# Patient Record
Sex: Female | Born: 1955 | Race: Black or African American | Hispanic: No | Marital: Married | Smoking: Light tobacco smoker
Health system: Southern US, Community
[De-identification: ages and names within clinical notes are randomized; demographics above are authoritative.]

## PROBLEM LIST (undated history)

## (undated) DIAGNOSIS — D649 Anemia, unspecified: Secondary | ICD-10-CM

## (undated) DIAGNOSIS — I5189 Other ill-defined heart diseases: Secondary | ICD-10-CM

## (undated) DIAGNOSIS — F32A Depression, unspecified: Secondary | ICD-10-CM

## (undated) DIAGNOSIS — K760 Fatty (change of) liver, not elsewhere classified: Secondary | ICD-10-CM

## (undated) DIAGNOSIS — I350 Nonrheumatic aortic (valve) stenosis: Secondary | ICD-10-CM

## (undated) DIAGNOSIS — E785 Hyperlipidemia, unspecified: Secondary | ICD-10-CM

## (undated) DIAGNOSIS — I493 Ventricular premature depolarization: Secondary | ICD-10-CM

## (undated) DIAGNOSIS — K219 Gastro-esophageal reflux disease without esophagitis: Secondary | ICD-10-CM

## (undated) DIAGNOSIS — F329 Major depressive disorder, single episode, unspecified: Secondary | ICD-10-CM

## (undated) DIAGNOSIS — Z720 Tobacco use: Secondary | ICD-10-CM

## (undated) DIAGNOSIS — I10 Essential (primary) hypertension: Secondary | ICD-10-CM

## (undated) DIAGNOSIS — M1990 Unspecified osteoarthritis, unspecified site: Secondary | ICD-10-CM

## (undated) DIAGNOSIS — I351 Nonrheumatic aortic (valve) insufficiency: Secondary | ICD-10-CM

## (undated) DIAGNOSIS — E079 Disorder of thyroid, unspecified: Secondary | ICD-10-CM

## (undated) DIAGNOSIS — Z800 Family history of malignant neoplasm of digestive organs: Secondary | ICD-10-CM

## (undated) DIAGNOSIS — I491 Atrial premature depolarization: Secondary | ICD-10-CM

## (undated) DIAGNOSIS — G459 Transient cerebral ischemic attack, unspecified: Secondary | ICD-10-CM

## (undated) HISTORY — DX: Essential (primary) hypertension: I10

## (undated) HISTORY — DX: Hyperlipidemia, unspecified: E78.5

## (undated) HISTORY — DX: Depression, unspecified: F32.A

## (undated) HISTORY — DX: Family history of malignant neoplasm of digestive organs: Z80.0

## (undated) HISTORY — PX: FOOT SURGERY: SHX648

## (undated) HISTORY — DX: Transient cerebral ischemic attack, unspecified: G45.9

## (undated) HISTORY — DX: Disorder of thyroid, unspecified: E07.9

## (undated) HISTORY — DX: Unspecified osteoarthritis, unspecified site: M19.90

## (undated) HISTORY — DX: Major depressive disorder, single episode, unspecified: F32.9

## (undated) HISTORY — DX: Gastro-esophageal reflux disease without esophagitis: K21.9

## (undated) HISTORY — DX: Anemia, unspecified: D64.9

## (undated) HISTORY — PX: DILATION AND CURETTAGE OF UTERUS: SHX78

---

## 2010-09-13 ENCOUNTER — Emergency Department (HOSPITAL_COMMUNITY): Admission: EM | Admit: 2010-09-13 | Discharge: 2010-09-13 | Payer: Self-pay | Admitting: Family Medicine

## 2011-07-25 ENCOUNTER — Encounter: Payer: Self-pay | Admitting: Internal Medicine

## 2011-07-25 ENCOUNTER — Ambulatory Visit (INDEPENDENT_AMBULATORY_CARE_PROVIDER_SITE_OTHER): Payer: Self-pay | Admitting: Internal Medicine

## 2011-07-25 DIAGNOSIS — K219 Gastro-esophageal reflux disease without esophagitis: Secondary | ICD-10-CM | POA: Insufficient documentation

## 2011-07-25 DIAGNOSIS — Z23 Encounter for immunization: Secondary | ICD-10-CM

## 2011-07-25 DIAGNOSIS — M542 Cervicalgia: Secondary | ICD-10-CM

## 2011-07-25 DIAGNOSIS — F329 Major depressive disorder, single episode, unspecified: Secondary | ICD-10-CM | POA: Insufficient documentation

## 2011-07-25 DIAGNOSIS — F4321 Adjustment disorder with depressed mood: Secondary | ICD-10-CM | POA: Insufficient documentation

## 2011-07-25 DIAGNOSIS — R05 Cough: Secondary | ICD-10-CM

## 2011-07-25 DIAGNOSIS — I10 Essential (primary) hypertension: Secondary | ICD-10-CM

## 2011-07-25 DIAGNOSIS — F17200 Nicotine dependence, unspecified, uncomplicated: Secondary | ICD-10-CM

## 2011-07-25 DIAGNOSIS — E785 Hyperlipidemia, unspecified: Secondary | ICD-10-CM | POA: Insufficient documentation

## 2011-07-25 DIAGNOSIS — Z720 Tobacco use: Secondary | ICD-10-CM

## 2011-07-25 DIAGNOSIS — F4329 Adjustment disorder with other symptoms: Secondary | ICD-10-CM | POA: Insufficient documentation

## 2011-07-25 LAB — LIPID PANEL
HDL: 37 mg/dL — ABNORMAL LOW (ref >39)
LDL Cholesterol: 89 mg/dL (ref 0–99)
Total CHOL/HDL Ratio: 3.8 Ratio
Triglycerides: 65 mg/dL (ref <150)
VLDL: 13 mg/dL (ref 0–40)

## 2011-07-25 LAB — CBC
HCT: 42.3 % (ref 36.0–46.0)
Hemoglobin: 13.4 g/dL (ref 12.0–15.0)
MCH: 28.8 pg (ref 26.0–34.0)
MCHC: 31.7 g/dL (ref 30.0–36.0)
MCV: 91 fL (ref 78.0–100.0)

## 2011-07-25 NOTE — Assessment & Plan Note (Signed)
New to the clinic. Continue to monitor.  BP Readings from Last 3 Encounters:  07/25/11 128/89    Assessment: Hypertension control:  controlled  Progress toward goals:  at goal Barriers to meeting goals:  no barriers identified  Plan: Hypertension treatment:  continue current medications

## 2011-07-25 NOTE — Assessment & Plan Note (Signed)
Stable.  Continue current treatment

## 2011-07-25 NOTE — Progress Notes (Signed)
  Subjective:    Patient ID: Emma Bonilla, female    DOB: 02-Sep-1956, 55 y.o.   MRN: [id number]  HPI: 55 year old woman with past medical history significant for hypertension, depression is a new patient to the clinic.  She has been doing back and forth from [place] to [place] for last one year and now wants to establish her care, here in [place].  Today she complains of some pain in the neck, located bilaterally symmetrical along the lines of sternocleidomastoid. The pain started around a week ago and she rates her pain 5-6/10. No aggaravating or relieving factors.  She also reports some cough where she is bringing some brownish phlegm. She's not sure if that was blood. She is a current smoker.    Review of Systems  Constitutional: Negative for fever, diaphoresis and fatigue.  HENT: Negative for sore throat, drooling, mouth sores, trouble swallowing, dental problem, postnasal drip and sinus pressure.   Eyes: Negative for visual disturbance.  Respiratory: Positive for cough. Negative for apnea, choking, chest tightness and shortness of breath.   Cardiovascular: Negative for chest pain, palpitations and leg swelling.  Gastrointestinal: Negative for vomiting, abdominal pain, diarrhea and constipation.  Genitourinary: Negative for dysuria, urgency and hematuria.  Musculoskeletal: Negative for back pain and arthralgias.  Neurological: Negative for dizziness and numbness.  Hematological: Negative for adenopathy.  Psychiatric/Behavioral: Negative for agitation.       Objective:   Physical Exam  Constitutional: She is oriented to person, place, and time. She appears well-developed and well-nourished. No distress.  HENT:  Head: Normocephalic and atraumatic.  Mouth/Throat: No oropharyngeal exudate.  Eyes: Conjunctivae and EOM are normal. Pupils are equal, round, and reactive to light.  Neck: Normal range of motion. Neck supple. No JVD present. No tracheal deviation present. No  thyromegaly present.  Cardiovascular: Normal rate, regular rhythm and intact distal pulses.  Exam reveals no gallop and no friction rub.   No murmur heard. Pulmonary/Chest: Effort normal and breath sounds normal. No stridor. No respiratory distress. She has no wheezes. She has no rales. She exhibits no tenderness.  Abdominal: Soft. Bowel sounds are normal. She exhibits no distension and no mass. There is no tenderness. There is no rebound and no guarding.  Musculoskeletal: Normal range of motion. She exhibits no edema and no tenderness.  Lymphadenopathy:    She has no cervical adenopathy.  Neurological: She is alert and oriented to person, place, and time. She has normal reflexes. She displays normal reflexes. No cranial nerve deficit. She exhibits normal muscle tone. Coordination normal.  Skin: Skin is warm. She is not diaphoretic.          Assessment & Plan:

## 2011-07-25 NOTE — Assessment & Plan Note (Signed)
He complains of bilateral leg pain in sternocleidomastoid distribution for one week. I think her pain is most likely musculoskeletal. She was advised to take when necessary pain medications for now.

## 2011-07-25 NOTE — Assessment & Plan Note (Signed)
She was counseled on smoking cessation. Not ready at this time. Will continue that with subsequent visits.

## 2011-07-25 NOTE — Patient Instructions (Signed)
Please schedule a follow up appointment in 1- 2 months. Please take your medicines as prescribed.

## 2011-07-25 NOTE — Assessment & Plan Note (Signed)
She reports having cough with brownish phlegm for last one week. Cannot tell for sure if it was blood. Again this is most likely smoker's cough but  maybe she got some mucosal injury from coughing too hard. She was advised to call the clinic if her symptoms persist or if she notices frank hemoptysis.

## 2011-07-25 NOTE — Assessment & Plan Note (Signed)
Check FLP and CMP

## 2011-07-25 NOTE — Assessment & Plan Note (Signed)
Stable  Continue current regimen  

## 2011-07-26 LAB — COMPLETE METABOLIC PANEL WITH GFR
ALT: 10 U/L (ref 0–35)
AST: 13 U/L (ref 0–37)
Alkaline Phosphatase: 89 U/L (ref 39–117)
CO2: 28 mEq/L (ref 19–32)
Creat: 1.19 mg/dL — ABNORMAL HIGH (ref 0.50–1.10)
Sodium: 142 mEq/L (ref 135–145)
Total Bilirubin: 0.4 mg/dL (ref 0.3–1.2)
Total Protein: 7 g/dL (ref 6.0–8.3)

## 2011-09-01 ENCOUNTER — Ambulatory Visit (INDEPENDENT_AMBULATORY_CARE_PROVIDER_SITE_OTHER): Payer: Self-pay | Admitting: Internal Medicine

## 2011-09-01 ENCOUNTER — Encounter: Payer: Self-pay | Admitting: Internal Medicine

## 2011-09-01 DIAGNOSIS — I10 Essential (primary) hypertension: Secondary | ICD-10-CM

## 2011-09-01 DIAGNOSIS — Z1231 Encounter for screening mammogram for malignant neoplasm of breast: Secondary | ICD-10-CM

## 2011-09-01 DIAGNOSIS — R51 Headache: Secondary | ICD-10-CM

## 2011-09-01 DIAGNOSIS — M1990 Unspecified osteoarthritis, unspecified site: Secondary | ICD-10-CM

## 2011-09-01 DIAGNOSIS — E785 Hyperlipidemia, unspecified: Secondary | ICD-10-CM

## 2011-09-01 DIAGNOSIS — F329 Major depressive disorder, single episode, unspecified: Secondary | ICD-10-CM

## 2011-09-01 DIAGNOSIS — Z7902 Long term (current) use of antithrombotics/antiplatelets: Secondary | ICD-10-CM | POA: Insufficient documentation

## 2011-09-01 DIAGNOSIS — E039 Hypothyroidism, unspecified: Secondary | ICD-10-CM

## 2011-09-01 DIAGNOSIS — K219 Gastro-esophageal reflux disease without esophagitis: Secondary | ICD-10-CM

## 2011-09-01 DIAGNOSIS — Z0000 Encounter for general adult medical examination without abnormal findings: Secondary | ICD-10-CM

## 2011-09-01 MED ORDER — LISINOPRIL-HYDROCHLOROTHIAZIDE 20-25 MG PO TABS: 1.0000 | ORAL_TABLET | Freq: Every day | ORAL | Status: DC

## 2011-09-01 MED ORDER — OMEPRAZOLE 40 MG PO CPDR: 40.0000 mg | DELAYED_RELEASE_CAPSULE | Freq: Every day | ORAL | Status: DC

## 2011-09-01 MED ORDER — ASPIRIN 81 MG PO TABS: 81.0000 mg | ORAL_TABLET | Freq: Every day | ORAL | Status: DC

## 2011-09-01 MED ORDER — PRAVASTATIN SODIUM 40 MG PO TABS: 40.0000 mg | ORAL_TABLET | Freq: Every day | ORAL | Status: DC

## 2011-09-01 MED ORDER — BUPROPION HCL ER (SR) 150 MG PO TB12: 150.0000 mg | ORAL_TABLET | Freq: Two times a day (BID) | ORAL | Status: DC

## 2011-09-01 MED ORDER — LEVOTHYROXINE SODIUM 100 MCG PO CAPS: 1.0000 | ORAL_CAPSULE | Freq: Every day | ORAL | Status: DC

## 2011-09-01 MED ORDER — ETODOLAC 400 MG PO TABS: 400.0000 mg | ORAL_TABLET | Freq: Two times a day (BID) | ORAL | Status: DC

## 2011-09-01 MED ORDER — CALCIUM CARBONATE-VITAMIN D 500-200 MG-UNIT PO TABS: 1.0000 | ORAL_TABLET | Freq: Every day | ORAL | Status: DC

## 2011-09-01 NOTE — Assessment & Plan Note (Signed)
Stable.  Continue Wellbutrin. 

## 2011-09-01 NOTE — Assessment & Plan Note (Signed)
She got her colonoscopy report that was scanned and entered in to the system. She has also had a pap smear at [facility]. Will try to retrieve records. Will refer her for mammogram today.

## 2011-09-01 NOTE — Assessment & Plan Note (Signed)
Will switch her from Nexium to Prilosec because of cost.

## 2011-09-01 NOTE — Assessment & Plan Note (Signed)
Will switch her from crestor to pravastatin because of cost.

## 2011-09-01 NOTE — Progress Notes (Signed)
  Subjective:    Patient ID: Emma Bonilla, female    DOB: [birth year], 55 y.o.   MRN: [id number]  HPI: 55 year old woman with past medical history significant for diabetes, hypertension, depression comes to the clinic for followup visit.   She reports headaches which she describes as typical for her migraines. She rates her headaches 5 /10 , throbbing in nature, occurs at a frequency of one to 2 times/ a week and usually lasts for about 36-48 hours, headache gets better dark room.  She has been taking Tylenol for it but that doesn't help her. She states that she was prescribed a medication starting with 'M' from her previous clinic in [place] that helped her.  She denies any vision changes, dizziness , nausea or vomiting associated with it.   She states that she has been trying to quit smoking and Wellbutrin is helping her. She currently smokes 2 cigarettes per day.  She has been diagnosed with carpal tunnel in the past. She reports pain and tingling in the first 3 digits of her right hand.  She is requesting for some medication refill. She was requesting for some medication samples if they are available.        Review of Systems  Constitutional: Negative for fever, chills, diaphoresis, activity change and appetite change.  HENT: Negative for ear pain, nosebleeds, congestion, rhinorrhea, sneezing, neck pain, neck stiffness and postnasal drip.   Eyes: Positive for photophobia. Negative for pain, redness and visual disturbance.  Respiratory: Negative for apnea, cough, choking, chest tightness, shortness of breath and stridor.   Cardiovascular: Negative for chest pain, palpitations and leg swelling.  Gastrointestinal: Negative for nausea, abdominal pain, constipation and blood in stool.  Genitourinary: Negative for dysuria, urgency and flank pain.  Musculoskeletal: Negative for arthralgias.  Skin: Negative for color change, pallor, rash and wound.  Neurological: Positive for headaches.  Negative for syncope, facial asymmetry, light-headedness and numbness.  Hematological: Negative for adenopathy.  Psychiatric/Behavioral: Negative for agitation.       Objective:   Physical Exam  Constitutional: She is oriented to person, place, and time. She appears well-developed and well-nourished. No distress.  HENT:  Head: Normocephalic and atraumatic.  Mouth/Throat: No oropharyngeal exudate.  Eyes: Conjunctivae and EOM are normal. Pupils are equal, round, and reactive to light. Right eye exhibits no discharge. Left eye exhibits no discharge. No scleral icterus.  Neck: Normal range of motion. Neck supple. No JVD present. No tracheal deviation present. No thyromegaly present.  Cardiovascular: Normal rate, regular rhythm, normal heart sounds and intact distal pulses.  Exam reveals no gallop and no friction rub.   No murmur heard. Pulmonary/Chest: Effort normal and breath sounds normal. No stridor. No respiratory distress. She has no wheezes. She has no rales. She exhibits no tenderness.  Abdominal: Soft. Bowel sounds are normal. She exhibits no distension and no mass. There is no tenderness. There is no rebound and no guarding.  Musculoskeletal: Normal range of motion. She exhibits no edema and no tenderness.  Lymphadenopathy:    She has no cervical adenopathy.  Neurological: She is alert and oriented to person, place, and time. She has normal reflexes. She displays normal reflexes. No cranial nerve deficit. She exhibits normal muscle tone. Coordination normal.  Skin: Skin is warm. She is not diaphoretic.          Assessment & Plan:

## 2011-09-01 NOTE — Assessment & Plan Note (Signed)
Lab Results  Component Value Date   NA 142 07/25/2011   K 4.2 07/25/2011   CL 103 07/25/2011   CO2 28 07/25/2011   BUN 18 07/25/2011   CREATININE 1.19* 07/25/2011    BP Readings from Last 3 Encounters:  09/01/11 130/70  07/25/11 128/89    Assessment: Hypertension control:  controlled  Progress toward goals:  at goal Barriers to meeting goals:  no barriers identified  Plan: Hypertension treatment:  continue current medications

## 2011-09-01 NOTE — Patient Instructions (Signed)
Please take your medicines as prescribed. Please schedule a follow up appointment in 3 months. Please bring your medication bottles with you with your next appointment.

## 2011-09-09 ENCOUNTER — Encounter (HOSPITAL_COMMUNITY): Payer: Self-pay | Admitting: *Deleted

## 2011-09-09 ENCOUNTER — Inpatient Hospital Stay (HOSPITAL_COMMUNITY)
Admission: AD | Admit: 2011-09-09 | Discharge: 2011-09-09 | Disposition: A | Discharge status: Home / Self Care | Payer: Self-pay | Source: Ambulatory Visit | Attending: Family Medicine | Admitting: Family Medicine

## 2011-09-09 DIAGNOSIS — L02219 Cutaneous abscess of trunk, unspecified: Secondary | ICD-10-CM | POA: Insufficient documentation

## 2011-09-09 DIAGNOSIS — L0291 Cutaneous abscess, unspecified: Secondary | ICD-10-CM

## 2011-09-09 MED ORDER — SULFAMETHOXAZOLE-TRIMETHOPRIM 800-160 MG PO TABS: 1.0000 | ORAL_TABLET | Freq: Two times a day (BID) | ORAL | Status: AC

## 2011-09-09 MED ORDER — OXYCODONE-ACETAMINOPHEN 5-325 MG PO TABS: 1.0000 | ORAL_TABLET | ORAL | Status: AC | PRN

## 2011-09-09 NOTE — ED Provider Notes (Signed)
Lars Pinks [id number] Chief Complaint  Patient presents with  . Abscess    SUBJECTIVE  HPI: 1 wk hx of boil over pubic bone. Getting larger and more painful. Drained small amount of yellow brown material yesterday. No other lesions. Tried warm soaks, no antipyretics or analgesics. Denies fever/chills, malaise.  No hx DM and receives regular primary care.   Past Medical History  Diagnosis Date  . Hyperlipidemia   . Hypertension   . Arthritis   . Thyroid disease   . Depression   . GERD (gastroesophageal reflux disease)   . TIA (transient ischemic attack)     2010  . Heart murmur     Gyn Hx: neg STIs Past Surgical History  Procedure Date  . Dilation and curettage of uterus     30 years ago   History   Social History  . Marital Status: Single    Spouse Name: N/A    Number of Children: N/A  . Years of Education: N/A   Occupational History  . Not on file.   Social History Main Topics  . Smoking status: Current Everyday Smoker -- 0.5 packs/day for 45 years    Types: Cigarettes  . Smokeless tobacco: Not on file  . Alcohol Use: Not on file     occasional  . Drug Use: Yes    Special: Marijuana     occasional, last 3 months ago  . Sexually Active: Yes    Birth Control/ Protection: None   Other Topics Concern  . Not on file   Social History Narrative  . No narrative on file   No current facility-administered medications on file prior to encounter.   Current Outpatient Prescriptions on File Prior to Encounter  Medication Sig Dispense Refill  . aspirin 81 MG tablet Take 1 tablet (81 mg total) by mouth daily.  30 tablet  3  . Budesonide (RHINOCORT AQUA NA) Place into the nose.        Marland Kitchen buPROPion (WELLBUTRIN SR) 150 MG 12 hr tablet Take 1 tablet (150 mg total) by mouth 2 (two) times daily.  60 tablet  3  . calcium-vitamin D (OSCAL WITH D) 500-200 MG-UNIT per tablet Take 1 tablet by mouth daily.  30 tablet  3  . diphenhydrAMINE (BENADRYL) 25 MG tablet Take 25 mg  by mouth at bedtime as needed.        . etodolac (LODINE) 400 MG tablet Take 1 tablet (400 mg total) by mouth 2 (two) times daily.  60 tablet  2  . Levothyroxine Sodium 100 MCG CAPS Take 1 capsule (100 mcg total) by mouth daily.  30 capsule  3  . lisinopril-hydrochlorothiazide (PRINZIDE,ZESTORETIC) 20-25 MG per tablet Take 1 tablet by mouth daily.  30 tablet  3  . omeprazole (PRILOSEC) 40 MG capsule Take 1 capsule (40 mg total) by mouth daily.  30 capsule  3  . pravastatin (PRAVACHOL) 40 MG tablet Take 1 tablet (40 mg total) by mouth daily.  30 tablet  3   Allergies  Allergen Reactions  . Advil Rash  . All Day Pain Rash    ROS: Pertinent items in HPI  OBJECTIVE  BP 132/67  Pulse 84  Temp(Src) 98.7 F (37.1 C) (Oral)  Resp 20  Ht 5\' 7"  (1.702 m)  Wt 114.034 kg (251 lb 6.4 oz)  BMI 39.37 kg/m2  LMP 02/11/2011   Physical Exam:  General: WN/WD, pleasant in NAD Abd: Soft obese, NT Mons pubis: rt side over mons is  6 cm raised induration with redness extending about 3-4 cm beyond borders, no streaking. scaey area near center but not draining now with pressure applied to lesion.  Groin: tender shotty lymphadenopathy on rt   ASSESSMENT Large furuncle nons pubis with early cellulitis     PLAN  Discussed with Dr. Macon Large. Rx Bactrim DS bid x 1 wk. F/U with primary MD if not improved after 1 week or if unimproved or febrile after abx.

## 2011-09-09 NOTE — Progress Notes (Addendum)
First noted bump coming up on Thurs.  Big on Fri.  Did soak last night.  Started leaking after the soak, labial groin area on rt. Never had before.

## 2011-09-11 NOTE — ED Provider Notes (Signed)
Attestation of Attending Supervision of Advanced Practitioner: Evaluation and management procedures were performed by the PA/NP/CNM/OB Fellow under my supervision/collaboration. Chart reviewed and agree with management and plan.  ANYANWU,UGONNA A 09/11/2011 10:23 AM

## 2011-09-12 ENCOUNTER — Ambulatory Visit (HOSPITAL_COMMUNITY)
Admission: RE | Admit: 2011-09-12 | Discharge: 2011-09-12 | Disposition: A | Discharge status: Home / Self Care | Payer: Self-pay | Source: Ambulatory Visit | Attending: Internal Medicine | Admitting: Internal Medicine

## 2011-09-12 DIAGNOSIS — Z1231 Encounter for screening mammogram for malignant neoplasm of breast: Secondary | ICD-10-CM | POA: Insufficient documentation

## 2011-09-15 DIAGNOSIS — R51 Headache: Secondary | ICD-10-CM | POA: Insufficient documentation

## 2011-09-15 DIAGNOSIS — R519 Headache, unspecified: Secondary | ICD-10-CM | POA: Insufficient documentation

## 2011-09-16 NOTE — Assessment & Plan Note (Signed)
The description for her headaches is typical for migraine. She was offered prophylactic medications like Topamax or Propranolol but she refused. She mentioned taking medicine starting with 'M' for her headaches. After discussing her case with Dr. Coralee Pesa,  it sounds like she might be taking Midrin for breakthrough pain. - We may consider starting her on that medication for breakthrough pain, if she continues to have headaches with future appointments. - Will try retrieving her old records.

## 2011-10-02 ENCOUNTER — Encounter: Payer: Self-pay | Admitting: *Deleted

## 2011-10-02 ENCOUNTER — Telehealth: Payer: Self-pay | Admitting: *Deleted

## 2011-10-02 DIAGNOSIS — K219 Gastro-esophageal reflux disease without esophagitis: Secondary | ICD-10-CM

## 2011-10-02 DIAGNOSIS — I10 Essential (primary) hypertension: Secondary | ICD-10-CM

## 2011-10-02 DIAGNOSIS — E039 Hypothyroidism, unspecified: Secondary | ICD-10-CM

## 2011-10-02 DIAGNOSIS — J302 Other seasonal allergic rhinitis: Secondary | ICD-10-CM

## 2011-10-02 DIAGNOSIS — E785 Hyperlipidemia, unspecified: Secondary | ICD-10-CM

## 2011-10-02 MED ORDER — LEVOTHYROXINE SODIUM 100 MCG PO TABS: 100.0000 ug | ORAL_TABLET | Freq: Every day | ORAL | Status: DC

## 2011-10-02 MED ORDER — ROSUVASTATIN CALCIUM 5 MG PO TABS: 5.0000 mg | ORAL_TABLET | Freq: Every day | ORAL | Status: DC

## 2011-10-02 MED ORDER — QUINAPRIL-HYDROCHLOROTHIAZIDE 20-25 MG PO TABS: 1.0000 | ORAL_TABLET | Freq: Every day | ORAL | Status: DC

## 2011-10-02 MED ORDER — ESOMEPRAZOLE MAGNESIUM 40 MG PO CPDR: 40.0000 mg | DELAYED_RELEASE_CAPSULE | Freq: Every day | ORAL | Status: DC

## 2011-10-02 MED ORDER — DESLORATADINE 5 MG PO TABS: 5.0000 mg | ORAL_TABLET | Freq: Every day | ORAL | Status: DC

## 2011-10-02 NOTE — Progress Notes (Signed)
Pt presents c/o not being able to get her meds from pharmacy, [facility], i called crystal at [facility] she has refaxed the paperwork and i will attach requests for changes. i told pt i will call her as soon as i receive an answer from md. She is agreeable

## 2011-10-02 NOTE — Telephone Encounter (Signed)
Called to pharm 

## 2011-10-02 NOTE — Telephone Encounter (Signed)
I will discuss her anti- depressant regimen when she would follow up in the clinic with me.  I would advise her to use saline nasal spray first and give [place] a call back if that does not help.

## 2011-10-02 NOTE — Telephone Encounter (Signed)
Pt is using [facility], need changes on some meds... 1) pravastatin- change to crestor 5mg  every hs   2) omeprazole- change to nexium 40 mg daily  3) lisinopril/ hctz- change to accuretic  4) levothyroxine- change to synthroid, may pt change from generic to brand for this narrow therapeutic index drug?  5) wellbutrin- can we possibly use zoloft, prozac, cymbalta, venlafaxine cxr ( effexor)  these are free  6) pt also request rhinocort aqua nasal spray, she has some from her old pcp and would like to continue  7) she also would like a antihistamine, they carry clarinex  Pt also stated she had a TIA 12/2008, do you want to continue lodine, pt requested celebrex but due to the stated TIA ... ???  Please advise

## 2011-10-15 ENCOUNTER — Telehealth: Payer: Self-pay | Admitting: *Deleted

## 2011-10-15 MED ORDER — BUDESONIDE 32 MCG/ACT NA SUSP: 2.0000 | Freq: Every day | NASAL | Status: DC

## 2011-10-15 NOTE — Telephone Encounter (Signed)
Refill request from [facility]: Rhinocort Aqua - Need directions  Qty 2 inhalers.  Pt was called to find more details  But had to leave a message.

## 2011-10-15 NOTE — Telephone Encounter (Signed)
Rhinocort NS rx called to [facility].

## 2011-10-28 ENCOUNTER — Telehealth: Payer: Self-pay | Admitting: *Deleted

## 2011-10-28 NOTE — Telephone Encounter (Signed)
Pt reports suffering a prior TIA in her prior place of residence, would she even need to be on either? Please advise

## 2011-10-29 NOTE — Telephone Encounter (Signed)
I tried calling the patient but could not reach her.  Thanks, [employer]

## 2011-10-29 NOTE — Telephone Encounter (Signed)
Called [facility] and informed them no lodine and no celebrex, dr Dorthula Rue has tried to call pt.

## 2011-10-29 NOTE — Telephone Encounter (Signed)
i am sorry i should have said she is on lodine and would like to change to celebrex but due to her stated TIA hx should she be on either, please advise

## 2011-11-03 ENCOUNTER — Other Ambulatory Visit: Payer: Self-pay | Admitting: *Deleted

## 2011-11-03 DIAGNOSIS — J302 Other seasonal allergic rhinitis: Secondary | ICD-10-CM

## 2011-11-04 MED ORDER — DESLORATADINE 5 MG PO TABS: 5.0000 mg | ORAL_TABLET | Freq: Every day | ORAL | Status: DC

## 2011-11-04 NOTE — Telephone Encounter (Signed)
Refill faxed to the [facility].  Angelina Ok, RN 11/04/2011 4:24 PM.

## 2011-11-07 ENCOUNTER — Encounter: Payer: Self-pay | Admitting: Internal Medicine

## 2011-11-07 ENCOUNTER — Ambulatory Visit (INDEPENDENT_AMBULATORY_CARE_PROVIDER_SITE_OTHER): Payer: Self-pay | Admitting: Internal Medicine

## 2011-11-07 VITALS — BP 138/94 | HR 84 | Temp 97.2°F | Ht 67.0 in | Wt 258.3 lb

## 2011-11-07 DIAGNOSIS — I10 Essential (primary) hypertension: Secondary | ICD-10-CM

## 2011-11-07 DIAGNOSIS — R51 Headache: Secondary | ICD-10-CM

## 2011-11-07 DIAGNOSIS — J309 Allergic rhinitis, unspecified: Secondary | ICD-10-CM

## 2011-11-07 DIAGNOSIS — M1990 Unspecified osteoarthritis, unspecified site: Secondary | ICD-10-CM | POA: Insufficient documentation

## 2011-11-07 DIAGNOSIS — J302 Other seasonal allergic rhinitis: Secondary | ICD-10-CM

## 2011-11-07 MED ORDER — TRAMADOL HCL 50 MG PO TBDP: 50.0000 mg | ORAL_TABLET | Freq: Two times a day (BID) | ORAL | Status: DC

## 2011-11-07 MED ORDER — DESLORATADINE 5 MG PO TABS: 5.0000 mg | ORAL_TABLET | Freq: Every day | ORAL | Status: DC

## 2011-11-07 MED ORDER — AMITRIPTYLINE HCL 25 MG PO TABS: 25.0000 mg | ORAL_TABLET | Freq: Every day | ORAL | Status: DC

## 2011-11-07 NOTE — Progress Notes (Signed)
Subjective:   Patient ID: [name] female   DOB: 09/22/1956 55 y.o.   MRN: [id number]  HPI: [name] is a 55 y.o. woman who presents to clinic today complaining of migraine headaches for years.  She used to live in [place] and at that time was given Midrin for her headaches.  She was switched to Etodolac but hasn't been able to get it from the [facility] since moving here.  She states that she was told that they have Celebrex but wanted to speak with the doctor before they switched to it.  She denies changes in the headaches, neck stiffness, fever, chills, or confusion.  She does have some nausea and light sensitivity.    She has a PMH significant for TIA several years ago, osteroarthritis, hypertension, and hyperlipidemia.  She states that she has been  Using tylenol for the joint pain after running out of her Etodolac.  She states that with her headaches she doesn't sleep very well at night.    Past Medical History  Diagnosis Date  . Hyperlipidemia   . Hypertension   . Arthritis   . Thyroid disease   . Depression   . GERD (gastroesophageal reflux disease)   . TIA (transient ischemic attack)     2010  . Heart murmur    Current Outpatient Prescriptions  Medication Sig Dispense Refill  . aspirin 81 MG tablet Take 1 tablet (81 mg total) by mouth daily.  30 tablet  3  . budesonide (RHINOCORT AQUA) 32 MCG/ACT nasal spray Place 2 sprays into the nose daily.  1 Bottle  2  . buPROPion (WELLBUTRIN SR) 150 MG 12 hr tablet Take 1 tablet (150 mg total) by mouth 2 (two) times daily.  60 tablet  3  . calcium-vitamin D (OSCAL WITH D) 500-200 MG-UNIT per tablet Take 1 tablet by mouth daily.  30 tablet  3  . desloratadine (CLARINEX) 5 MG tablet Take 1 tablet (5 mg total) by mouth daily.  30 tablet  0  . diphenhydrAMINE (BENADRYL) 25 MG tablet Take 25 mg by mouth at bedtime as needed.        Marland Kitchen esomeprazole (NEXIUM) 40 MG capsule Take 1 capsule (40 mg total) by mouth daily before  breakfast.  30 capsule  3  . etodolac (LODINE) 400 MG tablet Take 1 tablet (400 mg total) by mouth 2 (two) times daily.  60 tablet  2  . levothyroxine (SYNTHROID) 100 MCG tablet Take 1 tablet (100 mcg total) by mouth daily.  30 tablet  3  . quinapril-hydrochlorothiazide (ACCURETIC) 20-25 MG per tablet Take 1 tablet by mouth daily.  30 tablet  3  . rosuvastatin (CRESTOR) 5 MG tablet Take 1 tablet (5 mg total) by mouth at bedtime.  30 tablet  3   Family History  Problem Relation Age of Onset  . Heart disease Mother   . Hypertension Mother   . Heart disease Father   . Hypertension Father   . Heart disease Sister   . Hypertension Sister   . Diabetes Sister   . Diabetes Brother    History   Social History  . Marital Status: Married    Spouse Name: N/A    Number of Children: N/A  . Years of Education: N/A   Social History Main Topics  . Smoking status: Current Everyday Smoker -- 0.5 packs/day for 45 years    Types: Cigarettes  . Smokeless tobacco: None  . Alcohol Use: None  occasional  . Drug Use: Yes    Special: Marijuana     occasional, last 3 months ago  . Sexually Active: Yes    Birth Control/ Protection: None   Other Topics Concern  . None   Social History Narrative  . None   Review of Systems: Constitutional: Denies fever, chills, diaphoresis, appetite change and fatigue.  HEENT: Denies photophobia, eye pain, redness, hearing loss, ear pain, congestion, sore throat, rhinorrhea, sneezing, mouth sores, trouble swallowing, neck pain, neck stiffness and tinnitus.   Respiratory: Denies SOB, DOE, cough, chest tightness,  and wheezing.   Cardiovascular: Denies chest pain, palpitations and leg swelling.  Gastrointestinal: Denies nausea, vomiting, abdominal pain, diarrhea, constipation, blood in stool and abdominal distention.  Genitourinary: Denies dysuria, urgency, frequency, hematuria, flank pain and difficulty urinating.  Musculoskeletal: Denies myalgias, back pain,  joint swelling, arthralgias and gait problem.  Skin: Denies pallor, rash and wound.  Neurological: Positive for headaches.  Denies dizziness, seizures, syncope, weakness, light-headedness, numbness.  Hematological: Denies adenopathy. Easy bruising, personal or family bleeding history  Psychiatric/Behavioral: Denies suicidal ideation, mood changes, confusion, nervousness, sleep disturbance and agitation  Objective:  Physical Exam: Filed Vitals:   11/07/11 1609  BP: 138/94  Pulse: 84  Temp: 97.2 F (36.2 C)  TempSrc: Oral  Height: 5\' 7"  (1.702 m)  Weight: 258 lb 4.8 oz (117.164 kg)   Constitutional: Vital signs reviewed.  Patient is a well-developed and well-nourished woman in no acute distress and cooperative with exam. Alert and oriented x3.  Head: Normocephalic and atraumatic Ear: TM normal bilaterally Mouth: no erythema or exudates, MMM Eyes: PERRL, EOMI, conjunctivae normal, No scleral icterus. some aversion when checking pupil dilation due to the light sensitivity.  Neck: Supple, Trachea midline normal ROM, No JVD, mass, thyromegaly, or carotid bruit present.  Cardiovascular: RRR, S1 normal, S2 normal, no MRG, pulses symmetric and intact bilaterally Pulmonary/Chest: CTAB, no wheezes, rales, or rhonchi Abdominal: Soft. Non-tender, non-distended, bowel sounds are normal, no masses, organomegaly, or guarding present.  GU: no CVA tenderness Musculoskeletal: No joint deformities, erythema, or stiffness, ROM full and no nontender Hematology: no cervical, inginal, or axillary adenopathy.  Neurological: A&O x3, Strenght is normal and symmetric bilaterally, cranial nerve II-XII are grossly intact, no focal motor deficit, sensory intact to light touch bilaterally.  Skin: Warm, dry and intact. No rash, cyanosis, or clubbing.  Psychiatric: Normal mood and affect. speech and behavior is normal. Judgment and thought content normal. Cognition and memory are normal.   Assessment & Plan:

## 2011-11-07 NOTE — Patient Instructions (Signed)
1.  Start the Amitriptyline 25 mg tablets take 1 tablet each night before bed.  2.  Use the Tramadol or the Tylenol for the headaches in the mean time.  3.  Continue all of your other medications as prescribed.  4.  Follow up in 2 weeks to see how you are doing.

## 2011-11-13 NOTE — Assessment & Plan Note (Signed)
She states that she has a history of osteoarthritis.  Exam today revealed no joint abnormalities.  We will continue to monitor and I encouraged her to stay active to help her maintain her flexibility and strength.

## 2011-11-13 NOTE — Assessment & Plan Note (Signed)
She is having continual headaches and has been on several episodic treatments in the past.  We discussed today that we need to consider a prophylactic medication to keep her from having headaches instead of treating them when they come.  With her sleep disturbance we will start Amitriptyline 25 mg Qhs for now.  She will likely need to increase it slowly but we will start low.  She was also given a small amount of tramadol to help with the headache and told that she could continue to take the tylenol to help relieve the pain.

## 2011-11-13 NOTE — Assessment & Plan Note (Signed)
Lab Results  Component Value Date   NA 142 07/25/2011   K 4.2 07/25/2011   CL 103 07/25/2011   CO2 28 07/25/2011   BUN 18 07/25/2011   CREATININE 1.19* 07/25/2011    BP Readings from Last 3 Encounters:  11/07/11 138/94  09/09/11 133/81  09/01/11 130/70    Assessment: Hypertension control:  controlled  Progress toward goals:  at goal Barriers to meeting goals:  no barriers identified  Plan: Hypertension treatment:  continue current medications

## 2011-11-18 ENCOUNTER — Other Ambulatory Visit: Payer: Self-pay | Admitting: *Deleted

## 2011-11-18 DIAGNOSIS — I10 Essential (primary) hypertension: Secondary | ICD-10-CM

## 2011-11-18 MED ORDER — QUINAPRIL-HYDROCHLOROTHIAZIDE 20-25 MG PO TABS: 1.0000 | ORAL_TABLET | Freq: Every day | ORAL | Status: DC

## 2011-11-18 NOTE — Telephone Encounter (Signed)
Pharmacy is requesting 90 pills as this is how the medication comes from the drug company.  Angelina Ok, RN 11/18/2011 3:57 PM

## 2011-11-19 NOTE — Telephone Encounter (Signed)
Refill was called to the [facility].  Angelina Ok, RN 11/19/2011 9:13 AM

## 2011-11-21 ENCOUNTER — Encounter: Payer: Self-pay | Admitting: Internal Medicine

## 2011-11-21 ENCOUNTER — Ambulatory Visit (INDEPENDENT_AMBULATORY_CARE_PROVIDER_SITE_OTHER): Payer: Self-pay | Admitting: Internal Medicine

## 2011-11-21 DIAGNOSIS — R51 Headache: Secondary | ICD-10-CM

## 2011-11-21 DIAGNOSIS — G5603 Carpal tunnel syndrome, bilateral upper limbs: Secondary | ICD-10-CM | POA: Insufficient documentation

## 2011-11-21 DIAGNOSIS — Z23 Encounter for immunization: Secondary | ICD-10-CM

## 2011-11-21 DIAGNOSIS — Z299 Encounter for prophylactic measures, unspecified: Secondary | ICD-10-CM

## 2011-11-21 DIAGNOSIS — G5600 Carpal tunnel syndrome, unspecified upper limb: Secondary | ICD-10-CM

## 2011-11-21 NOTE — Progress Notes (Signed)
Subjective:   Patient ID: Emma Bonilla female   DOB: [birth year] 55 y.o.   MRN: [id number]  HPI: [name] is a 55 y.o. woman who presents to clinic today for follow up from her last appointment.  She was started on amitriptyline at her last visit because of constant migraine like headaches.  She states that the headaches have essentially gone away. In the last month she has had only 2 headaches that she attributes to stress and they went away with tylenol.  She was previously having headaches 5 of 7 days a week and occasionally 7 days a week.  She states she has had no side effects from the medication.    She has a history of carpal tunnel in both hands.  She has noticed that she has had more problems with numbness and tingling in her right hand as of late.  She states that it occasionally wakes her from sleep at night.  She has not had any problems with weakness in the hands.  She is right handed.   She states that she has had her pap smear around the first of the year at her OB/GYN.  She has never had an abnormal pap smear.    Past Medical History  Diagnosis Date  . Hyperlipidemia   . Hypertension   . Arthritis   . Thyroid disease   . Depression   . GERD (gastroesophageal reflux disease)   . TIA (transient ischemic attack)     2010  . Heart murmur    Current Outpatient Prescriptions  Medication Sig Dispense Refill  . amitriptyline (ELAVIL) 25 MG tablet Take 1 tablet (25 mg total) by mouth at bedtime.  30 tablet  1  . aspirin 81 MG tablet Take 1 tablet (81 mg total) by mouth daily.  30 tablet  3  . budesonide (RHINOCORT AQUA) 32 MCG/ACT nasal spray Place 2 sprays into the nose daily.  1 Bottle  2  . buPROPion (WELLBUTRIN SR) 150 MG 12 hr tablet Take 1 tablet (150 mg total) by mouth 2 (two) times daily.  60 tablet  3  . calcium-vitamin D (OSCAL WITH D) 500-200 MG-UNIT per tablet Take 1 tablet by mouth daily.  30 tablet  3  . desloratadine (CLARINEX) 5 MG tablet Take 1 tablet  (5 mg total) by mouth daily.  30 tablet  0  . diphenhydrAMINE (BENADRYL) 25 MG tablet Take 25 mg by mouth at bedtime as needed.        Marland Kitchen esomeprazole (NEXIUM) 40 MG capsule Take 1 capsule (40 mg total) by mouth daily before breakfast.  30 capsule  3  . etodolac (LODINE) 400 MG tablet Take 1 tablet (400 mg total) by mouth 2 (two) times daily.  60 tablet  2  . levothyroxine (SYNTHROID) 100 MCG tablet Take 1 tablet (100 mcg total) by mouth daily.  30 tablet  3  . quinapril-hydrochlorothiazide (ACCURETIC) 20-25 MG per tablet Take 1 tablet by mouth daily.  90 tablet  0  . rosuvastatin (CRESTOR) 5 MG tablet Take 1 tablet (5 mg total) by mouth at bedtime.  30 tablet  3  . TraMADol HCl 50 MG TBDP Take 50 mg by mouth 2 (two) times daily.  60 tablet  1   Family History  Problem Relation Age of Onset  . Heart disease Mother   . Hypertension Mother   . Heart disease Father   . Hypertension Father   . Heart disease Sister   . Hypertension  Sister   . Diabetes Sister   . Diabetes Brother    History   Social History  . Marital Status: Married    Spouse Name: N/A    Number of Children: N/A  . Years of Education: N/A   Social History Main Topics  . Smoking status: Current Everyday Smoker -- 0.5 packs/day for 45 years    Types: Cigarettes  . Smokeless tobacco: None  . Alcohol Use: None     occasional  . Drug Use: Yes    Special: Marijuana     occasional, last 3 months ago  . Sexually Active: Yes    Birth Control/ Protection: None   Other Topics Concern  . None   Social History Narrative  . None   Review of Systems: Constitutional: Denies fever, chills, diaphoresis, appetite change and fatigue.  HEENT: Denies photophobia, eye pain, redness, hearing loss, ear pain, congestion, sore throat, rhinorrhea, sneezing, mouth sores, trouble swallowing, neck pain, neck stiffness and tinnitus.   Respiratory: Denies SOB, DOE, cough, chest tightness,  and wheezing.   Cardiovascular: Denies chest  pain, palpitations and leg swelling.  Gastrointestinal: Denies nausea, vomiting, abdominal pain, diarrhea, constipation, blood in stool and abdominal distention.  Genitourinary: Denies dysuria, urgency, frequency, hematuria, flank pain and difficulty urinating.  Musculoskeletal: Denies myalgias, back pain, joint swelling, arthralgias and gait problem.  Skin: Denies pallor, rash and wound.  Neurological: Positive for hand numbness.  Denies dizziness, seizures, syncope, weakness, light-headedness,  and headaches.  Hematological: Denies adenopathy. Easy bruising, personal or family bleeding history  Psychiatric/Behavioral: Denies suicidal ideation, mood changes, confusion, nervousness, sleep disturbance and agitation  Objective:  Physical Exam: Filed Vitals:   11/21/11 1423  BP: 127/85  Pulse: 93  Temp: 97.5 F (36.4 C)  TempSrc: Oral  Height: 5\' 7"  (1.702 m)  Weight: 251 lb 11.2 oz (114.17 kg)   Constitutional: Vital signs reviewed.  Patient is a well-developed and well-nourished woman in no acute distress and cooperative with exam. Alert and oriented x3.  Head: Normocephalic and atraumatic Ear: TM normal bilaterally Mouth: no erythema or exudates, MMM Eyes: PERRL, EOMI, conjunctivae normal, No scleral icterus.  Neck: Supple, Trachea midline normal ROM, No JVD, mass, thyromegaly, or carotid bruit present.  Cardiovascular: RRR, S1 normal, S2 normal, no MRG, pulses symmetric and intact bilaterally Pulmonary/Chest: CTAB, no wheezes, rales, or rhonchi Abdominal: Soft. Non-tender, non-distended, bowel sounds are normal, no masses, organomegaly, or guarding present.  GU: no CVA tenderness Musculoskeletal: No joint deformities, erythema, or stiffness, ROM full and no nontender Hematology: no cervical, inginal, or axillary adenopathy.  Neurological: A&O x3, Strength is normal and symmetric bilaterally, cranial nerve II-XII are grossly intact, no focal motor deficit, sensory intact to light  touch bilaterally. She has a positive Tinel's sign bilaterally as well as a positive median nerve compression test bilaterally.  There is no muscle wasting and grip strength is normal bilaterally.  Skin: Warm, dry and intact. No rash, cyanosis, or clubbing.  Psychiatric: Normal mood and affect. speech and behavior is normal. Judgment and thought content normal. Cognition and memory are normal.   Assessment & Plan:

## 2011-11-21 NOTE — Patient Instructions (Signed)
1.  Continue your medications as prescribed.  2.  For the wrist  - Wear the brace especially at night.  - Use Ice, 20 minutes at a time several times daily to help the pain and swelling  - You can use Tylenol with the Tramadol.  Extra strength Tylenol 500 mg tablets 2 tablets 4 times daily for the pain.  - if it is not better in 2-3 weeks we could consider doing an injection for the pain.  3.  Follow up in 3-6 months.

## 2011-12-16 ENCOUNTER — Other Ambulatory Visit: Payer: Self-pay | Admitting: *Deleted

## 2011-12-16 DIAGNOSIS — K219 Gastro-esophageal reflux disease without esophagitis: Secondary | ICD-10-CM

## 2011-12-16 MED ORDER — ESOMEPRAZOLE MAGNESIUM 40 MG PO CPDR: 40.0000 mg | DELAYED_RELEASE_CAPSULE | Freq: Every day | ORAL | Status: DC

## 2011-12-16 NOTE — Telephone Encounter (Signed)
Refill approved - nurse to complete. 

## 2011-12-16 NOTE — Telephone Encounter (Signed)
Nexium  Refilled - rx request faxed to [facility].

## 2011-12-23 ENCOUNTER — Other Ambulatory Visit: Payer: Self-pay | Admitting: *Deleted

## 2011-12-23 DIAGNOSIS — E039 Hypothyroidism, unspecified: Secondary | ICD-10-CM

## 2011-12-23 MED ORDER — ROSUVASTATIN CALCIUM 5 MG PO TABS: 5.0000 mg | ORAL_TABLET | Freq: Every day | ORAL | Status: DC

## 2011-12-31 NOTE — Telephone Encounter (Signed)
Refill was called to the [facility].  Angelina Ok, RN 12/31/2011 10:47 AM.

## 2012-01-05 ENCOUNTER — Encounter: Payer: Self-pay | Admitting: Internal Medicine

## 2012-01-05 ENCOUNTER — Ambulatory Visit (INDEPENDENT_AMBULATORY_CARE_PROVIDER_SITE_OTHER): Payer: Self-pay | Admitting: Internal Medicine

## 2012-01-05 DIAGNOSIS — R51 Headache: Secondary | ICD-10-CM

## 2012-01-05 DIAGNOSIS — E039 Hypothyroidism, unspecified: Secondary | ICD-10-CM

## 2012-01-05 DIAGNOSIS — I10 Essential (primary) hypertension: Secondary | ICD-10-CM

## 2012-01-05 DIAGNOSIS — K219 Gastro-esophageal reflux disease without esophagitis: Secondary | ICD-10-CM

## 2012-01-05 DIAGNOSIS — E785 Hyperlipidemia, unspecified: Secondary | ICD-10-CM

## 2012-01-05 DIAGNOSIS — M1990 Unspecified osteoarthritis, unspecified site: Secondary | ICD-10-CM

## 2012-01-05 MED ORDER — TRAMADOL HCL 50 MG PO TBDP: 50.0000 mg | ORAL_TABLET | Freq: Two times a day (BID) | ORAL | Status: DC

## 2012-01-05 MED ORDER — AMITRIPTYLINE HCL 25 MG PO TABS: 25.0000 mg | ORAL_TABLET | Freq: Every day | ORAL | Status: DC

## 2012-01-05 MED ORDER — ROSUVASTATIN CALCIUM 5 MG PO TABS: 5.0000 mg | ORAL_TABLET | Freq: Every day | ORAL | Status: DC

## 2012-01-05 NOTE — Progress Notes (Signed)
  Subjective:    Patient ID: Emma Bonilla, female    DOB: [birth year], 56 y.o.   MRN: [id number]  HPI: 56 year old woman with a past medical history significant for hypertension, hyperlipidemia, osteoarthritis comes to the clinic for a followup visit.  Her knees are bothering her the most part other than that she had no complaints. She has tried tramadol that helps her. Denies any weakness numbness or swelling    Review of Systems  Constitutional: Negative for chills, diaphoresis and fatigue.  HENT: Negative for nosebleeds, congestion, rhinorrhea, neck stiffness and postnasal drip.   Respiratory: Negative for apnea, cough, choking, chest tightness and stridor.   Cardiovascular: Negative for chest pain, palpitations and leg swelling.  Genitourinary: Negative for dysuria, urgency and frequency.  Musculoskeletal: Positive for arthralgias.  Neurological: Negative for dizziness, light-headedness, numbness and headaches.  Hematological: Negative for adenopathy.  Psychiatric/Behavioral: Negative for agitation.       Objective:   Physical Exam  Constitutional: She appears well-developed and well-nourished.       Moderately obese.  HENT:  Head: Normocephalic and atraumatic.  Mouth/Throat: No oropharyngeal exudate.  Eyes: Conjunctivae and EOM are normal. Pupils are equal, round, and reactive to light.  Neck: Normal range of motion. Neck supple. No JVD present. No tracheal deviation present. No thyromegaly present.  Cardiovascular: Normal rate, regular rhythm and normal heart sounds.   Pulmonary/Chest: Effort normal and breath sounds normal. No stridor. No respiratory distress. She has no wheezes. She has no rales. She exhibits no tenderness.  Abdominal: Soft. Bowel sounds are normal. She exhibits no distension and no mass. There is no tenderness. There is no rebound and no guarding.  Musculoskeletal: Normal range of motion.       No crepitus noted on the exam.  Lymphadenopathy:    She has  no cervical adenopathy.  Neurological: She is alert. She displays normal reflexes. No cranial nerve deficit. She exhibits normal muscle tone. Coordination normal.          Assessment & Plan:

## 2012-01-05 NOTE — Patient Instructions (Signed)
Please schedule a follow up visit in 3 months. Please bring your medication bottles with your next appointment.

## 2012-01-06 NOTE — Assessment & Plan Note (Signed)
Lab Results  Component Value Date   NA 142 07/25/2011   K 4.2 07/25/2011   CL 103 07/25/2011   CO2 28 07/25/2011   BUN 18 07/25/2011   CREATININE 1.19* 07/25/2011    BP Readings from Last 3 Encounters:  01/05/12 131/86  11/21/11 127/85  11/07/11 138/94    Assessment: Hypertension control:  controlled  Progress toward goals:  at goal Barriers to meeting goals:  no barriers identified  Plan: Hypertension treatment:  continue current medications

## 2012-01-06 NOTE — Assessment & Plan Note (Signed)
Stable.  Continue nexium 

## 2012-01-06 NOTE — Assessment & Plan Note (Signed)
Stable with amitryptiline.

## 2012-01-06 NOTE — Assessment & Plan Note (Signed)
States that she has history of arthritis and her knees are bothering her the most. - Symptomatic treatment with tramadol. - counseled her on weight reduction -

## 2012-01-15 ENCOUNTER — Ambulatory Visit: Payer: Self-pay | Attending: Internal Medicine | Admitting: Physical Therapy

## 2012-01-15 DIAGNOSIS — IMO0001 Reserved for inherently not codable concepts without codable children: Secondary | ICD-10-CM | POA: Insufficient documentation

## 2012-01-15 DIAGNOSIS — M25569 Pain in unspecified knee: Secondary | ICD-10-CM | POA: Insufficient documentation

## 2012-01-15 DIAGNOSIS — M25669 Stiffness of unspecified knee, not elsewhere classified: Secondary | ICD-10-CM | POA: Insufficient documentation

## 2012-01-17 NOTE — Assessment & Plan Note (Signed)
She has bilateral carpal tunnel and is hesitant to consider EMG or surgery at this time.  She states that it is not affecting her life enough to do that yet.  She will try anti-inflammatory medication with tylenol and tramadol as well as bilateral splints while she is sleeping to see.  She will likely need a referral for EMG or hand surgery if this continues to progress.

## 2012-01-17 NOTE — Assessment & Plan Note (Signed)
She has had an excellent response to the amitriptyline and states she has been sleeping well.  We will continue the medication for now and continue to monitor.

## 2012-01-22 ENCOUNTER — Ambulatory Visit: Payer: Self-pay | Admitting: Physical Therapy

## 2012-01-26 ENCOUNTER — Ambulatory Visit: Payer: Self-pay | Admitting: Physical Therapy

## 2012-01-29 ENCOUNTER — Ambulatory Visit: Payer: Self-pay | Admitting: Physical Therapy

## 2012-02-02 ENCOUNTER — Ambulatory Visit: Payer: Self-pay | Attending: Internal Medicine | Admitting: Physical Therapy

## 2012-02-02 DIAGNOSIS — M25669 Stiffness of unspecified knee, not elsewhere classified: Secondary | ICD-10-CM | POA: Insufficient documentation

## 2012-02-02 DIAGNOSIS — IMO0001 Reserved for inherently not codable concepts without codable children: Secondary | ICD-10-CM | POA: Insufficient documentation

## 2012-02-02 DIAGNOSIS — M25569 Pain in unspecified knee: Secondary | ICD-10-CM | POA: Insufficient documentation

## 2012-02-03 ENCOUNTER — Other Ambulatory Visit: Payer: Self-pay | Admitting: *Deleted

## 2012-02-03 DIAGNOSIS — K219 Gastro-esophageal reflux disease without esophagitis: Secondary | ICD-10-CM

## 2012-02-04 ENCOUNTER — Encounter: Payer: Self-pay | Admitting: Physical Therapy

## 2012-02-04 MED ORDER — ESOMEPRAZOLE MAGNESIUM 40 MG PO CPDR: 40.0000 mg | DELAYED_RELEASE_CAPSULE | Freq: Every day | ORAL | Status: DC

## 2012-02-05 NOTE — Telephone Encounter (Signed)
Phone to pharmacy  

## 2012-02-09 ENCOUNTER — Ambulatory Visit: Payer: Self-pay | Admitting: Physical Therapy

## 2012-02-09 ENCOUNTER — Other Ambulatory Visit: Payer: Self-pay | Admitting: *Deleted

## 2012-02-10 MED ORDER — BUDESONIDE 32 MCG/ACT NA SUSP: 2.0000 | Freq: Every day | NASAL | Status: DC

## 2012-02-11 NOTE — Telephone Encounter (Signed)
Rhinocort refilled - rx request form faxed to [facility].

## 2012-02-12 ENCOUNTER — Ambulatory Visit: Payer: Self-pay | Admitting: Physical Therapy

## 2012-02-17 ENCOUNTER — Other Ambulatory Visit: Payer: Self-pay | Admitting: *Deleted

## 2012-02-17 DIAGNOSIS — I10 Essential (primary) hypertension: Secondary | ICD-10-CM

## 2012-02-17 MED ORDER — QUINAPRIL-HYDROCHLOROTHIAZIDE 20-25 MG PO TABS: 1.0000 | ORAL_TABLET | Freq: Every day | ORAL | Status: DC

## 2012-02-17 NOTE — Telephone Encounter (Signed)
Rx phoned to pharmacy.  

## 2012-02-20 ENCOUNTER — Encounter: Payer: Self-pay | Admitting: Physical Therapy

## 2012-02-26 ENCOUNTER — Encounter: Payer: Self-pay | Admitting: Physical Therapy

## 2012-03-08 ENCOUNTER — Other Ambulatory Visit: Payer: Self-pay | Admitting: Family Medicine

## 2012-03-09 ENCOUNTER — Ambulatory Visit: Payer: Self-pay | Attending: Internal Medicine | Admitting: Physical Therapy

## 2012-03-09 ENCOUNTER — Other Ambulatory Visit: Payer: Self-pay | Admitting: *Deleted

## 2012-03-09 DIAGNOSIS — M25669 Stiffness of unspecified knee, not elsewhere classified: Secondary | ICD-10-CM | POA: Insufficient documentation

## 2012-03-09 DIAGNOSIS — IMO0001 Reserved for inherently not codable concepts without codable children: Secondary | ICD-10-CM | POA: Insufficient documentation

## 2012-03-09 DIAGNOSIS — J302 Other seasonal allergic rhinitis: Secondary | ICD-10-CM

## 2012-03-09 DIAGNOSIS — M25569 Pain in unspecified knee: Secondary | ICD-10-CM | POA: Insufficient documentation

## 2012-03-09 MED ORDER — DESLORATADINE 5 MG PO TABS: 5.0000 mg | ORAL_TABLET | Freq: Every day | ORAL | Status: DC

## 2012-03-10 NOTE — Telephone Encounter (Signed)
Clarinex refilled - rx request form faxed to [facility].

## 2012-03-24 ENCOUNTER — Other Ambulatory Visit: Payer: Self-pay | Admitting: *Deleted

## 2012-03-24 DIAGNOSIS — E039 Hypothyroidism, unspecified: Secondary | ICD-10-CM

## 2012-03-25 MED ORDER — LEVOTHYROXINE SODIUM 100 MCG PO TABS: 100.0000 ug | ORAL_TABLET | Freq: Every day | ORAL | Status: DC

## 2012-03-26 NOTE — Telephone Encounter (Signed)
Called to pharmacy 

## 2012-03-30 ENCOUNTER — Ambulatory Visit (INDEPENDENT_AMBULATORY_CARE_PROVIDER_SITE_OTHER): Payer: Self-pay | Admitting: Internal Medicine

## 2012-03-30 ENCOUNTER — Encounter: Payer: Self-pay | Admitting: Internal Medicine

## 2012-03-30 VITALS — BP 124/88 | HR 87 | Temp 97.4°F | Resp 20 | Ht 67.5 in | Wt 241.5 lb

## 2012-03-30 DIAGNOSIS — I10 Essential (primary) hypertension: Secondary | ICD-10-CM

## 2012-03-30 DIAGNOSIS — Z0000 Encounter for general adult medical examination without abnormal findings: Secondary | ICD-10-CM

## 2012-03-30 DIAGNOSIS — J302 Other seasonal allergic rhinitis: Secondary | ICD-10-CM

## 2012-03-30 DIAGNOSIS — E039 Hypothyroidism, unspecified: Secondary | ICD-10-CM

## 2012-03-30 DIAGNOSIS — R51 Headache: Secondary | ICD-10-CM

## 2012-03-30 DIAGNOSIS — E785 Hyperlipidemia, unspecified: Secondary | ICD-10-CM

## 2012-03-30 DIAGNOSIS — J309 Allergic rhinitis, unspecified: Secondary | ICD-10-CM

## 2012-03-30 DIAGNOSIS — M1990 Unspecified osteoarthritis, unspecified site: Secondary | ICD-10-CM

## 2012-03-30 LAB — CBC WITH DIFFERENTIAL/PLATELET
Basophils Absolute: 0 10*3/uL (ref 0.0–0.1)
Eosinophils Absolute: 0.3 10*3/uL (ref 0.0–0.7)
Eosinophils Relative: 4 % (ref 0–5)
MCH: 27.7 pg (ref 26.0–34.0)
MCV: 87.4 fL (ref 78.0–100.0)
Platelets: 377 10*3/uL (ref 150–400)
RDW: 15.4 % (ref 11.5–15.5)

## 2012-03-30 MED ORDER — TRAMADOL HCL 50 MG PO TBDP: 50.0000 mg | ORAL_TABLET | Freq: Two times a day (BID) | ORAL | Status: DC

## 2012-03-30 MED ORDER — LEVOTHYROXINE SODIUM 100 MCG PO TABS: 100.0000 ug | ORAL_TABLET | Freq: Every day | ORAL | Status: DC

## 2012-03-30 MED ORDER — OLOPATADINE HCL 0.1 % OP SOLN: 1.0000 [drp] | Freq: Two times a day (BID) | OPHTHALMIC | Status: DC

## 2012-03-30 MED ORDER — QUINAPRIL-HYDROCHLOROTHIAZIDE 20-25 MG PO TABS: 1.0000 | ORAL_TABLET | Freq: Every day | ORAL | Status: DC

## 2012-03-30 NOTE — Patient Instructions (Signed)
Please schedule a follow up appointment in 3-4 months . Please bring your medication bottles with your next appointment. Please take your medicines as prescribed. I will call you with your lab results if anything will be abnormal.  

## 2012-03-30 NOTE — Progress Notes (Signed)
  Subjective:    Patient ID: Emma Bonilla, female    DOB: 06/06/1956, 56 y.o.   MRN: [id number]  HPI: 56 year old woman with past medical history significant for hypertension, migraines comes to the clinic for followup visit.  She states that she has been hurting in her right ankle for last week and has not been able to sleep well because of the pain. She denies any recent trauma but states that she hurt her ankle twice during her childhood in some accidents and since then it has always been  a problem for her. Denies noticing any redness or swelling or fevers associated with it.  She has been having lot of problems with the allergies in her eyes- they  have been watery,  itchy and red and she was requesting to get Patanol drops that she has used in the past.    Review of Systems  Constitutional: Negative for fever, diaphoresis, appetite change and fatigue.  HENT: Negative for congestion, rhinorrhea, sneezing, neck pain and postnasal drip.   Eyes: Negative for visual disturbance.  Respiratory: Negative for apnea, choking, chest tightness and shortness of breath.   Cardiovascular: Negative for chest pain, palpitations and leg swelling.  Genitourinary: Negative for dysuria, urgency, flank pain and dyspareunia.  Neurological: Negative for dizziness, speech difficulty and light-headedness.  Hematological: Negative for adenopathy.       Objective:   Physical Exam  Constitutional: She is oriented to person, place, and time. She appears well-developed and well-nourished. No distress.  HENT:  Head: Normocephalic and atraumatic.  Mouth/Throat: No oropharyngeal exudate.       Conjunctival redness and watering noticed from both of her eyes.  Eyes: Conjunctivae and EOM are normal. Pupils are equal, round, and reactive to light.  Neck: Normal range of motion. Neck supple. No JVD present. No tracheal deviation present. No thyromegaly present.  Cardiovascular: Normal rate, regular rhythm, normal  heart sounds and intact distal pulses.  Exam reveals no gallop and no friction rub.   No murmur heard. Pulmonary/Chest: Effort normal and breath sounds normal. No stridor. No respiratory distress. She has no wheezes. She has no rales. She exhibits no tenderness.  Abdominal: Soft. Bowel sounds are normal. She exhibits no distension. There is no tenderness. There is no rebound.  Musculoskeletal: Normal range of motion. She exhibits no edema and no tenderness.       No redness, swelling or effusion noticed. Mild pain on the dorsal aspect with internal rotation of the ankle.  Neurological: She is alert and oriented to person, place, and time. She has normal reflexes. She displays normal reflexes. No cranial nerve deficit. She exhibits normal muscle tone. Coordination normal.  Skin: Skin is warm. She is not diaphoretic.          Assessment & Plan:

## 2012-03-30 NOTE — Assessment & Plan Note (Signed)
Lab Results  Component Value Date   NA 142 07/25/2011   K 4.2 07/25/2011   CL 103 07/25/2011   CO2 28 07/25/2011   BUN 18 07/25/2011   CREATININE 1.19* 07/25/2011    BP Readings from Last 3 Encounters:  03/30/12 124/88  01/05/12 131/86  11/21/11 127/85    Assessment: Hypertension control:  controlled  Progress toward goals:  at goal Barriers to meeting goals:  no barriers identified  Plan: Hypertension treatment:  continue current medications. Check BMET today.

## 2012-03-30 NOTE — Assessment & Plan Note (Signed)
Check lipids and CMET. 

## 2012-03-30 NOTE — Assessment & Plan Note (Signed)
We did Pap smear today. Routine health maintenance labs CBC, CMET. Patient is uptodate with mamaogram.

## 2012-03-30 NOTE — Assessment & Plan Note (Signed)
Check TSH 

## 2012-03-31 LAB — COMPLETE METABOLIC PANEL WITH GFR
ALT: 12 U/L (ref 0–35)
AST: 15 U/L (ref 0–37)
Albumin: 4.2 g/dL (ref 3.5–5.2)
BUN: 10 mg/dL (ref 6–23)
Calcium: 9.3 mg/dL (ref 8.4–10.5)
Chloride: 106 mEq/L (ref 96–112)
Potassium: 4.1 mEq/L (ref 3.5–5.3)
Total Protein: 6.7 g/dL (ref 6.0–8.3)

## 2012-03-31 LAB — LIPID PANEL
HDL: 34 mg/dL — ABNORMAL LOW (ref >39)
LDL Cholesterol: 90 mg/dL (ref 0–99)

## 2012-04-03 DIAGNOSIS — J3089 Other allergic rhinitis: Secondary | ICD-10-CM | POA: Insufficient documentation

## 2012-04-03 DIAGNOSIS — J302 Other seasonal allergic rhinitis: Secondary | ICD-10-CM | POA: Insufficient documentation

## 2012-04-03 NOTE — Assessment & Plan Note (Signed)
She was given a prescription for Patanol eye drops.

## 2012-05-20 ENCOUNTER — Other Ambulatory Visit: Payer: Self-pay | Admitting: *Deleted

## 2012-05-20 DIAGNOSIS — J302 Other seasonal allergic rhinitis: Secondary | ICD-10-CM

## 2012-05-20 NOTE — Telephone Encounter (Signed)
[facility] has 30 ml ( 6 bottles total ) in pharmacy.  Please write for 30 ml. 1 bottle last about 25 days.

## 2012-05-21 ENCOUNTER — Other Ambulatory Visit: Payer: Self-pay | Admitting: Internal Medicine

## 2012-05-21 DIAGNOSIS — J302 Other seasonal allergic rhinitis: Secondary | ICD-10-CM

## 2012-05-21 MED ORDER — OLOPATADINE HCL 0.1 % OP SOLN: 1.0000 [drp] | Freq: Two times a day (BID) | OPHTHALMIC | Status: DC

## 2012-05-21 MED ORDER — OLOPATADINE HCL 0.1 % OP SOLN: 1.0000 [drp] | Freq: Two times a day (BID) | OPHTHALMIC | Status: AC

## 2012-05-26 NOTE — Telephone Encounter (Signed)
Rx faxed in.

## 2012-06-17 ENCOUNTER — Other Ambulatory Visit: Payer: Self-pay | Admitting: *Deleted

## 2012-06-17 DIAGNOSIS — M1990 Unspecified osteoarthritis, unspecified site: Secondary | ICD-10-CM

## 2012-06-17 DIAGNOSIS — R51 Headache: Secondary | ICD-10-CM

## 2012-06-22 MED ORDER — TRAMADOL HCL 50 MG PO TBDP: 50.0000 mg | ORAL_TABLET | Freq: Two times a day (BID) | ORAL | Status: DC

## 2012-06-23 NOTE — Telephone Encounter (Signed)
Rx called in to pharmacy. Stanton Kidney Lucelia Lacey RN 06/23/12 12:15PM

## 2012-06-28 ENCOUNTER — Ambulatory Visit (INDEPENDENT_AMBULATORY_CARE_PROVIDER_SITE_OTHER): Payer: Self-pay | Admitting: Internal Medicine

## 2012-06-28 ENCOUNTER — Encounter: Payer: Self-pay | Admitting: Internal Medicine

## 2012-06-28 VITALS — BP 120/81 | HR 83 | Temp 97.0°F | Ht 69.0 in | Wt 228.7 lb

## 2012-06-28 DIAGNOSIS — E039 Hypothyroidism, unspecified: Secondary | ICD-10-CM

## 2012-06-28 DIAGNOSIS — F17200 Nicotine dependence, unspecified, uncomplicated: Secondary | ICD-10-CM

## 2012-06-28 DIAGNOSIS — I10 Essential (primary) hypertension: Secondary | ICD-10-CM

## 2012-06-28 DIAGNOSIS — K219 Gastro-esophageal reflux disease without esophagitis: Secondary | ICD-10-CM

## 2012-06-28 DIAGNOSIS — E785 Hyperlipidemia, unspecified: Secondary | ICD-10-CM

## 2012-06-28 DIAGNOSIS — J302 Other seasonal allergic rhinitis: Secondary | ICD-10-CM

## 2012-06-28 DIAGNOSIS — Z720 Tobacco use: Secondary | ICD-10-CM

## 2012-06-28 DIAGNOSIS — R1011 Right upper quadrant pain: Secondary | ICD-10-CM

## 2012-06-28 DIAGNOSIS — G43909 Migraine, unspecified, not intractable, without status migrainosus: Secondary | ICD-10-CM

## 2012-06-28 MED ORDER — QUINAPRIL-HYDROCHLOROTHIAZIDE 20-25 MG PO TABS: 1.0000 | ORAL_TABLET | Freq: Every day | ORAL | Status: DC

## 2012-06-28 MED ORDER — DESLORATADINE 5 MG PO TABS: 5.0000 mg | ORAL_TABLET | Freq: Every day | ORAL | Status: DC

## 2012-06-28 MED ORDER — ESOMEPRAZOLE MAGNESIUM 40 MG PO CPDR: 40.0000 mg | DELAYED_RELEASE_CAPSULE | Freq: Every day | ORAL | Status: DC

## 2012-06-28 MED ORDER — AMITRIPTYLINE HCL 25 MG PO TABS: 25.0000 mg | ORAL_TABLET | Freq: Every day | ORAL | Status: DC

## 2012-06-28 MED ORDER — ROSUVASTATIN CALCIUM 5 MG PO TABS: 5.0000 mg | ORAL_TABLET | Freq: Every day | ORAL | Status: DC

## 2012-06-28 MED ORDER — LEVOTHYROXINE SODIUM 100 MCG PO TABS: 100.0000 ug | ORAL_TABLET | Freq: Every day | ORAL | Status: DC

## 2012-06-28 NOTE — Progress Notes (Signed)
  Subjective:    Patient ID: Emma Bonilla, female    DOB: [birth year], 56 y.o.   MRN: [id number]  HPI: 56 year old woman with past medical history significant for , hypothyroidism, seasonal allergies comes to the clinic for a followup visit.  She reports that she has been experiencing increased allergic symptoms since they turned on their air- conditioner about 2 weeks ago. She has been noticing increased itching and watering from her eyes with some clear nasal discharge and drainage. She has been using her Clarinex, Rhinocort and Patanol eyedrops that has been helping her to some extent.  She is also concerned about a new cystic swelling that she noticed along the distribution of the tendon in her left hand around July 4th. She denies any pain or redness associated with it and states that it doesn't bother her at all.  She describes occasional tightness in her right upper quadrant that started about a month ago occurring at a frequency of about once or twice in a week.  She denies any discomfort today. Denies any association of worsening pain with food, nausea or vomiting associated with it.  Has been trying to exercise and has lost some weight since her last visit.       Review of Systems  Constitutional: Negative for fever, chills and fatigue.  HENT: Negative for congestion, rhinorrhea and postnasal drip.   Eyes: Positive for redness and itching. Negative for visual disturbance.  Respiratory: Negative for cough, choking, chest tightness and shortness of breath.   Cardiovascular: Negative for chest pain, palpitations and leg swelling.  Musculoskeletal: Negative for back pain and arthralgias.  Neurological: Negative for facial asymmetry, light-headedness and headaches.  Hematological: Negative for adenopathy.  Psychiatric/Behavioral: Negative for agitation.       Objective:   Physical Exam  Constitutional: She is oriented to person, place, and time. She appears well-developed and  well-nourished. No distress.  HENT:  Head: Normocephalic and atraumatic.  Eyes: Conjunctivae and EOM are normal. Pupils are equal, round, and reactive to light.       conjunctival redness and watering from eyes.  Neck: Normal range of motion. Neck supple. No JVD present. No tracheal deviation present. No thyromegaly present.  Cardiovascular: Normal rate, regular rhythm, normal heart sounds and intact distal pulses.  Exam reveals no gallop and no friction rub.   No murmur heard. Pulmonary/Chest: Effort normal and breath sounds normal. No stridor. No respiratory distress. She has no wheezes.  Abdominal: Soft. Bowel sounds are normal. She exhibits no distension. There is no tenderness. There is no rebound and no guarding.  Musculoskeletal: Normal range of motion. She exhibits no edema.  Lymphadenopathy:    She has no cervical adenopathy.  Neurological: She is alert and oriented to person, place, and time. She has normal reflexes. No cranial nerve deficit. Coordination normal.  Skin: Skin is warm. She is not diaphoretic.       Left hand ganglion cyst          Assessment & Plan:

## 2012-06-28 NOTE — Assessment & Plan Note (Signed)
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 Her LFT's were reviewed from April of 2013 which were completely within normal limits. -Continue to monitor for now

## 2012-06-28 NOTE — Assessment & Plan Note (Signed)
-   Continue symptomatic treatment with Rhinocort, Clarinex and Patanol. She was given refills on the medications. - Also advised to do steam inhalation

## 2012-06-28 NOTE — Assessment & Plan Note (Signed)
She has cut down on her smoking. Congratulated her and encouraged her to quit it altogether

## 2012-06-28 NOTE — Patient Instructions (Addendum)
Please schedule a follow up appointment in 3 months. Please bring your medication bottles with your next appointment. Please take your medicines as prescribed.  

## 2012-06-28 NOTE — Assessment & Plan Note (Signed)
Lab Results  Component Value Date   NA 142 03/30/2012   K 4.1 03/30/2012   CL 106 03/30/2012   CO2 27 03/30/2012   BUN 10 03/30/2012   CREATININE 0.83 03/30/2012    BP Readings from Last 3 Encounters:  06/28/12 120/81  03/30/12 124/88  01/05/12 131/86    Assessment: Hypertension control:  controlled  Progress toward goals:  at goal Barriers to meeting goals:  no barriers identified  Plan: Hypertension treatment:  continue current medications

## 2012-08-11 ENCOUNTER — Other Ambulatory Visit: Payer: Self-pay | Admitting: *Deleted

## 2012-08-11 DIAGNOSIS — K219 Gastro-esophageal reflux disease without esophagitis: Secondary | ICD-10-CM

## 2012-08-11 MED ORDER — ESOMEPRAZOLE MAGNESIUM 40 MG PO CPDR: 40.0000 mg | DELAYED_RELEASE_CAPSULE | Freq: Every day | ORAL | Status: DC

## 2012-08-11 NOTE — Telephone Encounter (Signed)
Rx called in to pharmacy. 

## 2012-08-19 ENCOUNTER — Other Ambulatory Visit: Payer: Self-pay | Admitting: *Deleted

## 2012-08-19 DIAGNOSIS — E785 Hyperlipidemia, unspecified: Secondary | ICD-10-CM

## 2012-08-19 DIAGNOSIS — E039 Hypothyroidism, unspecified: Secondary | ICD-10-CM

## 2012-08-19 MED ORDER — ROSUVASTATIN CALCIUM 5 MG PO TABS: 5.0000 mg | ORAL_TABLET | Freq: Every day | ORAL | Status: DC

## 2012-08-19 NOTE — Telephone Encounter (Signed)
Needs a three month supply per pharmacy.

## 2012-08-19 NOTE — Telephone Encounter (Signed)
Rx called in to pharmacy. 

## 2012-08-23 ENCOUNTER — Encounter: Payer: Self-pay | Admitting: Internal Medicine

## 2012-09-02 ENCOUNTER — Other Ambulatory Visit: Payer: Self-pay | Admitting: *Deleted

## 2012-09-02 DIAGNOSIS — R51 Headache: Secondary | ICD-10-CM

## 2012-09-02 DIAGNOSIS — M1990 Unspecified osteoarthritis, unspecified site: Secondary | ICD-10-CM

## 2012-09-02 MED ORDER — TRAMADOL HCL 50 MG PO TBDP: 50.0000 mg | ORAL_TABLET | Freq: Two times a day (BID) | ORAL | Status: DC

## 2012-09-02 NOTE — Telephone Encounter (Signed)
Rx faxed in.

## 2012-09-28 ENCOUNTER — Other Ambulatory Visit: Payer: Self-pay | Admitting: *Deleted

## 2012-09-28 DIAGNOSIS — I10 Essential (primary) hypertension: Secondary | ICD-10-CM

## 2012-09-29 MED ORDER — QUINAPRIL-HYDROCHLOROTHIAZIDE 20-25 MG PO TABS: 1.0000 | ORAL_TABLET | Freq: Every day | ORAL | Status: DC

## 2012-09-30 NOTE — Telephone Encounter (Signed)
Rx faxed in.

## 2012-11-09 ENCOUNTER — Other Ambulatory Visit: Payer: Self-pay | Admitting: *Deleted

## 2012-11-09 DIAGNOSIS — K219 Gastro-esophageal reflux disease without esophagitis: Secondary | ICD-10-CM

## 2012-11-10 MED ORDER — ESOMEPRAZOLE MAGNESIUM 40 MG PO CPDR: 40.0000 mg | DELAYED_RELEASE_CAPSULE | Freq: Every day | ORAL | Status: DC

## 2012-11-10 NOTE — Telephone Encounter (Signed)
Rx called in to pharmacy. 

## 2012-12-02 ENCOUNTER — Other Ambulatory Visit: Payer: Self-pay | Admitting: *Deleted

## 2012-12-02 DIAGNOSIS — G43909 Migraine, unspecified, not intractable, without status migrainosus: Secondary | ICD-10-CM

## 2012-12-03 MED ORDER — AMITRIPTYLINE HCL 25 MG PO TABS: 25.0000 mg | ORAL_TABLET | Freq: Every day | ORAL | Status: DC

## 2012-12-14 ENCOUNTER — Ambulatory Visit: Payer: Self-pay

## 2013-01-05 ENCOUNTER — Other Ambulatory Visit: Payer: Self-pay | Admitting: *Deleted

## 2013-01-05 ENCOUNTER — Ambulatory Visit: Payer: No Typology Code available for payment source

## 2013-01-05 DIAGNOSIS — I10 Essential (primary) hypertension: Secondary | ICD-10-CM

## 2013-01-05 MED ORDER — QUINAPRIL-HYDROCHLOROTHIAZIDE 20-25 MG PO TABS: 1.0000 | ORAL_TABLET | Freq: Every day | ORAL | Status: DC

## 2013-01-05 NOTE — Telephone Encounter (Signed)
[facility] informed of Accuretic rx refill.

## 2013-01-07 ENCOUNTER — Encounter: Payer: Self-pay | Admitting: Internal Medicine

## 2013-01-07 ENCOUNTER — Ambulatory Visit (INDEPENDENT_AMBULATORY_CARE_PROVIDER_SITE_OTHER): Payer: No Typology Code available for payment source | Admitting: Internal Medicine

## 2013-01-07 VITALS — BP 122/79 | HR 79 | Temp 97.3°F | Ht 69.0 in | Wt 229.8 lb

## 2013-01-07 DIAGNOSIS — W57XXXA Bitten or stung by nonvenomous insect and other nonvenomous arthropods, initial encounter: Secondary | ICD-10-CM

## 2013-01-07 DIAGNOSIS — F17200 Nicotine dependence, unspecified, uncomplicated: Secondary | ICD-10-CM

## 2013-01-07 DIAGNOSIS — G43909 Migraine, unspecified, not intractable, without status migrainosus: Secondary | ICD-10-CM

## 2013-01-07 DIAGNOSIS — E039 Hypothyroidism, unspecified: Secondary | ICD-10-CM

## 2013-01-07 DIAGNOSIS — I10 Essential (primary) hypertension: Secondary | ICD-10-CM

## 2013-01-07 DIAGNOSIS — K219 Gastro-esophageal reflux disease without esophagitis: Secondary | ICD-10-CM

## 2013-01-07 DIAGNOSIS — E785 Hyperlipidemia, unspecified: Secondary | ICD-10-CM

## 2013-01-07 DIAGNOSIS — Z23 Encounter for immunization: Secondary | ICD-10-CM

## 2013-01-07 DIAGNOSIS — Z0000 Encounter for general adult medical examination without abnormal findings: Secondary | ICD-10-CM

## 2013-01-07 DIAGNOSIS — Z720 Tobacco use: Secondary | ICD-10-CM

## 2013-01-07 LAB — CBC
MCH: 27.9 pg (ref 26.0–34.0)
MCV: 84 fL (ref 78.0–100.0)
Platelets: 360 10*3/uL (ref 150–400)
RDW: 15 % (ref 11.5–15.5)

## 2013-01-07 LAB — COMPLETE METABOLIC PANEL WITHOUT GFR
ALT: 10 U/L (ref 0–35)
AST: 13 U/L (ref 0–37)
Albumin: 4.1 g/dL (ref 3.5–5.2)
Alkaline Phosphatase: 78 U/L (ref 39–117)
BUN: 12 mg/dL (ref 6–23)
CO2: 27 meq/L (ref 19–32)
Calcium: 9.7 mg/dL (ref 8.4–10.5)
Chloride: 107 meq/L (ref 96–112)
Creat: 0.9 mg/dL (ref 0.50–1.10)
GFR, Est African American: 83 mL/min
GFR, Est Non African American: 72 mL/min
Glucose, Bld: 93 mg/dL (ref 70–99)
Potassium: 4 meq/L (ref 3.5–5.3)
Sodium: 141 meq/L (ref 135–145)
Total Bilirubin: 0.3 mg/dL (ref 0.3–1.2)
Total Protein: 6.8 g/dL (ref 6.0–8.3)

## 2013-01-07 LAB — LIPID PANEL
LDL Cholesterol: 101 mg/dL — ABNORMAL HIGH (ref 0–99)
Total CHOL/HDL Ratio: 4.2 Ratio
Triglycerides: 75 mg/dL (ref <150)
VLDL: 15 mg/dL (ref 0–40)

## 2013-01-07 MED ORDER — DIPHENHYDRAMINE HCL 25 MG PO TABS: 25.0000 mg | ORAL_TABLET | Freq: Every evening | ORAL | Status: DC | PRN

## 2013-01-07 MED ORDER — QUINAPRIL-HYDROCHLOROTHIAZIDE 20-25 MG PO TABS: 1.0000 | ORAL_TABLET | Freq: Every day | ORAL | Status: DC

## 2013-01-07 MED ORDER — ESOMEPRAZOLE MAGNESIUM 40 MG PO CPDR: 40.0000 mg | DELAYED_RELEASE_CAPSULE | Freq: Every day | ORAL | Status: DC

## 2013-01-07 MED ORDER — AMITRIPTYLINE HCL 25 MG PO TABS: 25.0000 mg | ORAL_TABLET | Freq: Every day | ORAL | Status: DC

## 2013-01-07 MED ORDER — ROSUVASTATIN CALCIUM 5 MG PO TABS: 5.0000 mg | ORAL_TABLET | Freq: Every day | ORAL | Status: DC

## 2013-01-07 NOTE — Assessment & Plan Note (Signed)
BP Readings from Last 3 Encounters:  01/07/13 122/79  06/28/12 120/81  03/30/12 124/88    Lab Results  Component Value Date   NA 142 03/30/2012   K 4.1 03/30/2012   CREATININE 0.83 03/30/2012    Assessment:  Blood pressure control: controlled  Progress toward BP goal:  at goal    Plan:  Medications:  continue current medications  Educational resources provided:    Self management tools provided:    Other plans: Check CMET.

## 2013-01-07 NOTE — Assessment & Plan Note (Signed)
Mammogram referral was placed with the visit. She got a flu- shot today.

## 2013-01-07 NOTE — Patient Instructions (Addendum)
General Instructions: Please schedule a follow up appointment in 3 months or sooner if needed. Please bring your medication bottles with your next appointment. Please take your medicines as prescribed. I will call you with your lab results if anything will be abnormal.    Treatment Goals:  Goals (1 Years of Data) as of 01/07/2013          As of Today 06/28/12 03/30/12 01/05/12     Blood Pressure    . Blood Pressure < 140/90  122/79 120/81 124/88 131/86      Progress Toward Treatment Goals:  Treatment Goal 01/07/2013  Blood pressure at goal  Stop smoking smoking more    Self Care Goals & Plans:  Self Care Goal 01/07/2013  Manage my medications take my medicines as prescribed; bring my medications to every visit; refill my medications on time; follow the sick day instructions if I am sick  Eat healthy foods eat foods that are low in salt  Be physically active take a walk every day; take the stairs instead of the elevator  Stop smoking see a smoking cessation counselor       Care Management & Community Referrals:  Referral 01/07/2013  Referrals made for care management support none needed; smoking cessation counselor

## 2013-01-07 NOTE — Assessment & Plan Note (Signed)
She was counseled on tobacco cessation. She was given the number 1-800 - QUIT- NOW and was also suggested to set up a quit date and try to quit smoking on that day altogether.

## 2013-01-07 NOTE — Assessment & Plan Note (Signed)
Recheck TSH. Would adjust the dose based on the labs.

## 2013-01-07 NOTE — Assessment & Plan Note (Signed)
Patient reports getting insect bite last week.  On exam, she was noticed to have insect bite mark on the middle of her right thigh that appears to be healing pretty well, with some in duration but no erythema, swelling or tenderness. No pus or drainage was noticed. -She was given a prescription of Benadryl for itching.

## 2013-01-07 NOTE — Assessment & Plan Note (Signed)
Check lipid panel and CMET. 
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
## 2013-01-12 ENCOUNTER — Other Ambulatory Visit: Payer: Self-pay | Admitting: *Deleted

## 2013-01-12 DIAGNOSIS — J302 Other seasonal allergic rhinitis: Secondary | ICD-10-CM

## 2013-01-12 MED ORDER — DESLORATADINE 5 MG PO TABS: 5.0000 mg | ORAL_TABLET | Freq: Every day | ORAL | Status: DC

## 2013-01-12 NOTE — Telephone Encounter (Signed)
Rx called in to pharmacy. 

## 2013-02-16 ENCOUNTER — Ambulatory Visit (HOSPITAL_COMMUNITY)
Admission: RE | Admit: 2013-02-16 | Discharge: 2013-02-16 | Disposition: A | Discharge status: Home / Self Care | Payer: No Typology Code available for payment source | Source: Ambulatory Visit | Attending: Internal Medicine | Admitting: Internal Medicine

## 2013-02-16 DIAGNOSIS — Z1231 Encounter for screening mammogram for malignant neoplasm of breast: Secondary | ICD-10-CM | POA: Insufficient documentation

## 2013-02-16 DIAGNOSIS — Z0000 Encounter for general adult medical examination without abnormal findings: Secondary | ICD-10-CM

## 2013-02-22 ENCOUNTER — Other Ambulatory Visit: Payer: Self-pay | Admitting: *Deleted

## 2013-02-22 DIAGNOSIS — E039 Hypothyroidism, unspecified: Secondary | ICD-10-CM

## 2013-02-22 DIAGNOSIS — E785 Hyperlipidemia, unspecified: Secondary | ICD-10-CM

## 2013-02-22 MED ORDER — ROSUVASTATIN CALCIUM 5 MG PO TABS: 5.0000 mg | ORAL_TABLET | Freq: Every day | ORAL | Status: DC

## 2013-02-23 NOTE — Telephone Encounter (Signed)
Called to pharm, left on vmail 

## 2013-05-09 ENCOUNTER — Ambulatory Visit (INDEPENDENT_AMBULATORY_CARE_PROVIDER_SITE_OTHER): Payer: No Typology Code available for payment source | Admitting: Internal Medicine

## 2013-05-09 ENCOUNTER — Encounter: Payer: Self-pay | Admitting: Internal Medicine

## 2013-05-09 VITALS — BP 120/70 | HR 79 | Temp 98.7°F | Ht 69.0 in | Wt 237.0 lb

## 2013-05-09 DIAGNOSIS — K029 Dental caries, unspecified: Secondary | ICD-10-CM

## 2013-05-09 DIAGNOSIS — E039 Hypothyroidism, unspecified: Secondary | ICD-10-CM

## 2013-05-09 DIAGNOSIS — M1990 Unspecified osteoarthritis, unspecified site: Secondary | ICD-10-CM

## 2013-05-09 DIAGNOSIS — J302 Other seasonal allergic rhinitis: Secondary | ICD-10-CM

## 2013-05-09 DIAGNOSIS — I10 Essential (primary) hypertension: Secondary | ICD-10-CM

## 2013-05-09 DIAGNOSIS — R51 Headache: Secondary | ICD-10-CM

## 2013-05-09 MED ORDER — DESLORATADINE 5 MG PO TABS: 5.0000 mg | ORAL_TABLET | Freq: Every day | ORAL | Status: DC

## 2013-05-09 MED ORDER — TRAMADOL HCL 50 MG PO TBDP: 50.0000 mg | ORAL_TABLET | Freq: Two times a day (BID) | ORAL | Status: DC

## 2013-05-09 MED ORDER — BUDESONIDE 32 MCG/ACT NA SUSP: 1.0000 | Freq: Every day | NASAL | Status: DC

## 2013-05-09 NOTE — Assessment & Plan Note (Signed)
Her last TSH is available from a year ago. ReCheck TSH.

## 2013-05-09 NOTE — Patient Instructions (Addendum)
Please schedule a follow up appointment in 3 months or sooner if needed . Please bring your medication bottles with your next appointment. Please take your medicines as prescribed. I will call you if another will be abnormal.  Please try to stop smoking !

## 2013-05-09 NOTE — Progress Notes (Signed)
Subjective:   Patient ID: Emma Bonilla female   DOB: 07-12-1956 57 y.o.   MRN: [id number]  HPI: 57 year old woman with past medical history significant for hypertension, hyperlipidemia and hypothyroidism presents to the clinic for a routine followup visit.  She reports that she has been having problems with her left incisor for last month which has been more worse for last one week. She cracked her left second incisor while she was eating and was having severe toothache in the last week that has much improved today. She was taking ibuprofen  and was rinsing her mouth with oragel solution that has really helped her. Denies any swelling in the gum or face associated with it.  She is requesting medication refill.    Past Medical History  Diagnosis Date  . Hyperlipidemia   . Hypertension   . Arthritis   . Thyroid disease   . Depression   . GERD (gastroesophageal reflux disease)   . TIA (transient ischemic attack)     2010  . Heart murmur    Family History  Problem Relation Age of Onset  . Heart disease Mother   . Hypertension Mother   . Heart disease Father   . Hypertension Father   . Heart disease Sister   . Hypertension Sister   . Diabetes Sister   . Diabetes Brother    History   Social History  . Marital Status: Married    Spouse Name: N/A    Number of Children: N/A  . Years of Education: N/A   Occupational History  . Not on file.   Social History Main Topics  . Smoking status: Current Every Day Smoker -- 0.30 packs/day for 45 years    Types: Cigarettes  . Smokeless tobacco: Not on file     Comment: Wellbutrin really helped - not taking any more  . Alcohol Use: Not on file     Comment: occasional  . Drug Use: Yes    Special: Marijuana     Comment: occasional, last 3 months ago  . Sexually Active: Yes    Birth Control/ Protection: None   Other Topics Concern  . Not on file   Social History Narrative  . No narrative on file   Review of Systems: General:  Denies fever, chills, diaphoresis, appetite change and fatigue. HEENT: Denies photophobia, eye pain, redness, hearing loss, ear pain, congestion, sore throat, rhinorrhea, sneezing, mouth sores, trouble swallowing, neck pain, neck stiffness and tinnitus. Respiratory: Denies SOB, DOE, cough, chest tightness, and wheezing. Cardiovascular: Denies to chest pain, palpitations and leg swelling. Gastrointestinal: Denies nausea, vomiting, abdominal pain, diarrhea, constipation, blood in stool and abdominal distention. Genitourinary: Denies dysuria, urgency, frequency, hematuria, flank pain and difficulty urinating. Musculoskeletal: Denies myalgias, back pain, joint swelling, arthralgias and gait problem.  Skin: Denies pallor, rash and wound. Neurological: Denies dizziness, seizures, syncope, weakness, light-headedness, numbness and headaches. Hematological: Denies adenopathy, easy bruising, personal or family bleeding history. Psychiatric/Behavioral: Denies suicidal ideation, mood changes, confusion, nervousness, sleep disturbance and agitation.    Current Outpatient Medications: Current Outpatient Prescriptions  Medication Sig Dispense Refill  . amitriptyline (ELAVIL) 25 MG tablet Take 1 tablet (25 mg total) by mouth at bedtime.  30 tablet  3  . aspirin 81 MG tablet Take 1 tablet (81 mg total) by mouth daily.  30 tablet  3  . budesonide (RHINOCORT AQUA) 32 MCG/ACT nasal spray Place 2 sprays into the nose daily.  2 Bottle  3  . calcium-vitamin D (OSCAL WITH D)  500-200 MG-UNIT per tablet Take 1 tablet by mouth daily.  30 tablet  3  . desloratadine (CLARINEX) 5 MG tablet Take 1 tablet (5 mg total) by mouth daily.  90 tablet  1  . diphenhydrAMINE (BENADRYL) 25 MG tablet Take 1 tablet (25 mg total) by mouth at bedtime as needed.  30 tablet  0  . esomeprazole (NEXIUM) 40 MG capsule Take 1 capsule (40 mg total) by mouth daily before breakfast.  90 capsule  1  . levothyroxine (SYNTHROID) 100 MCG tablet Take  1 tablet (100 mcg total) by mouth daily.  90 tablet  3  . olopatadine (PATANOL) 0.1 % ophthalmic solution Place 1 drop into both eyes 2 (two) times daily.  30 mL  5  . quinapril-hydrochlorothiazide (ACCURETIC) 20-25 MG per tablet Take 1 tablet by mouth daily.  90 tablet  3  . rosuvastatin (CRESTOR) 5 MG tablet Take 1 tablet (5 mg total) by mouth at bedtime.  90 tablet  1  . TraMADol HCl 50 MG TBDP Take 50 mg by mouth 2 (two) times daily.  30 tablet  1   No current facility-administered medications for this visit.    Allergies: Allergies  Allergen Reactions  . Ibuprofen Rash  . Naproxen Sodium Rash      Objective:   Physical Exam: Filed Vitals:   05/09/13 1348  BP: 120/70  Pulse: 79  Temp: 98.7 F (37.1 C)    General: Vital signs reviewed and noted. Well-developed, well-nourished, in no acute distress; alert, appropriate and cooperative throughout examination. Head: Normocephalic, atraumatic Mouth: cracked left second incisor from evolving dental carie, caries also noted in the left molar, no signs of inflammation or abscess Lungs: Normal respiratory effort. Clear to auscultation BL without crackles or wheezes. Heart: RRR. S1 and S2 normal without gallop, murmur, or rubs. Abdomen:BS normoactive. Soft, Nondistended, non-tender.  No masses or organomegaly. Extremities: No pretibial edema.     Assessment & Plan:

## 2013-05-09 NOTE — Assessment & Plan Note (Signed)
Lab Results  Component Value Date   NA 141 01/07/2013   K 4.0 01/07/2013   CL 107 01/07/2013   CO2 27 01/07/2013   BUN 12 01/07/2013   CREATININE 0.90 01/07/2013    BP Readings from Last 3 Encounters:  05/09/13 120/70  01/07/13 122/79  06/28/12 120/81    Assessment: Hypertension control:  controlled  Progress toward goals:  at goal Barriers to meeting goals:  no barriers identified  Plan: Hypertension treatment:  continue current medications

## 2013-05-10 NOTE — Assessment & Plan Note (Signed)
She cracked her left incisor 2/2 underlying dental carie. She was also noted to have dental caries in her left molars. - Dental referral.  - Continue oral care - PRN ibuprofen

## 2013-05-12 NOTE — Addendum Note (Signed)
Addended by: Elyse Jarvis on: 05/12/2013 12:03 PM   Modules accepted: Orders

## 2013-05-18 ENCOUNTER — Other Ambulatory Visit: Payer: Self-pay | Admitting: *Deleted

## 2013-05-18 DIAGNOSIS — E039 Hypothyroidism, unspecified: Secondary | ICD-10-CM

## 2013-05-18 MED ORDER — LEVOTHYROXINE SODIUM 100 MCG PO TABS: 100.0000 ug | ORAL_TABLET | Freq: Every day | ORAL | Status: DC

## 2013-05-18 NOTE — Telephone Encounter (Signed)
Rx called in to pharmacy. 

## 2013-06-14 ENCOUNTER — Other Ambulatory Visit: Payer: Self-pay | Admitting: *Deleted

## 2013-06-14 DIAGNOSIS — K219 Gastro-esophageal reflux disease without esophagitis: Secondary | ICD-10-CM

## 2013-06-15 MED ORDER — ESOMEPRAZOLE MAGNESIUM 40 MG PO CPDR: 40.0000 mg | DELAYED_RELEASE_CAPSULE | Freq: Every day | ORAL | Status: DC

## 2013-06-15 NOTE — Telephone Encounter (Signed)
Would you pls phone in ti [facility]? Can't send electronically

## 2013-06-15 NOTE — Telephone Encounter (Signed)
Rx faxed in.

## 2013-06-23 ENCOUNTER — Ambulatory Visit: Payer: No Typology Code available for payment source

## 2013-07-26 ENCOUNTER — Ambulatory Visit: Payer: Self-pay | Admitting: Internal Medicine

## 2013-08-08 ENCOUNTER — Ambulatory Visit (INDEPENDENT_AMBULATORY_CARE_PROVIDER_SITE_OTHER): Payer: No Typology Code available for payment source | Admitting: Internal Medicine

## 2013-08-08 ENCOUNTER — Ambulatory Visit (HOSPITAL_COMMUNITY)
Admission: RE | Admit: 2013-08-08 | Discharge: 2013-08-08 | Disposition: A | Discharge status: Home / Self Care | Payer: No Typology Code available for payment source | Source: Ambulatory Visit | Attending: Internal Medicine | Admitting: Internal Medicine

## 2013-08-08 VITALS — BP 130/70 | HR 70 | Temp 98.8°F | Ht 69.0 in | Wt 239.3 lb

## 2013-08-08 DIAGNOSIS — E039 Hypothyroidism, unspecified: Secondary | ICD-10-CM

## 2013-08-08 DIAGNOSIS — G43909 Migraine, unspecified, not intractable, without status migrainosus: Secondary | ICD-10-CM

## 2013-08-08 DIAGNOSIS — K219 Gastro-esophageal reflux disease without esophagitis: Secondary | ICD-10-CM

## 2013-08-08 DIAGNOSIS — M1990 Unspecified osteoarthritis, unspecified site: Secondary | ICD-10-CM

## 2013-08-08 DIAGNOSIS — M25559 Pain in unspecified hip: Secondary | ICD-10-CM | POA: Insufficient documentation

## 2013-08-08 DIAGNOSIS — E785 Hyperlipidemia, unspecified: Secondary | ICD-10-CM

## 2013-08-08 DIAGNOSIS — I998 Other disorder of circulatory system: Secondary | ICD-10-CM | POA: Insufficient documentation

## 2013-08-08 DIAGNOSIS — I10 Essential (primary) hypertension: Secondary | ICD-10-CM

## 2013-08-08 DIAGNOSIS — J309 Allergic rhinitis, unspecified: Secondary | ICD-10-CM

## 2013-08-08 DIAGNOSIS — J302 Other seasonal allergic rhinitis: Secondary | ICD-10-CM

## 2013-08-08 DIAGNOSIS — Z23 Encounter for immunization: Secondary | ICD-10-CM

## 2013-08-08 DIAGNOSIS — T148 Other injury of unspecified body region: Secondary | ICD-10-CM

## 2013-08-08 DIAGNOSIS — W57XXXA Bitten or stung by nonvenomous insect and other nonvenomous arthropods, initial encounter: Secondary | ICD-10-CM

## 2013-08-08 MED ORDER — ROSUVASTATIN CALCIUM 5 MG PO TABS: 5.0000 mg | ORAL_TABLET | Freq: Every day | ORAL | Status: DC

## 2013-08-08 MED ORDER — CALCIUM CARBONATE-VITAMIN D 500-200 MG-UNIT PO TABS: 1.0000 | ORAL_TABLET | Freq: Every day | ORAL | Status: AC

## 2013-08-08 MED ORDER — LEVOTHYROXINE SODIUM 100 MCG PO TABS: 100.0000 ug | ORAL_TABLET | Freq: Every day | ORAL | Status: DC

## 2013-08-08 MED ORDER — BUDESONIDE 32 MCG/ACT NA SUSP: 1.0000 | Freq: Every day | NASAL | Status: DC

## 2013-08-08 MED ORDER — QUINAPRIL-HYDROCHLOROTHIAZIDE 20-25 MG PO TABS: 1.0000 | ORAL_TABLET | Freq: Every day | ORAL | Status: DC

## 2013-08-08 MED ORDER — DESLORATADINE 5 MG PO TABS: 5.0000 mg | ORAL_TABLET | Freq: Every day | ORAL | Status: DC

## 2013-08-08 MED ORDER — ESOMEPRAZOLE MAGNESIUM 40 MG PO CPDR: 40.0000 mg | DELAYED_RELEASE_CAPSULE | Freq: Every day | ORAL | Status: DC

## 2013-08-08 MED ORDER — AMITRIPTYLINE HCL 25 MG PO TABS: 25.0000 mg | ORAL_TABLET | Freq: Every day | ORAL | Status: DC

## 2013-08-08 NOTE — Patient Instructions (Addendum)
Please return to the clinic in about 6 months with Dr. Jennette Kettle may take tylenol up to 4000mg  per day for your arthritis; please do not exceed this amount I have sent your prescription to [facility]

## 2013-08-09 NOTE — Progress Notes (Signed)
Patient ID: Emma Bonilla, female   DOB: 1956/09/11, 57 y.o.   MRN: [id number]    Subjective:   Patient ID: Emma Bonilla female   DOB: 06-09-1956 57 y.o.   MRN: [id number]  HPI: Emma Bonilla is a 57 y.o. here for a routine visit. She c/o of no problems except right hip pain for the past month.  She is a new pt to me and has a PMH outlined below.  Please see problem based assessment and plan for further details.   Past Medical History  Diagnosis Date  . Hyperlipidemia   . Hypertension   . Arthritis   . Thyroid disease   . Depression   . GERD (gastroesophageal reflux disease)   . TIA (transient ischemic attack)     2010  . Heart murmur    Current Outpatient Prescriptions  Medication Sig Dispense Refill  . amitriptyline (ELAVIL) 25 MG tablet Take 1 tablet (25 mg total) by mouth at bedtime.  30 tablet  3  . aspirin 81 MG tablet Take 1 tablet (81 mg total) by mouth daily.  30 tablet  3  . budesonide (RHINOCORT AQUA) 32 MCG/ACT nasal spray Place 1 spray into the nose daily.  1 Bottle  3  . calcium-vitamin D (OSCAL WITH D) 500-200 MG-UNIT per tablet Take 1 tablet by mouth daily.  30 tablet  3  . desloratadine (CLARINEX) 5 MG tablet Take 1 tablet (5 mg total) by mouth daily.  90 tablet  1  . diphenhydrAMINE (BENADRYL) 25 MG tablet Take 1 tablet (25 mg total) by mouth at bedtime as needed.  30 tablet  0  . esomeprazole (NEXIUM) 40 MG capsule Take 1 capsule (40 mg total) by mouth daily before breakfast.  90 capsule  1  . levothyroxine (SYNTHROID) 100 MCG tablet Take 1 tablet (100 mcg total) by mouth daily.  90 tablet  3  . quinapril-hydrochlorothiazide (ACCURETIC) 20-25 MG per tablet Take 1 tablet by mouth daily.  90 tablet  3  . rosuvastatin (CRESTOR) 5 MG tablet Take 1 tablet (5 mg total) by mouth at bedtime.  90 tablet  1  . TraMADol HCl 50 MG TBDP Take 50 mg by mouth 2 (two) times daily.  30 tablet  1   No current facility-administered medications for this visit.   Family  History  Problem Relation Age of Onset  . Heart disease Mother   . Hypertension Mother   . Heart disease Father   . Hypertension Father   . Heart disease Sister   . Hypertension Sister   . Diabetes Sister   . Diabetes Brother    History   Social History  . Marital Status: Married    Spouse Name: N/A    Number of Children: N/A  . Years of Education: N/A   Social History Main Topics  . Smoking status: Current Every Day Smoker -- 0.30 packs/day for 45 years    Types: Cigarettes  . Smokeless tobacco: Not on file     Comment: Wellbutrin really helped - not taking any more  . Alcohol Use: Not on file     Comment: occasional  . Drug Use: Yes    Special: Marijuana     Comment: occasional, last 3 months ago  . Sexual Activity: Yes    Birth Control/ Protection: None   Other Topics Concern  . Not on file   Social History Narrative  . No narrative on file   Review of Systems: Pertinent positives  and negatives are noted in problem based assessment and plan.  Objective:  Physical Exam: Filed Vitals:   08/08/13 1327  BP: 130/70  Pulse: 70  Temp: 98.8 F (37.1 C)  TempSrc: Oral  Height: 5\' 9"  (1.753 m)  Weight: 108.546 kg (239 lb 4.8 oz)  SpO2: 97%   Physical Exam  Constitutional: She is oriented to person, place, and time and well-developed, well-nourished, and in no distress.  HENT:  Head: Normocephalic.  Eyes: Conjunctivae and EOM are normal. Pupils are equal, round, and reactive to light.  Neck: Neck supple.  Cardiovascular: Normal rate, regular rhythm and intact distal pulses.   Pulmonary/Chest: Effort normal and breath sounds normal.  Abdominal: Soft. Bowel sounds are normal.  Musculoskeletal:       Right hip: She exhibits decreased range of motion. She exhibits normal strength, no swelling, no crepitus and no deformity.       Left hip: She exhibits normal range of motion, normal strength, no tenderness, no swelling, no crepitus and no deformity.  Neurological:  She is alert and oriented to person, place, and time. Gait normal.  Skin: Skin is warm and dry.   Assessment & Plan:

## 2013-08-09 NOTE — Assessment & Plan Note (Signed)
Pt complains of increasing right groin pain that she has experienced for the past month.  She describes the pain is worse upon standing.  She denies any fever/chills, N/V/D/C, or any trauma.  She has a h/o OA of the knee.  On PE, she has pain with adduction, abduction, internal and external rotation. Patellar reflexes are 2+ b/l. LE sensation and strength are intact b/l.   - Xray of the right hip  - Symptomatic treatment with tylenol (not to exceed 4000mg /day) - Counseled her on weight reduction

## 2013-08-12 NOTE — Progress Notes (Signed)
I saw and evaluated the patient.  I personally confirmed the key portions of the history and exam documented by Dr. Gill and I reviewed pertinent patient test results.  The assessment, diagnosis, and plan were formulated together and I agree with the documentation in the resident's note. 

## 2013-11-21 ENCOUNTER — Ambulatory Visit: Payer: No Typology Code available for payment source

## 2013-12-23 ENCOUNTER — Other Ambulatory Visit: Payer: Self-pay | Admitting: Internal Medicine

## 2013-12-30 ENCOUNTER — Telehealth: Payer: Self-pay | Admitting: *Deleted

## 2013-12-30 NOTE — Telephone Encounter (Signed)
Fax from [facility] - requesting a refill on Patanol 0.1% soln - place 1 drop in both eyes 2 times a day  Qty 32ml. Thanks

## 2014-01-04 ENCOUNTER — Other Ambulatory Visit: Payer: Self-pay | Admitting: Internal Medicine

## 2014-01-04 DIAGNOSIS — J302 Other seasonal allergic rhinitis: Secondary | ICD-10-CM

## 2014-01-04 MED ORDER — OLOPATADINE HCL 0.1 % OP SOLN: 1.0000 [drp] | Freq: Two times a day (BID) | OPHTHALMIC | Status: DC | PRN

## 2014-01-04 NOTE — Telephone Encounter (Signed)
Prescription should be called in to pharmacy.  Thanks.

## 2014-01-05 NOTE — Telephone Encounter (Signed)
Patanol rx called to [facility].

## 2014-01-06 ENCOUNTER — Other Ambulatory Visit: Payer: Self-pay | Admitting: Internal Medicine

## 2014-01-23 ENCOUNTER — Ambulatory Visit (INDEPENDENT_AMBULATORY_CARE_PROVIDER_SITE_OTHER): Payer: No Typology Code available for payment source | Admitting: Internal Medicine

## 2014-01-23 VITALS — BP 183/93 | HR 78 | Temp 97.8°F | Ht 69.0 in | Wt 237.1 lb

## 2014-01-23 DIAGNOSIS — I10 Essential (primary) hypertension: Secondary | ICD-10-CM

## 2014-01-23 DIAGNOSIS — Z0000 Encounter for general adult medical examination without abnormal findings: Secondary | ICD-10-CM

## 2014-01-23 DIAGNOSIS — M1990 Unspecified osteoarthritis, unspecified site: Secondary | ICD-10-CM

## 2014-01-23 MED ORDER — QUINAPRIL-HYDROCHLOROTHIAZIDE 20-25 MG PO TABS: 1.0000 | ORAL_TABLET | Freq: Every day | ORAL | Status: DC

## 2014-01-23 NOTE — Progress Notes (Signed)
Subjective:   Patient ID: VEVA GRIMLEY female    DOB: [birth year] 58 y.o.    MRN: [id number] Health Maintenance Due: No health maintenance topics applied.  ________________________________________________________________  HPI: Ms.Sylvana E Fors is a 58 y.o. female here for a routine visit.  Pt has a PMH outlined below.  Please see problem-based charting assessment and plan note for further details of medical issues addressed at today's visit.  PMH: Past Medical History  Diagnosis Date  . Hyperlipidemia   . Hypertension   . Arthritis   . Thyroid disease   . Depression   . GERD (gastroesophageal reflux disease)   . TIA (transient ischemic attack)     2010  . Heart murmur     Medications: Current Outpatient Prescriptions on File Prior to Visit  Medication Sig Dispense Refill  . amitriptyline (ELAVIL) 25 MG tablet TAKE ONE TABLET BY MOUTH AT BEDTIME  30 tablet  0  . aspirin 81 MG tablet Take 1 tablet (81 mg total) by mouth daily.  30 tablet  3  . budesonide (RHINOCORT AQUA) 32 MCG/ACT nasal spray Place 1 spray into the nose daily.  1 Bottle  3  . calcium-vitamin D (OSCAL WITH D) 500-200 MG-UNIT per tablet Take 1 tablet by mouth daily.  30 tablet  3  . desloratadine (CLARINEX) 5 MG tablet Take 1 tablet (5 mg total) by mouth daily.  90 tablet  1  . diphenhydrAMINE (BENADRYL) 25 MG tablet Take 1 tablet (25 mg total) by mouth at bedtime as needed.  30 tablet  0  . esomeprazole (NEXIUM) 40 MG capsule Take 1 capsule (40 mg total) by mouth daily before breakfast.  90 capsule  1  . levothyroxine (SYNTHROID) 100 MCG tablet Take 1 tablet (100 mcg total) by mouth daily.  90 tablet  3  . olopatadine (PATANOL) 0.1 % ophthalmic solution Place 1 drop into both eyes 2 (two) times daily as needed for allergies.  5 mL  0  . quinapril-hydrochlorothiazide (ACCURETIC) 20-25 MG per tablet Take 1 tablet by mouth daily.  90 tablet  3  . rosuvastatin (CRESTOR) 5 MG tablet Take 1 tablet (5 mg total) by  mouth at bedtime.  90 tablet  1  . TraMADol HCl 50 MG TBDP Take 50 mg by mouth 2 (two) times daily.  30 tablet  1   No current facility-administered medications on file prior to visit.    Allergies: Allergies  Allergen Reactions  . Ibuprofen Rash  . Naproxen Sodium Rash    FH: Family History  Problem Relation Age of Onset  . Heart disease Mother   . Hypertension Mother   . Heart disease Father   . Hypertension Father   . Heart disease Sister   . Hypertension Sister   . Diabetes Sister   . Diabetes Brother     SH: History   Social History  . Marital Status: Married    Spouse Name: N/A    Number of Children: N/A  . Years of Education: N/A   Social History Main Topics  . Smoking status: Current Every Day Smoker -- 0.30 packs/day for 45 years    Types: Cigarettes  . Smokeless tobacco: Not on file     Comment: Wellbutrin really helped - not taking any more  . Alcohol Use: Not on file     Comment: occasional  . Drug Use: Yes    Special: Marijuana     Comment: occasional, last 3 months ago  . Sexual  Activity: Yes    Birth Control/ Protection: None   Other Topics Concern  . Not on file   Social History Narrative  . No narrative on file    Review of Systems: Constitutional: Negative for fever, chills and weight loss.  Eyes: Negative for blurred vision.  Respiratory: +cough (1 wk with greenish/yellowish sputum w/blood) and +shortness of breath.  Cardiovascular: Negative for chest pain, palpitations and +leg swelling.  Gastrointestinal: Negative for nausea, vomiting, abdominal pain, diarrhea, +constipation and negative blood in stool.  Genitourinary: Negative for dysuria, urgency and frequency.  Musculoskeletal: Negative for myalgias and back pain.  Neurological: Negative for +dizziness, weakness and +headaches.     Objective:   Vital Signs: Filed Vitals:   01/23/14 1319  BP: 183/93  Pulse: 78  Temp: 97.8 F (36.6 C)  TempSrc: Oral  Height: 5\' 9"   (1.753 m)  Weight: 237 lb 1.6 oz (107.548 kg)  SpO2: 95%      BP Readings from Last 3 Encounters:  01/23/14 183/93  08/08/13 130/70  05/09/13 120/70    Physical Exam: Constitutional: Vital signs reviewed.  Patient is well-developed and well-nourished in NAD and cooperative with exam.  Head: Normocephalic and atraumatic. Eyes, Ears, Nose, Throat: PERRL, EOMI, conjunctivae nl, no scleral icterus.  Neck: Supple. Cardiovascular: RRR, no MRG. Pulmonary/Chest: normal effort, non-tender to palpation, CTAB, no wheezes, rales, or rhonchi. Abdominal: Soft. NT/ND +BS. Neurological: A&O x3, cranial nerves II-XII are grossly intact, moving all extremities. Extremities: 2+DP b/l; no pitting edema. Skin: Warm, dry and intact. No rash.  Most Recent Laboratory Results:  CMP     Component Value Date/Time   NA 141 01/07/2013 1042   K 4.0 01/07/2013 1042   CL 107 01/07/2013 1042   CO2 27 01/07/2013 1042   GLUCOSE 93 01/07/2013 1042   BUN 12 01/07/2013 1042   CREATININE 0.90 01/07/2013 1042   CALCIUM 9.7 01/07/2013 1042   PROT 6.8 01/07/2013 1042   ALBUMIN 4.1 01/07/2013 1042   AST 13 01/07/2013 1042   ALT 10 01/07/2013 1042   ALKPHOS 78 01/07/2013 1042   BILITOT 0.3 01/07/2013 1042    CBC    Component Value Date/Time   WBC 6.4 01/07/2013 1042   RBC 4.88 01/07/2013 1042   HGB 13.6 01/07/2013 1042   HCT 41.0 01/07/2013 1042   PLT 360 01/07/2013 1042   MCV 84.0 01/07/2013 1042   MCH 27.9 01/07/2013 1042   MCHC 33.2 01/07/2013 1042   RDW 15.0 01/07/2013 1042   LYMPHSABS 3.6 03/30/2012 1039   MONOABS 0.4 03/30/2012 1039   EOSABS 0.3 03/30/2012 1039   BASOSABS 0.0 03/30/2012 1039    Lipid Panel Lab Results  Component Value Date   CHOL 152 01/07/2013   HDL 36* 01/07/2013   LDLCALC 101* 01/07/2013   TRIG 75 01/07/2013   CHOLHDL 4.2 01/07/2013    HA1C No results found for this basename: HGBA1C    Urinalysis No results found for this basename: colorurine,  appearanceur,  labspec,  phurine,  glucoseu,  hgbur,  bilirubinur,   ketonesur,  proteinur,  urobilinogen,  nitrite,  leukocytesur    Urine Microalbumin No results found for this basename: MICROALBUR,  [id number]    Imaging N/A   Assessment & Plan:   Assessment and plan was discussed and formulated with my attending.

## 2014-01-23 NOTE — Assessment & Plan Note (Signed)
Pt is UTD on health maintenance.

## 2014-01-23 NOTE — Patient Instructions (Signed)
Thank you for your visit today. Please return to the internal medicine clinic in 3 month(s) or sooner if needed.    Your current medical regimen is effective;  continue present plan and all medications. We have [facility] your blood pressure [facility]. Please be sure to bring all of your medications with you to every visit.  Should you have any new or worsening symptoms, please be sure to call the clinic at [phone].  If you believe that you are suffering from a life threatening condition or one that may result in the loss of limb or function, then you should call 911 or proceed to the nearest Emergency Department.

## 2014-01-24 LAB — HIV ANTIBODY (ROUTINE TESTING W REFLEX): HIV: NONREACTIVE

## 2014-01-24 LAB — HEPATITIS C ANTIBODY: HCV Ab: NEGATIVE

## 2014-01-24 NOTE — Assessment & Plan Note (Signed)
BP Readings from Last 3 Encounters:  01/23/14 183/93  08/08/13 130/70  05/09/13 120/70    Lab Results  Component Value Date   NA 141 01/07/2013   K 4.0 01/07/2013   CREATININE 0.90 01/07/2013    Assessment: Blood pressure control: moderately elevated Progress toward BP goal:  deteriorated; pt has been out of her BP meds for several weeks  Plan: Medications:  continue current medications; will [facility] refills to county pharmacy Other plans: encouraged to quit smoking; advised to repeat BP

## 2014-01-25 NOTE — Progress Notes (Signed)
INTERNAL MEDICINE TEACHING ATTENDING ADDENDUM - Dominic Pea, DO: I reviewed and discussed at the time of visit with the resident Dr. Gordy Levan, the patient's medical history, physical examination, diagnosis and results of tests and treatment and I agree with the patient's care as documented.

## 2014-01-25 NOTE — Assessment & Plan Note (Deleted)
Pt continues to c/o right knee pain; pt known to have DJD of knees. -encouraged weight loss -tylenol PRN for pain

## 2014-02-03 ENCOUNTER — Other Ambulatory Visit: Payer: Self-pay | Admitting: Internal Medicine

## 2014-02-03 DIAGNOSIS — Z1231 Encounter for screening mammogram for malignant neoplasm of breast: Secondary | ICD-10-CM

## 2014-02-10 ENCOUNTER — Other Ambulatory Visit: Payer: Self-pay | Admitting: Internal Medicine

## 2014-02-17 ENCOUNTER — Other Ambulatory Visit: Payer: Self-pay | Admitting: *Deleted

## 2014-02-19 MED ORDER — OLOPATADINE HCL 0.1 % OP SOLN
1.0000 [drp] | Freq: Two times a day (BID) | OPHTHALMIC | Status: DC | PRN
Start: ? — End: 2014-03-28

## 2014-02-20 ENCOUNTER — Ambulatory Visit (HOSPITAL_COMMUNITY): Payer: No Typology Code available for payment source

## 2014-02-20 NOTE — Telephone Encounter (Signed)
rx faxed in 

## 2014-03-02 ENCOUNTER — Ambulatory Visit (HOSPITAL_COMMUNITY): Payer: No Typology Code available for payment source

## 2014-03-20 ENCOUNTER — Ambulatory Visit (HOSPITAL_COMMUNITY)
Admission: RE | Admit: 2014-03-20 | Discharge: 2014-03-20 | Disposition: A | Discharge status: Home / Self Care | Payer: No Typology Code available for payment source | Source: Ambulatory Visit | Attending: Internal Medicine | Admitting: Internal Medicine

## 2014-03-20 DIAGNOSIS — Z1231 Encounter for screening mammogram for malignant neoplasm of breast: Secondary | ICD-10-CM

## 2014-03-21 ENCOUNTER — Other Ambulatory Visit: Payer: Self-pay | Admitting: *Deleted

## 2014-03-21 DIAGNOSIS — J302 Other seasonal allergic rhinitis: Secondary | ICD-10-CM

## 2014-03-21 MED ORDER — DESLORATADINE 5 MG PO TABS: 5.0000 mg | ORAL_TABLET | Freq: Every day | ORAL | Status: DC

## 2014-03-22 NOTE — Telephone Encounter (Signed)
Rx faxed in.

## 2014-03-28 ENCOUNTER — Other Ambulatory Visit: Payer: Self-pay | Admitting: *Deleted

## 2014-03-28 MED ORDER — OLOPATADINE HCL 0.1 % OP SOLN: 1.0000 [drp] | Freq: Two times a day (BID) | OPHTHALMIC | Status: DC | PRN

## 2014-03-28 NOTE — Telephone Encounter (Signed)
Last refill: 01/2014

## 2014-03-29 NOTE — Telephone Encounter (Signed)
Refill faxed in.  

## 2014-04-10 ENCOUNTER — Ambulatory Visit (INDEPENDENT_AMBULATORY_CARE_PROVIDER_SITE_OTHER): Payer: No Typology Code available for payment source | Admitting: Internal Medicine

## 2014-04-10 DIAGNOSIS — M1990 Unspecified osteoarthritis, unspecified site: Secondary | ICD-10-CM

## 2014-04-10 DIAGNOSIS — R51 Headache: Secondary | ICD-10-CM

## 2014-04-10 DIAGNOSIS — K219 Gastro-esophageal reflux disease without esophagitis: Secondary | ICD-10-CM

## 2014-04-10 DIAGNOSIS — I10 Essential (primary) hypertension: Secondary | ICD-10-CM

## 2014-04-10 DIAGNOSIS — E785 Hyperlipidemia, unspecified: Secondary | ICD-10-CM

## 2014-04-10 DIAGNOSIS — E039 Hypothyroidism, unspecified: Secondary | ICD-10-CM

## 2014-04-10 LAB — CBC
HCT: 41.7 % (ref 36.0–46.0)
HEMOGLOBIN: 14 g/dL (ref 12.0–15.0)
MCH: 28.7 pg (ref 26.0–34.0)
MCHC: 33.6 g/dL (ref 30.0–36.0)
MCV: 85.5 fL (ref 78.0–100.0)
PLATELETS: 365 10*3/uL (ref 150–400)
RBC: 4.88 MIL/uL (ref 3.87–5.11)
RDW: 14.8 % (ref 11.5–15.5)
WBC: 6.7 10*3/uL (ref 4.0–10.5)

## 2014-04-10 LAB — GLUCOSE, CAPILLARY: Glucose-Capillary: 80 mg/dL (ref 70–99)

## 2014-04-10 LAB — POCT GLYCOSYLATED HEMOGLOBIN (HGB A1C): Hemoglobin A1C: 5.6

## 2014-04-10 MED ORDER — AMITRIPTYLINE HCL 25 MG PO TABS: ORAL_TABLET | ORAL | Status: DC

## 2014-04-10 MED ORDER — ROSUVASTATIN CALCIUM 5 MG PO TABS: 5.0000 mg | ORAL_TABLET | Freq: Every day | ORAL | Status: DC

## 2014-04-10 NOTE — Progress Notes (Signed)
Patient ID: Emma Bonilla, female   DOB: Jun 01, 1956, 58 y.o.   MRN: [id number]    Subjective:   Patient ID: Emma Bonilla female    DOB: 1956/01/04 58 y.o.    MRN: [id number] Health Maintenance Due: There are no preventive care reminders to display for this patient.  _________________________________________________  HPI: Emma Bonilla is a 58 y.o. female here for a routine visit.  Pt has a PMH outlined below.  Please see problem-based charting assessment and plan note for further details of medical issues addressed at today's visit.  PMH: Past Medical History  Diagnosis Date  . Hyperlipidemia   . Hypertension   . Arthritis   . Thyroid disease   . Depression   . GERD (gastroesophageal reflux disease)   . TIA (transient ischemic attack)     2010  . Heart murmur     Medications: Current Outpatient Prescriptions on File Prior to Visit  Medication Sig Dispense Refill  . aspirin 81 MG tablet Take 1 tablet (81 mg total) by mouth daily.  30 tablet  3  . budesonide (RHINOCORT AQUA) 32 MCG/ACT nasal spray Place 1 spray into the nose daily.  1 Bottle  3  . calcium-vitamin D (OSCAL WITH D) 500-200 MG-UNIT per tablet Take 1 tablet by mouth daily.  30 tablet  3  . desloratadine (CLARINEX) 5 MG tablet Take 1 tablet (5 mg total) by mouth daily.  90 tablet  1  . diphenhydrAMINE (BENADRYL) 25 MG tablet Take 1 tablet (25 mg total) by mouth at bedtime as needed.  30 tablet  0  . levothyroxine (SYNTHROID) 100 MCG tablet Take 1 tablet (100 mcg total) by mouth daily.  90 tablet  3  . olopatadine (PATANOL) 0.1 % ophthalmic solution Place 1 drop into both eyes 2 (two) times daily as needed for allergies.  5 mL  0  . quinapril-hydrochlorothiazide (ACCURETIC) 20-25 MG per tablet Take 1 tablet by mouth daily.  90 tablet  3   No current facility-administered medications on file prior to visit.    Allergies: Allergies  Allergen Reactions  . Ibuprofen Rash  . Naproxen Sodium Rash     FH: Family History  Problem Relation Age of Onset  . Heart disease Mother   . Hypertension Mother   . Heart disease Father   . Hypertension Father   . Heart disease Sister   . Hypertension Sister   . Diabetes Sister   . Diabetes Brother     SH: History   Social History  . Marital Status: Married    Spouse Name: N/A    Number of Children: N/A  . Years of Education: N/A   Social History Main Topics  . Smoking status: Current Every Day Smoker -- 0.30 packs/day for 45 years    Types: Cigarettes  . Smokeless tobacco: Not on file     Comment: Wellbutrin really helped - not taking any more  . Alcohol Use: Not on file     Comment: occasional  . Drug Use: Yes    Special: Marijuana     Comment: occasional, last 3 months ago  . Sexual Activity: Yes    Birth Control/ Protection: None   Other Topics Concern  . Not on file   Social History Narrative  . No narrative on file    Review of Systems: Constitutional: Negative for fever, chills and weight loss, +fatigue Eyes: Negative for blurred vision.  Respiratory: Negative for cough and shortness of breath.  Cardiovascular:  Negative for chest pain, palpitations and leg swelling.  Gastrointestinal: Negative for nausea, vomiting, abdominal pain, diarrhea, + constipation and - blood in stool.  Genitourinary: Negative for dysuria, urgency and frequency.  Musculoskeletal: Negative for myalgias and back pain.  Neurological: Negative for dizziness, weakness and headaches.     Objective:   Vital Signs: There were no vitals filed for this visit.    BP Readings from Last 3 Encounters:  01/23/14 183/93  08/08/13 130/70  05/09/13 120/70    Physical Exam: Constitutional: Vital signs reviewed.  Patient is well-developed and well-nourished in NAD and cooperative with exam.  Head: Normocephalic and atraumatic. Eyes: PERRL, EOMI, conjunctivae nl, no scleral icterus.  Neck: Supple. Cardiovascular: RRR, no  MRG. Pulmonary/Chest: normal effort, non-tender to palpation, CTAB, no wheezes, rales, or rhonchi. Abdominal: Soft. NT/ND +BS. Neurological: A&O x3, cranial nerves II-XII are grossly intact, moving all extremities. Extremities: 2+DP b/l; no pitting edema. Skin: Warm, dry and intact. No rash.   Assessment & Plan:   Assessment and plan was discussed and formulated with my attending.  Patient should return to the [facility] in 6 months or sooner if needed.

## 2014-04-10 NOTE — Patient Instructions (Signed)
Thank you for your visit today.    Please return to the internal medicine clinic in 6 month(s) or sooner if needed.    I will check your blood work today.   Your current medical regimen is effective;  continue present plan and take all medications as prescribed.    I have made the following additions/changes to your medications: None  I have sent your medication refills to your pharmacy.   I have made the following referrals for you: None  You need the following test(s) for regular health maintenance: None  Please be sure to bring all of your medications with you to every visit; this includes herbal supplements, vitamins, eye drops, and any over-the-counter medications.   Should you have any questions regarding your medications and/or any new or worsening symptoms, please be sure to call the clinic at [phone].   If you believe that you are suffering from a life threatening condition or one that may result in the loss of limb or function, then you should call 911 or proceed to the nearest Emergency Department.     A healthy lifestyle and preventative care can promote health and wellness.   Maintain regular health, dental, and eye exams.  Eat a healthy diet. Foods like vegetables, fruits, whole grains, low-fat dairy products, and lean protein foods contain the nutrients you need without too many calories. Decrease your intake of foods high in solid fats, added sugars, and salt. Get information about a proper diet from your caregiver, if necessary.  Regular physical exercise is one of the most important things you can do for your health. Most adults should get at least 150 minutes of moderate-intensity exercise (any activity that increases your heart rate and causes you to sweat) each week. In addition, most adults need muscle-strengthening exercises on 2 or more days a week.   Maintain a healthy weight. The body mass index (BMI) is a screening tool to identify possible weight  problems. It provides an estimate of body fat based on height and weight. Your caregiver can help determine your BMI, and can help you achieve or maintain a healthy weight. For adults 20 years and older:  A BMI below 18.5 is considered underweight.  A BMI of 18.5 to 24.9 is normal.  A BMI of 25 to 29.9 is considered overweight.  A BMI of 30 and above is considered obese.

## 2014-04-11 ENCOUNTER — Other Ambulatory Visit: Payer: Self-pay | Admitting: *Deleted

## 2014-04-11 DIAGNOSIS — K219 Gastro-esophageal reflux disease without esophagitis: Secondary | ICD-10-CM

## 2014-04-11 LAB — LIPID PANEL
CHOL/HDL RATIO: 3.6 ratio
Cholesterol: 146 mg/dL (ref 0–200)
HDL: 41 mg/dL (ref >39)
LDL Cholesterol: 87 mg/dL (ref 0–99)
Triglycerides: 90 mg/dL (ref <150)
VLDL: 18 mg/dL (ref 0–40)

## 2014-04-11 LAB — BASIC METABOLIC PANEL WITH GFR
BUN: 11 mg/dL (ref 6–23)
CALCIUM: 9.6 mg/dL (ref 8.4–10.5)
CHLORIDE: 104 meq/L (ref 96–112)
CO2: 28 meq/L (ref 19–32)
Creat: 0.95 mg/dL (ref 0.50–1.10)
GFR, Est African American: 77 mL/min
GFR, Est Non African American: 67 mL/min
Glucose, Bld: 83 mg/dL (ref 70–99)
POTASSIUM: 4 meq/L (ref 3.5–5.3)
SODIUM: 141 meq/L (ref 135–145)

## 2014-04-11 LAB — TSH: TSH: 1.428 u[IU]/mL (ref 0.350–4.500)

## 2014-04-11 MED ORDER — ESOMEPRAZOLE MAGNESIUM 20 MG PO CPDR: 20.0000 mg | DELAYED_RELEASE_CAPSULE | Freq: Every day | ORAL | Status: DC | PRN

## 2014-04-12 ENCOUNTER — Other Ambulatory Visit: Payer: Self-pay | Admitting: *Deleted

## 2014-04-12 ENCOUNTER — Encounter: Payer: Self-pay | Admitting: Internal Medicine

## 2014-04-12 DIAGNOSIS — K219 Gastro-esophageal reflux disease without esophagitis: Secondary | ICD-10-CM

## 2014-04-12 NOTE — Telephone Encounter (Signed)
Pt has actually been on 40mg  daily, 20mg  was clicked by mistake but 40mg  was actually on med list at 5/11 visit. Please discontinue the 20mg  and i will call to health dept. Thanks, h.

## 2014-04-12 NOTE — Assessment & Plan Note (Signed)
Pt has no c/o headache today. -continue amitryptyline 25mg  qd

## 2014-04-12 NOTE — Assessment & Plan Note (Addendum)
Pain is tolerable with tylenol.  Pt has lost weight and I congratulated her on this.  R hip XR 2014 shows minimal degenerative changes.  -would continue tylenol  -continue weight loss

## 2014-04-12 NOTE — Assessment & Plan Note (Addendum)
BP is well controlled today now that she is back on her medications.   -continue accuretic 20-25

## 2014-04-12 NOTE — Assessment & Plan Note (Addendum)
Reports some symptoms as fatigue and occasional constipation.  Currently on 131mcg synthroid. -check TSH -check HA1c -continue 169mcg synthroid

## 2014-04-12 NOTE — Assessment & Plan Note (Addendum)
Pt has been taking PPI for several years and reports that she has significant symptoms without.  I would favor decreasing the dose to 20mg  instead of 40mg  and see if this controls her symptoms.  If she is able to tolerate the lower dose would favor tapering the PPI and trying an H2RA. -decrease nexium to 20mg  qd

## 2014-04-12 NOTE — Telephone Encounter (Signed)
20mg  daily is the correct dose; she had been on 40mg  but I will try a lower dose to see if her symptoms are controlled with the lower dose.  Thanks.

## 2014-04-12 NOTE — Telephone Encounter (Signed)
Rx called in 

## 2014-04-12 NOTE — Assessment & Plan Note (Signed)
Pt is on crestor 5mg  but has not had lipid panel since 2014 . -check lipid panel

## 2014-04-13 NOTE — Progress Notes (Signed)
Case discussed with Dr. Gill at time of visit.  We reviewed the resident's history and exam and pertinent patient test results.  I agree with the assessment, diagnosis, and plan of care documented in the resident's note. 

## 2014-04-14 ENCOUNTER — Encounter: Payer: Self-pay | Admitting: Internal Medicine

## 2014-05-03 ENCOUNTER — Other Ambulatory Visit: Payer: Self-pay | Admitting: *Deleted

## 2014-05-03 MED ORDER — OLOPATADINE HCL 0.1 % OP SOLN: 1.0000 [drp] | Freq: Two times a day (BID) | OPHTHALMIC | Status: DC | PRN

## 2014-05-03 NOTE — Telephone Encounter (Signed)
Rx called in to pharmacy. 

## 2014-05-30 ENCOUNTER — Other Ambulatory Visit: Payer: Self-pay | Admitting: *Deleted

## 2014-05-31 MED ORDER — OLOPATADINE HCL 0.1 % OP SOLN: 1.0000 [drp] | Freq: Two times a day (BID) | OPHTHALMIC | Status: DC | PRN

## 2014-05-31 NOTE — Telephone Encounter (Signed)
Rx called in to pharmacy. 

## 2014-06-13 ENCOUNTER — Other Ambulatory Visit: Payer: Self-pay | Admitting: *Deleted

## 2014-06-13 MED ORDER — LEVOTHYROXINE SODIUM 100 MCG PO TABS
100.0000 ug | ORAL_TABLET | Freq: Every day | ORAL | Status: DC
Start: 2014-06-13 — End: 2014-12-29

## 2014-06-15 NOTE — Telephone Encounter (Signed)
Called to [facility] 

## 2014-06-19 ENCOUNTER — Ambulatory Visit: Payer: Self-pay

## 2014-06-28 ENCOUNTER — Other Ambulatory Visit: Payer: Self-pay | Admitting: *Deleted

## 2014-06-28 DIAGNOSIS — E785 Hyperlipidemia, unspecified: Secondary | ICD-10-CM

## 2014-06-28 MED ORDER — ESOMEPRAZOLE MAGNESIUM 20 MG PO CPDR: 20.0000 mg | DELAYED_RELEASE_CAPSULE | Freq: Every day | ORAL | Status: DC | PRN

## 2014-06-28 MED ORDER — ROSUVASTATIN CALCIUM 5 MG PO TABS: 5.0000 mg | ORAL_TABLET | Freq: Every day | ORAL | Status: DC

## 2014-09-07 ENCOUNTER — Encounter: Payer: Self-pay | Admitting: Internal Medicine

## 2014-09-18 ENCOUNTER — Ambulatory Visit (INDEPENDENT_AMBULATORY_CARE_PROVIDER_SITE_OTHER): Payer: Self-pay | Admitting: Internal Medicine

## 2014-09-18 ENCOUNTER — Encounter: Payer: Self-pay | Admitting: Internal Medicine

## 2014-09-18 VITALS — BP 107/76 | HR 80 | Temp 97.7°F | Ht 67.0 in | Wt 229.9 lb

## 2014-09-18 DIAGNOSIS — Z23 Encounter for immunization: Secondary | ICD-10-CM

## 2014-09-18 DIAGNOSIS — Z0000 Encounter for general adult medical examination without abnormal findings: Secondary | ICD-10-CM

## 2014-09-18 DIAGNOSIS — M1611 Unilateral primary osteoarthritis, right hip: Secondary | ICD-10-CM

## 2014-09-18 DIAGNOSIS — I10 Essential (primary) hypertension: Secondary | ICD-10-CM

## 2014-09-18 DIAGNOSIS — Z720 Tobacco use: Secondary | ICD-10-CM

## 2014-09-18 DIAGNOSIS — S60522A Blister (nonthermal) of left hand, initial encounter: Secondary | ICD-10-CM | POA: Insufficient documentation

## 2014-09-18 NOTE — Assessment & Plan Note (Signed)
Pt still reports R hip pain that is worse on some days and better on others.  States that tylenol helps sometimes and doesn't have to take it everyday.  Was previously on tramadol but felt that didn't help her symptoms.  Has right sided sciatica.  XR of hip shows OA.  Also complains of her knees and right ankle.  Reports having gone to PT previously. -discussed and advised weight loss -will refer to SM to see if any additional therapies may help -continue tylenol not to exceed 4000mg  daily

## 2014-09-18 NOTE — Assessment & Plan Note (Signed)
Pt reports having a nodule on her left hand for "a long time."  She states it has gotten somewhat larger.  Denies any itching, change in color, or bleeding.  Reports pain sometimes when it rubs against her sleeve.  Pt has about a 0.5cm round nodule in the middle of her hand on the dorsal surface.   -referral to dermatology

## 2014-09-18 NOTE — Patient Instructions (Signed)
Thank you for your visit today.   Please return to the internal medicine clinic in 6 month(s) or sooner if needed.     Your current medical regimen is effective;  continue present plan and take all medications as prescribed.    I have made the following referrals for you:  Sports Medicine for your hip and knee Dermatology for your hand   Please be sure to bring all of your medications with you to every visit; this includes herbal supplements, vitamins, eye drops, and any over-the-counter medications.   Should you have any questions regarding your medications and/or any new or worsening symptoms, please be sure to call the clinic at [phone].   If you believe that you are suffering from a life threatening condition or one that may result in the loss of limb or function, then you should call 911 or proceed to the nearest Emergency Department.     A healthy lifestyle and preventative care can promote health and wellness.   Maintain regular health, dental, and eye exams.  Eat a healthy diet. Foods like vegetables, fruits, whole grains, low-fat dairy products, and lean protein foods contain the nutrients you need without too many calories. Decrease your intake of foods high in solid fats, added sugars, and salt. Get information about a proper diet from your caregiver, if necessary.  Regular physical exercise is one of the most important things you can do for your health. Most adults should get at least 150 minutes of moderate-intensity exercise (any activity that increases your heart rate and causes you to sweat) each week. In addition, most adults need muscle-strengthening exercises on 2 or more days a week.   Maintain a healthy weight. The body mass index (BMI) is a screening tool to identify possible weight problems. It provides an estimate of body fat based on height and weight. Your caregiver can help determine your BMI, and can help you achieve or maintain a healthy weight. For  adults 20 years and older:  A BMI below 18.5 is considered underweight.  A BMI of 18.5 to 24.9 is normal.  A BMI of 25 to 29.9 is considered overweight.  A BMI of 30 and above is considered obese.

## 2014-09-18 NOTE — Assessment & Plan Note (Signed)
-  influenza vaccine given today -pt asked about pneummoccal vaccine but not appropriate age or risk factors

## 2014-09-18 NOTE — Assessment & Plan Note (Signed)
BP Readings from Last 3 Encounters:  09/18/14 107/76  01/23/14 183/93  08/08/13 130/70    Lab Results  Component Value Date   NA 141 04/10/2014   K 4.0 04/10/2014   CREATININE 0.95 04/10/2014    Assessment: Blood pressure control: controlled Progress toward BP goal:  at goal  Plan: Medications:  continue current medications of accuretic 20-25mg  daily  Other plans: advised to quit smoking

## 2014-09-18 NOTE — Progress Notes (Signed)
Patient ID: RAYETTA VEITH, female   DOB: May 28, 1956, 58 y.o.   MRN: [id number]     Subjective:   Patient ID: JAYDALYNN OLIVERO female    DOB: 15-Apr-1956 58 y.o.    MRN: [id number] Health Maintenance Due: Health Maintenance Due  Topic Date Due  . Influenza Vaccine  07/01/2014    _________________________________________________  HPI: Ms.Jermeka E Pifer is a 58 y.o. female here for a routine visit.  Pt has a PMH outlined below.  Please see problem-based charting assessment and plan note for further details of medical issues addressed at today's visit.  PMH: Past Medical History  Diagnosis Date  . Hyperlipidemia   . Hypertension   . Arthritis   . Thyroid disease   . Depression   . GERD (gastroesophageal reflux disease)   . TIA (transient ischemic attack)     2010  . Heart murmur     Medications: Current Outpatient Prescriptions on File Prior to Visit  Medication Sig Dispense Refill  . amitriptyline (ELAVIL) 25 MG tablet TAKE ONE TABLET BY MOUTH AT BEDTIME  90 tablet  1  . aspirin 81 MG tablet Take 1 tablet (81 mg total) by mouth daily.  30 tablet  3  . budesonide (RHINOCORT AQUA) 32 MCG/ACT nasal spray Place 1 spray into the nose daily.  1 Bottle  3  . calcium-vitamin D (OSCAL WITH D) 500-200 MG-UNIT per tablet Take 1 tablet by mouth daily.  30 tablet  3  . desloratadine (CLARINEX) 5 MG tablet Take 1 tablet (5 mg total) by mouth daily.  90 tablet  1  . esomeprazole (NEXIUM) 20 MG capsule Take 1 capsule (20 mg total) by mouth daily as needed.  90 capsule  1  . levothyroxine (SYNTHROID) 100 MCG tablet Take 1 tablet (100 mcg total) by mouth daily.  90 tablet  1  . olopatadine (PATANOL) 0.1 % ophthalmic solution Place 1 drop into both eyes 2 (two) times daily as needed for allergies.  5 mL  0  . quinapril-hydrochlorothiazide (ACCURETIC) 20-25 MG per tablet Take 1 tablet by mouth daily.  90 tablet  3  . rosuvastatin (CRESTOR) 5 MG tablet Take 1 tablet (5 mg total) by mouth at bedtime.   90 tablet  1   No current facility-administered medications on file prior to visit.    Allergies: Allergies  Allergen Reactions  . Ibuprofen Rash  . Naproxen Sodium Rash    FH: Family History  Problem Relation Age of Onset  . Heart disease Mother   . Hypertension Mother   . Heart disease Father   . Hypertension Father   . Heart disease Sister   . Hypertension Sister   . Diabetes Sister   . Diabetes Brother     SH: History   Social History  . Marital Status: Married    Spouse Name: N/A    Number of Children: N/A  . Years of Education: N/A   Social History Main Topics  . Smoking status: Current Every Day Smoker -- 0.50 packs/day for 45 years    Types: Cigarettes  . Smokeless tobacco: None     Comment: Wellbutrin really helped - not taking any more  . Alcohol Use: None     Comment: occasional  . Drug Use: Yes    Special: Marijuana     Comment: occasional, last 3 months ago  . Sexual Activity: Yes    Birth Control/ Protection: None   Other Topics Concern  . None   Social  History Narrative  . None    Review of Systems: Constitutional: Negative for fever, chills and weight loss.  Eyes: Negative for blurred vision.  Respiratory: Negative for cough and shortness of breath.  Cardiovascular: Negative for chest pain, palpitations and leg swelling.  Gastrointestinal: Negative for nausea, vomiting, abdominal pain, diarrhea, constipation and blood in stool.  Genitourinary: Negative for dysuria, urgency and frequency.  Musculoskeletal: Negative for myalgias and back pain.  Neurological: Negative for dizziness, weakness and headaches.     Objective:   Vital Signs: Filed Vitals:   09/18/14 1049  BP: 107/76  Pulse: 80  Temp: 97.7 F (36.5 C)  TempSrc: Oral  Height: 5\' 7"  (1.702 m)  Weight: 229 lb 14.4 oz (104.282 kg)  SpO2: 98%      BP Readings from Last 3 Encounters:  09/18/14 107/76  01/23/14 183/93  08/08/13 130/70    Physical  Exam: Constitutional: Vital signs reviewed.  Patient is well-developed and well-nourished in NAD and cooperative with exam.  Head: Normocephalic and atraumatic. Eyes: PERRL, EOMI, conjunctivae nl, no scleral icterus.  Neck: Supple. Cardiovascular: RRR, no MRG. Pulmonary/Chest: normal effort, non-tender to palpation, CTAB, no wheezes, rales, or rhonchi. Abdominal: Soft. NT/ND +BS. Musculoskeletal: SLR +R, -L.  Patellar reflex 2+ b/l.  LE strength 5/5 b/l.   Neurological: A&O x3, cranial nerves II-XII are grossly intact, moving all extremities. Extremities: 2+DP b/l; no pitting edema. Skin: Warm, dry and intact. No rash.   Assessment & Plan:   Assessment and plan was discussed and formulated with my attending.

## 2014-09-18 NOTE — Assessment & Plan Note (Signed)
-  advised to quit smoking and has called the quitline but reports that her sister has been sick and is experiencing more stressors in her life at the moment -encouraged her to quit

## 2014-09-19 NOTE — Progress Notes (Signed)
Case discussed with Dr. Gill soon after the resident saw the patient.  We reviewed the resident's history and exam and pertinent patient test results.  I agree with the assessment, diagnosis, and plan of care documented in the resident's note. 

## 2014-09-25 ENCOUNTER — Other Ambulatory Visit: Payer: Self-pay | Admitting: *Deleted

## 2014-09-25 DIAGNOSIS — J302 Other seasonal allergic rhinitis: Secondary | ICD-10-CM

## 2014-09-26 NOTE — Telephone Encounter (Signed)
Pharmacy aware

## 2014-10-02 ENCOUNTER — Encounter: Payer: Self-pay | Admitting: Internal Medicine

## 2014-10-02 ENCOUNTER — Other Ambulatory Visit: Payer: Self-pay | Admitting: Internal Medicine

## 2014-10-03 ENCOUNTER — Other Ambulatory Visit: Payer: Self-pay | Admitting: *Deleted

## 2014-10-03 DIAGNOSIS — J302 Other seasonal allergic rhinitis: Secondary | ICD-10-CM

## 2014-10-04 MED ORDER — DESLORATADINE 5 MG PO TABS: 5.0000 mg | ORAL_TABLET | Freq: Every day | ORAL | Status: DC

## 2014-10-04 NOTE — Telephone Encounter (Signed)
Rx called in to pharmacy. 

## 2014-10-04 NOTE — Telephone Encounter (Signed)
On for HA. Has April appt Dr Gordy Levan

## 2014-10-09 ENCOUNTER — Encounter: Payer: Self-pay | Admitting: *Deleted

## 2014-10-19 ENCOUNTER — Ambulatory Visit
Admission: RE | Admit: 2014-10-19 | Discharge: 2014-10-19 | Disposition: A | Discharge status: Home / Self Care | Payer: No Typology Code available for payment source | Source: Ambulatory Visit | Attending: Sports Medicine | Admitting: Sports Medicine

## 2014-10-19 ENCOUNTER — Ambulatory Visit (INDEPENDENT_AMBULATORY_CARE_PROVIDER_SITE_OTHER): Payer: No Typology Code available for payment source | Admitting: Sports Medicine

## 2014-10-19 ENCOUNTER — Encounter: Payer: Self-pay | Admitting: Sports Medicine

## 2014-10-19 VITALS — BP 147/84 | HR 84 | Ht 67.0 in | Wt 229.0 lb

## 2014-10-19 DIAGNOSIS — M5441 Lumbago with sciatica, right side: Secondary | ICD-10-CM

## 2014-10-19 DIAGNOSIS — M5416 Radiculopathy, lumbar region: Secondary | ICD-10-CM | POA: Insufficient documentation

## 2014-10-19 MED ORDER — PREDNISONE 10 MG PO TABS: ORAL_TABLET | ORAL | Status: DC

## 2014-10-19 NOTE — Patient Instructions (Signed)
It was great to see you today! Call the office if you have any questions [phone]   Complete lumbar xray will call with results

## 2014-10-19 NOTE — Assessment & Plan Note (Addendum)
Today's treatment plan: -Obtain lumbar 2 view x-rays which patient completed -Started patient on a 12 day prednisone dose pack to attempt to calm down the acute flare of sciatica she is experiencing. -Referred patient for physical therapy for lumbar strengthening and modalities -Recommended follow-up in 4 weeks at this follow-up visit we could consider intra-articular hip injection based on patient's preferred intervention after discussing treatment options over the phone  Based on the patient's presentation today of sciatica type pain in the right leg with radiation down her right leg, I was concern for possible lumbar arthropathy or disc protrusion. Patient lumbar x-rays were not revealing of any significant disc space loss or facet arthropathy. Nor did her hip x-rays from 2014 show significant degenerative changes.  Given these findings I plan to discuss with the patient over the phone the following options.  1. Given her c/o of groin pain we could consider an in office intra-articular hip injection to see if we can further differentiate if her symptoms are coming from her back versus her hip. There is a possibility for labral pathology and the intra-articular injection would provide therapeutic and clinical differentiation.  2. Proceed with a lumbar MRI to r/o lumbar pathology which is possible but less likely given the fairly stable alignment and no significant OA of her lumbar x-ray.

## 2014-10-19 NOTE — Progress Notes (Signed)
Emma Bonilla - 58 y.o. female MRN [id number]  Date of birth: 1956-09-17  SUBJECTIVE:  Including CC & ROS.  The patient is a obese 58 year old African-American female who presents today with chronic hip pain for 17 years that has acutely worsened over the past months to weeks. She describes several locations to her pain including her right buttocks, lateral hip, groin with radiation of pain down her right leg all the way into her toes. She describes the pain as a constant dull ache or pulling sensation with occasional numbness and tingling down her leg. She also describes occasional sharp pain within her right hip. Patient has been taking Tylenol intermittently but does not always help. She is previously taking tramadol with no significant relief. Patient is also on amitriptyline 25 mg at bedtime it is unclear from her medical record how long she's been on this medication however I do think this is an effective medication to help with some of her neuropathic type pain. She has had x-rays of her right hip from 2014 that showed mild osteoarthritis. She denies ever having lumbar x-rays, lumbar MRI, or any hip injections. She has done some physical therapy in the past but did not find this to be effective.   ROS: Review of systems otherwise negative except for information present in HPI  HISTORY: Past Medical, Surgical, Social, and Family History Reviewed & Updated per EMR. Pertinent Historical Findings include: Obesity Hypothyroidism Hyperlipidemia  DATA REVIEWED: Personally reviewed patient's 2 view right hip from 2014 that showed mild degenerative changes but no significant osteoarthritis or bone spurring.  Personally reviewed 3 view lumbar spine that was completed today which showed normal disc space height with no vertebral collapse, mild spondylosis, no significant facet stenosis or bone spurring present.  PHYSICAL EXAM:  VS: BP:(!) 147/84 mmHg  HR:84bpm  TEMP: ( )  RESP:   HT:5\' 7"  (170.2  cm)   WT:229 lb (103.874 kg)  BMI:35.9 LOW BACK EXAM: General: well nourished Skin of LE: warm; dry, no rashes, lesions, ecchymosis or erythema. Vascular: radial pulses 2+ bilaterally Neurologically: Sensation to light touch lower extremities equal and intact bilaterally.  Observation: Normal curvature and no kyphosis or lordosis, no scoliosis.  Iliac crests are symmetric, shoulders line symmetrically Palpation:  No step off defects noted in the thoracic or lumbar spine.   No muscle spasm or tenderness along the paraspinal musculature of the thoracic Moderate muscle spasm and tenderness along the paraspinal musculature of the lumbar spine. Range of motion:  Normal flexion on forward bending, mild pain with extension, no pain with one leg hyperextension. Neuromuscular: Moderate numbness and tingling pain with straight leg raise on right negative on left, normal gait walking with walking on heels or walking on toes, normal tandem gait.  Nerve root intervention  L2 and L3: Normal hip flexion with no weakness. L2, L3, L4: Normal hip abduction bilaterally.  Normal patellar DTR +1 bilaterally L4, L5, S1: Normal hip abduction bilaterally S1 and S2: Normal ankle plantar-flexion bilaterally.  Normal Achilles tendon DTR +1 bilaterally L5: Normal extensor hallucis longus bilaterally  Right Hip Exam: Positive anterior hip joint tenderness No tenderness over the pubic synthesis,  No tenderness of the the ASIS, no pain over the iliac crest,  No tenderness of the lateral greater trochanter or bursa, No PSIS tenderness or SI joint pain ROM: Normal Hip motion in flexion, extension, internal and external rotation Pain with hip grinding on the right negative on the left Muscle strength: No pain generalized weakness  with iliopsoas flexion, rectus femoral flexion, hamstring and gluteal extension, hip adduction or abduction.  No pain global weakness with gluteus medius and minimus hip abduction, abdominal  muscle contraction forward or oblique positions. Strength for muscle testing in all plane was symmetric with global weakness.   ASSESSMENT & PLAN: See problem based charting & AVS for pt instructions.  We spent greater than 50% of our 30 minute office visit in counseling education regarding the patient current clinical problem, risks and benefits of treatment options, and recommend plans for treatment or further evaluation

## 2014-10-24 ENCOUNTER — Telehealth: Payer: Self-pay | Admitting: Sports Medicine

## 2014-10-24 DIAGNOSIS — M5416 Radiculopathy, lumbar region: Secondary | ICD-10-CM

## 2014-10-24 NOTE — Telephone Encounter (Signed)
Based on the patient's presentation today of sciatica type pain in the right leg with radiation down her right leg, I was concern for possible lumbar arthropathy or disc protrusion. Patient lumbar x-rays were not revealing of any significant disc space loss or facet arthropathy. Nor did her hip x-rays from 2014 show significant degenerative changes.  Given these findings I plan to discuss with the patient over the phone the following options.  1. Given her c/o of groin pain we could consider an in office intra-articular hip injection to see if we can further differentiate if her symptoms are coming from her back versus her hip. There is a possibility for labral pathology and the intra-articular injection would provide therapeutic and clinical differentiation.  2. Proceed with a lumbar MRI to r/o lumbar pathology which is possible but less likely given the fairly stable alignment and no significant OA of her lumbar x-ray.

## 2014-10-30 ENCOUNTER — Other Ambulatory Visit: Payer: Self-pay | Admitting: *Deleted

## 2014-10-31 MED ORDER — QUINAPRIL-HYDROCHLOROTHIAZIDE 20-25 MG PO TABS: 1.0000 | ORAL_TABLET | Freq: Every day | ORAL | Status: DC

## 2014-11-01 NOTE — Telephone Encounter (Signed)
Rx called in 

## 2014-11-03 ENCOUNTER — Ambulatory Visit: Payer: No Typology Code available for payment source | Attending: Family Medicine | Admitting: Physical Therapy

## 2014-11-03 DIAGNOSIS — M5441 Lumbago with sciatica, right side: Secondary | ICD-10-CM | POA: Insufficient documentation

## 2014-11-03 NOTE — Patient Instructions (Signed)
Back Hyperextension: Using Arms   Lying face down with arms bent, inhale. Then while exhaling, straighten arms. Hold _2-3___ seconds. Slowly return to starting position. Repeat _10___ times per set. Do _1___ sets per session. Do __2-3__ sessions per day.  Copyright  VHI. All rights reserved.   Laureen Abrahams, PT, DPT 11/03/2014 10:40 AM  [facility] [address], [place] [address]  [phone] (office) [phone] (fax)

## 2014-11-03 NOTE — Therapy (Signed)
Outpatient Rehabilitation [facility] [address] [place], [place], [address] Phone: [phone]   Fax:  [phone]  Physical Therapy Evaluation  Patient Details  Name: Emma Bonilla MRN: [id number] Date of Birth: [birth year]  Encounter Date: 11/03/2014      PT End of Session - 11/03/14 1048    Visit Number 1   Number of Visits 8   Date for PT Re-Evaluation 01/02/15   PT Start Time 0300   PT Stop Time 1048   PT Time Calculation (min) 33 min   Activity Tolerance Patient tolerated treatment well   Behavior During Therapy [facility] for tasks assessed/performed      Past Medical History  Diagnosis Date  . Hyperlipidemia   . Hypertension   . Arthritis   . Thyroid disease   . Depression   . GERD (gastroesophageal reflux disease)   . TIA (transient ischemic attack)     2010  . Heart murmur     Past Surgical History  Procedure Laterality Date  . Dilation and curettage of uterus      30 years ago    LMP 02/11/2011  Visit Diagnosis:  Acute back pain with sciatica, right - Plan: PT plan of care cert/re-cert      Subjective Assessment - 11/03/14 1020    Symptoms Pt. is a 58 y/o female who presents to OPPT for R hip pain which began 6-9 months ago.  Pt reports a "catch" or "locking" sensation and unable to recall cause of onset.     Limitations Walking   How long can you walk comfortably? unlimited unless pt experiences catch/locking sensation   Diagnostic tests R hip x-ray: mild OA, back x-ray awaiting results   Currently in Pain? Yes   Pain Score 1   increases to 8-9/10   Pain Location Hip   Pain Orientation Right   Pain Descriptors / Indicators Aching   Pain Type Chronic pain   Pain Onset More than a month ago   Pain Frequency Intermittent   Aggravating Factors  unknown   Pain Relieving Factors rest, sometimes walking, heat   Multiple Pain Sites Yes   Pain Score 2   Pain Location Knee   Pain Orientation Right   Pain Descriptors / Indicators  Aching;Dull   Pain Onset Other (Comment)  OA pain          [facility] PT Assessment - 11/03/14 1025    Assessment   Medical Diagnosis R hip pain   Onset Date 04/03/14  approximate   Next MD Visit 11/16/14   Prior Therapy for knee/ankle   Precautions   Precautions None   Restrictions   Weight Bearing Restrictions No   Balance Screen   Has the patient fallen in the past 6 months Yes   How many times? 2  due to hip   Has the patient had a decrease in activity level because of a fear of falling?  Yes   Is the patient reluctant to leave their home because of a fear of falling?  No   Home Environment   Living Enviornment Private residence   Living Arrangements Spouse/significant other;Children;Other relatives  daughter/grandson   Available Help at Discharge Family   Type of [place] to enter   Entrance Stairs-Number of Steps 2   Entrance Stairs-Rails Left;Right   Home Layout Two level;Able to live on main level with bedroom/bathroom   Alternate Level Stairs-Number of Steps 10  to basement (laundry)   Alternate  Level Stairs-Rails Right   Prior Function   Level of Independence Independent with gait;Independent with transfers   Cognition   Overall Cognitive Status Within Functional Limits for tasks assessed   Observation/Other Assessments   Observations unable to palpate pain: pt reports pain deeper than palpation   Focus on Therapeutic Outcomes (FOTO)  FOTO 73 (27% limited); predicted 75 (25% limited)   Posture/Postural Control   Posture/Postural Control Postural limitations   Postural Limitations Rounded Shoulders;Forward head;Decreased lumbar lordosis   AROM   Overall AROM Comments WNL without pain   Strength   Right Hip Flexion 4/5   Right Hip Extension 4/5   Right Hip ABduction 4/5   Left Hip Flexion 4/5   Left Hip Extension 4/5   Left Hip ABduction 4/5   Right Knee Flexion 4/5  with hip pain   Right Knee Extension 4/5   Left Knee Flexion 4/5    Left Knee Extension 4/5   Special Tests    Special Tests Lumbar;Hip Special Tests  repeated prone ext improved symptoms at SIJ   Hip Special Tests  Saralyn Pilar (FABER) Test;Ober's Test;Piriformis Test   Saralyn Pilar [facility]) Test   Findings Positive   Side Right   Ober's Test   Findings Negative   Side Right   Piriformis Test   Findings Positive   Side  Right   other   Findings Positive   Side Right   Comments SLR   Ambulation/Gait   Ambulation/Gait --  WNL            PT Education - 19-Nov-2014 1048    Education provided Yes   Education Details HEP   Person(s) Educated Patient   Methods Explanation;Demonstration;Handout   Comprehension Verbalized understanding;Returned demonstration;Need further instruction            PT Long Term Goals - Nov 19, 2014 1051    PT LONG TERM GOAL #1   Title independent with HEP (12/01/14)   Time 4   Period Weeks   Status New   PT LONG TERM GOAL #2   Title report pain < 3/10 with walking (12/01/14)   Time 4   Period Weeks   Status New   PT LONG TERM GOAL #3   Title verbalize understanding of posture/body mechanics to reduce risk of reinjury (12/01/14)   Time 4   Period Weeks   Status New          Plan - 11-19-14 1049    Clinical Impression Statement Pt presents to OPPT with R hip pain and weakness.  Will benefit from PT to maximize function and decrease pain.   Pt will benefit from skilled therapeutic intervention in order to improve on the following deficits Decreased strength;Pain;Postural dysfunction;Difficulty walking   Rehab Potential Good   PT Frequency 2x / week   PT Duration 4 weeks   PT Treatment/Interventions ADLs/Self Care Home Management;Cryotherapy;Electrical Stimulation;Functional mobility training;Neuromuscular re-education;Dry needling;Manual techniques;Gait training;Ultrasound;DME Instruction;Traction;Therapeutic exercise;Passive range of motion;Patient/family education;Therapeutic activities;Moist Heat   PT Next Visit  Plan review HEP; add stretching, core stability   Consulted and Agree with Plan of Care Patient          G-Codes - Nov 19, 2014 1053    Functional Assessment Tool Used FOTO 27% limited   Functional Limitation Mobility: Walking and moving around   Mobility: Walking and Moving Around Current Status (D7824) At least 20 percent but less than 40 percent impaired, limited or restricted   Mobility: Walking and Moving Around Goal Status (M3536) At least 20 percent  but less than 40 percent impaired, limited or restricted                            Problem List Patient Active Problem List   Diagnosis Date Noted  . Chronic right sided lumbar radiculopathy 10/19/2014  . Right-sided low back pain with right-sided sciatica 10/19/2014  . Blister of left hand 09/18/2014  . Seasonal allergies 04/03/2012  . Hypothyroidism 03/30/2012  . Carpal tunnel syndrome on both sides 11/21/2011  . Osteoarthritis 11/07/2011  . Headache 09/15/2011  . Health care maintenance 09/01/2011  . Hypertension 07/25/2011  . GERD (gastroesophageal reflux disease) 07/25/2011  . Tobacco abuse 07/25/2011  . Hyperlipidemia 07/25/2011   Laureen Abrahams, PT, DPT 11/03/2014 10:56 AM  [facility] [address] [place] [address]  [phone] (office) [phone] (fax)

## 2014-11-07 ENCOUNTER — Ambulatory Visit: Payer: No Typology Code available for payment source | Admitting: Physical Therapy

## 2014-11-07 DIAGNOSIS — M5441 Lumbago with sciatica, right side: Secondary | ICD-10-CM

## 2014-11-07 NOTE — Therapy (Addendum)
Outpatient Rehabilitation [facility] [address] [place], [place], [address] Phone: [phone]   Fax:  [phone]  Physical Therapy Treatment  Patient Details  Name: Emma Bonilla MRN: [id number] Date of Birth: 1956/09/12  Encounter Date: 11/07/2014      PT End of Session - 11/07/14 1202    Visit Number 2   Number of Visits 8   Date for PT Re-Evaluation 01/02/15   PT Start Time 1100   PT Stop Time 1200   PT Time Calculation (min) 60 min   Equipment Utilized During Treatment Other (comment)  strap for stretching   Activity Tolerance Patient tolerated treatment well   Behavior During Therapy [facility] for tasks assessed/performed      Past Medical History  Diagnosis Date  . Hyperlipidemia   . Hypertension   . Arthritis   . Thyroid disease   . Depression   . GERD (gastroesophageal reflux disease)   . TIA (transient ischemic attack)     2010  . Heart murmur     Past Surgical History  Procedure Laterality Date  . Dilation and curettage of uterus      30 years ago    LMP 02/11/2011  Visit Diagnosis:  Acute back pain with sciatica, right      Subjective Assessment - 11/07/14 1103    Symptoms Pt reports pain 2/10 in R anterior hip pain from 3/10.     Pertinent History Pt has had R ant hip pain for 6-9 months   Limitations Walking;Standing   How long can you stand comfortably? unlimited   How long can you walk comfortably? unlimited unless catch/locking sensation   Currently in Pain? Yes   Pain Score 2    Pain Location Hip  Pt points to inguinal crease anterior hip   Pain Orientation Right   Pain Descriptors / Indicators Aching   Pain Type Acute pain   Pain Score 6   Pain Type Acute pain   Pain Location Back  quadratus lumborum   Pain Orientation Right   Pain Descriptors / Indicators Sharp;Nagging            [facility] Adult PT Treatment/Exercise - 11/07/14 1224    Posture/Postural Control   Posture/Postural Control Postural limitations   Postural Limitations Rounded Shoulders;Forward head;Decreased lumbar lordosis;Increased thoracic kyphosis   Lumbar Exercises: Stretches   Press Ups 5 reps   Quadruped Mid Back Stretch 3 reps;30 seconds   Lumbar Exercises: Standing   Other Standing Lumbar Exercises standing ext  standing lumbar extension with counter 5 reps   Lumbar Exercises: Supine   Ab Set 10 reps;Other (comment)  10 sec   AB Set Limitations needs reinforcement   Clam 15 reps;3 seconds   Clam Limitations needs TC to maintain neutral pelvis   Bent Knee Raise 15 reps;3 seconds   Lumbar Exercises: Quadruped   [place] 10 reps   Knee/Hip Exercises: Stretches   Hip Flexor Stretch 3 reps;30 seconds  Right hip using strap to maintain stretch   Modalities   Modalities Moist Heat   Moist Heat Therapy   Number Minutes Moist Heat 15 Minutes   Moist Heat Location --  Anterior hip in prone          PT Education - 11/07/14 1201    Education provided Yes   Education Details Pt instructed in posture and body mechanics and given handouts and given HEP   Person(s) Educated Patient   Methods Explanation;Demonstration;Tactile cues   Comprehension Verbalized understanding;Returned demonstration;Verbal cues  required;Tactile cues required              Plan - 11/07/14 1210    Clinical Impression Statement Pt with initial R quadratus/R anterior pain(proximal quadriped ) pain distal to ASIS.  Pt after exercise and moist heat and stretching was 0/10 pain   Pt will benefit from skilled therapeutic intervention in order to improve on the following deficits Decreased strength;Pain;Postural dysfunction;Difficulty walking   Rehab Potential Good   PT Frequency 2x / week   PT Duration 4 weeks   PT Treatment/Interventions ADLs/Self Care Home Management;Cryotherapy;Electrical Stimulation;Functional mobility training;Neuromuscular re-education;Dry needling;Manual techniques;Gait training;Ultrasound;DME  Instruction;Traction;Therapeutic exercise;Passive range of motion;Patient/family education;Therapeutic activities;Moist Heat   PT Next Visit Plan Pt review HEP and add core abdominal strength as able. Review Posture training   PT Home Exercise Plan Pt given exercise in handout with posture/body mechanics. See Pt instruction   Consulted and Agree with Plan of Care Patient                               Problem List Patient Active Problem List   Diagnosis Date Noted  . Chronic right sided lumbar radiculopathy 10/19/2014  . Right-sided low back pain with right-sided sciatica 10/19/2014  . Blister of left hand 09/18/2014  . Seasonal allergies 04/03/2012  . Hypothyroidism 03/30/2012  . Carpal tunnel syndrome on both sides 11/21/2011  . Osteoarthritis 11/07/2011  . Headache 09/15/2011  . Health care maintenance 09/01/2011  . Hypertension 07/25/2011  . GERD (gastroesophageal reflux disease) 07/25/2011  . Tobacco abuse 07/25/2011  . Hyperlipidemia 07/25/2011    Dorothea Ogle PT 11/07/2014, 12:49 PM

## 2014-11-07 NOTE — Patient Instructions (Signed)
Posture Tips DO: - stand tall and erect - keep chin tucked in - keep head and shoulders in alignment - check posture regularly in mirror or large window - pull head back against headrest in car seat;  Change your position often.  Sit with lumbar support. DON'T: - slouch or slump while watching TV or reading - sit, stand or lie in one position  for too long;  Sitting is especially hard on the spine so if you sit at a desk/use the computer, then stand up often!   Copyright  VHI. All rights reserved.  Posture - Standing   Good posture is important. Avoid slouching and forward head thrust. Maintain curve in low back and align ears over shoul- ders, hips over ankles.  Pull your belly button in toward your back bone.   Copyright  VHI. All rights reserved.  Posture - Sitting   Sit upright, head facing forward. Try using a roll to support lower back. Keep shoulders relaxed, and avoid rounded back. Keep hips level with knees. Avoid crossing legs for long periods.        Lie with hips and knees bent. Slowly inhale, and then exhale. Pull navel toward spine and tighten pelvic floor. Hold for __10_ seconds. Continue to breathe in and out during hold. Rest for _10__ seconds. Repeat __10_ times. Do __2-3_ times a day.   Copyright  VHI. All right   Copyright  VHI. All rights reserved.  Knee Fold   Lie on back, legs bent, arms by sides. Exhale, lifting knee to chest. Inhale, returning. Keep abdominals flat, navel to spine. Repeat __10__ times, alternating legs. Do __2__ sessions per day.  Copyright  VHI. All rights reserved.  Knee Drop   Keep pelvis stable. Without rotating hips, slowly drop knee to side, pause, return to center, bring knee across midline toward opposite hip. Feel obliques engaging. Repeat for ___10_ times each leg.     .    Angry Cat, All Fours   Kneel on hands and knees. Tuck chin and tighten stomach. Exhale and round back upward. Inhale and arch back  downward. Hold each position __3_ seconds. Repeat __10_ times per session. Do1-2 ___ sessions per day.  Copyright  VHI. All rights reserv        Flexion   Sitting on knees, fold body over legs and relax head and arms on floor. Extend arms as far forward as possible. Hold 30____ seconds. Repeat ___2-3_ times. Do __1-2__ sessions per day. Also perform side to side and maintain hold for quadratus stretch.   Quads / HF, Prone  use a strap   Lie face down, knees together. Grasp one ankle with same-side hand. Use towel if needed to reach. Gently pull foot toward buttock. Hold ___ seconds. Repeat ___ times per session. Do ___ sessions per day.   Sleeping on Back  Place pillow under knees. A pillow with cervical support and a roll around waist are also helpful. Copyright  VHI. All rights reserved.  Sleeping on Side Place pillow between knees. Use cervical support under neck and a roll around waist as needed. Copyright  VHI. All rights reserved.   Sleeping on Stomach   If this is the only desirable sleeping position, place pillow under lower legs, and under stomach or chest as needed.  Posture - Sitting   Sit upright, head facing forward. Try using a roll to support lower back. Keep shoulders relaxed, and avoid rounded back. Keep hips level with knees. Avoid crossing  legs for long periods. Stand to Sit / Sit to Stand   To sit: Bend knees to lower self onto front edge of chair, then scoot back on seat. To stand: Reverse sequence by placing one foot forward, and scoot to front of seat. Use rocking motion to stand up.   Work Height and Reach  Ideal work height is no more than 2 to 4 inches below elbow level when standing, and at elbow level when sitting. Reaching should be limited to arm's length, with elbows slightly bent.  Bending  Bend at hips and knees, not back. Keep feet shoulder-width apart.    Posture - Standing   Good posture is important. Avoid slouching and  forward head thrust. Maintain curve in low back and align ears over shoul- ders, hips over ankles.  Alternating Positions   Alternate tasks and change positions frequently to reduce fatigue and muscle tension. Take rest breaks. Computer Work   Position work to Programmer, multimedia. Use proper work and seat height. Keep shoulders back and down, wrists straight, and elbows at right angles. Use chair that provides full back support. Add footrest and lumbar roll as needed.  Getting Into / Out of Car  Lower self onto seat, scoot back, then bring in one leg at a time. Reverse sequence to get out.  Dressing  Lie on back to pull socks or slacks over feet, or sit and bend leg while keeping back straight.    Housework - Sink  Place one foot on ledge of cabinet under sink when standing at sink for prolonged periods.   Pushing / Pulling  Pushing is preferable to pulling. Keep back in proper alignment, and use leg muscles to do the work.  Deep Squat   Squat and lift with both arms held against upper trunk. Tighten stomach muscles without holding breath. Use smooth movements to avoid jerking.  Avoid Twisting   Avoid twisting or bending back. Pivot around using foot movements, and bend at knees if needed when reaching for articles.  Carrying Luggage   Distribute weight evenly on both sides. Use a cart whenever possible. Do not twist trunk. Move body as a unit.   Lifting Principles .Maintain proper posture and head alignment. .Slide object as close as possible before lifting. .Move obstacles out of the way. .Test before lifting; ask for help if too heavy. .Tighten stomach muscles without holding breath. .Use smooth movements; do not jerk. .Use legs to do the work, and pivot with feet. .Distribute the work load symmetrically and close to the center of trunk. .Push instead of pull whenever possible.   Ask For Help   Ask for help and delegate to others when possible. Coordinate your  movements when lifting together, and maintain the low back curve.  Log Roll   Lying on back, bend left knee and place left arm across chest. Roll all in one movement to the right. Reverse to roll to the left. Always move as one unit. Housework - Sweeping  Use long-handled equipment to avoid stooping.   Housework - Wiping  Position yourself as close as possible to reach work surface. Avoid straining your back.  Laundry - Unloading Wash   To unload small items at bottom of washer, lift leg opposite to arm being used to reach.  Palmer close to area to be raked. Use arm movements to do the work. Keep back straight and avoid twisting.     Cart  When reaching into cart with  one arm, lift opposite leg to keep back straight.   Getting Into / Out of Bed  Lower self to lie down on one side by raising legs and lowering head at the same time. Use arms to assist moving without twisting. Bend both knees to roll onto back if desired. To sit up, start from lying on side, and use same move-ments in reverse. Housework - Vacuuming  Hold the vacuum with arm held at side. Step back and forth to move it, keeping head up. Avoid twisting.   Laundry - IT consultant so that bending and twisting can be avoided.   Laundry - Unloading Dryer  Squat down to reach into clothes dryer or use a reacher.  Gardening - Weeding / Probation officer or Kneel. Knee pads may be helpful.

## 2014-11-09 ENCOUNTER — Ambulatory Visit: Payer: No Typology Code available for payment source | Admitting: Physical Therapy

## 2014-11-09 DIAGNOSIS — M5441 Lumbago with sciatica, right side: Secondary | ICD-10-CM

## 2014-11-09 NOTE — Patient Instructions (Addendum)
  Hip Flexion / Knee Extension: Straight-Leg Raise (Eccentric)   Lie on back. Lift leg with knee straight. Slowly lower leg for 3-5 seconds. __10 -15_ reps per set, _2__ sets per day, _5-7__ days per week. Lower like elevator, stopping at each floor. Add _2__ lbs when you achieve 20___ repetitions. Rest on elbows. Rest on straight arms.  ABDUCTION: Side-Lying (Active)   Lie on left side, top leg straight. Raise top leg as far as possible. Use __2_ lbs. Complete __2_ sets of _10-15__ repetitions. Perform 1-2___ sessions per day.  http://gtsc.exer.us/94   (Home) Extension: Hip   With support under abdomen, tighten stomach. Lift right leg in line with body. Do not hyperextend. Alternate legs. Repeat __10-15__ times per set. Do 2____ sets per session. Do 1-2____ sessions per day.   To progress marching exercise while lying on your back,  Maintain table top position as shown in clinic without arching back and perform 10 marching with opposite leg  Hip Extension stretch while standing-  Remember to hold onto chair and stretch for 30 to 60 seconds(remember motor oil vs. Crabmeat.  Do 2 to 3 times

## 2014-11-09 NOTE — Therapy (Signed)
Outpatient Rehabilitation [facility] [address] [place], [place], [address] Phone: [phone]   Fax:  [phone]  Physical Therapy Treatment  Patient Details  Name: Emma Bonilla MRN: [id number] Date of Birth: November 01, 1956  Encounter Date: 11/09/2014      PT End of Session - 11/09/14 1147    Visit Number 3   Number of Visits 8      Past Medical History  Diagnosis Date  . Hyperlipidemia   . Hypertension   . Arthritis   . Thyroid disease   . Depression   . GERD (gastroesophageal reflux disease)   . TIA (transient ischemic attack)     2010  . Heart murmur     Past Surgical History  Procedure Laterality Date  . Dilation and curettage of uterus      30 years ago    LMP 02/11/2011  Visit Diagnosis:  No diagnosis found.      Subjective Assessment - 11/09/14 1114    Symptoms Pt reports doing exercise HEP 2 to 3 times a day since last visit. Grand children "helped " her do exercises   Pertinent History Pt has had R ant hip pain for 6-9 months.     How long can you sit comfortably? unlimited   How long can you stand comfortably? 2 hours, so far no clicking since last visit   How long can you walk comfortably? Walked for 15 minutes with no catching/locking sensation   Patient Stated Goals have no pain with walking and standing   Currently in Pain? No/denies   Pain Score 0-No pain   Pain Location Hip   Pain Orientation Right   Pain Onset In the past 7 days   Pain Relieving Factors Exercises help   Pain Score 0   Pain Location Back   Pain Orientation Right            [facility] Adult PT Treatment/Exercise - 11/09/14 1120    Posture/Postural Control   Posture/Postural Control Postural limitations   Postural Limitations Rounded Shoulders;Forward head;Decreased lumbar lordosis;Increased thoracic kyphosis  Pt verbalized understanding and demonstrated good posture   Lumbar Exercises: Stretches   Press Ups 5 reps   Quadruped Mid Back Stretch 3  reps;30 seconds   Lumbar Exercises: Standing   Other Standing Lumbar Exercises standing ext  standing lumbar extension with counter 5 reps   Other Standing Lumbar Exercises Hip ext  using chair for balance R hip ext/knee flex for 30 sec hold    Lumbar Exercises: Supine   Ab Set 10 reps;Other (comment)  10 sec   AB Set Limitations needs reinforcement   Clam 15 reps;3 seconds   Clam Limitations needs TC to maintain neutral pelvis   Bent Knee Raise 15 reps;3 seconds  progressed by utilizing one LE in tabletop   Straight Leg Raise 15 reps  2 lb wt with eccentric control   Lumbar Exercises: Sidelying   Hip Abduction 10 reps   Hip Abduction Weights (lbs) 2  VC for proper execution   Lumbar Exercises: Prone   Straight Leg Raise 10 reps   with 2 lb weights   Lumbar Exercises: Quadruped   Madcat/Old Horse 10 reps   Knee/Hip Exercises: Stretches   Hip Flexor Stretch 3 reps;30 seconds  Right hip using strap to maintain stretch   Modalities   Modalities --   Moist Heat Therapy   Moist Heat Location --  Anterior hip in prone          PT  Education - 11/09/14 1143    Education provided Yes   Education Details Added to HEP strengthening LE and reviewed Posture/Body mechanics review    Person(s) Educated Patient   Methods Explanation;Demonstration;Tactile cues;Verbal cues   Comprehension Verbalized understanding;Returned demonstration            PT Long Term Goals - 11/09/14 1148    PT LONG TERM GOAL #1   Title independent with HEP (12/01/14)   Baseline Pt feels she knows her exercises and can continue at home    Time 4   Period Weeks   Status Achieved   PT LONG TERM GOAL #2   Title report pain < 3/10 with walking (12/01/14)  Pt has 0/10 pain with R back and R hip   Time 4   Period Weeks   Status Achieved   PT LONG TERM GOAL #3   Title verbalize understanding of posture/body mechanics to reduce risk of reinjury (12/01/14)   Time 4   Period Weeks   Status Achieved           Plan - 11/09/14 1234    Clinical Impression Statement Pt is independent with HEP and verbalizes knowledge of posture /body mechanics .  Pt also has no pain in R back and R hip   PT Next Visit Plan D/C secondary to achieved all goals   PT Home Exercise Plan Pt given handout for HEP and Posture/Body mechanics   Consulted and Agree with Plan of Care Patient                               Problem List Patient Active Problem List   Diagnosis Date Noted  . Chronic right sided lumbar radiculopathy 10/19/2014  . Right-sided low back pain with right-sided sciatica 10/19/2014  . Blister of left hand 09/18/2014  . Seasonal allergies 04/03/2012  . Hypothyroidism 03/30/2012  . Carpal tunnel syndrome on both sides 11/21/2011  . Osteoarthritis 11/07/2011  . Headache 09/15/2011  . Health care maintenance 09/01/2011  . Hypertension 07/25/2011  . GERD (gastroesophageal reflux disease) 07/25/2011  . Tobacco abuse 07/25/2011  . Hyperlipidemia 07/25/2011    Voncille Lo SPT 11/09/2014, 12:39 PM  .PHYSICAL THERAPY DISCHARGE SUMMARY  Visits from Start of Care:  3 Current functional level related to goals / functional outcomes: See goals above   Remaining deficits: No remainaing deficits   Education / Equipment: HEP, Dentist Plan: Patient agrees to discharge.  Patient goals were met. Patient is being discharged due to meeting the stated rehab goals.  And being pleased with current functional status?????

## 2014-11-16 ENCOUNTER — Ambulatory Visit (INDEPENDENT_AMBULATORY_CARE_PROVIDER_SITE_OTHER): Payer: No Typology Code available for payment source | Admitting: Sports Medicine

## 2014-11-16 ENCOUNTER — Encounter: Payer: Self-pay | Admitting: Sports Medicine

## 2014-11-16 VITALS — BP 120/80 | Ht 67.0 in | Wt 229.0 lb

## 2014-11-16 DIAGNOSIS — M5441 Lumbago with sciatica, right side: Secondary | ICD-10-CM

## 2014-11-16 NOTE — Progress Notes (Signed)
  DANETRA GLOCK - 58 y.o. female MRN [id number]  Date of birth: December 20, 1955  SUBJECTIVE:  Including CC & ROS.  Patient is a 58 year old African-American female presents today for follow-up on her chronic right hip pain and lumbar spine discomfort. Patient was seen 1 month ago who he discussed that majorities her symptoms are likely from mild degenerative changes in her back and in her hip with milder radicular symptoms from sciatica. Patient was sent to physical therapy and has completed for 5 visits with good success. Patient reports that with therapeutic exercise adjustments in her posture she is proximal monthly 70-80% improved in her symptoms. She remains active and is still currently on amitriptyline at bedtime which is likely also helping with her heart path ache type pain. Patient still has some hip pain but it's mostly mild and infrequent.  ROS: Review of systems otherwise negative except for information present in HPI  HISTORY: Past Medical, Surgical, Social, and Family History Reviewed & Updated per EMR. Pertinent Historical Findings include: Obesity Hypothyroidism Hyperlipidemia  DATA REVIEWED: Personally reviewed patient's 2 view right hip from 2014 that showed mild degenerative changes but no significant osteoarthritis or bone spurring.  Personally reviewed 3 view lumbar spine that was completed today which showed normal disc space height with no vertebral collapse, mild spondylosis, no significant facet stenosis or bone spurring present.  PHYSICAL EXAM:  VS: BP:120/80 mmHg  HR: bpm  TEMP: ( )  RESP:   HT:5\' 7"  (170.2 cm)   WT:229 lb (103.874 kg)  BMI:35.9 LOW BACK EXAM: General: well nourished Skin of LE: warm; dry, no rashes, lesions, ecchymosis or erythema. Vascular: radial pulses 2+ bilaterally Neurologically: Sensation to light touch lower extremities equal and intact bilaterally.  Normal lumbar curvature No step off defects in the lumbar spine No paraspinal muscle  tenderness or spasm Normal range of motion in flexion extension rotation and side bending of the back Normal rotation in the hip only mild pain with hip grind Negative FADIR Intact with normal motor function and sensation in bilateral lower extremity Pain with hip grinding on the right negative on the left Muscle strength: No pain generalized weakness with iliopsoas flexion, rectus femoral flexion, hamstring and gluteal extension, hip adduction or abduction.  No pain global weakness with gluteus medius and minimus hip abduction, abdominal muscle contraction forward or oblique positions. Strength for muscle testing in all plane was symmetric with global weakness.   ASSESSMENT & PLAN: See problem based charting & AVS for pt instructions.

## 2014-11-16 NOTE — Assessment & Plan Note (Signed)
Patient's a 58 year old female presented today for follow-up on her right hip and lumbar pain. X-rays of her back revealed some mild degenerative changes but no significant stenosis or loss of disc height. Patient responded well to clinical treatment with steroid Dosepak followed by 4-5 weeks of therapeutic exercise with PT. We discussed options today of considering a intra-articular hip injection at this point patient is doing clinically well and would likely not benefit greatly from injection at this time. Advised patient if pain worsens or returns that we could consider intra-articular hip injection or repeat physical therapy.

## 2014-12-20 ENCOUNTER — Ambulatory Visit: Payer: No Typology Code available for payment source

## 2014-12-26 ENCOUNTER — Other Ambulatory Visit: Payer: Self-pay | Admitting: *Deleted

## 2014-12-26 DIAGNOSIS — E785 Hyperlipidemia, unspecified: Secondary | ICD-10-CM

## 2015-01-02 MED ORDER — LEVOTHYROXINE SODIUM 100 MCG PO TABS: 100.0000 ug | ORAL_TABLET | Freq: Every day | ORAL | Status: DC

## 2015-01-02 MED ORDER — ROSUVASTATIN CALCIUM 5 MG PO TABS: 5.0000 mg | ORAL_TABLET | Freq: Every day | ORAL | Status: DC

## 2015-01-25 ENCOUNTER — Other Ambulatory Visit: Payer: Self-pay | Admitting: *Deleted

## 2015-01-25 MED ORDER — ESOMEPRAZOLE MAGNESIUM 20 MG PO CPDR: 20.0000 mg | DELAYED_RELEASE_CAPSULE | Freq: Every day | ORAL | Status: DC | PRN

## 2015-01-26 ENCOUNTER — Other Ambulatory Visit: Payer: Self-pay | Admitting: *Deleted

## 2015-01-26 MED ORDER — DEXLANSOPRAZOLE 30 MG PO CPDR: 30.0000 mg | DELAYED_RELEASE_CAPSULE | Freq: Every day | ORAL | Status: DC

## 2015-01-26 MED ORDER — OLOPATADINE HCL 0.1 % OP SOLN: 1.0000 [drp] | Freq: Two times a day (BID) | OPHTHALMIC | Status: DC | PRN

## 2015-01-26 MED ORDER — MOMETASONE FUROATE 50 MCG/ACT NA SUSP: 2.0000 | Freq: Every day | NASAL | Status: DC

## 2015-01-26 NOTE — Telephone Encounter (Signed)
[facility] can not get Nexium at no charge, can you change to Dexilant?  ALSO, pt asking for a steroid nasal spray.  Would you consider Nasonex?

## 2015-01-26 NOTE — Telephone Encounter (Signed)
Pls phone in 3 meds.

## 2015-01-29 NOTE — Telephone Encounter (Signed)
Faxed in

## 2015-02-21 ENCOUNTER — Other Ambulatory Visit: Payer: Self-pay | Admitting: *Deleted

## 2015-02-21 MED ORDER — OLOPATADINE HCL 0.1 % OP SOLN: 1.0000 [drp] | Freq: Two times a day (BID) | OPHTHALMIC | Status: DC | PRN

## 2015-02-22 NOTE — Telephone Encounter (Signed)
Rx called in 

## 2015-03-19 ENCOUNTER — Encounter: Payer: Self-pay | Admitting: Internal Medicine

## 2015-03-19 ENCOUNTER — Ambulatory Visit (INDEPENDENT_AMBULATORY_CARE_PROVIDER_SITE_OTHER): Payer: Self-pay | Admitting: Internal Medicine

## 2015-03-19 VITALS — BP 125/79 | HR 72 | Temp 97.9°F | Wt 225.6 lb

## 2015-03-19 DIAGNOSIS — K029 Dental caries, unspecified: Secondary | ICD-10-CM

## 2015-03-19 DIAGNOSIS — E039 Hypothyroidism, unspecified: Secondary | ICD-10-CM

## 2015-03-19 DIAGNOSIS — I10 Essential (primary) hypertension: Secondary | ICD-10-CM

## 2015-03-19 DIAGNOSIS — J302 Other seasonal allergic rhinitis: Secondary | ICD-10-CM

## 2015-03-19 LAB — CBC WITH DIFFERENTIAL/PLATELET
BASOS ABS: 0 10*3/uL (ref 0.0–0.1)
BASOS PCT: 0 % (ref 0–1)
EOS PCT: 4 % (ref 0–5)
Eosinophils Absolute: 0.3 10*3/uL (ref 0.0–0.7)
HCT: 43.1 % (ref 36.0–46.0)
Hemoglobin: 14 g/dL (ref 12.0–15.0)
LYMPHS ABS: 3.7 10*3/uL (ref 0.7–4.0)
Lymphocytes Relative: 54 % — ABNORMAL HIGH (ref 12–46)
MCH: 28.2 pg (ref 26.0–34.0)
MCHC: 32.5 g/dL (ref 30.0–36.0)
MCV: 86.7 fL (ref 78.0–100.0)
MPV: 9.9 fL (ref 8.6–12.4)
Monocytes Absolute: 0.3 10*3/uL (ref 0.1–1.0)
Monocytes Relative: 5 % (ref 3–12)
Neutro Abs: 2.6 10*3/uL (ref 1.7–7.7)
Neutrophils Relative %: 37 % — ABNORMAL LOW (ref 43–77)
Platelets: 382 10*3/uL (ref 150–400)
RBC: 4.97 MIL/uL (ref 3.87–5.11)
RDW: 16.2 % — AB (ref 11.5–15.5)
WBC: 6.9 10*3/uL (ref 4.0–10.5)

## 2015-03-19 LAB — COMPLETE METABOLIC PANEL WITH GFR
ALBUMIN: 4.3 g/dL (ref 3.5–5.2)
ALK PHOS: 70 U/L (ref 39–117)
ALT: 13 U/L (ref 0–35)
AST: 15 U/L (ref 0–37)
BUN: 13 mg/dL (ref 6–23)
CO2: 28 mEq/L (ref 19–32)
Calcium: 9.4 mg/dL (ref 8.4–10.5)
Chloride: 104 mEq/L (ref 96–112)
Creat: 0.84 mg/dL (ref 0.50–1.10)
GFR, Est African American: 89 mL/min
GFR, Est Non African American: 77 mL/min
Glucose, Bld: 88 mg/dL (ref 70–99)
Potassium: 3.8 mEq/L (ref 3.5–5.3)
SODIUM: 141 meq/L (ref 135–145)
TOTAL PROTEIN: 6.7 g/dL (ref 6.0–8.3)
Total Bilirubin: 0.3 mg/dL (ref 0.2–1.2)

## 2015-03-19 LAB — LIPID PANEL
CHOL/HDL RATIO: 3.8 ratio
CHOLESTEROL: 142 mg/dL (ref 0–200)
HDL: 37 mg/dL — AB (ref >=46)
LDL Cholesterol: 87 mg/dL (ref 0–99)
Triglycerides: 89 mg/dL (ref <150)
VLDL: 18 mg/dL (ref 0–40)

## 2015-03-19 LAB — TSH: TSH: 1.674 u[IU]/mL (ref 0.350–4.500)

## 2015-03-19 MED ORDER — HYDROCODONE-ACETAMINOPHEN 5-325 MG PO TABS: 1.0000 | ORAL_TABLET | Freq: Four times a day (QID) | ORAL | Status: DC | PRN

## 2015-03-19 NOTE — Progress Notes (Signed)
Patient ID: Emma Bonilla, female   DOB: 11-28-1956, 59 y.o.   MRN: [id number]     Subjective:   Patient ID: Emma Bonilla female    DOB: [birth year] 59 y.o.    MRN: [id number] Health Maintenance Due: There are no preventive care reminders to display for this patient.  _________________________________________________  HPI: Emma Bonilla is a 60 y.o. female here for a routine visit.  Pt has a PMH outlined below.  Please see problem-based charting assessment and plan note for further details of medical issues addressed at today's visit.  PMH: Past Medical History  Diagnosis Date  . Hyperlipidemia   . Hypertension   . Arthritis   . Thyroid disease   . Depression   . GERD (gastroesophageal reflux disease)   . TIA (transient ischemic attack)     2010  . Heart murmur     Medications: Current Outpatient Prescriptions on File Prior to Visit  Medication Sig Dispense Refill  . amitriptyline (ELAVIL) 25 MG tablet TAKE ONE TABLET BY MOUTH AT BEDTIME 90 tablet 3  . aspirin 81 MG tablet Take 1 tablet (81 mg total) by mouth daily. 30 tablet 3  . Biotin 5000 MCG CAPS Take by mouth.    . calcium-vitamin D (OSCAL WITH D) 500-200 MG-UNIT per tablet Take 1 tablet by mouth daily. 30 tablet 3  . desloratadine (CLARINEX) 5 MG tablet Take 1 tablet (5 mg total) by mouth daily. 90 tablet 3  . Dexlansoprazole 30 MG capsule Take 1 capsule (30 mg total) by mouth daily. 30 capsule 5  . levothyroxine (SYNTHROID) 100 MCG tablet Take 1 tablet (100 mcg total) by mouth daily. 90 tablet 1  . Melatonin 10 MG CAPS Take by mouth.    . mometasone (NASONEX) 50 MCG/ACT nasal spray Place 2 sprays into the nose daily. 17 g 5  . olopatadine (PATANOL) 0.1 % ophthalmic solution Place 1 drop into both eyes 2 (two) times daily as needed for allergies. 5 mL 0  . predniSONE (DELTASONE) 10 MG tablet Take as directed (Patient not taking: Reported on 11/09/2014) 48 tablet 0  . quinapril-hydrochlorothiazide (ACCURETIC)  20-25 MG per tablet Take 1 tablet by mouth daily. 90 tablet 3  . rosuvastatin (CRESTOR) 5 MG tablet Take 1 tablet (5 mg total) by mouth at bedtime. 90 tablet 1   No current facility-administered medications on file prior to visit.    Allergies: Allergies  Allergen Reactions  . Ibuprofen Rash  . Naproxen Sodium Rash    FH: Family History  Problem Relation Age of Onset  . Heart disease Mother   . Hypertension Mother   . Heart disease Father   . Hypertension Father   . Heart disease Sister   . Hypertension Sister   . Diabetes Sister   . Diabetes Brother     SH: History   Social History  . Marital Status: Married    Spouse Name: N/A  . Number of Children: N/A  . Years of Education: N/A   Social History Main Topics  . Smoking status: Current Every Day Smoker -- 0.50 packs/day for 45 years    Types: Cigarettes  . Smokeless tobacco: Not on file     Comment: Wellbutrin really helped - not taking any more  . Alcohol Use: Not on file     Comment: occasional  . Drug Use: Yes    Special: Marijuana     Comment: occasional, last 3 months ago  . Sexual Activity: Yes  Birth Control/ Protection: None   Other Topics Concern  . None   Social History Narrative    Review of Systems: Constitutional: Negative for fever, chills and weight loss.  Eyes: Negative for blurred vision.  Respiratory: Negative for cough and shortness of breath.  Cardiovascular: Negative for chest pain, palpitations and leg swelling.  Gastrointestinal: Negative for nausea, vomiting, abdominal pain, diarrhea, constipation and blood in stool.  Genitourinary: Negative for dysuria, urgency and frequency.  Musculoskeletal: Negative for myalgias and back pain.  Neurological: Negative for dizziness, weakness and headaches.     Objective:   Vital Signs: Filed Vitals:   03/19/15 1320  BP: 125/79  Pulse: 72  Temp: 97.9 F (36.6 C)  TempSrc: Oral  Weight: 102.331 kg (225 lb 9.6 oz)  SpO2: 99%       BP Readings from Last 3 Encounters:  03/19/15 125/79  11/16/14 120/80  10/19/14 147/84    Physical Exam: Constitutional: Vital signs reviewed.  Patient is in NAD and cooperative with exam.  Head: Normocephalic and atraumatic. Eyes: PERRL, EOMI, conjunctivae nl, no scleral icterus.  Throat: Poor dentition with several caries, no abscess observed  Neck: Supple. No lymphadenopathy.  Cardiovascular: RRR, no MRG. Pulmonary/Chest: normal effort, CTAB, no wheezes, rales, or rhonchi. Abdominal: Soft. NT/ND +BS. Neurological: A&O x3, cranial nerves II-XII are grossly intact, moving all extremities. Extremities: 2+DP b/l; no pitting edema. Skin: Warm, dry and intact. No rash.   Assessment & Plan:   Assessment and plan was discussed and formulated with my attending.

## 2015-03-19 NOTE — Assessment & Plan Note (Addendum)
Pt c/o left sided dental pain.  Exam reveals multiple caries.  Did not observe an abscess.  States pain has been going on for months that comes and goes.  Denies fever/chills, lymphadenopathy.  Has tired tylenol and ibuprofen without relief.  Emma Bonilla called dental clinic and has appt for next Tues -vicodin 5-325mg  q6h for severe pain #20 for dental pain until she can see a dentist next week  -advised warm salt water gargles

## 2015-03-19 NOTE — Assessment & Plan Note (Signed)
She is having a lot of trouble with allergies now.  Reminded her to be cautious with taking clarinex daily unless she needs to as this may increase problems with her memory long term.  -cont clarinex as needed -cont nasal spray

## 2015-03-19 NOTE — Assessment & Plan Note (Signed)
BP Readings from Last 3 Encounters:  03/19/15 125/79  11/16/14 120/80  10/19/14 147/84    Lab Results  Component Value Date   NA 141 04/10/2014   K 4.0 04/10/2014   CREATININE 0.95 04/10/2014    Assessment: Blood pressure control: controlled Progress toward BP goal:  at goal  Plan: Medications:  continue current medications of accuretic 20-25mg , ASA 81mg , and crestor 5mg  qd Other plans: check CMP, cbc, lipid panel

## 2015-03-19 NOTE — Assessment & Plan Note (Addendum)
Reports having hot flashes occasionally.   -check TSH

## 2015-03-19 NOTE — Patient Instructions (Signed)
Thank you for your visit today.   Please return to the internal medicine clinic in 3-4 month(s) or sooner if needed.     Your current medical regimen is effective;  continue present plan and take all medications as prescribed.    Please keep your dental appointment next Tuesday for your dental caries.    Please be sure to bring all of your medications with you to every visit; this includes herbal supplements, vitamins, eye drops, and any over-the-counter medications.   Should you have any questions regarding your medications and/or any new or worsening symptoms, please be sure to call the clinic at [phone].   If you believe that you are suffering from a life threatening condition or one that may result in the loss of limb or function, then you should call 911 or proceed to the nearest Emergency Department.   Menopause and Herbal Products Menopause is the normal time of life when menstrual periods stop completely. Menopause is complete when you have missed 12 consecutive menstrual periods. It usually occurs between the ages of 71 to 43, with an average age of 58. Very rarely does a woman develop menopause before 59 years old. At menopause, your ovaries stop producing the female hormones, estrogen and progesterone. This can cause undesirable symptoms and also affect your health. Sometimes the symptoms can occur 4 to 5 years before the menopause begins. There is no relationship between menopause and:  Oral contraceptives.  Number of children you had.  Race.  The age your menstrual periods started (menarche). Heavy smokers and very thin women may develop menopause earlier in life. Estrogen and progesterone hormone treatment is the usual method of treating menopausal symptoms. However, there are women who should not take hormone treatment. This is true of:   Women that have breast or uterine cancer.  Women who prefer not to take hormones because of certain side effects (abnormal  uterine bleeding).  Women who are afraid that hormones may cause breast cancer.  Women who have a history of liver disease, heart disease, stroke, or blood clots. For these women, there are other medications that may help treat their menopausal symptoms. These medications are found in plants and botanical products. They can be found in the form of herbs, teas, oils, tinctures, and pills.  CAUSES:  The ovaries stop producing the female hormones estrogen and progesterone.  Other causes include:  Surgery to remove both ovaries.  The ovaries stop functioning for no know reason.  Tumors of the pituitary gland in the brain.  Medical disease that affects the ovaries and hormone production.  Radiation treatment to the abdomen or pelvis.  Chemotherapy that affects the ovaries. PHYTOESTROGENS: Phytoestrogens occur naturally in plants and plant products. They act like estrogen in the body. Herbal medications are made from these plants and botanical steroids. There are 3 types of phytoestrogens:  Isoflavones (genistein and daidzein) are found in soy, garbanzo beans, miso and tofu foods.  Ligins are found in the shell of seeds. They are used to make oils like flaxseed oil. The bacteria in your intestine act on these foods to produce the estrogen-like hormones.  Coumestans are estrogen-like. Some of the foods they are found in include sunflower seeds and bean sprouts. CONDITIONS AND THEIR POSSIBLE HERBAL TREATMENT:  Hot flashes and night sweats.  Soy, black cohosh and evening primrose.  Irritability, insomnia, depression and memory problems.  Chasteberry, ginseng, and soy.  St. John's wort may be helpful for depression. However, there is a concern of  it causing cataracts of the eye and may have bad effects on other medications. St. John's wort should not be taken for long time and without your caregiver's advice.  Loss of libido and vaginal and skin dryness.  Wild yam and  soy.  Prevention of coronary heart disease and osteoporosis.  Soy and Isoflavones. Several studies have shown that some women benefit from herbal medications, but most of the studies have not consistently shown that these supplements are much better than placebo. Other forms of treatment to help women with menopausal symptoms include a balanced diet, rest, exercise, vitamin and calcium (with vitamin D) supplements, acupuncture, and group therapy when necessary. THOSE WHO SHOULD NOT TAKE HERBAL MEDICATIONS INCLUDE:  Women who are planning on getting pregnant unless told by your caregiver.  Women who are breastfeeding unless told by your caregiver.  Women who are taking other prescription medications unless told by your caregiver.  Infants, children, and elderly women unless told by your caregiver. Different herbal medications have different and unmeasured amounts of the herbal ingredients. There are no regulations, quality control, and standardization of the ingredients in herbal medications. Therefore, the amount of the ingredient in the medication may vary from one herb, pill, tea, oil or tincture to another. Many herbal medications can cause serious problems and can even have poisonous effects if taken too much or too long. If problems develop, the medication should be stopped and recorded by your caregiver. HOME CARE INSTRUCTIONS  Do not take or give children herbal medications without your caregiver's advice.  Let your caregiver know all the medications you are taking. This includes prescription, over-the-counter, eye drops, and creams.  Do not take herbal medications longer or more than recommended.  Tell your caregiver about any side effects from the medication. SEEK MEDICAL CARE IF:  You develop a fever of 102 F (38.9 C), or as directed by your caregiver.  You feel sick to your stomach (nauseous), vomit, or have diarrhea.  You develop a rash.  You develop abdominal  pain.  You develop severe headaches.  You start to have vision problems.  You feel dizzy or faint.  You start to feel numbness in any part of your body.  You start shaking (have convulsions). Document Released: 05/05/2008 Document Revised: 11/03/2012 Document Reviewed: 12/03/2010 [facility] Patient Information 2015 [place], [place]. This information is not intended to replace advice given to you by your health care provider. Make sure you discuss any questions you have with your health care provider.

## 2015-03-21 ENCOUNTER — Other Ambulatory Visit: Payer: Self-pay | Admitting: *Deleted

## 2015-03-21 MED ORDER — OLOPATADINE HCL 0.1 % OP SOLN: 1.0000 [drp] | Freq: Two times a day (BID) | OPHTHALMIC | Status: DC | PRN

## 2015-03-21 NOTE — Telephone Encounter (Signed)
rx faxed in 

## 2015-03-21 NOTE — Telephone Encounter (Signed)
Last refill 3-29

## 2015-03-23 NOTE — Progress Notes (Signed)
Internal Medicine Clinic Attending  Case discussed with Dr. Gill soon after the resident saw the patient.  We reviewed the resident's history and exam and pertinent patient test results.  I agree with the assessment, diagnosis, and plan of care documented in the resident's note.  

## 2015-05-28 ENCOUNTER — Other Ambulatory Visit: Payer: Self-pay | Admitting: Internal Medicine

## 2015-05-28 MED ORDER — OLMESARTAN MEDOXOMIL-HCTZ 20-12.5 MG PO TABS: 1.0000 | ORAL_TABLET | Freq: Every day | ORAL | Status: DC

## 2015-05-28 NOTE — Progress Notes (Signed)
Changed accuretic to benicar-HCTZ per patient request because the county [facility] cannot obtain accuretic.

## 2015-06-08 ENCOUNTER — Telehealth: Payer: Self-pay | Admitting: Internal Medicine

## 2015-06-08 NOTE — Telephone Encounter (Signed)
Call to patient to confirm appointment for 06/11/15 at 1:15 lmtcb

## 2015-06-11 ENCOUNTER — Encounter: Payer: Self-pay | Admitting: Internal Medicine

## 2015-06-11 ENCOUNTER — Ambulatory Visit (HOSPITAL_COMMUNITY)
Admission: RE | Admit: 2015-06-11 | Discharge: 2015-06-11 | Disposition: A | Discharge status: Home / Self Care | Payer: No Typology Code available for payment source | Source: Ambulatory Visit | Attending: Internal Medicine | Admitting: Internal Medicine

## 2015-06-11 ENCOUNTER — Other Ambulatory Visit: Payer: Self-pay | Admitting: Internal Medicine

## 2015-06-11 ENCOUNTER — Ambulatory Visit (INDEPENDENT_AMBULATORY_CARE_PROVIDER_SITE_OTHER): Payer: Self-pay | Admitting: Internal Medicine

## 2015-06-11 VITALS — BP 125/72 | HR 98 | Temp 98.0°F | Ht 67.0 in | Wt 222.7 lb

## 2015-06-11 DIAGNOSIS — Z720 Tobacco use: Secondary | ICD-10-CM

## 2015-06-11 DIAGNOSIS — K029 Dental caries, unspecified: Secondary | ICD-10-CM

## 2015-06-11 DIAGNOSIS — R002 Palpitations: Secondary | ICD-10-CM

## 2015-06-11 DIAGNOSIS — Z1231 Encounter for screening mammogram for malignant neoplasm of breast: Secondary | ICD-10-CM

## 2015-06-11 DIAGNOSIS — I350 Nonrheumatic aortic (valve) stenosis: Secondary | ICD-10-CM | POA: Insufficient documentation

## 2015-06-11 DIAGNOSIS — M7989 Other specified soft tissue disorders: Secondary | ICD-10-CM | POA: Insufficient documentation

## 2015-06-11 DIAGNOSIS — M1611 Unilateral primary osteoarthritis, right hip: Secondary | ICD-10-CM

## 2015-06-11 DIAGNOSIS — M25562 Pain in left knee: Secondary | ICD-10-CM | POA: Insufficient documentation

## 2015-06-11 DIAGNOSIS — E785 Hyperlipidemia, unspecified: Secondary | ICD-10-CM

## 2015-06-11 DIAGNOSIS — M1711 Unilateral primary osteoarthritis, right knee: Secondary | ICD-10-CM

## 2015-06-11 DIAGNOSIS — Z0000 Encounter for general adult medical examination without abnormal findings: Secondary | ICD-10-CM

## 2015-06-11 DIAGNOSIS — I10 Essential (primary) hypertension: Secondary | ICD-10-CM

## 2015-06-11 DIAGNOSIS — J302 Other seasonal allergic rhinitis: Secondary | ICD-10-CM

## 2015-06-11 DIAGNOSIS — Z8249 Family history of ischemic heart disease and other diseases of the circulatory system: Secondary | ICD-10-CM

## 2015-06-11 DIAGNOSIS — F17210 Nicotine dependence, cigarettes, uncomplicated: Secondary | ICD-10-CM

## 2015-06-11 NOTE — Progress Notes (Signed)
Subjective:   Patient ID: Emma Bonilla female    DOB: 1956-07-20 59 y.o.    MRN: [id number] Health Maintenance Due: Health Maintenance Due  Topic Date Due  . PAP SMEAR  03/31/2015   _________________________________________________  HPI: [name] is a 59 y.o. female here for a routine visit.  Pt has a PMH outlined below.  Please see problem-based charting assessment and plan note for further details of medical issues addressed at today's visit.  PMH: Past Medical History  Diagnosis Date  . Hyperlipidemia   . Hypertension   . Arthritis   . Thyroid disease   . Depression   . GERD (gastroesophageal reflux disease)   . TIA (transient ischemic attack)     2010  . Heart murmur     Medications: Current Outpatient Prescriptions on File Prior to Visit  Medication Sig Dispense Refill  . amitriptyline (ELAVIL) 25 MG tablet TAKE ONE TABLET BY MOUTH AT BEDTIME 90 tablet 3  . aspirin 81 MG tablet Take 1 tablet (81 mg total) by mouth daily. 30 tablet 3  . Biotin 5000 MCG CAPS Take by mouth.    . calcium-vitamin D (OSCAL WITH D) 500-200 MG-UNIT per tablet Take 1 tablet by mouth daily. 30 tablet 3  . desloratadine (CLARINEX) 5 MG tablet Take 1 tablet (5 mg total) by mouth daily. 90 tablet 3  . Dexlansoprazole 30 MG capsule Take 1 capsule (30 mg total) by mouth daily. 30 capsule 5  . Melatonin 10 MG CAPS Take by mouth.    . mometasone (NASONEX) 50 MCG/ACT nasal spray Place 2 sprays into the nose daily. 17 g 5  . olmesartan-hydrochlorothiazide (BENICAR HCT) 20-12.5 MG per tablet Take 1 tablet by mouth daily. 30 tablet 1  . olopatadine (PATANOL) 0.1 % ophthalmic solution Place 1 drop into both eyes 2 (two) times daily as needed for allergies. 5 mL 0  . rosuvastatin (CRESTOR) 5 MG tablet Take 1 tablet (5 mg total) by mouth at bedtime. 90 tablet 1   No current facility-administered medications on file prior to visit.    Allergies: Allergies  Allergen Reactions  .  Ibuprofen Rash  . Naproxen Sodium Rash    FH: Family History  Problem Relation Age of Onset  . Heart disease Mother   . Hypertension Mother   . Heart disease Father   . Hypertension Father   . Heart disease Sister   . Hypertension Sister   . Diabetes Sister   . Diabetes Brother     SH: History   Social History  . Marital Status: Married    Spouse Name: N/A  . Number of Children: N/A  . Years of Education: N/A   Social History Main Topics  . Smoking status: Current Every Day Smoker -- 0.50 packs/day for 45 years    Types: Cigarettes  . Smokeless tobacco: Not on file     Comment: Wellbutrin really helped - not taking any more  . Alcohol Use: Not on file     Comment: occasional  . Drug Use: Yes    Special: Marijuana     Comment: occasional, last 3 months ago  . Sexual Activity: Yes    Birth Control/ Protection: None   Other Topics Concern  . Not on file   Social History Narrative    Review of Systems: Constitutional: Negative for fever, chills and weight loss.  Eyes: Negative for blurred vision.  Respiratory: Negative for cough and shortness of breath.  Cardiovascular: +  chest pain, palpitations and leg swelling.  Gastrointestinal: Negative for nausea, vomiting, abdominal pain, diarrhea, constipation and blood in stool.  Genitourinary: Negative for dysuria, urgency and frequency.  Musculoskeletal: Negative for myalgias and back pain.  Neurological: Negative for dizziness, weakness and headaches.     Objective:   Vital Signs: Filed Vitals:   06/11/15 1335  BP: 125/72  Pulse: 98  Temp: 98 F (36.7 C)  TempSrc: Oral  Height: 5\' 7"  (1.702 m)  Weight: 222 lb 11.2 oz (101.016 kg)  SpO2: 100%      BP Readings from Last 3 Encounters:  06/11/15 125/72  03/19/15 125/79  11/16/14 120/80    Physical Exam: Constitutional: Vital signs reviewed.  Patient is in NAD and cooperative with exam.  Head: Normocephalic and atraumatic. Eyes: EOMI, conjunctivae nl,  no scleral icterus.  Neck: Supple. Cardiovascular: RRR, no MRG. Pulmonary/Chest: normal effort, CTAB, no wheezes, rales, or rhonchi. Abdominal: Soft. NT/ND +BS. Neurological: A&O x3, cranial nerves II-XII are grossly intact, moving all extremities. MSK: R knee with mild swelling, slight warmth, mild crepitus.  No redness.   Extremities: 2+DP b/l; trace pitting edema. Skin: Warm, dry and intact. No rash.   Assessment & Plan:   Assessment and plan was discussed and formulated with my attending.

## 2015-06-11 NOTE — Assessment & Plan Note (Signed)
She has not gone to the dentist yet due to a misunderstanding of the appointment time and she has since lost the number.  Denies any dental pain today. -will get her the number for the dental office

## 2015-06-11 NOTE — Assessment & Plan Note (Signed)
-  cont. crestor 

## 2015-06-11 NOTE — Assessment & Plan Note (Signed)
-  refilled patanol

## 2015-06-11 NOTE — Patient Instructions (Addendum)
Thank you for your visit today.   Please return to the internal medicine clinic in 3-4 month(s) or sooner if needed.     Your current medical regimen is effective;  continue present plan and take all medications as prescribed.    Please make sure you continue to take your baby aspirin daily.  Call 911 if you experience severe chest pain that does not go away.    I have made the following referrals for you: cardiology for your palpitations and chest tightness.   You need the following test(s) for regular health maintenance: pap smear.   Please be sure to bring all of your medications with you to every visit; this includes herbal supplements, vitamins, eye drops, and any over-the-counter medications.   Should you have any questions regarding your medications and/or any new or worsening symptoms, please be sure to call the clinic at [phone].   If you believe that you are suffering from a life threatening condition or one that may result in the loss of limb or function, then you should call 911 or proceed to the nearest Emergency Department.     A healthy lifestyle and preventative care can promote health and wellness.   Maintain regular health, dental, and eye exams.  Eat a healthy diet. Foods like vegetables, fruits, whole grains, low-fat dairy products, and lean protein foods contain the nutrients you need without too many calories. Decrease your intake of foods high in solid fats, added sugars, and salt. Get information about a proper diet from your caregiver, if necessary.  Regular physical exercise is one of the most important things you can do for your health. Most adults should get at least 150 minutes of moderate-intensity exercise (any activity that increases your heart rate and causes you to sweat) each week. In addition, most adults need muscle-strengthening exercises on 2 or more days a week.   Maintain a healthy weight. The body mass index (BMI) is a screening tool to  identify possible weight problems. It provides an estimate of body fat based on height and weight. Your caregiver can help determine your BMI, and can help you achieve or maintain a healthy weight. For adults 20 years and older:  A BMI below 18.5 is considered underweight.  A BMI of 18.5 to 24.9 is normal.  A BMI of 25 to 29.9 is considered overweight.  A BMI of 30 and above is considered obese.

## 2015-06-11 NOTE — Assessment & Plan Note (Signed)
Pt is due for a pap smear.   -will schedule for next OV

## 2015-06-11 NOTE — Assessment & Plan Note (Signed)
BP Readings from Last 3 Encounters:  06/11/15 125/72  03/19/15 125/79  11/16/14 120/80    Lab Results  Component Value Date   NA 141 03/19/2015   K 3.8 03/19/2015   CREATININE 0.84 03/19/2015    Assessment: Blood pressure control: controlled Progress toward BP goal:  at goal  Plan: Medications:  continue current medications Other plans: advised smoking cessation and continued weight loss

## 2015-06-14 ENCOUNTER — Ambulatory Visit (HOSPITAL_COMMUNITY)
Admission: RE | Admit: 2015-06-14 | Discharge: 2015-06-14 | Disposition: A | Discharge status: Home / Self Care | Payer: No Typology Code available for payment source | Source: Ambulatory Visit | Attending: Internal Medicine | Admitting: Internal Medicine

## 2015-06-14 ENCOUNTER — Other Ambulatory Visit: Payer: Self-pay | Admitting: *Deleted

## 2015-06-14 DIAGNOSIS — Z1231 Encounter for screening mammogram for malignant neoplasm of breast: Secondary | ICD-10-CM | POA: Insufficient documentation

## 2015-06-14 MED ORDER — LEVOTHYROXINE SODIUM 100 MCG PO TABS: 100.0000 ug | ORAL_TABLET | Freq: Every day | ORAL | Status: DC

## 2015-06-15 ENCOUNTER — Telehealth: Payer: Self-pay | Admitting: Internal Medicine

## 2015-06-15 ENCOUNTER — Encounter: Payer: Self-pay | Admitting: Internal Medicine

## 2015-06-17 ENCOUNTER — Encounter: Payer: Self-pay | Admitting: Internal Medicine

## 2015-06-17 NOTE — Assessment & Plan Note (Signed)
Pt c/o R knee pain after walking over the July 4th weekend.  States the pain has improved somewhat but is still bothering her.  No fever/chills, trauma.  Has experienced knee pain in the past thought to be due to OA.  No prior XRs. -obtain XR of R knee  -refer to SM/PT if cont to bother her  -tylenol PRN

## 2015-06-17 NOTE — Assessment & Plan Note (Addendum)
Pt reports some palpitations and chest discomfort that has been going on for a while.  She denies any other symptoms such as diaphoresis, N/V, lightheadedness.  States the pain may come on with or without exertion.  She had the symptoms previously in [place] and had a stress test that she does not know the results of.  Has a strong FH of CAD stating her mother had an MI in her 76s.  She denies active CP today.   -will refer to cardiology  -advised to go to ED if cont having severe CP/palpitations  -advised smoking cessation  -cont to control HTN, HLD

## 2015-06-17 NOTE — Assessment & Plan Note (Addendum)
-  discussed smoking cessation, states she can obtain free patches from the quit line and she is going to pursue this

## 2015-06-18 NOTE — Telephone Encounter (Signed)
Close encounter 

## 2015-06-19 ENCOUNTER — Ambulatory Visit: Payer: No Typology Code available for payment source

## 2015-06-19 NOTE — Progress Notes (Signed)
Case discussed with Dr. Gill at the time of the visit.  We reviewed the resident's history and exam and pertinent patient test results.  I agree with the assessment, diagnosis, and plan of care documented in the resident's note. 

## 2015-07-02 ENCOUNTER — Encounter: Payer: Self-pay | Admitting: Internal Medicine

## 2015-07-03 ENCOUNTER — Other Ambulatory Visit: Payer: Self-pay | Admitting: *Deleted

## 2015-07-04 MED ORDER — OLMESARTAN MEDOXOMIL-HCTZ 20-12.5 MG PO TABS: 1.0000 | ORAL_TABLET | Freq: Every day | ORAL | Status: DC

## 2015-07-06 ENCOUNTER — Other Ambulatory Visit: Payer: Self-pay | Admitting: *Deleted

## 2015-07-06 DIAGNOSIS — E785 Hyperlipidemia, unspecified: Secondary | ICD-10-CM

## 2015-07-08 MED ORDER — OLOPATADINE HCL 0.1 % OP SOLN
1.0000 [drp] | Freq: Two times a day (BID) | OPHTHALMIC | Status: DC | PRN
Start: 2015-07-08 — End: 2016-03-17

## 2015-07-08 MED ORDER — ROSUVASTATIN CALCIUM 5 MG PO TABS: 5.0000 mg | ORAL_TABLET | Freq: Every day | ORAL | Status: DC

## 2015-07-16 ENCOUNTER — Ambulatory Visit (INDEPENDENT_AMBULATORY_CARE_PROVIDER_SITE_OTHER): Payer: Self-pay | Admitting: Internal Medicine

## 2015-07-16 ENCOUNTER — Encounter: Payer: Self-pay | Admitting: Internal Medicine

## 2015-07-16 VITALS — BP 129/80 | HR 69 | Temp 98.0°F | Ht 67.0 in | Wt 225.8 lb

## 2015-07-16 DIAGNOSIS — Z0000 Encounter for general adult medical examination without abnormal findings: Secondary | ICD-10-CM

## 2015-07-16 DIAGNOSIS — R002 Palpitations: Secondary | ICD-10-CM

## 2015-07-16 DIAGNOSIS — I10 Essential (primary) hypertension: Secondary | ICD-10-CM

## 2015-07-16 NOTE — Patient Instructions (Signed)
Thank you for your visit today.   Please return to the internal medicine clinic in 4-5 month(s) or sooner if needed.     I have made the following additions/changes to your medications: None.  Please be sure to bring all of your medications with you to every visit; this includes herbal supplements, vitamins, eye drops, and any over-the-counter medications.   Should you have any questions regarding your medications and/or any new or worsening symptoms, please be sure to call the clinic at [phone].   If you believe that you are suffering from a life threatening condition or one that may result in the loss of limb or function, then you should call 911 and proceed to the nearest Emergency Department.   A healthy lifestyle and preventative care can promote health and wellness.   Maintain regular health, dental, and eye exams.  Eat a healthy diet. Foods like vegetables, fruits, whole grains, low-fat dairy products, and lean protein foods contain the nutrients you need without too many calories. Decrease your intake of foods high in solid fats, added sugars, and salt. Get information about a proper diet from your caregiver, if necessary.  Regular physical exercise is one of the most important things you can do for your health. Most adults should get at least 150 minutes of moderate-intensity exercise (any activity that increases your heart rate and causes you to sweat) each week. In addition, most adults need muscle-strengthening exercises on 2 or more days a week.   Maintain a healthy weight. The body mass index (BMI) is a screening tool to identify possible weight problems. It provides an estimate of body fat based on height and weight. Your caregiver can help determine your BMI, and can help you achieve or maintain a healthy weight. For adults 20 years and older:  A BMI below 18.5 is considered underweight.  A BMI of 18.5 to 24.9 is normal.  A BMI of 25 to 29.9 is considered  overweight.  A BMI of 30 and above is considered obese.

## 2015-07-16 NOTE — Assessment & Plan Note (Addendum)
Pap smear today.  No complaints.

## 2015-07-16 NOTE — Assessment & Plan Note (Signed)
Still endorses palpitations.   -has appt with cardiology in September

## 2015-07-16 NOTE — Progress Notes (Signed)
Patient ID: MICHALLA RINGER, female   DOB: 03/27/1956, 58 y.o.   MRN: [id number]     Subjective:   Patient ID: TAVA PEERY female    DOB: 04/18/1956 59 y.o.    MRN: [id number] Health Maintenance Due: Health Maintenance Due  Topic Date Due  . PAP SMEAR  03/31/2015  . INFLUENZA VACCINE  07/02/2015    _________________________________________________  HPI: Ms.Emmelyn E Laughridge is a 59 y.o. female here for a routine visit for a pap smear.  Pt has a PMH outlined below.  Please see problem-based charting assessment and plan for further details of medical issues addressed at today's visit.  PMH: Past Medical History  Diagnosis Date  . Hyperlipidemia   . Hypertension   . Arthritis   . Thyroid disease   . Depression   . GERD (gastroesophageal reflux disease)   . TIA (transient ischemic attack)     2010  . Heart murmur     Medications: Current Outpatient Prescriptions on File Prior to Visit  Medication Sig Dispense Refill  . amitriptyline (ELAVIL) 25 MG tablet TAKE ONE TABLET BY MOUTH AT BEDTIME 90 tablet 3  . aspirin 81 MG tablet Take 1 tablet (81 mg total) by mouth daily. 30 tablet 3  . Biotin 5000 MCG CAPS Take by mouth.    . calcium-vitamin D (OSCAL WITH D) 500-200 MG-UNIT per tablet Take 1 tablet by mouth daily. 30 tablet 3  . desloratadine (CLARINEX) 5 MG tablet Take 1 tablet (5 mg total) by mouth daily. 90 tablet 3  . Dexlansoprazole 30 MG capsule Take 1 capsule (30 mg total) by mouth daily. 30 capsule 5  . levothyroxine (SYNTHROID) 100 MCG tablet Take 1 tablet (100 mcg total) by mouth daily. 90 tablet 1  . Melatonin 10 MG CAPS Take by mouth.    . mometasone (NASONEX) 50 MCG/ACT nasal spray Place 2 sprays into the nose daily. 17 g 5  . olmesartan-hydrochlorothiazide (BENICAR HCT) 20-12.5 MG per tablet Take 1 tablet by mouth daily. 90 tablet 3  . olopatadine (PATANOL) 0.1 % ophthalmic solution Place 1 drop into both eyes 2 (two) times daily as needed for allergies. 5 mL 1  .  rosuvastatin (CRESTOR) 5 MG tablet Take 1 tablet (5 mg total) by mouth at bedtime. 90 tablet 3   No current facility-administered medications on file prior to visit.    Allergies: Allergies  Allergen Reactions  . Ibuprofen Rash  . Naproxen Sodium Rash    FH: Family History  Problem Relation Age of Onset  . Heart disease Mother   . Hypertension Mother   . Heart disease Father   . Hypertension Father   . Heart disease Sister   . Hypertension Sister   . Diabetes Sister   . Diabetes Brother     SH: Social History   Social History  . Marital Status: Married    Spouse Name: N/A  . Number of Children: N/A  . Years of Education: N/A   Social History Main Topics  . Smoking status: Current Every Day Smoker -- 0.50 packs/day for 45 years    Types: Cigarettes  . Smokeless tobacco: None     Comment: Wellbutrin really helped - not taking any more  . Alcohol Use: None     Comment: occasional  . Drug Use: Yes    Special: Marijuana     Comment: occasional, last 3 months ago  . Sexual Activity: Yes    Birth Control/ Protection: None   Other  Topics Concern  . None   Social History Narrative    Review of Systems: Constitutional: Negative for fever, chills and weight loss.  Eyes: Negative for blurred vision.  Respiratory: Negative for cough and shortness of breath.  Cardiovascular: +Palpitations Breast: Negative for lumps, erythema, or nipple drainage.  Genitourinary: Negative for dysuria, urgency, frequency, or vaginal discharge or itching.       Objective:   Vital Signs: Filed Vitals:   07/16/15 1326  BP: 129/80  Pulse: 69  Temp: 98 F (36.7 C)  TempSrc: Oral  Height: 5\' 7"  (1.702 m)  Weight: 225 lb 12.8 oz (102.422 kg)  SpO2: 100%     BP Readings from Last 3 Encounters:  07/16/15 129/80  06/11/15 125/72  03/19/15 125/79    Physical Exam: Constitutional: Vital signs reviewed.  Patient is in NAD and cooperative with exam.  Head: Normocephalic and  atraumatic. Eyes: EOMI, no scleral icterus.  Breast: Deferred.  Cardiovascular: RRR, no MRG. Pulmonary/Chest: normal effort, CTAB, no wheezes, rales, or rhonchi. Genital: External genitalia normal.  Vaginal mucosa without lesions, no discharge.   Neurological: A&O x3, cranial nerves II-XII are grossly intact, moving all extremities. Skin: Warm, dry and intact. No rash.   Assessment & Plan:   Assessment and plan was discussed and formulated with my attending.

## 2015-07-16 NOTE — Assessment & Plan Note (Signed)
-  cont current meds 

## 2015-07-17 LAB — CYTOLOGY - PAP

## 2015-07-19 NOTE — Progress Notes (Signed)
Internal Medicine Clinic Attending  Case discussed with Dr. Gill soon after the resident saw the patient.  We reviewed the resident's history and exam and pertinent patient test results.  I agree with the assessment, diagnosis, and plan of care documented in the resident's note.  

## 2015-07-30 ENCOUNTER — Ambulatory Visit: Payer: Self-pay | Admitting: Internal Medicine

## 2015-08-20 ENCOUNTER — Other Ambulatory Visit: Payer: Self-pay | Admitting: *Deleted

## 2015-08-20 MED ORDER — MOMETASONE FUROATE 50 MCG/ACT NA SUSP: 2.0000 | Freq: Every day | NASAL | Status: DC | PRN

## 2015-08-21 ENCOUNTER — Ambulatory Visit (INDEPENDENT_AMBULATORY_CARE_PROVIDER_SITE_OTHER): Payer: No Typology Code available for payment source | Admitting: Internal Medicine

## 2015-08-21 ENCOUNTER — Encounter: Payer: Self-pay | Admitting: Internal Medicine

## 2015-08-21 VITALS — BP 132/88 | HR 74 | Ht 67.0 in | Wt 223.5 lb

## 2015-08-21 DIAGNOSIS — R002 Palpitations: Secondary | ICD-10-CM

## 2015-08-21 DIAGNOSIS — Z720 Tobacco use: Secondary | ICD-10-CM

## 2015-08-21 DIAGNOSIS — G459 Transient cerebral ischemic attack, unspecified: Secondary | ICD-10-CM | POA: Insufficient documentation

## 2015-08-21 DIAGNOSIS — G458 Other transient cerebral ischemic attacks and related syndromes: Secondary | ICD-10-CM

## 2015-08-21 DIAGNOSIS — R011 Cardiac murmur, unspecified: Secondary | ICD-10-CM

## 2015-08-21 NOTE — Patient Instructions (Signed)
Your physician has requested that you have an echocardiogram @ [address]. Echocardiography is a painless test that uses sound waves to create images of your heart. It provides your doctor with information about the size and shape of your heart and how well your heart's chambers and valves are working. This procedure takes approximately one hour. There are no restrictions for this procedure.  Dr. Debara Pickett has ordered a heart monitor to wear for 30 days.  Please access [email or web address] >> select financial assistance >> complete application   Your physician recommends that you schedule a follow-up appointment after your tests

## 2015-08-21 NOTE — Progress Notes (Signed)
OFFICE NOTE  Chief Complaint:  Palpitations  Primary Care Physician: Michail Jewels, MD  HPI:  Emma Bonilla is a pleasant 59 year old female is currently referred to me from the internal medicine clinic for evaluation of palpitations. Emma Bonilla has a strong family history of heart disease with both her mother who had congestive heart failure in her father who had died of an MI. She has a personal history of hypertension, dyslipidemia, about a 40-50 pack year smoking history and a known murmur. She presents for evaluation of palpitations. She reports this is been going on for about 2 months and mostly takes place at rest. She notices at night. There are episodes were heart races for several seconds and then goes back to normal. She said she's had 2 or 3 of these episodes in the past month. She does report a prior history of TIA in 2010 and started taking aspirin after that. She has good control of her cholesterol on Crestor. She denies any chest pain or worsening shortness of breath. She's never had presyncope or syncopal symptoms.  PMHx:  Past Medical History  Diagnosis Date  . Hyperlipidemia   . Hypertension   . Arthritis   . Thyroid disease   . Depression   . GERD (gastroesophageal reflux disease)   . TIA (transient ischemic attack)     2010  . Heart murmur     Past Surgical History  Procedure Laterality Date  . Dilation and curettage of uterus      30 years ago    FAMHx:  Family History  Problem Relation Age of Onset  . Heart disease Mother   . Hypertension Mother   . Heart disease Father   . Hypertension Father   . Heart disease Sister   . Hypertension Sister   . Diabetes Sister   . Diabetes Brother     SOCHx:   reports that she has been smoking Cigarettes.  She has a 22.5 pack-year smoking history. She does not have any smokeless tobacco history on file. She reports that she uses illicit drugs (Marijuana). Her alcohol history is not on file.  ALLERGIES:    Allergies  Allergen Reactions  . Ibuprofen Rash  . Naproxen Sodium Rash    ROS: A comprehensive review of systems was negative except for: Cardiovascular: positive for palpitations  HOME MEDS: Current Outpatient Prescriptions  Medication Sig Dispense Refill  . amitriptyline (ELAVIL) 25 MG tablet TAKE ONE TABLET BY MOUTH AT BEDTIME 90 tablet 3  . aspirin 81 MG tablet Take 1 tablet (81 mg total) by mouth daily. 30 tablet 3  . Biotin 5000 MCG CAPS Take by mouth.    . calcium-vitamin D (OSCAL WITH D) 500-200 MG-UNIT per tablet Take 1 tablet by mouth daily. 30 tablet 3  . desloratadine (CLARINEX) 5 MG tablet Take 1 tablet (5 mg total) by mouth daily. 90 tablet 3  . Dexlansoprazole 30 MG capsule Take 1 capsule (30 mg total) by mouth daily. 30 capsule 5  . EVENING PRIMROSE OIL PO Take 1 capsule by mouth 2 (two) times daily.    Marland Kitchen levothyroxine (SYNTHROID) 100 MCG tablet Take 1 tablet (100 mcg total) by mouth daily. 90 tablet 1  . Melatonin 10 MG CAPS Take by mouth.    . mometasone (NASONEX) 50 MCG/ACT nasal spray Place 2 sprays into the nose daily as needed. 17 g 1  . olmesartan-hydrochlorothiazide (BENICAR HCT) 20-12.5 MG per tablet Take 1 tablet by mouth daily. 90 tablet 3  .  olopatadine (PATANOL) 0.1 % ophthalmic solution Place 1 drop into both eyes 2 (two) times daily as needed for allergies. 5 mL 1  . rosuvastatin (CRESTOR) 5 MG tablet Take 1 tablet (5 mg total) by mouth at bedtime. 90 tablet 3   No current facility-administered medications for this visit.    LABS/IMAGING: No results found for this or any previous visit (from the past 48 hour(s)). No results found.  WEIGHTS: Wt Readings from Last 3 Encounters:  08/21/15 223 lb 8 oz (101.379 kg)  07/16/15 225 lb 12.8 oz (102.422 kg)  06/11/15 222 lb 11.2 oz (101.016 kg)    VITALS: BP 132/88 mmHg  Pulse 74  Ht 5\' 7"  (1.702 m)  Wt 223 lb 8 oz (101.379 kg)  BMI 35.00 kg/m2  LMP 02/11/2011  EXAM: General appearance: alert  and no distress Neck: no carotid bruit and no JVD Lungs: clear to auscultation bilaterally Heart: regular rate and rhythm, S1, S2 normal, systolic murmur: early systolic 3/6, blowing at apex and diastolic murmur: early diastolic 2/6, decrescendo at 2nd right intercostal space Abdomen: soft, non-tender; bowel sounds normal; no masses,  no organomegaly and obese Extremities: extremities normal, atraumatic, no cyanosis or edema Pulses: 2+ and symmetric Skin: Skin color, texture, turgor normal. No rashes or lesions Neurologic: Grossly normal Psych:Pleasant  EKG: Normal sinus rhythm at 74  ASSESSMENT: 1. Palpitations 2. Murmur 3. Hypertension 4. Dyslipidemia  PLAN: 1.   Emma Bonilla has a description of palpitations which are very short in duration and do not seem to be associated with bothersome symptoms such as chest pain or syncope. These episodes happen several times a month therefore recommend a 30 day monitor to see if we can capture rhythm. She also has a known history of murmur and there is clearly a systolic murmur, but possibly diastolic murmur as well. I would recommend an echocardiogram to further assess this. There is a strong family history of coronary disease and she'll need significant risk factor modification. Smoking cessation would be additionally very important and she is working on cutting back. Blood pressure appears to be at goal today and her cholesterol most recently was assessed and well controlled.  Plan to see her back to discuss results of her monitor and echo in a few weeks. Thanks again for the kind referral.  Emma Casino, MD, [facility] Attending Cardiologist [place] 08/21/2015, 9:47 AM

## 2015-08-28 ENCOUNTER — Encounter: Payer: Self-pay | Admitting: *Deleted

## 2015-08-28 NOTE — Progress Notes (Signed)
Patient ID: Emma Bonilla, female   DOB: [birth year], 59 y.o.   MRN: [id number] Patient enrolled with [employer] to have a 30 day cardiac event monitor sent to her home pending approval of application for [employer] hardship program.

## 2015-08-29 ENCOUNTER — Other Ambulatory Visit: Payer: Self-pay

## 2015-08-29 ENCOUNTER — Ambulatory Visit (HOSPITAL_COMMUNITY): Payer: No Typology Code available for payment source | Attending: Internal Medicine

## 2015-08-29 DIAGNOSIS — Z8249 Family history of ischemic heart disease and other diseases of the circulatory system: Secondary | ICD-10-CM | POA: Insufficient documentation

## 2015-08-29 DIAGNOSIS — I517 Cardiomegaly: Secondary | ICD-10-CM | POA: Insufficient documentation

## 2015-08-29 DIAGNOSIS — R011 Cardiac murmur, unspecified: Secondary | ICD-10-CM | POA: Insufficient documentation

## 2015-08-29 DIAGNOSIS — I358 Other nonrheumatic aortic valve disorders: Secondary | ICD-10-CM | POA: Insufficient documentation

## 2015-08-29 DIAGNOSIS — I071 Rheumatic tricuspid insufficiency: Secondary | ICD-10-CM | POA: Insufficient documentation

## 2015-08-29 DIAGNOSIS — I10 Essential (primary) hypertension: Secondary | ICD-10-CM | POA: Insufficient documentation

## 2015-08-29 DIAGNOSIS — I352 Nonrheumatic aortic (valve) stenosis with insufficiency: Secondary | ICD-10-CM | POA: Insufficient documentation

## 2015-08-29 DIAGNOSIS — E785 Hyperlipidemia, unspecified: Secondary | ICD-10-CM | POA: Insufficient documentation

## 2015-08-29 DIAGNOSIS — I340 Nonrheumatic mitral (valve) insufficiency: Secondary | ICD-10-CM | POA: Insufficient documentation

## 2015-08-29 DIAGNOSIS — F17200 Nicotine dependence, unspecified, uncomplicated: Secondary | ICD-10-CM | POA: Insufficient documentation

## 2015-08-29 DIAGNOSIS — R002 Palpitations: Secondary | ICD-10-CM

## 2015-08-31 ENCOUNTER — Telehealth: Payer: Self-pay | Admitting: Internal Medicine

## 2015-08-31 NOTE — Telephone Encounter (Signed)
08-31-15 Spoke with Earlie Server with [employer]  patient was approved for hardship and monitor was mailed to her on 08-30-15/saf

## 2015-09-03 ENCOUNTER — Ambulatory Visit (INDEPENDENT_AMBULATORY_CARE_PROVIDER_SITE_OTHER): Payer: No Typology Code available for payment source

## 2015-09-03 DIAGNOSIS — R002 Palpitations: Secondary | ICD-10-CM

## 2015-10-05 ENCOUNTER — Other Ambulatory Visit: Payer: Self-pay

## 2015-10-05 DIAGNOSIS — J302 Other seasonal allergic rhinitis: Secondary | ICD-10-CM

## 2015-10-05 MED ORDER — DESLORATADINE 5 MG PO TABS: 5.0000 mg | ORAL_TABLET | Freq: Every day | ORAL | Status: DC

## 2015-10-05 NOTE — Telephone Encounter (Signed)
Needs Dec appt with PCP

## 2015-10-08 ENCOUNTER — Other Ambulatory Visit: Payer: Self-pay

## 2015-10-08 NOTE — Telephone Encounter (Signed)
rx phoned in, also sent message to have pt scheduled with PCP

## 2015-10-09 ENCOUNTER — Encounter: Payer: Self-pay | Admitting: Internal Medicine

## 2015-10-09 MED ORDER — MOMETASONE FUROATE 50 MCG/ACT NA SUSP: 2.0000 | Freq: Every day | NASAL | Status: DC | PRN

## 2015-10-18 ENCOUNTER — Encounter: Payer: Self-pay | Admitting: Internal Medicine

## 2015-10-18 ENCOUNTER — Ambulatory Visit (INDEPENDENT_AMBULATORY_CARE_PROVIDER_SITE_OTHER): Payer: No Typology Code available for payment source | Admitting: Internal Medicine

## 2015-10-18 VITALS — BP 122/74 | HR 85 | Ht 67.0 in | Wt 223.7 lb

## 2015-10-18 DIAGNOSIS — G458 Other transient cerebral ischemic attacks and related syndromes: Secondary | ICD-10-CM

## 2015-10-18 DIAGNOSIS — I10 Essential (primary) hypertension: Secondary | ICD-10-CM

## 2015-10-18 DIAGNOSIS — R002 Palpitations: Secondary | ICD-10-CM

## 2015-10-18 DIAGNOSIS — I350 Nonrheumatic aortic (valve) stenosis: Secondary | ICD-10-CM

## 2015-10-18 DIAGNOSIS — E785 Hyperlipidemia, unspecified: Secondary | ICD-10-CM

## 2015-10-18 DIAGNOSIS — R011 Cardiac murmur, unspecified: Secondary | ICD-10-CM

## 2015-10-18 MED ORDER — METOPROLOL SUCCINATE ER 25 MG PO TB24: 12.5000 mg | ORAL_TABLET | Freq: Every day | ORAL | Status: DC

## 2015-10-18 NOTE — Patient Instructions (Signed)
Your physician has recommended you make the following change in your medication: START metoprolol succinate 12.5mg  once daily @ night  >> you will need to cut the 25mg  tablets in half  Your physician recommends that you schedule a follow-up appointment in: 6 weeks with Dr. Debara Pickett

## 2015-10-18 NOTE — Progress Notes (Signed)
OFFICE NOTE  Chief Complaint:  Follow-up echo and monitor  Primary Care Physician: Michail Jewels, MD  HPI:  Emma Bonilla is a pleasant 59 year old female is currently referred to me from the internal medicine clinic for evaluation of palpitations. Emma Bonilla has a strong family history of heart disease with both her mother who had congestive heart failure in her father who had died of an MI. She has a personal history of hypertension, dyslipidemia, about a 40-50 pack year smoking history and a known murmur. She presents for evaluation of palpitations. She reports this is been going on for about 2 months and mostly takes place at rest. She notices at night. There are episodes were heart races for several seconds and then goes back to normal. She said she's had 2 or 3 of these episodes in the past month. She does report a prior history of TIA in 2010 and started taking aspirin after that. She has good control of her cholesterol on Crestor. She denies any chest pain or worsening shortness of breath. She's never had presyncope or syncopal symptoms.  Emma Bonilla returns today for follow-up of echo and monitor. The echocardiogram shows an EF of [id number] with diastolic dysfunction. Additionally there was mild aortic stenosis with a functionally bicuspid valve which is moderately calcified. There was moderate regurgitation. The mean gradient was 13 mmHg. She also had a monitor which demonstrated PACs and PVCs but otherwise sinus rhythm. No A. fib or ventricular arrhythmias were noted. She reports her palpitations have picked up some since she wore the monitor however suspect it's more of the same. She denies any cardiac sounding chest pain or worsening shortness of breath with exertion although I have a high retest probability of coronary disease.  PMHx:  Past Medical History  Diagnosis Date  . Hyperlipidemia   . Hypertension   . Arthritis   . Thyroid disease   . Depression   . GERD  (gastroesophageal reflux disease)   . TIA (transient ischemic attack)     2010  . Heart murmur     Past Surgical History  Procedure Laterality Date  . Dilation and curettage of uterus      30 years ago    FAMHx:  Family History  Problem Relation Age of Onset  . Heart disease Mother     CHF  . Hypertension Mother   . Heart disease Father   . Hypertension Father   . Heart disease Sister     LVAD (2014)  . Hypertension Sister   . Diabetes Sister   . Diabetes Brother   . Skin cancer Maternal Grandmother   . Stroke Maternal Grandfather   . Valvular heart disease Sister     leaky valve  . Hypertension Sister   . Hyperlipidemia Sister   . Hypertension Sister   . Hypertension Child   . Hyperlipidemia Child   . Hyperlipidemia Child     SOCHx:   reports that she has been smoking Cigarettes.  She has a 22.5 pack-year smoking history. She does not have any smokeless tobacco history on file. She reports that she uses illicit drugs (Marijuana). Her alcohol history is not on file.  ALLERGIES:  Allergies  Allergen Reactions  . Ibuprofen Rash  . Naproxen Sodium Rash    ROS: A comprehensive review of systems was negative except for: Cardiovascular: positive for palpitations  HOME MEDS: Current Outpatient Prescriptions  Medication Sig Dispense Refill  . amitriptyline (ELAVIL) 25 MG tablet TAKE ONE TABLET  BY MOUTH AT BEDTIME 90 tablet 3  . aspirin 81 MG tablet Take 1 tablet (81 mg total) by mouth daily. 30 tablet 3  . Biotin 5000 MCG CAPS Take by mouth.    . calcium-vitamin D (OSCAL WITH D) 500-200 MG-UNIT per tablet Take 1 tablet by mouth daily. 30 tablet 3  . desloratadine (CLARINEX) 5 MG tablet Take 1 tablet (5 mg total) by mouth daily. 90 tablet 3  . Dexlansoprazole 30 MG capsule Take 1 capsule (30 mg total) by mouth daily. 30 capsule 5  . EVENING PRIMROSE OIL PO Take 1 capsule by mouth 2 (two) times daily.    Marland Kitchen levothyroxine (SYNTHROID) 100 MCG tablet Take 1 tablet (100  mcg total) by mouth daily. 90 tablet 1  . Melatonin 10 MG CAPS Take by mouth.    . mometasone (NASONEX) 50 MCG/ACT nasal spray Place 2 sprays into the nose daily as needed. 17 g 1  . olmesartan-hydrochlorothiazide (BENICAR HCT) 20-12.5 MG per tablet Take 1 tablet by mouth daily. 90 tablet 3  . olopatadine (PATANOL) 0.1 % ophthalmic solution Place 1 drop into both eyes 2 (two) times daily as needed for allergies. 5 mL 1  . rosuvastatin (CRESTOR) 5 MG tablet Take 1 tablet (5 mg total) by mouth at bedtime. 90 tablet 3  . metoprolol succinate (TOPROL-XL) 25 MG 24 hr tablet Take 0.5 tablets (12.5 mg total) by mouth daily. 15 tablet 6   No current facility-administered medications for this visit.    LABS/IMAGING: No results found for this or any previous visit (from the past 48 hour(s)). No results found.  WEIGHTS: Wt Readings from Last 3 Encounters:  10/18/15 223 lb 11.2 oz (101.47 kg)  08/21/15 223 lb 8 oz (101.379 kg)  07/16/15 225 lb 12.8 oz (102.422 kg)    VITALS: BP 122/74 mmHg  Pulse 85  Ht 5\' 7"  (1.702 m)  Wt 223 lb 11.2 oz (101.47 kg)  BMI 35.03 kg/m2  SpO2 97%  LMP 02/11/2011  EXAM: Deferred  EKG: Deferred  ASSESSMENT: 1. Palpitations - PACs and PVCs 2. 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 Hypertension 4. Dyslipidemia  PLAN: 1.   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 It's not clear if she had a bicuspid valve that she was born with or that it has developed by calcification. She did report today that her daughter, who was accompanying her, does have a murmur that she was born with which could suggest a bicuspid valve congenitally. Nevertheless, she needs maximal treatment for both probable coronary artery disease and valvular heart disease. She is on statin therapy, antihypertensive medications, aspirin and low-dose diuretic. She still complains of some shortness of  breath with exertion which could be due to her aortic insufficiency, or her obesity. I would like to start low-dose beta blocker to help suppress some of her PACs and PVCs which should help with her palpitations and service medical therapy for her coronary disease. She may need stress testing when we see her back. This is of course complicated by the fact that she has no health insurance other than the "orange card". I highly encouraged her to work to try to get some sort of health insurance as it is likely that she will need valve replacement at some point in the future.  Plan to see her back in a few months and we may consider stress testing at that time.  Pixie Casino,  MD, [facility] Attending Cardiologist [place] 10/18/2015, 1:37 PM

## 2015-10-24 ENCOUNTER — Other Ambulatory Visit: Payer: Self-pay | Admitting: Internal Medicine

## 2015-10-24 ENCOUNTER — Telehealth: Payer: Self-pay | Admitting: Internal Medicine

## 2015-10-24 NOTE — Telephone Encounter (Signed)
She takes 1/2 tablet Toprol XL 25 mg daily - therefore 12.5 mg daily. She cannot switch to metroprolol tartrate because the lowest dose is a 25 mg tablet, but if she cut that in half to be a 12.5 mg dose, it would not last throughout the day - would have to be taken twice daily which would be twice her current dose.  I would advise that she stay on the Toprol - if there is a significant cost difference paying out of pocket, we could consider a different b-blocker such as carvedilol.  Dr. Debara Pickett

## 2015-11-06 ENCOUNTER — Other Ambulatory Visit: Payer: Self-pay | Admitting: Internal Medicine

## 2015-11-06 NOTE — Telephone Encounter (Signed)
Pt requesting Dexlansoprazole to be filled @ [facility], and amitriptyline to be filled @ [place] on [address].

## 2015-11-07 MED ORDER — AMITRIPTYLINE HCL 25 MG PO TABS: 25.0000 mg | ORAL_TABLET | Freq: Every day | ORAL | Status: DC

## 2015-11-07 MED ORDER — DEXLANSOPRAZOLE 30 MG PO CPDR: 30.0000 mg | DELAYED_RELEASE_CAPSULE | Freq: Every day | ORAL | Status: DC

## 2015-11-08 NOTE — Telephone Encounter (Signed)
rx called to health dept.

## 2015-11-12 ENCOUNTER — Ambulatory Visit (INDEPENDENT_AMBULATORY_CARE_PROVIDER_SITE_OTHER): Payer: No Typology Code available for payment source | Admitting: Internal Medicine

## 2015-11-12 ENCOUNTER — Encounter: Payer: Self-pay | Admitting: Internal Medicine

## 2015-11-12 ENCOUNTER — Other Ambulatory Visit: Payer: Self-pay | Admitting: Internal Medicine

## 2015-11-12 VITALS — BP 128/77 | HR 81 | Temp 97.9°F | Ht 67.0 in | Wt 225.0 lb

## 2015-11-12 DIAGNOSIS — Z720 Tobacco use: Secondary | ICD-10-CM

## 2015-11-12 DIAGNOSIS — I10 Essential (primary) hypertension: Secondary | ICD-10-CM

## 2015-11-12 DIAGNOSIS — R002 Palpitations: Secondary | ICD-10-CM

## 2015-11-12 DIAGNOSIS — Z23 Encounter for immunization: Secondary | ICD-10-CM

## 2015-11-12 DIAGNOSIS — M1712 Unilateral primary osteoarthritis, left knee: Secondary | ICD-10-CM

## 2015-11-12 DIAGNOSIS — I350 Nonrheumatic aortic (valve) stenosis: Secondary | ICD-10-CM

## 2015-11-12 DIAGNOSIS — Z0000 Encounter for general adult medical examination without abnormal findings: Secondary | ICD-10-CM

## 2015-11-12 MED ORDER — METOPROLOL SUCCINATE ER 25 MG PO TB24
12.5000 mg | ORAL_TABLET | Freq: Every day | ORAL | Status: DC
Start: 2015-11-12 — End: 2015-12-12

## 2015-11-12 MED ORDER — MELOXICAM 7.5 MG PO TABS: 7.5000 mg | ORAL_TABLET | Freq: Every day | ORAL | Status: DC | PRN

## 2015-11-12 MED ORDER — METOPROLOL SUCCINATE ER 25 MG PO TB24: 12.5000 mg | ORAL_TABLET | Freq: Every day | ORAL | Status: DC

## 2015-11-12 NOTE — Patient Instructions (Signed)
Thank you for your visit today.   Please return to the internal medicine clinic in 4 month(s) or sooner if needed.     I have made the following additions/changes to your medications:  None, continue current medications.  You may need to speak with Dr. Lysbeth Penner office again regarding the metoprolol.   I have made the following referrals for you: I will refer you to sports medicine for your knee.   You need the following test(s) for regular health maintenance: We will give you the flu shot today.   Please be sure to bring all of your medications with you to every visit; this includes herbal supplements, vitamins, eye drops, and any over-the-counter medications.   Should you have any questions regarding your medications and/or any new or worsening symptoms, please be sure to call the clinic at [phone].   If you believe that you are suffering from a life threatening condition or one that may result in the loss of limb or function, then you should call 911 and proceed to the nearest Emergency Department.   A healthy lifestyle and preventative care can promote health and wellness.   Maintain regular health, dental, and eye exams.  Eat a healthy diet. Foods like vegetables, fruits, whole grains, low-fat dairy products, and lean protein foods contain the nutrients you need without too many calories. Decrease your intake of foods high in solid fats, added sugars, and salt. Get information about a proper diet from your caregiver, if necessary.  Regular physical exercise is one of the most important things you can do for your health. Most adults should get at least 150 minutes of moderate-intensity exercise (any activity that increases your heart rate and causes you to sweat) each week. In addition, most adults need muscle-strengthening exercises on 2 or more days a week.   Maintain a healthy weight. The body mass index (BMI) is a screening tool to identify possible weight problems. It  provides an estimate of body fat based on height and weight. Your caregiver can help determine your BMI, and can help you achieve or maintain a healthy weight. For adults 20 years and older:  A BMI below 18.5 is considered underweight.  A BMI of 18.5 to 24.9 is normal.  A BMI of 25 to 29.9 is considered overweight.  A BMI of 30 and above is considered obese.

## 2015-11-12 NOTE — Progress Notes (Signed)
Patient ID: Emma Bonilla, female   DOB: November 26, 1956, 59 y.o.   MRN: [id number]     Subjective:   Patient ID: Emma Bonilla female    DOB: March 06, 1956 59 y.o.    MRN: [id number] Health Maintenance Due: There are no preventive care reminders to display for this patient.  _________________________________________________  HPI: Ms.Jericho E Sterner is a 59 y.o. female here for a routine visit.  Pt has a PMH outlined below.  Please see problem-based charting assessment and plan for further status of patient's chronic medical problems addressed at today's visit.  PMH: Past Medical History  Diagnosis Date  . Hyperlipidemia   . Hypertension   . Arthritis   . Thyroid disease   . Depression   . GERD (gastroesophageal reflux disease)   . TIA (transient ischemic attack)     2010  . Heart murmur     Medications: Current Outpatient Prescriptions on File Prior to Visit  Medication Sig Dispense Refill  . amitriptyline (ELAVIL) 25 MG tablet Take 1 tablet (25 mg total) by mouth at bedtime. 90 tablet 3  . aspirin 81 MG tablet Take 1 tablet (81 mg total) by mouth daily. 30 tablet 3  . Biotin 5000 MCG CAPS Take by mouth.    . calcium-vitamin D (OSCAL WITH D) 500-200 MG-UNIT per tablet Take 1 tablet by mouth daily. 30 tablet 3  . desloratadine (CLARINEX) 5 MG tablet Take 1 tablet (5 mg total) by mouth daily. 90 tablet 3  . Dexlansoprazole 30 MG capsule Take 1 capsule (30 mg total) by mouth daily. 30 capsule 3  . EVENING PRIMROSE OIL PO Take 1 capsule by mouth 2 (two) times daily.    Marland Kitchen levothyroxine (SYNTHROID) 100 MCG tablet Take 1 tablet (100 mcg total) by mouth daily. 90 tablet 1  . Melatonin 10 MG CAPS Take by mouth.    . mometasone (NASONEX) 50 MCG/ACT nasal spray Place 2 sprays into the nose daily as needed. 17 g 1  . olmesartan-hydrochlorothiazide (BENICAR HCT) 20-12.5 MG per tablet Take 1 tablet by mouth daily. 90 tablet 3  . olopatadine (PATANOL) 0.1 % ophthalmic solution Place 1 drop into  both eyes 2 (two) times daily as needed for allergies. 5 mL 1  . rosuvastatin (CRESTOR) 5 MG tablet Take 1 tablet (5 mg total) by mouth at bedtime. 90 tablet 3   No current facility-administered medications on file prior to visit.    Allergies: Allergies  Allergen Reactions  . Ibuprofen Rash  . Naproxen Sodium Rash    FH: Family History  Problem Relation Age of Onset  . Heart disease Mother     CHF  . Hypertension Mother   . Heart disease Father   . Hypertension Father   . Heart disease Sister     LVAD (2014)  . Hypertension Sister   . Diabetes Sister   . Diabetes Brother   . Skin cancer Maternal Grandmother   . Stroke Maternal Grandfather   . Valvular heart disease Sister     leaky valve  . Hypertension Sister   . Hyperlipidemia Sister   . Hypertension Sister   . Hypertension Child   . Hyperlipidemia Child   . Hyperlipidemia Child     SH: Social History   Social History  . Marital Status: Married    Spouse Name: N/A  . Number of Children: 4  . Years of Education: 64   Social History Main Topics  . Smoking status: Current Every Day Smoker --  0.50 packs/day for 45 years    Types: Cigarettes  . Smokeless tobacco: None     Comment: Wellbutrin really helped - not taking any more  . Alcohol Use: None     Comment: occasional  . Drug Use: Yes    Special: Marijuana     Comment: occasional, last 3 months ago  . Sexual Activity: Yes    Birth Control/ Protection: None   Other Topics Concern  . None   Social History Narrative   Epworth Sleepiness Scale (08/21/15) = 9    Review of Systems: Constitutional: Negative for fever, chills and weight loss.  Eyes: Negative for blurred vision.  Respiratory: Negative for cough and shortness of breath.  Cardiovascular: Negative for chest pain, +palpitations, neg leg swelling.  Gastrointestinal: Negative for nausea, vomiting, abdominal pain, diarrhea, constipation and blood in stool.  Genitourinary: Negative for dysuria,  urgency and frequency.  Musculoskeletal: Negative for myalgias and back pain, +left knee pain  Neurological: Negative for dizziness, weakness and headaches.     Objective:   Vital Signs: Filed Vitals:   11/12/15 1448  BP: 128/77  Pulse: 81  Temp: 97.9 F (36.6 C)  TempSrc: Oral  Height: 5\' 7"  (1.702 m)  Weight: 102.059 kg (225 lb)  SpO2: 100%      BP Readings from Last 3 Encounters:  11/12/15 128/77  10/18/15 122/74  08/21/15 132/88    Physical Exam: Constitutional: Vital signs reviewed.  Patient is in NAD and cooperative with exam.  Head: Normocephalic and atraumatic. Eyes: EOMI, conjunctivae nl, no scleral icterus.  Neck: Supple. Cardiovascular: RRR, no MRG. Pulmonary/Chest: normal effort, CTAB, no wheezes, rales, or rhonchi. Abdominal: Soft. NT/ND +BS. Musculoskeletal: L knee FROM but with pain, minimal swelling, and warmth.  Anterior tenderness to palpation.  Mild crepitus.  No effusion appreciated.  Unable to assess erythema d/t pt wearing long johns.  R knee normal.   Neurological: A&O x3, cranial nerves II-XII are grossly intact, moving all extremities. Extremities: 2+DP b/l; no pitting edema. Skin: Warm, dry and intact. No rash.   Assessment & Plan:   Assessment and plan was discussed and formulated with my attending.

## 2015-11-12 NOTE — Telephone Encounter (Signed)
Electronic rx sent to pharmacy, metoprolol 25 mg 30 day supply.

## 2015-11-12 NOTE — Telephone Encounter (Signed)
°*  STAT* If patient is at the pharmacy, call can be transferred to refill team.   1. Which medications need to be refilled? (please list name of each medication and dose if known) Metoprolol Succinate 25mg ( cut tablet in half and take 12.5 once daily)   2. Which pharmacy/location (including street and city if local pharmacy) is medication to be sent to?[facility]  3. Do they need a 30 day or 90 day supply? [place]

## 2015-11-12 NOTE — Assessment & Plan Note (Addendum)
She wore an event monitor in October which revealed occasional PACs and PVCs.  Has bicuspid aortic valve and will likely need replacement at some point.  Saw Dr. Debara Pickett on 11/17 for f/u and tates he wants to see her back in 6 wks for possible stress testing.  She was started on metoprolol XL 12.5mg  at that visit.  He would like her to obtain some type of insurance besides the "orange card" in anticipation for future testing and valve replacement.  Upon questioning she has not started taking the metoprolol due to the [facility] pharmacy [place] to get it?  I asked her to touch base with Dr. Lysbeth Penner office to see if they could prescribe something different that the county pharmacy would be able to get.   -f/u with Dr. Debara Pickett in 6 wks -cont metoprolol 12.5mg  XL

## 2015-11-12 NOTE — Assessment & Plan Note (Signed)
-  influenza vaccine today  

## 2015-11-12 NOTE — Telephone Encounter (Signed)
Metoprolol refilled electronically.  First refill printed.

## 2015-11-13 NOTE — Assessment & Plan Note (Signed)
-  discussed smoking cessation, states she has cut back to ~5-6 cig/day

## 2015-11-13 NOTE — Assessment & Plan Note (Signed)
States the palpitations have increased since seeing Dr. Debara Pickett. -plans per above

## 2015-11-13 NOTE — Assessment & Plan Note (Signed)
-  cont current meds 

## 2015-11-13 NOTE — Assessment & Plan Note (Addendum)
Pt still c/o left knee pain.  XR with OA.  L knee without effusion, deformity, but positive for warmth and mild crepitus.  Is amenable to injection but I will not be here for the next month and a half so she is willing to go to [facility].  She has been taking 4 ibuprofen 4 times daily with mild benefit.  -meloxicam 7.5mg  daily PRN for pain (reminded her not to take ibuprofen with meloxicam and to cont PPI) -referral to [facility] for knee injection

## 2015-11-15 NOTE — Progress Notes (Signed)
Internal Medicine Clinic Attending  Case discussed with Dr. Gill soon after the resident saw the patient.  We reviewed the resident's history and exam and pertinent patient test results.  I agree with the assessment, diagnosis, and plan of care documented in the resident's note.  

## 2015-11-22 ENCOUNTER — Encounter: Payer: Self-pay | Admitting: Sports Medicine

## 2015-11-22 ENCOUNTER — Ambulatory Visit
Admission: RE | Admit: 2015-11-22 | Discharge: 2015-11-22 | Disposition: A | Discharge status: Home / Self Care | Payer: No Typology Code available for payment source | Source: Ambulatory Visit | Attending: Sports Medicine | Admitting: Sports Medicine

## 2015-11-22 ENCOUNTER — Ambulatory Visit (INDEPENDENT_AMBULATORY_CARE_PROVIDER_SITE_OTHER): Payer: Self-pay | Admitting: Sports Medicine

## 2015-11-22 VITALS — BP 120/70 | Ht 67.0 in | Wt 225.0 lb

## 2015-11-22 DIAGNOSIS — M25562 Pain in left knee: Secondary | ICD-10-CM

## 2015-11-22 DIAGNOSIS — M25561 Pain in right knee: Secondary | ICD-10-CM

## 2015-11-22 MED ORDER — TRAMADOL HCL 50 MG PO TABS: 50.0000 mg | ORAL_TABLET | Freq: Two times a day (BID) | ORAL | Status: DC

## 2015-11-22 NOTE — Patient Instructions (Signed)
I would like for you to get x-rays of your knees  Go ahead and take 2 of the 7.5 mg meloxicam tablets in the morning with food  I've given you a prescription for tramadol 50 mg to take twice a day as needed for pain  He can try a compression sleeve if you would like  I would like to see you back in 4 weeks to see how you're doing. If I need to I can do a cortisone injection at that time

## 2015-11-23 NOTE — Progress Notes (Signed)
   Subjective:    Patient ID: Emma Bonilla, female    DOB: 03/23/1956, 59 y.o.   MRN: [id number]  HPI chief complaint: Bilateral knee pain  Very pleasant 59 year old female comes in today complaining of 2 years of bilateral knee pain. No trauma that she can recall. No prior surgeries. She complains of a diffuse ache in both knees, left greater than right. She does get some intermittent swelling as well. No mechanical symptoms. She was given a prescription by her primary care physician for 7.5 mg of meloxicam and has noticed a difference when taking this medicine. She denies numbness or tingling. Mild pain at rest. She has tried physical therapy in the past with minimal improvement. She is here today with her daughter.  Past medical history reviewed Medications reviewed Allergies reviewed    Review of Systems    as above Objective:   Physical Exam  Well-developed, well-nourished. No acute distress. Awake alert and oriented 3. Vital signs reviewed.  Right knee: Full range of motion. No effusion. Trace patellofemoral crepitus. Mild tenderness to palpation along the medial and lateral joint lines but a negative McMurray's. Good joint stability. Good strength. Neurovascularly intact distally. Overall good alignment.  Left knee: Full range of motion. No effusion. 1+ boggy synovitis. Trace patellofemoral crepitus. Mild tenderness to palpation along the medial and lateral joint lines but negative McMurray's. Good joint stability. Good strength. Overall good alignment. Neurovascularly intact distally.  X-rays of both knees are obtained. AP, lateral, sunrise, and 30 flexion views are reviewed. Minimal degenerative changes are seen. Nothing acute.      Assessment & Plan:  Bilateral knee pain secondary to mild DJD  Patient's knee pain has responded favorably to 7.5 mg of meloxicam. I would like for her to increase this to 15 mg daily and I've also given her a prescription for tramadol 50 mg  to take twice daily as needed for pain. She will follow-up with me in 4 weeks (I will review her x-rays with her at that time). Given the mild changes seen on her x-rays I am hopeful that we can manage this with these simple medications, but if her pain persists or worsens I would consider cortisone injections. Patient and her daughter will call me with questions or concerns prior to her follow-up visit.

## 2015-12-12 ENCOUNTER — Ambulatory Visit (INDEPENDENT_AMBULATORY_CARE_PROVIDER_SITE_OTHER): Payer: No Typology Code available for payment source | Admitting: Internal Medicine

## 2015-12-12 VITALS — BP 134/86 | HR 86 | Ht 67.0 in | Wt 228.6 lb

## 2015-12-12 DIAGNOSIS — Z0000 Encounter for general adult medical examination without abnormal findings: Secondary | ICD-10-CM

## 2015-12-12 DIAGNOSIS — R002 Palpitations: Secondary | ICD-10-CM

## 2015-12-12 DIAGNOSIS — E785 Hyperlipidemia, unspecified: Secondary | ICD-10-CM

## 2015-12-12 DIAGNOSIS — I350 Nonrheumatic aortic (valve) stenosis: Secondary | ICD-10-CM

## 2015-12-12 MED ORDER — METOPROLOL SUCCINATE ER 25 MG PO TB24: 12.5000 mg | ORAL_TABLET | Freq: Every day | ORAL | Status: DC

## 2015-12-12 NOTE — Patient Instructions (Signed)
Dr Debara Pickett has made no changes today in your current medications or treatment plan.  Your physician recommends that you schedule a follow-up appointment in 6 months. You will receive a reminder letter in the mail two months in advance. If you don't receive a letter, please call our office to schedule the follow-up appointment.  If you need a refill on your cardiac medications before your next appointment, please call your pharmacy.

## 2015-12-13 ENCOUNTER — Encounter: Payer: Self-pay | Admitting: Internal Medicine

## 2015-12-13 NOTE — Progress Notes (Signed)
OFFICE NOTE  Chief Complaint:  Follow-up  Primary Care Physician: Michail Jewels, MD  HPI:  Emma Bonilla is a pleasant 60 year old female is currently referred to me from the internal medicine clinic for evaluation of palpitations. Ms. Bonilla has a strong family history of heart disease with both her mother who had congestive heart failure in her father who had died of an MI. She has a personal history of hypertension, dyslipidemia, about a 40-50 pack year smoking history and a known murmur. She presents for evaluation of palpitations. She reports this is been going on for about 2 months and mostly takes place at rest. She notices at night. There are episodes were heart races for several seconds and then goes back to normal. She said she's had 2 or 3 of these episodes in the past month. She does report a prior history of TIA in 2010 and started taking aspirin after that. She has good control of her cholesterol on Crestor. She denies any chest pain or worsening shortness of breath. She's never had presyncope or syncopal symptoms.  Emma Bonilla returns today for follow-up of echo and monitor. The echocardiogram shows an EF of [id number] with diastolic dysfunction. Additionally there was mild aortic stenosis with a functionally bicuspid valve which is moderately calcified. There was moderate regurgitation. The mean gradient was 13 mmHg. She also had a monitor which demonstrated PACs and PVCs but otherwise sinus rhythm. No A. fib or ventricular arrhythmias were noted. She reports her palpitations have picked up some since she wore the monitor however suspect it's more of the same. She denies any cardiac sounding chest pain or worsening shortness of breath with exertion although I have a high retest probability of coronary disease.  I saw Ms. Bonilla back today in follow-up. She has since been started on low-dose beta blocker and is tolerating it well. She says this is significantly help with her  palpitations and she has no more symptoms.  PMHx:  Past Medical History  Diagnosis Date  . Hyperlipidemia   . Hypertension   . Arthritis   . Thyroid disease   . Depression   . GERD (gastroesophageal reflux disease)   . TIA (transient ischemic attack)     2010  . Heart murmur     Past Surgical History  Procedure Laterality Date  . Dilation and curettage of uterus      30 years ago    FAMHx:  Family History  Problem Relation Age of Onset  . Heart disease Mother     CHF  . Hypertension Mother   . Heart disease Father   . Hypertension Father   . Heart disease Sister     LVAD (2014)  . Hypertension Sister   . Diabetes Sister   . Diabetes Brother   . Skin cancer Maternal Grandmother   . Stroke Maternal Grandfather   . Valvular heart disease Sister     leaky valve  . Hypertension Sister   . Hyperlipidemia Sister   . Hypertension Sister   . Hypertension Child   . Hyperlipidemia Child   . Hyperlipidemia Child     SOCHx:   reports that she has been smoking Cigarettes.  She has a 22.5 pack-year smoking history. She does not have any smokeless tobacco history on file. She reports that she uses illicit drugs (Marijuana). Her alcohol history is not on file.  ALLERGIES:  Allergies  Allergen Reactions  . Ibuprofen Rash  . Naproxen Sodium Rash  ROS: A comprehensive review of systems was negative.  HOME MEDS: Current Outpatient Prescriptions  Medication Sig Dispense Refill  . amitriptyline (ELAVIL) 25 MG tablet Take 1 tablet (25 mg total) by mouth at bedtime. 90 tablet 3  . aspirin 81 MG tablet Take 1 tablet (81 mg total) by mouth daily. 30 tablet 3  . Biotin 5000 MCG CAPS Take by mouth.    . calcium-vitamin D (OSCAL WITH D) 500-200 MG-UNIT per tablet Take 1 tablet by mouth daily. 30 tablet 3  . desloratadine (CLARINEX) 5 MG tablet Take 1 tablet (5 mg total) by mouth daily. 90 tablet 3  . Dexlansoprazole 30 MG capsule Take 1 capsule (30 mg total) by mouth daily. 30  capsule 3  . EVENING PRIMROSE OIL PO Take 1 capsule by mouth 2 (two) times daily.    Marland Kitchen levothyroxine (SYNTHROID) 100 MCG tablet Take 1 tablet (100 mcg total) by mouth daily. 90 tablet 1  . Melatonin 10 MG CAPS Take by mouth.    . meloxicam (MOBIC) 7.5 MG tablet Take 1 tablet (7.5 mg total) by mouth daily as needed for pain. 30 tablet 0  . metoprolol succinate (TOPROL-XL) 25 MG 24 hr tablet Take 0.5 tablets (12.5 mg total) by mouth daily. 15 tablet 11  . mometasone (NASONEX) 50 MCG/ACT nasal spray Place 2 sprays into the nose daily as needed. 17 g 1  . olmesartan-hydrochlorothiazide (BENICAR HCT) 20-12.5 MG per tablet Take 1 tablet by mouth daily. 90 tablet 3  . olopatadine (PATANOL) 0.1 % ophthalmic solution Place 1 drop into both eyes 2 (two) times daily as needed for allergies. 5 mL 1  . rosuvastatin (CRESTOR) 5 MG tablet Take 1 tablet (5 mg total) by mouth at bedtime. 90 tablet 3  . traMADol (ULTRAM) 50 MG tablet Take 1 tablet (50 mg total) by mouth 2 (two) times daily. 60 tablet 0   No current facility-administered medications for this visit.    LABS/IMAGING: No results found for this or any previous visit (from the past 48 hour(s)). No results found.  WEIGHTS: Wt Readings from Last 3 Encounters:  12/12/15 228 lb 9.6 oz (103.692 kg)  11/22/15 225 lb (102.059 kg)  11/12/15 225 lb (102.059 kg)    VITALS: BP 134/86 mmHg  Pulse 86  Ht 5\' 7"  (1.702 m)  Wt 228 lb 9.6 oz (103.692 kg)  BMI 35.80 kg/m2  LMP 02/11/2011  EXAM: Deferred  EKG: Deferred  ASSESSMENT: 1. Palpitations - PACs and PVCs - resolved with beta blocker 2. 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 Hypertension 4. Dyslipidemia  PLAN: 1.   Emma Bonilla has had resolution of her palpitations with beta blocker. She has persistent mild aortic stenosis and probably a functionally bicuspid valve. There is moderate aortic insufficiency. This will need  routine follow-up and I'll plan to see her back for this. Blood pressure is at goal today. Follow-up in 6 months or sooner as necessary.  Pixie Casino, MD, [facility] Attending Cardiologist [place] C Nicolas Sisler 12/13/2015, 11:20 AM

## 2015-12-24 ENCOUNTER — Encounter: Payer: Self-pay | Admitting: Sports Medicine

## 2015-12-24 ENCOUNTER — Ambulatory Visit (INDEPENDENT_AMBULATORY_CARE_PROVIDER_SITE_OTHER): Payer: Self-pay | Admitting: Sports Medicine

## 2015-12-24 VITALS — BP 149/70 | Ht 67.0 in | Wt 225.0 lb

## 2015-12-24 DIAGNOSIS — M170 Bilateral primary osteoarthritis of knee: Secondary | ICD-10-CM

## 2015-12-24 MED ORDER — MELOXICAM 15 MG PO TABS: 15.0000 mg | ORAL_TABLET | Freq: Every day | ORAL | Status: DC

## 2015-12-24 NOTE — Progress Notes (Signed)
   Subjective:    Patient ID: Emma Bonilla, female    DOB: Dec 01, 1956, 60 y.o.   MRN: [id number]  HPI Patient is a 60 year-old female who returns for follow up of bilateral knee pain L>R. She was last seen on 11/22/15 and since that time has been taking Meloxicam 15mg  daily as needed. She reports the Meloxicam has helped greatly with her knee pain and swelling and that she has averaged taking it every 3rd day. She notices that if she takes it on consecutive days her pain is a 1-2/10 and if she skips 1-2 days in between doses it is still a manageable 2-3/10. If she goes more than 2-3 days without taking it she states she notices increased pain to 5+/10 and increased swelling of her left knee. Overall since being seen last month her pain has been more manageable, she has noticed decreased swelling, and is pleased with the progress she has seen. She only notes some occasional stiffness in the left knee that is worse when she notices more swelling. She has had no issues with the right knee since increasing the Meloxicam dose to 15mg  when seen on 12/22. She reports that she has discussed taking Meloxicam with her family physician and she states that she was told it was ok for her to take as needed as long as she takes her reflux medicine as well. She denies any issues with mobility and also denies any locking, catching or clicking in either knee joint. She is taking Ultram as needed as well which does help some   Review of Systems Otherwise negative except as stated in HPI.    Objective:   Physical Exam General: African-american female resting in chair in no acute distress Right Knee: Full range of motion intact with no effusion or redness noted. Not tender to palpation at medial or lateral joint lines compared to mildly tender previously. Neurovascularly intact distally. Left Knee: 1+ Boggy synovitis with full range of motion intact. No effusion or redness noted. Mildly tender to palpation at medial and  lateral joint lines. Trace patellofemoral crepitus present. Neurovascularly intact distally.       Assessment & Plan:  Bilateral Knee Pain L>R likely secondary to mild degenerative changes/osteoarthritis -Continue Meloxicam 15mg  with refills provided at this visit. Patient currently taking on average every 3rd day and discussed at length with patient risks of medicine related to history of reflux and TIA's. Discussed that taking daily was not best option with regard to risk however every 3rd day would likely have low enough risk to be acceptable. Discussed that patient should continue to bring up that she is taking this medication when seeing her primary care physician. Patient acknowledged she understood risks of medication and would take it only as needed skipping days between doses -Continue Ultram as needed -Follow up as needed with further management of symptoms dependent on continued relief with medication. If patient has worsening symptoms despite medication or becomes unable to take medication would consider corticosteroid injections at that time

## 2016-01-29 ENCOUNTER — Other Ambulatory Visit: Payer: Self-pay

## 2016-01-29 MED ORDER — LEVOTHYROXINE SODIUM 100 MCG PO TABS: 100.0000 ug | ORAL_TABLET | Freq: Every day | ORAL | Status: DC

## 2016-01-29 NOTE — Telephone Encounter (Signed)
Pt states she has been out since Jan and [facility] has faxed for refills

## 2016-01-30 NOTE — Telephone Encounter (Signed)
Called in to pharmacy, will let pt know

## 2016-02-07 ENCOUNTER — Ambulatory Visit: Payer: No Typology Code available for payment source

## 2016-03-07 ENCOUNTER — Other Ambulatory Visit: Payer: Self-pay

## 2016-03-10 MED ORDER — DEXLANSOPRAZOLE 30 MG PO CPDR: 30.0000 mg | DELAYED_RELEASE_CAPSULE | Freq: Every day | ORAL | Status: DC

## 2016-03-10 MED ORDER — MOMETASONE FUROATE 50 MCG/ACT NA SUSP: 2.0000 | Freq: Every day | NASAL | Status: DC | PRN

## 2016-03-17 ENCOUNTER — Encounter: Payer: Self-pay | Admitting: Internal Medicine

## 2016-03-17 ENCOUNTER — Ambulatory Visit (INDEPENDENT_AMBULATORY_CARE_PROVIDER_SITE_OTHER): Payer: No Typology Code available for payment source | Admitting: Internal Medicine

## 2016-03-17 VITALS — BP 120/70 | HR 81 | Temp 98.2°F | Ht 67.0 in | Wt 238.3 lb

## 2016-03-17 DIAGNOSIS — I10 Essential (primary) hypertension: Secondary | ICD-10-CM

## 2016-03-17 DIAGNOSIS — N644 Mastodynia: Secondary | ICD-10-CM

## 2016-03-17 DIAGNOSIS — N907 Vulvar cyst: Secondary | ICD-10-CM

## 2016-03-17 MED ORDER — OLOPATADINE HCL 0.2 % OP SOLN: 1.0000 [drp] | Freq: Every day | OPHTHALMIC | Status: DC | PRN

## 2016-03-17 NOTE — Assessment & Plan Note (Signed)
BP well controlled.  -cont current meds 

## 2016-03-17 NOTE — Progress Notes (Signed)
Patient ID: Emma Bonilla, female   DOB: 1956-02-03, 60 y.o.   MRN: [id number]     Subjective:   Patient ID: Emma Bonilla female    DOB: 1956-02-27 60 y.o.    MRN: [id number] Health Maintenance Due: There are no preventive care reminders to display for this patient.  _________________________________________________  HPI: Emma Bonilla is a 60 y.o. female here for a follow up visit for left breast pain.  Pt has a PMH outlined below.  Please see problem-based charting assessment and plan for further status of patient's chronic medical problems addressed at today's visit.  PMH: Past Medical History  Diagnosis Date  . Hyperlipidemia   . Hypertension   . Arthritis   . Thyroid disease   . Depression   . GERD (gastroesophageal reflux disease)   . TIA (transient ischemic attack)     2010  . Heart murmur     Medications: Current Outpatient Prescriptions on File Prior to Visit  Medication Sig Dispense Refill  . amitriptyline (ELAVIL) 25 MG tablet Take 1 tablet (25 mg total) by mouth at bedtime. 90 tablet 3  . aspirin 81 MG tablet Take 1 tablet (81 mg total) by mouth daily. 30 tablet 3  . Biotin 5000 MCG CAPS Take by mouth.    . calcium-vitamin D (OSCAL WITH D) 500-200 MG-UNIT per tablet Take 1 tablet by mouth daily. 30 tablet 3  . desloratadine (CLARINEX) 5 MG tablet Take 1 tablet (5 mg total) by mouth daily. 90 tablet 3  . Dexlansoprazole 30 MG capsule Take 1 capsule (30 mg total) by mouth daily. 90 capsule 3  . EVENING PRIMROSE OIL PO Take 1 capsule by mouth 2 (two) times daily.    Marland Kitchen levothyroxine (SYNTHROID) 100 MCG tablet Take 1 tablet (100 mcg total) by mouth daily. 90 tablet 1  . Melatonin 10 MG CAPS Take by mouth.    . meloxicam (MOBIC) 15 MG tablet Take 1 tablet (15 mg total) by mouth daily. 30 tablet 3  . meloxicam (MOBIC) 7.5 MG tablet Take 1 tablet (7.5 mg total) by mouth daily as needed for pain. 30 tablet 0  . metoprolol succinate (TOPROL-XL) 25 MG 24 hr tablet  Take 0.5 tablets (12.5 mg total) by mouth daily. 15 tablet 11  . mometasone (NASONEX) 50 MCG/ACT nasal spray Place 2 sprays into the nose daily as needed. 17 g 1  . olmesartan-hydrochlorothiazide (BENICAR HCT) 20-12.5 MG per tablet Take 1 tablet by mouth daily. 90 tablet 3  . olopatadine (PATANOL) 0.1 % ophthalmic solution Place 1 drop into both eyes 2 (two) times daily as needed for allergies. 5 mL 1  . rosuvastatin (CRESTOR) 5 MG tablet Take 1 tablet (5 mg total) by mouth at bedtime. 90 tablet 3  . traMADol (ULTRAM) 50 MG tablet Take 1 tablet (50 mg total) by mouth 2 (two) times daily. 60 tablet 0   No current facility-administered medications on file prior to visit.    Allergies: Allergies  Allergen Reactions  . Ibuprofen Rash  . Naproxen Sodium Rash    FH: Family History  Problem Relation Age of Onset  . Heart disease Mother     CHF  . Hypertension Mother   . Heart disease Father   . Hypertension Father   . Heart disease Sister     LVAD (2014)  . Hypertension Sister   . Diabetes Sister   . Diabetes Brother   . Skin cancer Maternal Grandmother   . Stroke Maternal Grandfather   .  Valvular heart disease Sister     leaky valve  . Hypertension Sister   . Hyperlipidemia Sister   . Hypertension Sister   . Hypertension Child   . Hyperlipidemia Child   . Hyperlipidemia Child     SH: Social History   Social History  . Marital Status: Married    Spouse Name: N/A  . Number of Children: 4  . Years of Education: 23   Social History Main Topics  . Smoking status: Current Every Day Smoker -- 0.50 packs/day for 45 years    Types: Cigarettes  . Smokeless tobacco: None     Comment: Wellbutrin really helped - not taking any more  . Alcohol Use: None     Comment: occasional  . Drug Use: Yes    Special: Marijuana     Comment: occasional, last 3 months ago  . Sexual Activity: Yes    Birth Control/ Protection: None   Other Topics Concern  . None   Social History  Narrative   Epworth Sleepiness Scale (08/21/15) = 9    Review of Systems: Constitutional: Negative for fever, chills.  Eyes: Negative for blurred vision.  Respiratory: Negative for cough and shortness of breath.  Cardiovascular: Negative for chest pain.  Gastrointestinal: Negative for nausea, vomiting. Neurological: Negative for dizziness.   Objective:   Vital Signs: Filed Vitals:   03/17/16 1616  BP: 120/70  Pulse: 81  Temp: 98.2 F (36.8 C)  TempSrc: Oral  Height: 5\' 7"  (1.702 m)  Weight: 238 lb 4.8 oz (108.092 kg)  SpO2: 100%      BP Readings from Last 3 Encounters:  03/17/16 120/70  12/24/15 149/70  12/12/15 134/86    Physical Exam: Constitutional: Vital signs reviewed.  Patient is in NAD and cooperative with exam.  Head: Normocephalic and atraumatic. Eyes: EOMI, conjunctivae nl, no scleral icterus.  Neck: Supple. Cardiovascular: RRR, no MRG. Breast: Left Breast: tender to palpation at 3oclock, no nipple discharge, lumps, erythema, warmth, or tenderness.  Right Breast: without tenderness, lumps, warmth, erythema or nipple discharge.   Pulmonary/Chest: normal effort, CTAB, no wheezes, rales, or rhonchi. Genitourinary: ~0.5cm left labial cyst that is without erythema, warmth, or tenderness.     Neurological: A&O x3, cranial nerves II-XII are grossly intact, moving all extremities. Extremities: No LE edema. Skin: Warm, dry and intact.    Assessment & Plan:   Assessment and plan was discussed and formulated with my attending.

## 2016-03-17 NOTE — Assessment & Plan Note (Signed)
Pt reports a "bump" on her labial area on the left side that has been there for years.  Mostly non-painful except for that it began bothering her about a week ago when it popped and drained "pus."  Denies fever/chills.  On exam, she has a small 0.5cm cyst on her left labia that is non-tender and non-fluctuant without erythema, warmth, or drainage. -advised warm compresses several times daily or sitz bath -will cont to monitor -advised that it may need to be drained if conservative measures fail

## 2016-03-17 NOTE — Patient Instructions (Signed)
Thank you for your visit today.   Please return to the internal medicine clinic in about 3 month(s) or sooner if needed.   Please take meloxicam the next time you notice breast pain to see if this helps. You should use a warm compress or a sitz bath for your area on the labia.  Use a warm compress several times daily.   Continue all other medications.   Please be sure to bring all of your medications with you to every visit; this includes herbal supplements, vitamins, eye drops, and any over-the-counter medications.   Should you have any questions regarding your medications and/or any new or worsening symptoms, please be sure to call the clinic at [phone].   If you believe that you are suffering from a life threatening condition or one that may result in the loss of limb or function, then you should call 911 and proceed to the nearest Emergency Department.   A healthy lifestyle and preventative care can promote health and wellness.   Maintain regular health, dental, and eye exams.  Eat a healthy diet. Foods like vegetables, fruits, whole grains, low-fat dairy products, and lean protein foods contain the nutrients you need without too many calories. Decrease your intake of foods high in solid fats, added sugars, and salt. Get information about a proper diet from your caregiver, if necessary.  Regular physical exercise is one of the most important things you can do for your health. Most adults should get at least 150 minutes of moderate-intensity exercise (any activity that increases your heart rate and causes you to sweat) each week. In addition, most adults need muscle-strengthening exercises on 2 or more days a week.   Maintain a healthy weight. The body mass index (BMI) is a screening tool to identify possible weight problems. It provides an estimate of body fat based on height and weight. Your caregiver can help determine your BMI, and can help you achieve or maintain a healthy  weight. For adults 20 years and older:  A BMI below 18.5 is considered underweight.  A BMI of 18.5 to 24.9 is normal.  A BMI of 25 to 29.9 is considered overweight.  A BMI of 30 and above is considered obese.  Bartholin Cyst or Abscess A Bartholin cyst is a fluid-filled sac that forms on a Bartholin gland. Bartholin glands are small glands that are found in the folds of skin (labia) on the sides of the lower opening of the vagina. This type of cyst causes a bulge on the side of the vagina. A cyst that is not large or infected may not cause problems. However, if the fluid in the cyst becomes infected, the cyst can turn into an abscess. An abscess may cause discomfort or pain. HOME CARE  Take medicines only as told by your doctor.  If you were prescribed an antibiotic medicine, finish all of it even if you start to feel better.  Apply warm, wet compresses to the area or take warm, shallow baths that cover your pelvic area (sitz baths). Do this many times each day or as told by your doctor.  Do not squeeze the cyst. Do not apply heavy pressure to it.  Do not have sex until the cyst has gone away.  If your cyst or abscess was opened by your doctor, a small piece of gauze or a drain may have been placed in the area. That lets the cyst drain. Do not remove the gauze or the drain until your doctor  tells you it is okay to do that.  Do not wear tampons. Wear feminine pads as needed for any fluid or blood.  Keep all follow-up visits as told by your doctor. This is important. GET HELP IF:  Your pain, puffiness (swelling), or redness in the area of the cyst gets worse.  You have fluid or pus pus coming from the cyst.  You have a fever.   This information is not intended to replace advice given to you by your health care provider. Make sure you discuss any questions you have with your health care provider.   Document Released: 02/13/2009 Document Revised: 04/03/2015 Document Reviewed:  07/03/2014 Elsevier Interactive Patient Education [employer].

## 2016-03-17 NOTE — Assessment & Plan Note (Signed)
Pt reports pain in the left breast area that has been occuring for the past month.  Denies any trauma, heavy lifting, etc.  She reports the pain is sharp and is located in the  left breast.  She notices it for only a few seconds and then it goes away.  The pain occurs with or without exertion.  No other symptoms.  No lymphadenopathy.  No aggravating or alleviating factors.  Breast exam normal except for some tenderness in the left breast with palpation.  Normal mammogram in July 2016 with no FH of breast cancer.  Unclear etiology.   -cont to monitor -advised to take meloxicam when she experiences the pain again

## 2016-03-18 NOTE — Progress Notes (Signed)
Case discussed with Dr. Gill soon after the resident saw the patient.  We reviewed the resident's history and exam and pertinent patient test results.  I agree with the assessment, diagnosis, and plan of care documented in the resident's note. 

## 2016-05-12 ENCOUNTER — Encounter: Payer: Self-pay | Admitting: *Deleted

## 2016-05-14 ENCOUNTER — Other Ambulatory Visit: Payer: Self-pay | Admitting: Internal Medicine

## 2016-05-14 ENCOUNTER — Other Ambulatory Visit: Payer: Self-pay

## 2016-05-14 ENCOUNTER — Telehealth: Payer: Self-pay | Admitting: Internal Medicine

## 2016-05-14 DIAGNOSIS — N644 Mastodynia: Secondary | ICD-10-CM

## 2016-05-14 DIAGNOSIS — Z0000 Encounter for general adult medical examination without abnormal findings: Secondary | ICD-10-CM

## 2016-05-14 NOTE — Telephone Encounter (Signed)
Rec'd call from the pt.  She has self referred herself to the [facility] for her yearly Mammogram.  Please place an order in Epic for this as the [facility] requires the orders to come from her PCP.  Thanks for you help!

## 2016-05-22 ENCOUNTER — Other Ambulatory Visit: Payer: Self-pay | Admitting: Internal Medicine

## 2016-05-22 DIAGNOSIS — N644 Mastodynia: Secondary | ICD-10-CM

## 2016-06-13 ENCOUNTER — Ambulatory Visit (INDEPENDENT_AMBULATORY_CARE_PROVIDER_SITE_OTHER): Payer: Self-pay | Admitting: Internal Medicine

## 2016-06-13 VITALS — BP 122/80 | HR 79 | Temp 98.2°F | Ht 67.0 in | Wt 239.9 lb

## 2016-06-13 DIAGNOSIS — E039 Hypothyroidism, unspecified: Secondary | ICD-10-CM

## 2016-06-13 DIAGNOSIS — Z79899 Other long term (current) drug therapy: Secondary | ICD-10-CM

## 2016-06-13 DIAGNOSIS — I10 Essential (primary) hypertension: Secondary | ICD-10-CM

## 2016-06-13 DIAGNOSIS — F17210 Nicotine dependence, cigarettes, uncomplicated: Secondary | ICD-10-CM

## 2016-06-13 DIAGNOSIS — Z0000 Encounter for general adult medical examination without abnormal findings: Secondary | ICD-10-CM

## 2016-06-13 DIAGNOSIS — R1011 Right upper quadrant pain: Secondary | ICD-10-CM | POA: Insufficient documentation

## 2016-06-13 MED ORDER — LEVOTHYROXINE SODIUM 100 MCG PO TABS: 100.0000 ug | ORAL_TABLET | Freq: Every day | ORAL | Status: DC

## 2016-06-13 MED ORDER — OLOPATADINE HCL 0.2 % OP SOLN: 1.0000 [drp] | Freq: Every day | OPHTHALMIC | Status: DC | PRN

## 2016-06-13 MED ORDER — OLMESARTAN MEDOXOMIL-HCTZ 20-12.5 MG PO TABS: 1.0000 | ORAL_TABLET | Freq: Every day | ORAL | Status: DC

## 2016-06-13 NOTE — Assessment & Plan Note (Signed)
A/P: - refilled Synthroid

## 2016-06-13 NOTE — Progress Notes (Signed)
Patient ID: Emma Bonilla, female   DOB: 1956-01-21, 60 y.o.   MRN: [id number]   CC: follow up for BP and RUQ pain  HPI:  Emma Bonilla is a 60 y.o. woman who presents today for follow up of her blood pressure and with RUQ pain.  Please see A&P for status of medical conditions addressed today.  Past Medical History  Diagnosis Date  . Hyperlipidemia   . Hypertension   . Arthritis   . Thyroid disease   . Depression   . GERD (gastroesophageal reflux disease)   . TIA (transient ischemic attack)     2010  . Heart murmur     Review of Systems:   Review of Systems  Constitutional: Negative for fever and chills.  Respiratory: Negative for shortness of breath.   Cardiovascular: Negative for chest pain.  Gastrointestinal: Positive for abdominal pain. Negative for nausea and vomiting.  Genitourinary: Negative for dysuria.     Physical Exam:  Filed Vitals:   06/13/16 1003  BP: 122/80  Pulse: 79  Temp: 98.2 F (36.8 C)  TempSrc: Oral  Height: 5\' 7"  (1.702 m)  Weight: 239 lb 14.4 oz (108.818 kg)  SpO2: 98%   Physical Exam  Constitutional: She is oriented to person, place, and time and well-developed, well-nourished, and in no distress.  HENT:  Head: Normocephalic and atraumatic.  Cardiovascular: Normal rate and regular rhythm.   Pulmonary/Chest: Effort normal and breath sounds normal.  Abdominal: Soft.  RUQ is mildly tender to palpation with some increased pain with Murphy's test. No rebound or guarding. No epigastric pain.  No pain along ribcage area.  Neurological: She is alert and oriented to person, place, and time.     Assessment & Plan:   See encounters tab for problem based medical decision making.   Patient discussed with Dr. Angelia Mould   Hypertension BP Readings from Last 3 Encounters:  06/13/16 122/80  03/17/16 120/70  12/24/15 149/70   A: BP is well controlled on metoprolol succinate 12.5mg  daily and olmesartan-HCTZ 20-12.5mg  daily.  P:  -  continue current meds - check electrolytes today  Health care maintenance A: Colonoscopy in 2012 at [facility] in [place] with adenamatous polyp with recommendation for repeat in 5 years.  P: - will f/u with PCP for referral to GI to have this done when [place] is on call since she has [employer].  RUQ pain A: Patient reports RUQ pain for last couple months that will come and go intermittently.  No aggravating or relieving factors.  Patient thought it may be MSK related due to some recent weight gain, but on exam her pain appears more localized to the RUQ vs ribcage area.  She has mild tenderness with some increase in pain with Murphy's test.  She has no fever or chills, nausea or vomiting.  Differential includes symptomatic cholelithiasis vs PUD given her NSAID use, however, she reports infrequent use of her meloxicam and is on PPI for prophylaxis.  She also did not have any epigastric symptoms.  P: - CMET - RUQ ultrasound - lipase  Hypothyroidism A/P: - refilled Synthroid

## 2016-06-13 NOTE — Assessment & Plan Note (Addendum)
A: Patient reports RUQ pain for last couple months that will come and go intermittently.  No aggravating or relieving factors.  Patient thought it may be MSK related due to some recent weight gain, but on exam her pain appears more localized to the RUQ vs ribcage area.  She has mild tenderness with some increase in pain with Murphy's test.  She has no fever or chills, nausea or vomiting.  Differential includes symptomatic cholelithiasis vs PUD given her NSAID use, however, she reports infrequent use of her meloxicam and is on PPI for prophylaxis.  She also did not have any epigastric symptoms.  P: - CMET - RUQ ultrasound - lipase

## 2016-06-13 NOTE — Progress Notes (Signed)
Internal Medicine Clinic Attending  Case discussed with Dr. Wallace at the time of the visit.  We reviewed the resident's history and exam and pertinent patient test results.  I agree with the assessment, diagnosis, and plan of care documented in the resident's note.  

## 2016-06-13 NOTE — Assessment & Plan Note (Signed)
A: Colonoscopy in 2012 at [facility] in [place] with adenamatous polyp with recommendation for repeat in 5 years.  P: - will f/u with PCP for referral to GI to have this done when [place] is on call since she has [employer].

## 2016-06-13 NOTE — Assessment & Plan Note (Signed)
BP Readings from Last 3 Encounters:  06/13/16 122/80  03/17/16 120/70  12/24/15 149/70   A: BP is well controlled on metoprolol succinate 12.5mg  daily and olmesartan-HCTZ 20-12.5mg  daily.  P:  - continue current meds - check electrolytes today

## 2016-06-13 NOTE — Patient Instructions (Signed)
Thank you for coming to see me today. It was a pleasure. Today we talked about:   Blood Pressure: You are doing a great job! I have sent in a refill to your pharmmacy on Benicar-HCTZ.  I have refilled your Synthroid and Eye Drops.  I have ordered some labs for you and will let you know if anything is abnormal.  I have also ordered an ultrasound of your abdomen.   We will try to follow up next month for getting a colonoscopy done.  Please follow-up with [place] in 1 month.  If you have any questions or concerns, please do not hesitate to call the office at [phone].  Take Care,   Jule Ser, DO

## 2016-06-14 LAB — LIPASE: Lipase: 39 U/L (ref 0–59)

## 2016-06-14 LAB — CMP14 + ANION GAP
ALK PHOS: 100 IU/L (ref 39–117)
ALT: 16 IU/L (ref 0–32)
AST: 17 IU/L (ref 0–40)
Albumin/Globulin Ratio: 1.6 (ref 1.2–2.2)
Albumin: 4.2 g/dL (ref 3.6–4.8)
Anion Gap: 12 mmol/L (ref 10.0–18.0)
BILIRUBIN TOTAL: 0.2 mg/dL (ref 0.0–1.2)
BUN/Creatinine Ratio: 12 (ref 12–28)
BUN: 10 mg/dL (ref 8–27)
CO2: 27 mmol/L (ref 18–29)
Calcium: 9.4 mg/dL (ref 8.7–10.3)
Chloride: 102 mmol/L (ref 96–106)
Creatinine, Ser: 0.85 mg/dL (ref 0.57–1.00)
GFR calc Af Amer: 86 mL/min/{1.73_m2} (ref >59)
GFR, EST NON AFRICAN AMERICAN: 75 mL/min/{1.73_m2} (ref >59)
GLOBULIN, TOTAL: 2.7 g/dL (ref 1.5–4.5)
Glucose: 96 mg/dL (ref 65–99)
POTASSIUM: 4.1 mmol/L (ref 3.5–5.2)
Sodium: 141 mmol/L (ref 134–144)
Total Protein: 6.9 g/dL (ref 6.0–8.5)

## 2016-06-16 ENCOUNTER — Encounter: Payer: Self-pay | Admitting: Internal Medicine

## 2016-06-16 ENCOUNTER — Ambulatory Visit (INDEPENDENT_AMBULATORY_CARE_PROVIDER_SITE_OTHER): Payer: Self-pay | Admitting: Internal Medicine

## 2016-06-16 VITALS — BP 118/78 | HR 71 | Ht 66.5 in | Wt 237.8 lb

## 2016-06-16 DIAGNOSIS — Z720 Tobacco use: Secondary | ICD-10-CM

## 2016-06-16 DIAGNOSIS — R002 Palpitations: Secondary | ICD-10-CM | POA: Insufficient documentation

## 2016-06-16 DIAGNOSIS — I350 Nonrheumatic aortic (valve) stenosis: Secondary | ICD-10-CM

## 2016-06-16 NOTE — Progress Notes (Signed)
OFFICE NOTE  Chief Complaint:  Follow-up  Primary Care Physician: Asencion Partridge, MD  HPI:  TORY LINDENMUTH is a pleasant 60 year old female is currently referred to me from the internal medicine clinic for evaluation of palpitations. Ms. Ocegueda has a strong family history of heart disease with both her mother who had congestive heart failure in her father who had died of an MI. She has a personal history of hypertension, dyslipidemia, about a 40-50 pack year smoking history and a known murmur. She presents for evaluation of palpitations. She reports this is been going on for about 2 months and mostly takes place at rest. She notices at night. There are episodes were heart races for several seconds and then goes back to normal. She said she's had 2 or 3 of these episodes in the past month. She does report a prior history of TIA in 2010 and started taking aspirin after that. She has good control of her cholesterol on Crestor. She denies any chest pain or worsening shortness of breath. She's never had presyncope or syncopal symptoms.  Mrs. Bopp returns today for follow-up of echo and monitor. The echocardiogram shows an EF of [id number] with diastolic dysfunction. Additionally there was mild aortic stenosis with a functionally bicuspid valve which is moderately calcified. There was moderate regurgitation. The mean gradient was 13 mmHg. She also had a monitor which demonstrated PACs and PVCs but otherwise sinus rhythm. No A. fib or ventricular arrhythmias were noted. She reports her palpitations have picked up some since she wore the monitor however suspect it's more of the same. She denies any cardiac sounding chest pain or worsening shortness of breath with exertion although I have a high retest probability of coronary disease.  I saw Ms. Conlon back today in follow-up. She has since been started on low-dose beta blocker and is tolerating it well. She says this is significantly help with her  palpitations and she has no more symptoms.  06/16/2016  Mrs. Mccorkle was seen back today in follow-up. She denies any worsening palpitations. EKG shows normal sinus rhythm. Unfortunate she's had close to 10 pound weight gain. She attributed this to stopping smoking, which I commended her on. I have however encourage her to work on some more exercise.  PMHx:  Past Medical History  Diagnosis Date  . Hyperlipidemia   . Hypertension   . Arthritis   . Thyroid disease   . Depression   . GERD (gastroesophageal reflux disease)   . TIA (transient ischemic attack)     2010  . Heart murmur     Past Surgical History  Procedure Laterality Date  . Dilation and curettage of uterus      30 years ago    FAMHx:  Family History  Problem Relation Age of Onset  . Heart disease Mother     CHF  . Hypertension Mother   . Heart disease Father   . Hypertension Father   . Heart disease Sister     LVAD (2014)  . Hypertension Sister   . Diabetes Sister   . Diabetes Brother   . Skin cancer Maternal Grandmother   . Stroke Maternal Grandfather   . Valvular heart disease Sister     leaky valve  . Hypertension Sister   . Hyperlipidemia Sister   . Hypertension Sister   . Hypertension Child   . Hyperlipidemia Child   . Hyperlipidemia Child     SOCHx:   reports that she has been smoking Cigarettes.  She has a 22.5 pack-year smoking history. She does not have any smokeless tobacco history on file. She reports that she uses illicit drugs (Marijuana). Her alcohol history is not on file.  ALLERGIES:  Allergies  Allergen Reactions  . Ibuprofen Rash  . Naproxen Sodium Rash    ROS: A comprehensive review of systems was negative.  HOME MEDS: Current Outpatient Prescriptions  Medication Sig Dispense Refill  . amitriptyline (ELAVIL) 25 MG tablet Take 1 tablet (25 mg total) by mouth at bedtime. 90 tablet 3  . aspirin 81 MG tablet Take 1 tablet (81 mg total) by mouth daily. 30 tablet 3  . Biotin  5000 MCG CAPS Take by mouth.    . calcium-vitamin D (OSCAL WITH D) 500-200 MG-UNIT per tablet Take 1 tablet by mouth daily. 30 tablet 3  . desloratadine (CLARINEX) 5 MG tablet Take 1 tablet (5 mg total) by mouth daily. 90 tablet 3  . Dexlansoprazole 30 MG capsule Take 1 capsule (30 mg total) by mouth daily. 90 capsule 3  . EVENING PRIMROSE OIL PO Take 1 capsule by mouth 2 (two) times daily.    Marland Kitchen levothyroxine (SYNTHROID) 100 MCG tablet Take 1 tablet (100 mcg total) by mouth daily. 90 tablet 1  . Melatonin 10 MG CAPS Take by mouth.    . meloxicam (MOBIC) 15 MG tablet Take 1 tablet (15 mg total) by mouth daily. 30 tablet 3  . metoprolol succinate (TOPROL-XL) 25 MG 24 hr tablet Take 0.5 tablets (12.5 mg total) by mouth daily. 15 tablet 11  . mometasone (NASONEX) 50 MCG/ACT nasal spray Place 2 sprays into the nose daily as needed. 17 g 1  . olmesartan-hydrochlorothiazide (BENICAR HCT) 20-12.5 MG tablet Take 1 tablet by mouth daily. 90 tablet 3  . Olopatadine HCl 0.2 % SOLN Apply 1 drop to eye daily as needed. 1 Bottle 1  . rosuvastatin (CRESTOR) 5 MG tablet Take 1 tablet (5 mg total) by mouth at bedtime. 90 tablet 3  . traMADol (ULTRAM) 50 MG tablet Take 1 tablet (50 mg total) by mouth 2 (two) times daily. 60 tablet 0   No current facility-administered medications for this visit.    LABS/IMAGING: No results found for this or any previous visit (from the past 48 hour(s)). No results found.  WEIGHTS: Wt Readings from Last 3 Encounters:  06/16/16 237 lb 12.8 oz (107.865 kg)  06/13/16 239 lb 14.4 oz (108.818 kg)  03/17/16 238 lb 4.8 oz (108.092 kg)    VITALS: BP 118/78 mmHg  Pulse 71  Ht 5' 6.5" (1.689 m)  Wt 237 lb 12.8 oz (107.865 kg)  BMI 37.81 kg/m2  LMP 02/11/2011  EXAM: General appearance: alert and no distress Lungs: clear to auscultation bilaterally Heart: regular rate and rhythm, systolic murmur: early systolic 2/6, crescendo at 2nd right intercostal space and diastolic  murmur: mid diastolic 2/6, blowing at lower left sternal border Extremities: extremities normal, atraumatic, no cyanosis or edema Neurologic: Grossly normal  EKG: Normal sinus rhythm at 71  ASSESSMENT: 1. Palpitations - PACs and PVCs - resolved with beta blocker 2. 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 Hypertension 4. Dyslipidemia  PLAN: 1.   Mrs. Ouellette has had resolution of her palpitations with beta blocker. She has persistent mild aortic stenosis and probably a functionally bicuspid valve. There is moderate aortic insufficiency. This will need routine follow-up and I'll plan to see her back for this with a repeat echocardiogram in 6-12 months.  Blood pressure is at goal today. We will need to work on weight gain with additional exercise. Follow-up in 6 months or sooner as necessary.  Pixie Casino, MD, [facility] Attending Cardiologist [place] Hilty 06/16/2016, 10:16 AM

## 2016-06-16 NOTE — Patient Instructions (Signed)
Your physician wants you to follow-up in: 6 months with Dr. Hilty. You will receive a reminder letter in the mail two months in advance. If you don't receive a letter, please call our office to schedule the follow-up appointment.    

## 2016-06-20 ENCOUNTER — Ambulatory Visit (HOSPITAL_COMMUNITY)
Admission: RE | Admit: 2016-06-20 | Discharge: 2016-06-20 | Disposition: A | Discharge status: Home / Self Care | Payer: Self-pay | Source: Ambulatory Visit | Attending: Internal Medicine | Admitting: Internal Medicine

## 2016-06-20 DIAGNOSIS — R1011 Right upper quadrant pain: Secondary | ICD-10-CM | POA: Insufficient documentation

## 2016-06-20 DIAGNOSIS — R932 Abnormal findings on diagnostic imaging of liver and biliary tract: Secondary | ICD-10-CM | POA: Insufficient documentation

## 2016-07-11 ENCOUNTER — Other Ambulatory Visit (HOSPITAL_COMMUNITY): Payer: Self-pay | Admitting: *Deleted

## 2016-07-11 DIAGNOSIS — N644 Mastodynia: Secondary | ICD-10-CM

## 2016-07-30 ENCOUNTER — Telehealth (HOSPITAL_COMMUNITY): Payer: Self-pay | Admitting: *Deleted

## 2016-07-30 NOTE — Telephone Encounter (Signed)
Telephoned patient at home number and confirmed [place] appointment for Thursday August 31 1:15

## 2016-07-31 ENCOUNTER — Other Ambulatory Visit: Payer: Self-pay | Admitting: Obstetrics and Gynecology

## 2016-07-31 ENCOUNTER — Ambulatory Visit (HOSPITAL_COMMUNITY)
Admission: RE | Admit: 2016-07-31 | Discharge: 2016-07-31 | Disposition: A | Discharge status: Home / Self Care | Payer: Self-pay | Source: Ambulatory Visit | Attending: Obstetrics and Gynecology | Admitting: Obstetrics and Gynecology

## 2016-07-31 ENCOUNTER — Encounter (HOSPITAL_COMMUNITY): Payer: Self-pay

## 2016-07-31 ENCOUNTER — Ambulatory Visit
Admission: RE | Admit: 2016-07-31 | Discharge: 2016-07-31 | Disposition: A | Discharge status: Home / Self Care | Payer: No Typology Code available for payment source | Source: Ambulatory Visit | Attending: Obstetrics and Gynecology | Admitting: Obstetrics and Gynecology

## 2016-07-31 ENCOUNTER — Ambulatory Visit
Admission: RE | Admit: 2016-07-31 | Discharge: 2016-07-31 | Disposition: A | Discharge status: Home / Self Care | Payer: Self-pay | Source: Ambulatory Visit | Attending: Obstetrics and Gynecology | Admitting: Obstetrics and Gynecology

## 2016-07-31 VITALS — BP 162/84 | Temp 98.4°F | Ht 67.0 in | Wt 236.4 lb

## 2016-07-31 DIAGNOSIS — N644 Mastodynia: Secondary | ICD-10-CM

## 2016-07-31 DIAGNOSIS — Z1239 Encounter for other screening for malignant neoplasm of breast: Secondary | ICD-10-CM

## 2016-07-31 DIAGNOSIS — Z1231 Encounter for screening mammogram for malignant neoplasm of breast: Secondary | ICD-10-CM

## 2016-07-31 NOTE — Patient Instructions (Addendum)
Explained breast self awareness to Emma Bonilla. Patient did not need a Pap smear today due to last Pap smear and HPV typing was 07/16/2015. Let her know [facility] will cover Pap smears and HPV typing every 5 years unless has a history of abnormal Pap smears. Referred patient to the [facility] for a screening mammogram. Appointment scheduled for Thursday, July 31, 2016 at 1350. Let patient know the [facility] will follow up with her within the next couple weeks with results by letter or phone. Discussed smoking cessation with patient. Referred patient to the [facility] Quitline and gave resources to free smoking cessation classes offered at the [facility]. Emma Bonilla verbalized understanding.  Avrianna Smart, Arvil Chaco, RN 9:01 PM

## 2016-07-31 NOTE — Progress Notes (Signed)
Complaints of left breast pain x 6 months that resolved two weeks ago.  Pap Smear:  Pap smear not completed today. Last Pap smear was 07/16/2015 at [facility] and normal with negative HPV. Per patient has a history of an abnormal Pap smear 38 years ago. Patient stated the only follow up she had was a D& C after abnormal Pap smear. Patient stated all Pap smears have been normal since. Last Pap smear result is in EPIC.  Physical exam: Breasts Breasts symmetrical. No skin abnormalities bilateral breasts. No nipple retraction bilateral breasts. No nipple discharge bilateral breasts. No lymphadenopathy. No lumps palpated bilateral breasts. No complaints of pain or tenderness on exam. Referred patient to the [facility] for a screening mammogram. Appointment scheduled for Thursday, July 31, 2016 at 1350.        Pelvic/Bimanual No Pap smear completed today since last Pap smear was 07/16/2015. Pap smear not indicated per BCCCP guidelines.   Smoking History: Patient is a current smoker. Discussed smoking cessation with patient. Referred patient to the [facility] Quitline and gave resources to free smoking cessation classes offered at the [facility].  Patient Navigation: Patient education provided. Access to services provided for patient through [facility] program.   Colorectal Cancer Screening: Per patient had a colonoscopy completed five years ago. No complaints today.

## 2016-08-01 ENCOUNTER — Encounter (HOSPITAL_COMMUNITY): Payer: Self-pay | Admitting: *Deleted

## 2016-08-26 ENCOUNTER — Telehealth: Payer: Self-pay | Admitting: *Deleted

## 2016-08-26 ENCOUNTER — Telehealth: Payer: Self-pay | Admitting: Internal Medicine

## 2016-08-26 MED ORDER — CETIRIZINE HCL 10 MG PO TABS: 10.0000 mg | ORAL_TABLET | Freq: Every day | ORAL | 3 refills | Status: DC

## 2016-08-26 MED ORDER — ATORVASTATIN CALCIUM 10 MG PO TABS: 10.0000 mg | ORAL_TABLET | Freq: Every day | ORAL | 3 refills | Status: DC

## 2016-08-26 NOTE — Telephone Encounter (Signed)
REMINDER CALL, IT IS TIME TO RENEW GCCN CARD, LMTCB °

## 2016-08-26 NOTE — Telephone Encounter (Signed)
States [facility] no longer has Clarinex but has Cetirizine and no longer provide Crestor for free. Can u change her meds and send new rxs to [facility] electronically?  Thanks

## 2016-08-27 ENCOUNTER — Telehealth: Payer: Self-pay | Admitting: Internal Medicine

## 2016-08-27 NOTE — Telephone Encounter (Signed)
RETURNED CALL TO PATIENT, LMTCB

## 2016-09-08 ENCOUNTER — Ambulatory Visit: Payer: No Typology Code available for payment source

## 2016-09-18 ENCOUNTER — Telehealth: Payer: Self-pay | Admitting: Internal Medicine

## 2016-09-18 NOTE — Telephone Encounter (Signed)
AT. REMINDER CALL, NO ANSWER, NO VOICEMAIL °

## 2016-09-19 ENCOUNTER — Ambulatory Visit (INDEPENDENT_AMBULATORY_CARE_PROVIDER_SITE_OTHER): Payer: Self-pay | Admitting: Internal Medicine

## 2016-09-19 ENCOUNTER — Encounter: Payer: Self-pay | Admitting: Internal Medicine

## 2016-09-19 VITALS — BP 138/80 | HR 80 | Temp 98.4°F | Ht 67.0 in | Wt 228.3 lb

## 2016-09-19 DIAGNOSIS — F17210 Nicotine dependence, cigarettes, uncomplicated: Secondary | ICD-10-CM

## 2016-09-19 DIAGNOSIS — Z720 Tobacco use: Secondary | ICD-10-CM

## 2016-09-19 DIAGNOSIS — K0889 Other specified disorders of teeth and supporting structures: Secondary | ICD-10-CM

## 2016-09-19 DIAGNOSIS — Z0000 Encounter for general adult medical examination without abnormal findings: Secondary | ICD-10-CM

## 2016-09-19 DIAGNOSIS — K029 Dental caries, unspecified: Secondary | ICD-10-CM

## 2016-09-19 DIAGNOSIS — F32A Depression, unspecified: Secondary | ICD-10-CM

## 2016-09-19 DIAGNOSIS — F329 Major depressive disorder, single episode, unspecified: Secondary | ICD-10-CM

## 2016-09-19 DIAGNOSIS — M19071 Primary osteoarthritis, right ankle and foot: Secondary | ICD-10-CM

## 2016-09-19 DIAGNOSIS — I10 Essential (primary) hypertension: Secondary | ICD-10-CM

## 2016-09-19 DIAGNOSIS — M1712 Unilateral primary osteoarthritis, left knee: Secondary | ICD-10-CM

## 2016-09-19 DIAGNOSIS — F418 Other specified anxiety disorders: Secondary | ICD-10-CM

## 2016-09-19 DIAGNOSIS — Z79899 Other long term (current) drug therapy: Secondary | ICD-10-CM

## 2016-09-19 DIAGNOSIS — Z23 Encounter for immunization: Secondary | ICD-10-CM

## 2016-09-19 MED ORDER — AMITRIPTYLINE HCL 25 MG PO TABS: 50.0000 mg | ORAL_TABLET | Freq: Every day | ORAL | 3 refills | Status: DC

## 2016-09-19 NOTE — Assessment & Plan Note (Signed)
BP at goal, 138/80 today - Continue current medications

## 2016-09-19 NOTE — Assessment & Plan Note (Signed)
Poor dentition and rotting teeth noted on exam, patient reports they are falling out but not particularly painful. Was seen in free dental clinic previously and they recommended that she have them pulled. Advised patient to return to free dental clinic to have them pulled and to be fitted for dentures.

## 2016-09-19 NOTE — Progress Notes (Addendum)
   CC: Health maintenance visit  HPI:  [name] is a 60 y.o. female with PMHx detailed below presenting for health maintenance visit.  See problem based assessment and plan below for additional details.  Past Medical History:  Diagnosis Date  . Arthritis   . Depression   . GERD (gastroesophageal reflux disease)   . Heart murmur   . Hyperlipidemia   . Hypertension   . Thyroid disease   . TIA (transient ischemic attack)    2010    Review of Systems: Review of Systems  Constitutional: Negative for chills, fever, malaise/fatigue and weight loss.  Respiratory: Negative for cough and shortness of breath.   Cardiovascular: Negative for chest pain, palpitations and orthopnea.  Gastrointestinal: Negative for abdominal pain, constipation, diarrhea, nausea and vomiting.  Skin: Negative for rash.  Neurological: Positive for headaches.  Psychiatric/Behavioral: Positive for depression. Negative for suicidal ideas. The patient is nervous/anxious.   All other systems reviewed and are negative.    Physical Exam: Vitals:   09/19/16 1434  BP: 138/80  Pulse: 80  Temp: 98.4 F (36.9 C)  TempSrc: Oral  SpO2: 98%  Weight: 228 lb 4.8 oz (103.6 kg)  Height: 5\' 7"  (123XX123 m)   Body mass index is 35.76 kg/m. GENERAL- Woman sitting comfortably in exam room chair, alert, in no distress HEENT- Atraumatic, PERRL, EOMI, moist mucous membranes, poor dentition CARDIAC- Regular rate and rhythm, soft systolic murmur, no rubs or gallops. RESP- Clear to ascultation bilaterally, no wheezing or crackles, normal work of breathing ABDOMEN- Obese, normoactive bowel sounds, soft, nontender, nondistended EXTREMITIES- Normal bulk and range of motion, no edema, 2+ peripheral pulses, right ankle mildly tender to palpation SKIN- Warm, dry, intact, without visible rash PSYCH- Tearful affect, clear speech, thoughts linear and goal-directed  Assessment & Plan:   See encounters tab for problem based  medical decision making.  Patient seen with Dr. Beryle Beams

## 2016-09-19 NOTE — Assessment & Plan Note (Signed)
Tearful affect and reports recent difficulty with depression and anxiety lately due to her sister being diagnosed with stage 4 rectal cancer in September. Reports that her other sister was told she had a renal mass in July which was also a scare. She denies SI. She is currently taking the lowest dose of Amitriptyline nightly (25mg ) to help with sleep and headaches and finds it effective.  Plan: - Increase Amitriptyline to 50mg  QHS, will assess and increase as tolerated at next visit (toward 100-300mg /daily) to improve her depression and anxiety

## 2016-09-19 NOTE — Assessment & Plan Note (Signed)
Provided flu vaccine, provided GI referral ([place]) for colonoscopy

## 2016-09-19 NOTE — Assessment & Plan Note (Signed)
Ongoing pain and difficulty from chronic right ankle and left knee pain, reports previous injuries in the distant past and occasional difficulty walking. Interested in seeing physical therapy to improve her strength and mobility.  - Provided PT referral

## 2016-09-19 NOTE — Assessment & Plan Note (Signed)
Reports cutting down to 2 cigarettes daily in the past but began smoking more recently due to stress over her sister's cancer diagnosis. Currently at 5 cigarettes/day and working back down.  Goal is cessation and making some progress. May try Wellbutrin in future.

## 2016-09-19 NOTE — Patient Instructions (Signed)
Please continue to take your medications as prescribed. We have increased your Amitriptyline to 50 mg nightly. Please return in 2-3 months for a check up visit.  Thank you for visiting [place] today!

## 2016-09-22 NOTE — Progress Notes (Signed)
Medicine attending: I personally interviewed and briefly examined this patient on the day of the patient visit and reviewed pertinent clinical ,laboratory, and radiographic data  with resident physician Dr. Asencion Partridge and we discussed a management plan. Blood pressure controlled on current meds.  Flare up of chronic anxiety & depression due to recent dx of stage 4 rectal cancer in one of her sisters. We will increase her amitryptiline.

## 2016-09-25 ENCOUNTER — Other Ambulatory Visit: Payer: Self-pay | Admitting: *Deleted

## 2016-09-25 MED ORDER — MOMETASONE FUROATE 50 MCG/ACT NA SUSP: 2.0000 | Freq: Every day | NASAL | 1 refills | Status: DC | PRN

## 2016-09-26 ENCOUNTER — Encounter: Payer: No Typology Code available for payment source | Admitting: Internal Medicine

## 2016-10-16 ENCOUNTER — Ambulatory Visit: Payer: Self-pay | Attending: Pediatrics | Admitting: Physical Therapy

## 2016-10-16 DIAGNOSIS — M25562 Pain in left knee: Secondary | ICD-10-CM | POA: Insufficient documentation

## 2016-10-16 DIAGNOSIS — M5441 Lumbago with sciatica, right side: Secondary | ICD-10-CM | POA: Insufficient documentation

## 2016-10-16 DIAGNOSIS — R2681 Unsteadiness on feet: Secondary | ICD-10-CM | POA: Insufficient documentation

## 2016-10-16 DIAGNOSIS — R2689 Other abnormalities of gait and mobility: Secondary | ICD-10-CM | POA: Insufficient documentation

## 2016-10-16 DIAGNOSIS — M6281 Muscle weakness (generalized): Secondary | ICD-10-CM | POA: Insufficient documentation

## 2016-10-16 DIAGNOSIS — G8929 Other chronic pain: Secondary | ICD-10-CM | POA: Insufficient documentation

## 2016-10-16 NOTE — Addendum Note (Signed)
Addended by: Dorothea Ogle on: 10/16/2016 03:34 PM   Modules accepted: Orders

## 2016-10-16 NOTE — Therapy (Signed)
[place], [place], [address] Phone: [phone]   Fax:  [phone]  Physical Therapy Evaluation  Patient Details  Name: Emma Bonilla MRN: [id number] Date of Birth: [birth year] Referring Provider: Asencion Partridge MD  Encounter Date: 10/16/2016      PT End of Session - 10/16/16 1046    Visit Number 1   Number of Visits 16   Date for PT Re-Evaluation 12/11/16   Authorization Type GCCN   Authorization Time Period 12/11/16   PT Start Time 1016   PT Stop Time 1100   PT Time Calculation (min) 44 min   Activity Tolerance Patient tolerated treatment well      Past Medical History:  Diagnosis Date  . Arthritis   . Depression   . GERD (gastroesophageal reflux disease)   . Heart murmur   . Hyperlipidemia   . Hypertension   . Thyroid disease   . TIA (transient ischemic attack)    2010    Past Surgical History:  Procedure Laterality Date  . DILATION AND CURETTAGE OF UTERUS     30 years ago    There were no vitals filed for this visit.       Subjective Assessment - 10/16/16 1029    Subjective I took 4 Motrin this morning so I am feeling better now.  a 2/10, but normally 7-8/10,  I had a stroke in January of 2010 with left side weakness   Pertinent History CVA in 2010 Left side weakness   Limitations Standing;Walking;House hold activities   How long can you sit comfortably? unlimited   How long can you stand comfortably? 19min to 15   How long can you walk comfortably? 10 min  not able to exericise   Diagnostic tests xray   Patient Stated Goals Strengthen to be able to exercise and not stumble   Pain Score 8   at best 2/10   Pain Location Knee   Pain Orientation Left   Pain Descriptors / Indicators Throbbing;Aching   Pain Type Chronic pain   Pain Onset More than a month ago   Pain Frequency Intermittent   Aggravating Factors  going up and down steps, transitional movements and walking and  standing   Pain Relieving Factors medication, heat packs            [facility] PT Assessment - 10/16/16 1019      Assessment   Medical Diagnosis left OA of knee   Referring Provider Asencion Partridge MD   Onset Date/Surgical Date --  approximately 2 years but worsened in last 2 weeks   Hand Dominance Right   Prior Therapy PT for knee 1 and 1/2 years ago     Precautions   Precautions Fall   Precaution Comments Pt instructed to use cane to avoid falls     Restrictions   Weight Bearing Restrictions No     Balance Screen   Has the patient fallen in the past 6 months Yes   How many times? 3  losing balance because knee gives out   Has the patient had a decrease in activity level because of a fear of falling?  Yes   Is the patient reluctant to leave their home because of a fear of falling?  Yes     [place] residence   Living Arrangements Spouse/significant other;Children   [place] to enter   Entrance Stairs-Number of Steps 4   Entrance  Stairs-Rails Can reach both   Home Layout One level     Prior Function   Level of Independence Independent     Cognition   Overall Cognitive Status Within Functional Limits for tasks assessed     Observation/Other Assessments   Focus on Therapeutic Outcomes (FOTO)  FOTO 60%, limitation 40% predicted 40% ( goal for 20 %)     Posture/Postural Control   Posture/Postural Control Postural limitations   Postural Limitations Rounded Shoulders;Forward head;Anterior pelvic tilt;Weight shift right     ROM / Strength   AROM / PROM / Strength AROM;Strength     AROM   Right Knee Extension 0   Right Knee Flexion 138   Left Knee Extension 0   Left Knee Flexion 140   Right Ankle Dorsiflexion 8   Left Ankle Dorsiflexion 10     Strength   Right Hip Flexion 4-/5   Right Hip Extension 3+/5   Right Hip ABduction 4-/5   Left Hip Flexion 4-/5   Left Hip Extension 3+/5   Left Hip ABduction 4-/5   Right  Knee Flexion 4/5   Right Knee Extension 4/5   Left Knee Flexion 4-/5   Left Knee Extension 4/5     Transfers   Five time sit to stand comments  20  pt uses momentum to rise from mat instead of motor control     Ambulation/Gait   Ambulation/Gait Yes   Gait Pattern Decreased stance time - left;Decreased stride length;Decreased weight shift to left;Antalgic   Gait Comments Pt reports stumbling and pt owns a cane so was told to start utilizing until she gans strength                   [facility] Adult PT Treatment/Exercise - 10/16/16 1019      Knee/Hip Exercises: Seated   Long Arc Quad 15 reps;Left;1 set   [employer] Limitations use of red t band very fatigues     Knee/Hip Exercises: Supine   Quad Sets 20 reps;Left;Strengthening   Short Arc [employer] 15 reps;Left;Strengthening   Short Arc Quad Sets Limitations Pt needed extra time to perform   Bridges 15 reps;Both;Strengthening   Bridges Limitations Pt unable to perform for whole AROM 1/2 way   Straight Leg Raises Left;15 reps;Strengthening   Straight Leg Raises Limitations very fatigues at 15 reps                PT Education - 10/16/16 1115    Education provided Yes   Education Details POC, Explanation of findings.  Level 1 knee exericises and bridging.     Person(s) Educated Patient   Methods Explanation;Demonstration;Verbal cues;Tactile cues;Handout   Comprehension Verbalized understanding;Returned demonstration          PT Short Term Goals - 10/16/16 1051      PT SHORT TERM GOAL #1   Title "Independent with initial HEP   Time 5   Period Weeks   Status New     PT SHORT TERM GOAL #2   Title Pt will be able to perform sit to stand without UE support at least 12 times in 30 seconds to exhibit better motor control   Time 5   Period Weeks   Status New     PT SHORT TERM GOAL #3   Title "Report pain decrease at rest from  8 /10 to  5 /10.   Time 4   Period Weeks   Status New     PT  SHORT TERM  GOAL #4   Title perform BERG and set LTG as necessary   Time 4   Period Weeks   Status New           PT Long Term Goals - 10/16/16 1053      PT LONG TERM GOAL #1   Title "Pt will be independent with advanced HEP.    Baseline Pt needs revised program   Time 8   Period Weeks   Status New     PT LONG TERM GOAL #2   Title report pain < 3/10 with walking    Time 8   Period Weeks   Status New     PT LONG TERM GOAL #3   Title Pt will be able to ascend/descend stairs with good motor control without exacerbating pain > 3/10   Time 8   Period Weeks   Status New     PT LONG TERM GOAL #4   Title "FOTO will improve from  40%    to 20%    indicating improved functional mobility    Time 8   Period Weeks   Status New     PT LONG TERM GOAL #5   Title tolerate standing for one hour in order to begin consistent walking program   Time 8   Period Weeks   Status New               Plan - 10/16/16 1040    Clinical Impression Statement Pt presents with low complexity evaluation as 60 yo female with chronic knee pain bil but has noticed increasing difficulty ascending/descending steps, standing for more than 10 minutes and difficulty with transition movements.  Pt reports that she has at least 3 stumbles/ falls in past 6 months and she as a cane which she is not presently using.  Pt has an antagic gait and wt bears to the right. Pt reports CVA in 2010 and has residual weakness in Left side.  She has AROM WNL but shows weaknesses in bil LE's with may contribute to her unsteadiness. Pt will benefit from 8 weeks for PT to address strength, gait and balance issue and impairments.    Rehab Potential Good   PT Frequency 2x / week   PT Duration 8 weeks   PT Next Visit Plan  BERG for balance, review basic level 1 exericise   PT Home Exercise Plan Levell 1 exericies knee   Consulted and Agree with Plan of Care Patient      Patient will benefit from skilled therapeutic intervention in  order to improve the following deficits and impairments:  Difficulty walking, Pain, Obesity, Postural dysfunction, Decreased strength, Decreased mobility, Decreased activity tolerance, Decreased balance  Visit Diagnosis: Chronic pain of left knee  Muscle weakness (generalized)  Other abnormalities of gait and mobility  Unsteadiness on feet     Problem List Patient Active Problem List   Diagnosis Date Noted  . TIA (transient ischemic attack) 08/21/2015  . Aortic valve stenosis, mild 06/11/2015  . Dental caries 05/09/2013  . Seasonal allergies 04/03/2012  . Hypothyroidism 03/30/2012  . Osteoarthritis 11/07/2011  . Health care maintenance 09/01/2011  . Hypertension 07/25/2011  . Depression 07/25/2011  . GERD (gastroesophageal reflux disease) 07/25/2011  . Tobacco abuse 07/25/2011  . Hyperlipidemia 07/25/2011    Voncille Lo, PT Exercise Expert for the Aging Adult  10/16/16 11:23 AM Phone: [phone] Fax: [place], [place],  [id number] Phone: [phone]   Fax:  [phone]  Name: SHYKIA KILGO MRN: [id number] Date of Birth: [birth year]

## 2016-10-16 NOTE — Addendum Note (Signed)
Addended by: Dorothea Ogle on: 10/16/2016 03:01 PM   Modules accepted: Orders

## 2016-10-16 NOTE — Patient Instructions (Addendum)
   Copyright  VHI. All rights reserved.  HIP: Flexion / KNEE: Extension, Straight Leg Raise   Raise leg, keeping knee straight. Perform slowly. _15__ reps per set, _2__ sets per day, __7_ days per week   Copyright  VHI. All rights reserved.  Heel Slide  http://gt2.exer.us/372   Copyright  VHI. All rights reserved.     Raise leg until knee is straight. _15__ reps per set, __2_ sets per day, _7__ days per week Use theraband as given in clinic  Copyright  VHI. All rights reserved.  Short Arc [employer] a large can or rolled towel under leg. Straighten knee and leg. Hold _3-5___ seconds. Repeat with other leg. Repeat __15 x 2__ times. Do __1-2__ sessions per day.  http://gt2.exer.us/365   Copyright  VHI. All rights reserved.  Quad Set   Slowly tighten muscles on thigh of straight leg while counting out loud to _5___. Repeat with other leg. Repeat _20___ times. Every time a comercial on TV Do _1-2___ sessions per day.  Bridging    Slowly raise buttocks from floor, keeping stomach tight. Repeat _15___ times per set. Do 2____ sets per session. Do _1-2___ sessions per day.  http://orth.exer.us/1097   Copyright  VHI. All rights reserved.    http://gt2.exer.us/361   Copyright  VHI. All rights reserved.   Voncille Lo, PT Exercise Expert for the Aging Adult  10/16/16 10:41 AM Phone: [phone] Fax: [phone]

## 2016-10-16 NOTE — Addendum Note (Signed)
Addended by: Dorothea Ogle on: 10/16/2016 01:19 PM   Modules accepted: Orders

## 2016-10-22 ENCOUNTER — Telehealth: Payer: Self-pay | Admitting: *Deleted

## 2016-10-22 NOTE — Telephone Encounter (Signed)
SPOKE WITH PATIENT REGARDING HER GI REFERRAL. PATIENT IS AWARE THAT I WILL BE SENDING HER A RECORD RELEASE FORM TO FILL OUT SO WE CAN GET THE INFORMATION FROM [facility]. PATIENT IS TO SEND FORM BACK AND PHONE NUMBER .

## 2016-10-28 ENCOUNTER — Ambulatory Visit: Payer: Self-pay | Admitting: Physical Therapy

## 2016-10-28 DIAGNOSIS — M6281 Muscle weakness (generalized): Secondary | ICD-10-CM

## 2016-10-28 DIAGNOSIS — R2681 Unsteadiness on feet: Secondary | ICD-10-CM

## 2016-10-28 DIAGNOSIS — M25562 Pain in left knee: Principal | ICD-10-CM

## 2016-10-28 DIAGNOSIS — R2689 Other abnormalities of gait and mobility: Secondary | ICD-10-CM

## 2016-10-28 DIAGNOSIS — M5441 Lumbago with sciatica, right side: Secondary | ICD-10-CM

## 2016-10-28 DIAGNOSIS — G8929 Other chronic pain: Secondary | ICD-10-CM

## 2016-10-28 NOTE — Therapy (Signed)
[place], [place], [address] Phone: [phone]   Fax:  [phone]  Physical Therapy Treatment  Patient Details  Name: Emma Bonilla MRN: [id number] Date of Birth: 1955/12/07 Referring Provider: Asencion Partridge MD  Encounter Date: 10/28/2016      PT End of Session - 10/28/16 1421    Visit Number 2   Number of Visits 16   Date for PT Re-Evaluation 12/11/16   Authorization Type GCCN   Authorization Time Period 12/11/16   PT Start Time 0216   PT Stop Time 0254   PT Time Calculation (min) 38 min      Past Medical History:  Diagnosis Date  . Arthritis   . Depression   . GERD (gastroesophageal reflux disease)   . Heart murmur   . Hyperlipidemia   . Hypertension   . Thyroid disease   . TIA (transient ischemic attack)    2010    Past Surgical History:  Procedure Laterality Date  . DILATION AND CURETTAGE OF UTERUS     30 years ago    There were no vitals filed for this visit.      Subjective Assessment - 10/28/16 1422    Subjective I need another copy of my exercises.    Currently in Pain? No/denies                         [facility] Adult PT Treatment/Exercise - 10/28/16 0001      Standardized Balance Assessment   Standardized Balance Assessment Berg Balance Test     Berg Balance Test   Sit to Stand Able to stand without using hands and stabilize independently   Standing Unsupported Able to stand safely 2 minutes   Sitting with Back Unsupported but Feet Supported on Floor or Stool Able to sit safely and securely 2 minutes   Stand to Sit Sits safely with minimal use of hands   Transfers Able to transfer safely, minor use of hands   Standing Unsupported with Eyes Closed Able to stand 10 seconds safely   Standing Ubsupported with Feet Together Able to place feet together independently and stand 1 minute safely   From Standing, Reach Forward with Outstretched Arm Can reach  confidently >25 cm (10")   From Standing Position, Pick up Object from Floor Able to pick up shoe safely and easily   From Standing Position, Turn to Look Behind Over each Shoulder Looks behind from both sides and weight shifts well   Turn 360 Degrees Able to turn 360 degrees safely in 4 seconds or less   Standing Unsupported, Alternately Place Feet on Step/Stool Able to stand independently and safely and complete 8 steps in 20 seconds   Standing Unsupported, One Foot in Front Able to place foot tandem independently and hold 30 seconds   Standing on One Leg Able to lift leg independently and hold > 10 seconds   Total Score 56     Knee/Hip Exercises: Supine   Quad Sets Strengthening;Both;20 reps   Short Arc Quad Sets 2 sets;15 reps;Both   Bridges 15 reps;Both;Strengthening   Bridges with Clams[employer] 2 sets;15 reps  green   Straight Leg Raises Both;2 sets;15 reps                  PT Short Term Goals - 10/28/16 1433      PT SHORT TERM GOAL #1   Title "Independent with initial HEP   Time  5   Period Weeks   Status On-going     PT SHORT TERM GOAL #2   Title Pt will be able to perform sit to stand without UE support at least 12 times in 30 seconds to exhibit better motor control   Time 5   Period Weeks   Status On-going     PT SHORT TERM GOAL #3   Title "Report pain decrease at rest from  8 /10 to  5 /10.   Time 4   Period Weeks   Status On-going     PT SHORT TERM GOAL #4   Title perform BERG and set LTG as necessary   Baseline BERG 56/56   Time 4   Period Weeks   Status Achieved           PT Long Term Goals - 10/16/16 1053      PT LONG TERM GOAL #1   Title "Pt will be independent with advanced HEP.    Baseline Pt needs revised program   Time 8   Period Weeks   Status New     PT LONG TERM GOAL #2   Title report pain < 3/10 with walking    Time 8   Period Weeks   Status New     PT LONG TERM GOAL #3   Title Pt will be able to ascend/descend stairs  with good motor control without exacerbating pain > 3/10   Time 8   Period Weeks   Status New     PT LONG TERM GOAL #4   Title "FOTO will improve from  40%    to 20%    indicating improved functional mobility    Time 8   Period Weeks   Status New     PT LONG TERM GOAL #5   Title tolerate standing for one hour in order to begin consistent walking program   Time 8   Period Weeks   Status New               Plan - 10/28/16 1432    Clinical Impression Statement BERG 56/56. STG# 4 met.  Reprinted and reviewed HEP due to pt loosing her copy. She recalled one of the 5 exercises issued. No increased pain reported.    PT Next Visit Plan Review HEP; progress knee strength as tolerated    PT Home Exercise Plan [place] 1 exericies knee   Consulted and Agree with Plan of Care Patient      Patient will benefit from skilled therapeutic intervention in order to improve the following deficits and impairments:  Difficulty walking, Pain, Obesity, Postural dysfunction, Decreased strength, Decreased mobility, Decreased activity tolerance, Decreased balance  Visit Diagnosis: Chronic pain of left knee  Muscle weakness (generalized)  Other abnormalities of gait and mobility  Unsteadiness on feet  Acute back pain with sciatica, right     Problem List Patient Active Problem List   Diagnosis Date Noted  . TIA (transient ischemic attack) 08/21/2015  . Aortic valve stenosis, mild 06/11/2015  . Dental caries 05/09/2013  . Seasonal allergies 04/03/2012  . Hypothyroidism 03/30/2012  . Osteoarthritis 11/07/2011  . Health care maintenance 09/01/2011  . Hypertension 07/25/2011  . Depression 07/25/2011  . GERD (gastroesophageal reflux disease) 07/25/2011  . Tobacco abuse 07/25/2011  . Hyperlipidemia 07/25/2011    Dorene Ar, PTA 10/28/2016, 2:51 PM  [facility] [address] [place], [place], [address] Phone: [phone]    Fax:  [phone]  Name: Emma Bonilla MRN: [id number] Date of Birth: 1955-12-19

## 2016-10-31 ENCOUNTER — Ambulatory Visit: Payer: Self-pay | Attending: Pediatrics | Admitting: Physical Therapy

## 2016-10-31 DIAGNOSIS — M25562 Pain in left knee: Secondary | ICD-10-CM | POA: Insufficient documentation

## 2016-10-31 DIAGNOSIS — R2681 Unsteadiness on feet: Secondary | ICD-10-CM | POA: Insufficient documentation

## 2016-10-31 DIAGNOSIS — M5441 Lumbago with sciatica, right side: Secondary | ICD-10-CM | POA: Insufficient documentation

## 2016-10-31 DIAGNOSIS — M6281 Muscle weakness (generalized): Secondary | ICD-10-CM | POA: Insufficient documentation

## 2016-10-31 DIAGNOSIS — R2689 Other abnormalities of gait and mobility: Secondary | ICD-10-CM | POA: Insufficient documentation

## 2016-10-31 DIAGNOSIS — G8929 Other chronic pain: Secondary | ICD-10-CM | POA: Insufficient documentation

## 2016-10-31 NOTE — Patient Instructions (Signed)
Abduction: Side Leg Lift (Eccentric) - Side-Lying    Lie on side. Lift top leg slightly higher than shoulder level. Keep top leg straight with body, toes pointing forward. Slowly lower for 3-5 seconds. _10__ reps per set, _2__ sets per day, _7__ days per week.     

## 2016-10-31 NOTE — Therapy (Signed)
[place], [place], [address] Phone: [phone]   Fax:  [phone]  Physical Therapy Treatment  Patient Details  Name: Emma Bonilla MRN: [id number] Date of Birth: 03-12-1956 Referring Provider: Asencion Partridge MD  Encounter Date: 10/31/2016      PT End of Session - 10/31/16 0932    Visit Number 3   Number of Visits 16   Date for PT Re-Evaluation 12/11/16   Authorization Type GCCN   Authorization Time Period 12/11/16   PT Start Time 0930   PT Stop Time 1015   PT Time Calculation (min) 45 min      Past Medical History:  Diagnosis Date  . Arthritis   . Depression   . GERD (gastroesophageal reflux disease)   . Heart murmur   . Hyperlipidemia   . Hypertension   . Thyroid disease   . TIA (transient ischemic attack)    2010    Past Surgical History:  Procedure Laterality Date  . DILATION AND CURETTAGE OF UTERUS     30 years ago    There were no vitals filed for this visit.      Subjective Assessment - 10/31/16 0933    Subjective I have not had any pain for a couple of days, then it starting hurting when I got here and sat down in the lobby,   Currently in Pain? Yes   Pain Score 2    Pain Location Knee   Pain Orientation Left;Medial   Pain Descriptors / Indicators Tightness   Aggravating Factors  steps, transitions, prolonged walking and standing.    Pain Relieving Factors flexed over round pillow                         [facility] Adult PT Treatment/Exercise - 10/31/16 0001      Knee/Hip Exercises: Aerobic   Nustep Nustep L4 LE only      Knee/Hip Exercises: Seated   Long Arc Quad 2 sets;10 reps   Long Arc Quad Limitations red band   Hamstring Curl 2 sets;10 reps   Hamstring Limitations red     Knee/Hip Exercises: Supine   Quad Sets Strengthening;Both;20 reps   Short Arc [employer] 20 reps   Short Arc Quad Sets Limitations 2#   Bridges 2 sets;20 reps   Straight  Leg Raises Both;2 sets;15 reps     Knee/Hip Exercises: Sidelying   Hip ABduction 10 reps   Hip ABduction Limitations both                PT Education - 10/31/16 1012    Education provided Yes   Education Details HEP    Person(s) Educated Patient   Methods Explanation;Handout   Comprehension Verbalized understanding          PT Short Term Goals - 10/28/16 1433      PT SHORT TERM GOAL #1   Title "Independent with initial HEP   Time 5   Period Weeks   Status On-going     PT SHORT TERM GOAL #2   Title Pt will be able to perform sit to stand without UE support at least 12 times in 30 seconds to exhibit better motor control   Time 5   Period Weeks   Status On-going     PT SHORT TERM GOAL #3   Title "Report pain decrease at rest from  8 /10 to  5 /10.   Time 4  Period Weeks   Status On-going     PT SHORT TERM GOAL #4   Title perform BERG and set LTG as necessary   Baseline BERG 56/56   Time 4   Period Weeks   Status Achieved           PT Long Term Goals - 10/16/16 1053      PT LONG TERM GOAL #1   Title "Pt will be independent with advanced HEP.    Baseline Pt needs revised program   Time 8   Period Weeks   Status New     PT LONG TERM GOAL #2   Title report pain < 3/10 with walking    Time 8   Period Weeks   Status New     PT LONG TERM GOAL #3   Title Pt will be able to ascend/descend stairs with good motor control without exacerbating pain > 3/10   Time 8   Period Weeks   Status New     PT LONG TERM GOAL #4   Title "FOTO will improve from  40%    to 20%    indicating improved functional mobility    Time 8   Period Weeks   Status New     PT LONG TERM GOAL #5   Title tolerate standing for one hour in order to begin consistent walking program   Time 8   Period Weeks   Status New               Plan - 10/31/16 0935    Clinical Impression Statement Pt reports perfroming HEP as prescribed and noted decreased pain until this  morning. She had a pop while brushing teeth without increased pain. Started having pain once she arrived at PT this morning.    PT Next Visit Plan Review HEP; progress knee strength as tolerated    PT Home Exercise Plan Levell 1 exericies knee   Consulted and Agree with Plan of Care Patient      Patient will benefit from skilled therapeutic intervention in order to improve the following deficits and impairments:  Difficulty walking, Pain, Obesity, Postural dysfunction, Decreased strength, Decreased mobility, Decreased activity tolerance, Decreased balance  Visit Diagnosis: Chronic pain of left knee  Muscle weakness (generalized)  Other abnormalities of gait and mobility  Unsteadiness on feet  Acute back pain with sciatica, right     Problem List Patient Active Problem List   Diagnosis Date Noted  . TIA (transient ischemic attack) 08/21/2015  . Aortic valve stenosis, mild 06/11/2015  . Dental caries 05/09/2013  . Seasonal allergies 04/03/2012  . Hypothyroidism 03/30/2012  . Osteoarthritis 11/07/2011  . Health care maintenance 09/01/2011  . Hypertension 07/25/2011  . Depression 07/25/2011  . GERD (gastroesophageal reflux disease) 07/25/2011  . Tobacco abuse 07/25/2011  . Hyperlipidemia 07/25/2011    Dorene Ar, PTA 10/31/2016, 10:31 AM  [facility] [address] [place], [place], [address] Phone: [phone]   Fax:  [phone]  Name: Emma Bonilla MRN: [id number] Date of Birth: 1956-04-08

## 2016-11-03 ENCOUNTER — Ambulatory Visit: Payer: Self-pay | Admitting: Physical Therapy

## 2016-11-03 DIAGNOSIS — G8929 Other chronic pain: Secondary | ICD-10-CM

## 2016-11-03 DIAGNOSIS — M6281 Muscle weakness (generalized): Secondary | ICD-10-CM

## 2016-11-03 DIAGNOSIS — M5441 Lumbago with sciatica, right side: Secondary | ICD-10-CM

## 2016-11-03 DIAGNOSIS — R2689 Other abnormalities of gait and mobility: Secondary | ICD-10-CM

## 2016-11-03 DIAGNOSIS — R2681 Unsteadiness on feet: Secondary | ICD-10-CM

## 2016-11-03 DIAGNOSIS — M25562 Pain in left knee: Principal | ICD-10-CM

## 2016-11-03 NOTE — Therapy (Signed)
[place], [place], [address] Phone: [phone]   Fax:  [phone]  Physical Therapy Treatment  Patient Details  Name: Emma Bonilla MRN: [id number] Date of Birth: 12-19-1955 Referring Provider: Asencion Partridge MD  Encounter Date: 11/03/2016      PT End of Session - 11/03/16 0851    Visit Number 4   Number of Visits 16   Date for PT Re-Evaluation 12/11/16   Authorization Type GCCN   Authorization Time Period 12/11/16   PT Start Time 0847   PT Stop Time 0930   PT Time Calculation (min) 43 min      Past Medical History:  Diagnosis Date  . Arthritis   . Depression   . GERD (gastroesophageal reflux disease)   . Heart murmur   . Hyperlipidemia   . Hypertension   . Thyroid disease   . TIA (transient ischemic attack)    2010    Past Surgical History:  Procedure Laterality Date  . DILATION AND CURETTAGE OF UTERUS     30 years ago    There were no vitals filed for this visit.      Subjective Assessment - 11/03/16 0950    Subjective Mild pain at rest. It does not hurt unless I use it.                          [facility] Adult PT Treatment/Exercise - 11/03/16 0001      Ambulation/Gait   Stairs Yes   Stairs Assistance 7: Independent   Stair Management Technique Alternating pattern   Number of Stairs 12   Height of Stairs 6     Knee/Hip Exercises: Aerobic   Nustep Nustep L5 LE only  8 min     Knee/Hip Exercises: Standing   Forward Step Up 15 reps;Hand Hold: 1;Step Height: 6";Left     Knee/Hip Exercises: Seated   Sit to Sand 10 reps;without UE support  in 30 seconds     Knee/Hip Exercises: Supine   Short Arc [employer] 20 reps   Short Arc Quad Sets Limitations 3#    Bridges 15 reps;Both;Strengthening   Bridges with Clamshell 2 sets;15 reps  green   Straight Leg Raises 2 sets;10 reps   Straight Leg Raises Limitations 2#     Knee/Hip Exercises: Sidelying   Clams  green band x 10 each                PT Education - 11/03/16 0949    Education provided Yes   Education Details HEP    Person(s) Educated Patient   Methods Explanation;Handout   Comprehension Verbalized understanding          PT Short Term Goals - 11/03/16 0851      PT SHORT TERM GOAL #1   Title "Independent with initial HEP   Time 5   Period Weeks   Status Achieved     PT SHORT TERM GOAL #2   Title Pt will be able to perform sit to stand without UE support at least 12 times in 30 seconds to exhibit better motor control   Baseline 10 times in 30 sec 12/4   Time 5   Period Weeks   Status On-going     PT SHORT TERM GOAL #3   Title "Report pain decrease at rest from  8 /10 to  5 /10.   Time 4   Period Weeks   Status  Achieved     PT SHORT TERM GOAL #4   Title perform BERG and set LTG as necessary   Baseline BERG 56/56   Time 4   Period Weeks   Status Achieved           PT Long Term Goals - 11/03/16 9494      PT LONG TERM GOAL #1   Title "Pt will be independent with advanced HEP.    Baseline Pt needs revised program   Time 8   Period Weeks   Status On-going     PT LONG TERM GOAL #2   Title report pain < 3/10 with walking    Baseline short distances no pain, unable to assess longer distances    Time 8   Period Weeks   Status On-going     PT LONG TERM GOAL #3   Title Pt will be able to ascend/descend stairs with good motor control without exacerbating pain > 3/10   Time 8   Period Weeks   Status On-going     PT LONG TERM GOAL #4   Title "FOTO will improve from  40%    to 20%    indicating improved functional mobility    Time 8   Period Weeks   Status On-going     PT LONG TERM GOAL #5   Title tolerate standing for one hour in order to begin consistent walking program   Baseline more than an hour in home without increased pain   Time 8   Period Weeks   Status Achieved               Plan - 11/03/16 0906    Clinical Impression  Statement Pt reports no pain since last visit. She is standing in home for ADLs longer than 1 hour without increased pain. She can perfrom 10 sit-stands in 30 seconds. She is independent with initial HEP. STG# 1, #3 Met.    PT Next Visit Plan Review HEP; progress knee strength as tolerated    PT Home Exercise Plan Levell 1 exericies knee, bridge, bridge with clam green band, side clam green band, hip abduction. Sit-stand    Consulted and Agree with Plan of Care Patient      Patient will benefit from skilled therapeutic intervention in order to improve the following deficits and impairments:  Difficulty walking, Pain, Obesity, Postural dysfunction, Decreased strength, Decreased mobility, Decreased activity tolerance, Decreased balance  Visit Diagnosis: Chronic pain of left knee  Muscle weakness (generalized)  Other abnormalities of gait and mobility  Unsteadiness on feet  Acute back pain with sciatica, right     Problem List Patient Active Problem List   Diagnosis Date Noted  . TIA (transient ischemic attack) 08/21/2015  . Aortic valve stenosis, mild 06/11/2015  . Dental caries 05/09/2013  . Seasonal allergies 04/03/2012  . Hypothyroidism 03/30/2012  . Osteoarthritis 11/07/2011  . Health care maintenance 09/01/2011  . Hypertension 07/25/2011  . Depression 07/25/2011  . GERD (gastroesophageal reflux disease) 07/25/2011  . Tobacco abuse 07/25/2011  . Hyperlipidemia 07/25/2011    Dorene Ar, PTA 11/03/2016, 3:08 PM  [facility] [address] [place], [place], [address] Phone: [phone]   Fax:  [phone]  Name: Emma Bonilla MRN: [id number] Date of Birth: [birth year]

## 2016-11-03 NOTE — Patient Instructions (Signed)
Abduction: Clam (Eccentric) - Side-Lying    Lie on side with knees bent. Lift top knee, keeping feet together. Keep trunk steady. Slowly lower for 3-5 seconds. _10__ reps per set, __2_ sets per day, _7__ days per week. ADD GREEN BAND AROUND KNEES    Bridge    Lie back, legs bent. Inhale, pressing hips up. Keeping ribs in, lengthen lower back. Exhale, rolling down along spine from top. Repeat ____ times. Do ____ sessions per day.   ADD GREEN THERA BAND AROUND KNEES. OPEN AND CLOSE KNEES DURING BRIDGE (above)). Repeat 10 times, Do 2 sessions per day.

## 2016-11-06 ENCOUNTER — Ambulatory Visit: Payer: Self-pay | Admitting: Physical Therapy

## 2016-11-10 ENCOUNTER — Ambulatory Visit: Payer: Self-pay | Admitting: Physical Therapy

## 2016-11-13 ENCOUNTER — Ambulatory Visit: Payer: Self-pay | Admitting: Physical Therapy

## 2016-11-13 ENCOUNTER — Telehealth: Payer: Self-pay | Admitting: Physical Therapy

## 2016-11-13 ENCOUNTER — Telehealth: Payer: Self-pay | Admitting: Gastroenterology

## 2016-11-13 NOTE — Telephone Encounter (Signed)
Received colon/path report and placed on Dr. Doyne Keel desk for review. Dr. Havery Moros is Doc of the Day.

## 2016-11-13 NOTE — Telephone Encounter (Signed)
Spoke to Emma Bonilla regarding two no-show appointments this week. She states she has been sick and forgot about her appointment. She plans to attend her next scheduled appointment.

## 2016-11-17 ENCOUNTER — Other Ambulatory Visit: Payer: Self-pay

## 2016-11-17 ENCOUNTER — Ambulatory Visit: Payer: Self-pay | Admitting: Physical Therapy

## 2016-11-17 DIAGNOSIS — R2689 Other abnormalities of gait and mobility: Secondary | ICD-10-CM

## 2016-11-17 DIAGNOSIS — M25562 Pain in left knee: Principal | ICD-10-CM

## 2016-11-17 DIAGNOSIS — M5441 Lumbago with sciatica, right side: Secondary | ICD-10-CM

## 2016-11-17 DIAGNOSIS — G8929 Other chronic pain: Secondary | ICD-10-CM

## 2016-11-17 DIAGNOSIS — R2681 Unsteadiness on feet: Secondary | ICD-10-CM

## 2016-11-17 DIAGNOSIS — M6281 Muscle weakness (generalized): Secondary | ICD-10-CM

## 2016-11-17 MED ORDER — OLOPATADINE HCL 0.2 % OP SOLN: 1.0000 [drp] | Freq: Every day | OPHTHALMIC | 3 refills | Status: DC | PRN

## 2016-11-17 NOTE — Therapy (Addendum)
[place], [place], [address] Phone: [phone]   Fax:  [phone]  Physical Therapy Treatment/Discharge Note  Patient Details  Name: Emma Bonilla MRN: [id number] Date of Birth: [birth year] Referring Provider: Asencion Partridge MD  Encounter Date: 11/17/2016      PT End of Session - 11/17/16 0906    Visit Number 5   Number of Visits 16   Date for PT Re-Evaluation 12/11/16   Authorization Type GCCN   Authorization Time Period 12/11/16   PT Start Time 0847   PT Stop Time 0925   PT Time Calculation (min) 38 min      Past Medical History:  Diagnosis Date  . Arthritis   . Depression   . GERD (gastroesophageal reflux disease)   . Heart murmur   . Hyperlipidemia   . Hypertension   . Thyroid disease   . TIA (transient ischemic attack)    2010    Past Surgical History:  Procedure Laterality Date  . DILATION AND CURETTAGE OF UTERUS     30 years ago    There were no vitals filed for this visit.      Subjective Assessment - 11/17/16 0850    Subjective Knee has been doing good until last night. It started hurting bad.    Currently in Pain? Yes   Pain Score 3    Pain Location Knee   Pain Orientation Left;Medial   Pain Descriptors / Indicators Aching   Aggravating Factors  transitions sometimes, prolonged standing   Pain Relieving Factors exercises            [facility] PT Assessment - 11/17/16 0001      Observation/Other Assessments   Focus on Therapeutic Outcomes (FOTO)  19% limited improved from 40% limited on eval      Strength   Right Hip Flexion 4+/5   Right Hip Extension 4-/5   Right Hip ABduction 4/5   Left Hip Flexion 4+/5   Left Hip Extension 4/5   Left Hip ABduction 4/5   Left Knee Flexion 4+/5   Left Knee Extension 4+/5                     [facility] Adult PT Treatment/Exercise - 11/17/16 0001      Ambulation/Gait   Stairs Assistance 7: Independent   Stair  Management Technique Alternating pattern   Number of Stairs 12   Height of Stairs 6   Gait Comments No pain with stairs, no UE support, good control      Knee/Hip Exercises: Aerobic   Nustep Nustep L5 LE only  5 min     Knee/Hip Exercises: Standing   Lateral Step Up 10 reps;Step Height: 4"     Knee/Hip Exercises: Seated   Long Arc Quad 2 sets;10 reps   Long Arc Quad Limitations green band    Hamstring Curl 2 sets;10 reps   Hamstring Limitations green    Sit to [employer] --  13 reps in 30 seconds without UE      Knee/Hip Exercises: Supine   Quad Sets Strengthening;Both;20 reps   Bridges with Clamshell 2 sets;15 reps  green   Straight Leg Raises 15 reps   Straight Leg Raises Limitations 2#     Knee/Hip Exercises: Sidelying   Hip ABduction 15 reps  2#    Clams green band x 15 each  PT Short Term Goals - 11/17/16 0859      PT SHORT TERM GOAL #1   Title "Independent with initial HEP   Time 5   Period Weeks   Status Achieved     PT SHORT TERM GOAL #2   Title Pt will be able to perform sit to stand without UE support at least 12 times in 30 seconds to exhibit better motor control   Baseline 13 times   Time 5   Period Weeks   Status Achieved     PT SHORT TERM GOAL #3   Title "Report pain decrease at rest from  8 /10 to  5 /10.   Time 4   Period Weeks   Status Achieved     PT SHORT TERM GOAL #4   Title perform BERG and set LTG as necessary   Baseline BERG 56/56   Time 4   Period Weeks   Status Achieved           PT Long Term Goals - 11/17/16 0900      PT LONG TERM GOAL #1   Title "Pt will be independent with advanced HEP.    Baseline --   Time 8   Period Weeks   Status Achieved     PT LONG TERM GOAL #2   Title report pain < 3/10 with walking    Baseline short distances no pain, unable to assess longer distances    Time 8   Period Weeks   Status Partially Met     PT LONG TERM GOAL #3   Title Pt will be able to  ascend/descend stairs with good motor control without exacerbating pain > 3/10   Time 8   Period Weeks   Status Achieved     PT LONG TERM GOAL #4   Title "FOTO will improve from  40%    to 20%    indicating improved functional mobility    Baseline 19% today   Period Weeks   Status Achieved     PT LONG TERM GOAL #5   Title tolerate standing for one hour in order to begin consistent walking program   Baseline more than an hour in home without increased pain   Time 8   Period Weeks   Status Achieved               Plan - 11/17/16 0907    Clinical Impression Statement Pt reports sickness which is why she was absent from PT last week. She has continued her HEP. She is able to navigate 12 steps in clinic without handrail, and no increased pain. LE strength improved. She perfromed 13 sit-stands in 30 seconds. She has met all LTGs and is ready for dischrage.    PT Next Visit Plan discharge today    PT Home Exercise Plan Levell 1 exericies knee, bridge, bridge with clam green band, side clam green band, hip abduction. Sit-stand    Consulted and Agree with Plan of Care Patient      Patient will benefit from skilled therapeutic intervention in order to improve the following deficits and impairments:  Difficulty walking, Pain, Obesity, Postural dysfunction, Decreased strength, Decreased mobility, Decreased activity tolerance, Decreased balance  Visit Diagnosis: Chronic pain of left knee  Muscle weakness (generalized)  Other abnormalities of gait and mobility  Unsteadiness on feet  Acute back pain with sciatica, right     Problem List Patient Active Problem List   Diagnosis Date Noted  . TIA (  transient ischemic attack) 08/21/2015  . Aortic valve stenosis, mild 06/11/2015  . Dental caries 05/09/2013  . Seasonal allergies 04/03/2012  . Hypothyroidism 03/30/2012  . Osteoarthritis 11/07/2011  . Health care maintenance 09/01/2011  . Hypertension 07/25/2011  . Depression  07/25/2011  . GERD (gastroesophageal reflux disease) 07/25/2011  . Tobacco abuse 07/25/2011  . Hyperlipidemia 07/25/2011    Dorene Ar, PTA 11/17/2016, 9:31 AM  [place], [place], [address] Phone: [phone]   Fax:  [phone]  Name: Emma Bonilla MRN: [id number] Date of Birth: September 05, 1956   PHYSICAL THERAPY DISCHARGE SUMMARY  Visits from Start of Care: 5  Current functional level related to goals / functional outcomes: As above   Remaining deficits: As above   Education / Equipment: HEP Plan: Patient agrees to discharge.  Patient goals were not met. Patient is being discharged due to meeting the stated rehab goals.  ?????     Voncille Lo, PT Exercise [facility] Adult  01/15/17 2:51 PM Phone: [phone] Fax: [phone]

## 2016-11-20 ENCOUNTER — Encounter: Payer: Self-pay | Admitting: Gastroenterology

## 2016-11-20 ENCOUNTER — Ambulatory Visit: Payer: Self-pay | Admitting: Physical Therapy

## 2016-11-20 ENCOUNTER — Telehealth: Payer: Self-pay | Admitting: Internal Medicine

## 2016-11-20 NOTE — Telephone Encounter (Signed)
APT. REMINDER CALL, NO ANSWER, NO VOICEMAIL °

## 2016-11-21 ENCOUNTER — Encounter: Payer: Self-pay | Admitting: Internal Medicine

## 2016-11-21 ENCOUNTER — Ambulatory Visit (INDEPENDENT_AMBULATORY_CARE_PROVIDER_SITE_OTHER): Payer: Self-pay | Admitting: Internal Medicine

## 2016-11-21 VITALS — BP 138/86 | HR 75 | Temp 97.9°F | Ht 67.0 in | Wt 229.4 lb

## 2016-11-21 DIAGNOSIS — F329 Major depressive disorder, single episode, unspecified: Secondary | ICD-10-CM

## 2016-11-21 DIAGNOSIS — F17210 Nicotine dependence, cigarettes, uncomplicated: Secondary | ICD-10-CM

## 2016-11-21 DIAGNOSIS — Z79899 Other long term (current) drug therapy: Secondary | ICD-10-CM

## 2016-11-21 DIAGNOSIS — J302 Other seasonal allergic rhinitis: Secondary | ICD-10-CM

## 2016-11-21 DIAGNOSIS — N750 Cyst of Bartholin's gland: Secondary | ICD-10-CM | POA: Insufficient documentation

## 2016-11-21 DIAGNOSIS — K029 Dental caries, unspecified: Secondary | ICD-10-CM

## 2016-11-21 DIAGNOSIS — I10 Essential (primary) hypertension: Secondary | ICD-10-CM

## 2016-11-21 DIAGNOSIS — M1710 Unilateral primary osteoarthritis, unspecified knee: Secondary | ICD-10-CM

## 2016-11-21 DIAGNOSIS — M1712 Unilateral primary osteoarthritis, left knee: Secondary | ICD-10-CM

## 2016-11-21 DIAGNOSIS — F32A Depression, unspecified: Secondary | ICD-10-CM

## 2016-11-21 DIAGNOSIS — Z720 Tobacco use: Secondary | ICD-10-CM

## 2016-11-21 DIAGNOSIS — K0889 Other specified disorders of teeth and supporting structures: Secondary | ICD-10-CM

## 2016-11-21 MED ORDER — OLOPATADINE HCL 0.2 % OP SOLN: 1.0000 [drp] | Freq: Every day | OPHTHALMIC | 3 refills | Status: DC | PRN

## 2016-11-21 MED ORDER — MOMETASONE FUROATE 50 MCG/ACT NA SUSP: 2.0000 | Freq: Every day | NASAL | 1 refills | Status: DC | PRN

## 2016-11-21 NOTE — Patient Instructions (Signed)
Please continue to take your medications as prescribed.  Try your best to quit smoking.  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   Please return for follow up in 3 months.

## 2016-11-21 NOTE — Progress Notes (Signed)
   CC: Follow up visit, persistent "boil" in groin  HPI:  [name] is a 60 y.o. female with PMHx detailed below presenting for follow up of multiple medical problems including depression, HTN, osteoarthritis, tobacco abuse, and also with new concerns about a "boil" that developed in her groin and has been intermittently swelling and draining since Thanksgiving.   See problem based assessment and plan below for additional details.  Past Medical History:  Diagnosis Date  . Arthritis   . Depression   . GERD (gastroesophageal reflux disease)   . Heart murmur   . Hyperlipidemia   . Hypertension   . Thyroid disease   . TIA (transient ischemic attack)    2010    Review of Systems: Review of Systems  Constitutional: Negative for chills, fever and malaise/fatigue.  HENT: Positive for congestion.   Respiratory: Negative for sputum production and shortness of breath.   Cardiovascular: Negative for chest pain and palpitations.  Gastrointestinal: Negative for abdominal pain, diarrhea, nausea and vomiting.  Genitourinary: Negative for dysuria, frequency and urgency.  Skin: Negative for rash.  All other systems reviewed and are negative.   Physical Exam: Vitals:   11/21/16 1401  BP: 138/86  Pulse: 75  Temp: 97.9 F (36.6 C)  TempSrc: Oral  SpO2: 99%  Weight: 229 lb 6.4 oz (104.1 kg)  Height: 5\' 7"  (1.702 m)   Body mass index is 35.93 kg/m. GENERAL- Obese woman sitting comfortably in exam room chair, alert, in no distress HEENT- Atraumatic, PERRL, moist mucous membranes, poor dentition CARDIAC- Regular rate and rhythm, no murmurs, rubs or gallops. RESP- Clear to ascultation bilaterally, no wheezing or crackles, normal work of breathing ABDOMEN- Obese, soft, nontender, nondistended GENITOURINARY- external exam with RN chaperone, palpable fullness along left lower labia with small patch erythema at the superior aspect, nontender to palpation, no drainage EXTREMITIES-  Normal bulk and range of motion, no edema, 2+ peripheral pulses SKIN- Warm, dry, intact, without visible rash PSYCH- Positive affect, clear speech, thoughts linear and goal-directed   Assessment & Plan:   See encounters tab for problem based medical decision making.  Patient seen with Dr. Evette Doffing

## 2016-11-24 NOTE — Assessment & Plan Note (Signed)
Reports significant improvement in her knee pain and mobility with physical therapy, although reports that they would not address her ankle pain as it was not mentioned in the referral.  Plan: - Encourage continuation of PT stretching and exercises - Advised to use Tylenol and/or Ibuprofen PRN

## 2016-11-24 NOTE — Assessment & Plan Note (Signed)
Reports her depressive symptoms have improved with time and with the increased dose of Amitriptyline.  Plan: - Continue Amitriptyline 50mg  [facility]

## 2016-11-24 NOTE — Assessment & Plan Note (Addendum)
Reports that she developed a "boil" in her groin that became painful and increasingly swollen around Thanksgiving then drained clear fluid with resolution of her discomfort. She reports it the swelling/pain/drainage has recurred 2-3x since then. She has tried warm compressed with minimal symptom relief. She denies any fevers, chills, or urinary symptoms. 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   Plan: - Continue to monitor for abscess formation - if symptoms continue to recur refer to gynecology for I&D and potential Word catheter placement

## 2016-11-24 NOTE — Assessment & Plan Note (Signed)
Provided refill on Nasonex and Olopatadine eye drops

## 2016-11-24 NOTE — Assessment & Plan Note (Signed)
Poor dentition again noted on exam - has not returned to dentist. Provided info on [facility] clinic

## 2016-11-24 NOTE — Assessment & Plan Note (Signed)
BP 138/86 today, at goal  - Continue Olmesartan-HCTZ 20-12.5 mg daily - Continue Metoprolol succinate 12.5 mg daily

## 2016-11-24 NOTE — Assessment & Plan Note (Signed)
Down to 1-3 cigarettes daily, goal is cessation but not ready to quit at the moment.

## 2016-11-26 NOTE — Progress Notes (Signed)
Internal Medicine Clinic Attending  I saw and evaluated the patient.  I personally confirmed the key portions of the history and exam documented by Dr. Johnson and I reviewed pertinent patient test results.  The assessment, diagnosis, and plan were formulated together and I agree with the documentation in the resident's note.  

## 2016-11-27 ENCOUNTER — Ambulatory Visit: Payer: Self-pay | Admitting: Physical Therapy

## 2016-11-28 ENCOUNTER — Ambulatory Visit: Payer: Self-pay | Admitting: Physical Therapy

## 2016-12-15 NOTE — Telephone Encounter (Signed)
Dr. Havery Moros reviewed records and has accepted patient. Appointment scheduled.

## 2017-01-06 ENCOUNTER — Other Ambulatory Visit: Payer: Self-pay | Admitting: Internal Medicine

## 2017-01-12 ENCOUNTER — Ambulatory Visit (AMBULATORY_SURGERY_CENTER): Payer: Self-pay

## 2017-01-12 VITALS — Ht 67.0 in | Wt 228.0 lb

## 2017-01-12 DIAGNOSIS — Z860100 Personal history of colon polyps, unspecified: Secondary | ICD-10-CM

## 2017-01-12 DIAGNOSIS — I350 Nonrheumatic aortic (valve) stenosis: Secondary | ICD-10-CM

## 2017-01-12 MED ORDER — SUPREP BOWEL PREP KIT 17.5-3.13-1.6 GM/177ML PO SOLN: 1.0000 | Freq: Once | ORAL | 0 refills | Status: AC

## 2017-01-12 NOTE — Progress Notes (Signed)
No allergies to eggs or soy No past problems with anesthesia No home oxygen No diet meds  Declined emmi 

## 2017-01-14 ENCOUNTER — Telehealth: Payer: Self-pay

## 2017-01-14 NOTE — Telephone Encounter (Signed)
Erroneous encounter

## 2017-01-19 ENCOUNTER — Ambulatory Visit (INDEPENDENT_AMBULATORY_CARE_PROVIDER_SITE_OTHER): Payer: No Typology Code available for payment source | Admitting: Internal Medicine

## 2017-01-19 ENCOUNTER — Telehealth: Payer: Self-pay | Admitting: Gastroenterology

## 2017-01-19 ENCOUNTER — Encounter: Payer: Self-pay | Admitting: Internal Medicine

## 2017-01-19 VITALS — BP 134/78 | HR 87 | Ht 67.0 in | Wt 228.0 lb

## 2017-01-19 DIAGNOSIS — I10 Essential (primary) hypertension: Secondary | ICD-10-CM

## 2017-01-19 DIAGNOSIS — I350 Nonrheumatic aortic (valve) stenosis: Secondary | ICD-10-CM

## 2017-01-19 DIAGNOSIS — Z01810 Encounter for preprocedural cardiovascular examination: Secondary | ICD-10-CM

## 2017-01-19 NOTE — Progress Notes (Signed)
OFFICE NOTE  Chief Complaint:  Follow-up  Primary Care Physician: Asencion Partridge, MD  HPI:  Emma Bonilla is a pleasant 61 year old female is currently referred to me from the internal medicine clinic for evaluation of palpitations. Emma Bonilla has a strong family history of heart disease with both her mother who had congestive heart failure in her father who had died of an MI. She has a personal history of hypertension, dyslipidemia, about a 40-50 pack year smoking history and a known murmur. She presents for evaluation of palpitations. She reports this is been going on for about 2 months and mostly takes place at rest. She notices at night. There are episodes were heart races for several seconds and then goes back to normal. She said she's had 2 or 3 of these episodes in the past month. She does report a prior history of TIA in 2010 and started taking aspirin after that. She has good control of her cholesterol on Crestor. She denies any chest pain or worsening shortness of breath. She's never had presyncope or syncopal symptoms.  Emma Bonilla returns today for follow-up of echo and monitor. The echocardiogram shows an EF of [id number] with diastolic dysfunction. Additionally there was mild aortic stenosis with a functionally bicuspid valve which is moderately calcified. There was moderate regurgitation. The mean gradient was 13 mmHg. She also had a monitor which demonstrated PACs and PVCs but otherwise sinus rhythm. No A. fib or ventricular arrhythmias were noted. She reports her palpitations have picked up some since she wore the monitor however suspect it's more of the same. She denies any cardiac sounding chest pain or worsening shortness of breath with exertion although I have a high retest probability of coronary disease.  I saw Emma Bonilla back today in follow-up. She has since been started on low-dose beta blocker and is tolerating it well. She says this is significantly help with her  palpitations and she has no more symptoms.  06/16/2016  Emma Bonilla was seen back today in follow-up. She denies any worsening palpitations. EKG shows normal sinus rhythm. Unfortunate she's had close to 10 pound weight gain. She attributed this to stopping smoking, which I commended her on. I have however encourage her to work on some more exercise.  01/19/2017  Emma Bonilla was seen today in follow-up. She recently got the news that her sister has stage IV colon cancer. Based on these findings she is concerned that she may also have colon cancer and has scheduled an upcoming colonoscopy on February 26. Given her establish cardiac history, preoperative risk assessment was recommended. She last had an echo more than a year ago which showed mild aortic stenosis and a functionally bicuspid aortic valve with moderate aortic insufficiency. LVEF is 65-70%. She also had hypertension which was not adequately controlled however blood pressure today is improved at 134/78. She denies any chest pain or worsening shortness of breath.  PMHx:  Past Medical History:  Diagnosis Date  . Arthritis   . Depression   . GERD (gastroesophageal reflux disease)   . Heart murmur   . Hyperlipidemia   . Hypertension   . Thyroid disease   . TIA (transient ischemic attack)    2010    Past Surgical History:  Procedure Laterality Date  . DILATION AND CURETTAGE OF UTERUS     30 years ago    FAMHx:  Family History  Problem Relation Age of Onset  . Heart disease Mother     CHF  .  Hypertension Mother   . Emphysema Mother   . Cancer Mother     lung  . Heart disease Father   . Hypertension Father   . Crohn's disease Father   . Heart disease Sister     LVAD (2014)  . Hypertension Sister   . Diabetes Sister   . Colon cancer Sister     32  . Skin cancer Maternal Grandmother   . Stroke Maternal Grandfather   . Diabetes Brother   . Valvular heart disease Sister     leaky valve  . Hypertension Sister   .  Hyperlipidemia Sister   . Hypertension Sister   . Hypertension Child   . Hyperlipidemia Child   . Hyperlipidemia Child     SOCHx:   reports that she has been smoking Cigarettes.  She has a 22.50 pack-year smoking history. She has never used smokeless tobacco. She reports that she drinks alcohol. She reports that she uses drugs, including Marijuana, about 1 time per week.  ALLERGIES:  Allergies  Allergen Reactions  . Ibuprofen Rash  . Naproxen Sodium Rash    ROS: A comprehensive review of systems was negative.  HOME MEDS: Current Outpatient Prescriptions  Medication Sig Dispense Refill  . amitriptyline (ELAVIL) 25 MG tablet Take 2 tablets (50 mg total) by mouth at bedtime. 90 tablet 3  . aspirin 81 MG tablet Take 1 tablet (81 mg total) by mouth daily. 30 tablet 3  . atorvastatin (LIPITOR) 10 MG tablet Take 1 tablet (10 mg total) by mouth daily. 60 tablet 3  . Biotin 5000 MCG CAPS Take by mouth.    . calcium-vitamin D (OSCAL WITH D) 500-200 MG-UNIT per tablet Take 1 tablet by mouth daily. 30 tablet 3  . cetirizine (ZYRTEC) 10 MG tablet Take 1 tablet (10 mg total) by mouth daily. 60 tablet 3  . Dexlansoprazole 30 MG capsule Take 1 capsule (30 mg total) by mouth daily. 90 capsule 3  . levothyroxine (SYNTHROID) 100 MCG tablet Take 1 tablet (100 mcg total) by mouth daily. 90 tablet 1  . Melatonin 10 MG CAPS Take by mouth.    . meloxicam (MOBIC) 15 MG tablet Take 1 tablet (15 mg total) by mouth daily. (Patient taking differently: Take 15 mg by mouth as needed. ) 30 tablet 3  . metoprolol succinate (TOPROL-XL) 25 MG 24 hr tablet TAKE ONE-HALF TABLET BY MOUTH ONCE DAILY 15 tablet 11  . mometasone (NASONEX) 50 MCG/ACT nasal spray Place 2 sprays into the nose daily as needed. 17 g 1  . olmesartan-hydrochlorothiazide (BENICAR HCT) 20-12.5 MG tablet Take 1 tablet by mouth daily. 90 tablet 3  . Olopatadine HCl 0.2 % SOLN Apply 1 drop to eye daily as needed. 1 Bottle 3   No current  facility-administered medications for this visit.     LABS/IMAGING: No results found for this or any previous visit (from the past 48 hour(s)). No results found.  WEIGHTS: Wt Readings from Last 3 Encounters:  01/19/17 228 lb (103.4 kg)  01/12/17 228 lb (103.4 kg)  11/21/16 229 lb 6.4 oz (104.1 kg)    VITALS: BP 134/78   Pulse 87   Ht 5\' 7"  (1.702 m)   Wt 228 lb (103.4 kg)   LMP 02/11/2011   BMI 35.71 kg/m   EXAM: General appearance: alert and no distress Lungs: clear to auscultation bilaterally Heart: regular rate and rhythm, systolic murmur: early systolic 2/6, crescendo at 2nd right intercostal space and diastolic murmur: mid diastolic 2/6, blowing  at lower left sternal border Extremities: extremities normal, atraumatic, no cyanosis or edema Neurologic: Grossly normal  EKG: Normal sinus rhythm at 87  ASSESSMENT: 1. Likely low risk for upcoming colonoscopy 2. Palpitations - PACs and PVCs - resolved with beta blocker 3. Mild aortic stenosis with a functionally bicuspid valve, moderate aortic insufficiency, EF 65-70% 4. Hypertension 5. Dyslipidemia  PLAN: 1.   Emma Bonilla is likely low risk for upcoming colonoscopy. She is due for repeat echocardiogram and exam does not indicate any significant change in physical findings. I suspect her valve is stable. Blood pressure is better controlled today. We'll repeat an echocardiogram over this week and she should be okay to have her colonoscopy next week.  Pixie Casino, MD, [facility] Attending Cardiologist [place] C Adline Kirshenbaum 01/19/2017, 6:25 PM

## 2017-01-19 NOTE — Patient Instructions (Signed)
Your physician has requested that you have an echocardiogram @ [address] - [address]. Echocardiography is a painless test that uses sound waves to create images of your heart. It provides your doctor with information about the size and shape of your heart and how well your heart's chambers and valves are working. This procedure takes approximately one hour. There are no restrictions for this procedure.  Your physician wants you to follow-up in ONE YEAR with Dr. Hilty.  You will receive a reminder letter in the mail two months in advance. If you don't receive a letter, please call our office to schedule the follow-up appointment.   

## 2017-01-20 NOTE — Telephone Encounter (Signed)
Patient was called on 01/20/17 in response to her phone message. Patient states that she can not afford a prep for a colonoscopy. Patient was given a sample of Movi Prep because that was the only prep that was available. Patient was notified and will come and pick up the prep on 01/21/17. We will review the instructions when patient comes to pick up the prep.   Riki Sheer, LPN

## 2017-01-23 ENCOUNTER — Telehealth: Payer: Self-pay

## 2017-01-23 NOTE — Telephone Encounter (Signed)
Lm on vm that I would leave a Suprep sample up front to be picked up 

## 2017-01-26 ENCOUNTER — Encounter: Payer: Self-pay | Admitting: Gastroenterology

## 2017-01-26 ENCOUNTER — Ambulatory Visit (AMBULATORY_SURGERY_CENTER): Payer: Self-pay | Admitting: Gastroenterology

## 2017-01-26 ENCOUNTER — Telehealth: Payer: Self-pay

## 2017-01-26 VITALS — BP 126/80 | HR 73 | Temp 98.0°F | Resp 16 | Ht 67.0 in | Wt 228.0 lb

## 2017-01-26 DIAGNOSIS — Z800 Family history of malignant neoplasm of digestive organs: Secondary | ICD-10-CM

## 2017-01-26 DIAGNOSIS — D128 Benign neoplasm of rectum: Secondary | ICD-10-CM

## 2017-01-26 DIAGNOSIS — D123 Benign neoplasm of transverse colon: Secondary | ICD-10-CM

## 2017-01-26 DIAGNOSIS — D120 Benign neoplasm of cecum: Secondary | ICD-10-CM

## 2017-01-26 DIAGNOSIS — Z86010 Personal history of colonic polyps: Secondary | ICD-10-CM

## 2017-01-26 MED ORDER — SODIUM CHLORIDE 0.9 % IV SOLN: 500.0000 mL | INTRAVENOUS | Status: DC

## 2017-01-26 NOTE — Progress Notes (Signed)
Called to room to assist during endoscopic procedure.  Patient ID and intended procedure confirmed with present staff. Received instructions for my participation in the procedure from the performing physician.  

## 2017-01-26 NOTE — Telephone Encounter (Signed)
Pt scheduled 03/03/17 at 4pm

## 2017-01-26 NOTE — Progress Notes (Signed)
To PACU, vss patent aw report to rn 

## 2017-01-26 NOTE — Op Note (Signed)
[facility] Patient Name: Emma Bonilla Procedure Date: 01/26/2017 11:00 AM MRN: [id number] Endoscopist: Remo Lipps P. Armbruster MD, MD Age: 60 Referring MD:  Date of Birth: May 27, 1956 Gender: Female Account #: [id number] Procedure:                Colonoscopy Indications:              Surveillance: Personal history of adenomatous                            polyps on last colonoscopy 5 years ago, family                            history of colon cancer in sister (diagnosed age                            mid 45s per patient) Medicines:                Monitored Anesthesia Care Procedure:                Pre-Anesthesia Assessment:                           - Prior to the procedure, a History and Physical                            was performed, and patient medications and                            allergies were reviewed. The patient's tolerance of                            previous anesthesia was also reviewed. The risks                            and benefits of the procedure and the sedation                            options and risks were discussed with the patient.                            All questions were answered, and informed consent                            was obtained. Prior Anticoagulants: The patient has                            taken aspirin, last dose was 1 day prior to                            procedure. ASA Grade Assessment: III - A patient                            with severe systemic disease. After reviewing the  risks and benefits, the patient was deemed in                            satisfactory condition to undergo the procedure.                           After obtaining informed consent, the colonoscope                            was passed under direct vision. Throughout the                            procedure, the patient's blood pressure, pulse, and                            oxygen saturations were monitored  continuously. The                            Model CF-HQ190L [id number]) scope was introduced                            through the anus and advanced to the the cecum,                            identified by appendiceal orifice and ileocecal                            valve. The colonoscopy was performed without                            difficulty. The patient tolerated the procedure                            well. The quality of the bowel preparation was                            adequate. The ileocecal valve, appendiceal orifice,                            and rectum were photographed. Scope In: 11:04:11 AM Scope Out: 11:22:41 AM Scope Withdrawal Time: 0 hours 15 minutes 11 seconds  Total Procedure Duration: 0 hours 18 minutes 30 seconds  Findings:                 The perianal exam findings include non-thrombosed                            external hemorrhoids.                           A 4 mm polyp was found in the cecum. The polyp was                            sessile. The polyp was removed with a cold snare.  Resection and retrieval were complete.                           A 4 mm polyp was found in the transverse colon. The                            polyp was sessile. The polyp was removed with a                            cold snare. Resection and retrieval were complete.                           A diminutive polyp was found in the splenic                            flexure. The polyp was sessile. The polyp was                            removed with a cold snare. Resection and retrieval                            were complete.                           Three flat polyps were found in the rectum. The                            polyps were 3 to 5 mm in size. These polyps were                            removed with a cold snare. Resection and retrieval                            were complete.                           Internal hemorrhoids were  found during retroflexion.                           The exam was otherwise without abnormality. Complications:            No immediate complications. Estimated blood loss:                            Minimal. Estimated Blood Loss:     Estimated blood loss was minimal. Impression:               - Non-thrombosed external hemorrhoids found on                            perianal exam.                           - One 4 mm polyp in the cecum, removed with a cold  snare. Resected and retrieved.                           - One 4 mm polyp in the transverse colon, removed                            with a cold snare. Resected and retrieved.                           - One diminutive polyp at the splenic flexure,                            removed with a cold snare. Resected and retrieved.                           - Three 3 to 5 mm polyps in the rectum, removed                            with a cold snare. Resected and retrieved.                           - Internal hemorrhoids.                           - The examination was otherwise normal. Recommendation:           - Patient has a contact number available for                            emergencies. The signs and symptoms of potential                            delayed complications were discussed with the                            patient. Return to normal activities tomorrow.                            Written discharge instructions were provided to the                            patient.                           - Resume previous diet.                           - Continue present medications.                           - No ibuprofen, naproxen, or other non-steroidal                            anti-inflammatory drugs for 2 weeks after polyp  removal.                           - Await pathology results.                           - Repeat colonoscopy is recommended for                             surveillance. The colonoscopy date will be                            determined after pathology results from today's                            exam become available for review. Remo Lipps P. Armbruster MD, MD 01/26/2017 11:27:00 AM This report has been signed electronically.

## 2017-01-26 NOTE — Patient Instructions (Signed)
YOU HAD AN ENDOSCOPIC PROCEDURE TODAY AT [facility]:   Refer to the procedure report that was given to you for any specific questions about what was found during the examination.  If the procedure report does not answer your questions, please call your gastroenterologist to clarify.  If you requested that your care partner not be given the details of your procedure findings, then the procedure report has been included in a sealed envelope for you to review at your convenience later.  YOU SHOULD EXPECT: Some feelings of bloating in the abdomen. Passage of more gas than usual.  Walking can help get rid of the air that was put into your GI tract during the procedure and reduce the bloating. If you had a lower endoscopy (such as a colonoscopy or flexible sigmoidoscopy) you may notice spotting of blood in your stool or on the toilet paper. If you underwent a bowel prep for your procedure, you may not have a normal bowel movement for a few days.  Please Note:  You might notice some irritation and congestion in your nose or some drainage.  This is from the oxygen used during your procedure.  There is no need for concern and it should clear up in a day or so.  SYMPTOMS TO REPORT IMMEDIATELY:   Following lower endoscopy (colonoscopy or flexible sigmoidoscopy):  Excessive amounts of blood in the stool  Significant tenderness or worsening of abdominal pains  Swelling of the abdomen that is new, acute  Fever of 100F or higher    For urgent or emergent issues, a gastroenterologist can be reached at any hour by calling [phone].   DIET:  We do recommend a small meal at first, but then you may proceed to your regular diet.  Drink plenty of fluids but you should avoid alcoholic beverages for 24 hours.  ACTIVITY:  You should plan to take it easy for the rest of today and you should NOT DRIVE or use heavy machinery until tomorrow (because of the sedation medicines used during the test).     FOLLOW UP: Our staff will call the number listed on your records the next business day following your procedure to check on you and address any questions or concerns that you may have regarding the information given to you following your procedure. If we do not reach you, we will leave a message.  However, if you are feeling well and you are not experiencing any problems, there is no need to return our call.  We will assume that you have returned to your regular daily activities without incident.  If any biopsies were taken you will be contacted by phone or by letter within the next 1-3 weeks.  Please call us at [phone] if you have not heard about the biopsies in 3 weeks.    SIGNATURES/CONFIDENTIALITY: You and/or your care partner have signed paperwork which will be entered into your electronic medical record.  These signatures attest to the fact that that the information above on your After Visit Summary has been reviewed and is understood.  Full responsibility of the confidentiality of this discharge information lies with you and/or your care-partner.   No ibuprofen,naproxen,or non-steroidal anti-inflammatory drugs  For 2 weeks,resume remainder of medications. Information given on polyps,hemorrhoids and high fiber diet.

## 2017-01-26 NOTE — Telephone Encounter (Signed)
-----   Message from Manus Gunning, MD sent at 01/26/2017 12:15 PM EST ----- Regarding: hemorrhoid banding Hi Caryl Pina, This patient had a colonoscopy today and wanted to have a hemorrhoid banding done. Can you please help schedule her and put her in a banding slot? Thanks

## 2017-01-27 ENCOUNTER — Telehealth: Payer: Self-pay | Admitting: *Deleted

## 2017-01-27 NOTE — Telephone Encounter (Signed)
  Follow up Call-  Call back number 01/26/2017  Post procedure Call Back phone  # [phone]  Permission to leave phone message Yes  Some recent data might be hidden     Patient questions:  Do you have a fever, pain , or abdominal swelling? No. Pain Score  0 *  Have you tolerated food without any problems? Yes.    Have you been able to return to your normal activities? Yes.    Do you have any questions about your discharge instructions: Diet   No. Medications  No. Follow up visit  No.  Do you have questions or concerns about your Care? No.  Actions: * If pain score is 4 or above: No action needed, pain <4.

## 2017-01-30 ENCOUNTER — Encounter: Payer: Self-pay | Admitting: Gastroenterology

## 2017-02-05 ENCOUNTER — Ambulatory Visit (HOSPITAL_COMMUNITY): Payer: No Typology Code available for payment source | Attending: Internal Medicine

## 2017-02-05 ENCOUNTER — Other Ambulatory Visit: Payer: Self-pay

## 2017-02-05 DIAGNOSIS — I10 Essential (primary) hypertension: Secondary | ICD-10-CM | POA: Insufficient documentation

## 2017-02-05 DIAGNOSIS — I350 Nonrheumatic aortic (valve) stenosis: Secondary | ICD-10-CM | POA: Insufficient documentation

## 2017-02-05 DIAGNOSIS — Z01810 Encounter for preprocedural cardiovascular examination: Secondary | ICD-10-CM | POA: Insufficient documentation

## 2017-02-05 DIAGNOSIS — Z8673 Personal history of transient ischemic attack (TIA), and cerebral infarction without residual deficits: Secondary | ICD-10-CM | POA: Insufficient documentation

## 2017-02-05 DIAGNOSIS — I351 Nonrheumatic aortic (valve) insufficiency: Secondary | ICD-10-CM | POA: Insufficient documentation

## 2017-02-05 DIAGNOSIS — E785 Hyperlipidemia, unspecified: Secondary | ICD-10-CM | POA: Insufficient documentation

## 2017-02-13 ENCOUNTER — Ambulatory Visit (INDEPENDENT_AMBULATORY_CARE_PROVIDER_SITE_OTHER): Payer: No Typology Code available for payment source | Admitting: Internal Medicine

## 2017-02-13 VITALS — BP 127/74 | HR 84 | Temp 98.1°F | Wt 235.9 lb

## 2017-02-13 DIAGNOSIS — D1779 Benign lipomatous neoplasm of other sites: Secondary | ICD-10-CM

## 2017-02-13 DIAGNOSIS — F17210 Nicotine dependence, cigarettes, uncomplicated: Secondary | ICD-10-CM

## 2017-02-13 DIAGNOSIS — K5900 Constipation, unspecified: Secondary | ICD-10-CM

## 2017-02-13 DIAGNOSIS — E039 Hypothyroidism, unspecified: Secondary | ICD-10-CM

## 2017-02-13 DIAGNOSIS — Z6836 Body mass index (BMI) 36.0-36.9, adult: Secondary | ICD-10-CM

## 2017-02-13 DIAGNOSIS — E785 Hyperlipidemia, unspecified: Secondary | ICD-10-CM

## 2017-02-13 DIAGNOSIS — E669 Obesity, unspecified: Secondary | ICD-10-CM

## 2017-02-13 DIAGNOSIS — K0889 Other specified disorders of teeth and supporting structures: Secondary | ICD-10-CM

## 2017-02-13 DIAGNOSIS — Z79899 Other long term (current) drug therapy: Secondary | ICD-10-CM

## 2017-02-13 DIAGNOSIS — K648 Other hemorrhoids: Secondary | ICD-10-CM | POA: Insufficient documentation

## 2017-02-13 DIAGNOSIS — I10 Essential (primary) hypertension: Secondary | ICD-10-CM

## 2017-02-13 DIAGNOSIS — K921 Melena: Secondary | ICD-10-CM

## 2017-02-13 DIAGNOSIS — R222 Localized swelling, mass and lump, trunk: Secondary | ICD-10-CM

## 2017-02-13 MED ORDER — MELOXICAM 15 MG PO TABS: 15.0000 mg | ORAL_TABLET | Freq: Every day | ORAL | 3 refills | Status: DC | PRN

## 2017-02-13 MED ORDER — LEVOTHYROXINE SODIUM 100 MCG PO TABS: 100.0000 ug | ORAL_TABLET | Freq: Every day | ORAL | 1 refills | Status: DC

## 2017-02-13 MED ORDER — MOMETASONE FUROATE 50 MCG/ACT NA SUSP: 2.0000 | Freq: Every day | NASAL | 1 refills | Status: DC | PRN

## 2017-02-13 MED ORDER — PITAVASTATIN CALCIUM 2 MG PO TABS: 2.0000 mg | ORAL_TABLET | Freq: Every day | ORAL | 3 refills | Status: DC

## 2017-02-13 NOTE — Patient Instructions (Signed)
We will see you in two month.  We have sent multiple prescriptions.  Please start taking fiber supplements daily to treat your constipation. You can also try over the counter laxatives or stool softeners such as MiraLax or Colace.  We have changed your cholesterol medications from Lipitor to Livalo.  For short term memory try and challenge your brain and keep yourself active and push your comfort boundaries on a regular basis.   We will call if any of your lab work is abnormal.  Please return in 2 months.

## 2017-02-13 NOTE — Progress Notes (Signed)
   CC: "Skin lump"  HPI:  Emma Bonilla is a 61 y.o. female with PMHx detailed below presenting for follow up with concern for a lump that she recently noticed over her upper back. She also reports hematochezia once a month and constipation.  See problem based assessment and plan below for additional details.  Past Medical History:  Diagnosis Date  . Arthritis   . Depression   . GERD (gastroesophageal reflux disease)   . Heart murmur   . Hyperlipidemia   . Hypertension   . Thyroid disease   . TIA (transient ischemic attack)    2010    Review of Systems: Review of Systems  Constitutional: Negative for chills, fever, malaise/fatigue and weight loss.  Respiratory: Negative for cough and shortness of breath.   Cardiovascular: Negative for chest pain and palpitations.  Gastrointestinal: Positive for blood in stool and constipation. Negative for abdominal pain, diarrhea, melena, nausea and vomiting.  Musculoskeletal: Negative for back pain.  Skin: Negative for rash.  Neurological: Positive for headaches.  All other systems reviewed and are negative.    Physical Exam: Vitals:   02/13/17 1424  BP: 127/74  Pulse: 84  Temp: 98.1 F (36.7 C)  TempSrc: Oral  SpO2: 100%  Weight: 235 lb 14.4 oz (107 kg)   Body mass index is 36.95 kg/m. GENERAL- Obese woman sitting comfortably in exam room chair, alert, in no distress HEENT- Atraumatic, PERRL, moist mucous membranes, poor dentition CARDIAC- Regular rate and rhythm, no murmurs, rubs or gallops. RESP- Clear to ascultation bilaterally, no wheezing or crackles, normal work of breathing ABDOMEN- Soft, nontender, nondistended BACK- Normal curvature, soft, mobile, 2-3 cm diameter subcutaneous lump present on the upper back NEURO- Alert and oriented EXTREMITIES- Normal bulk and range of motion, no edema, 2+ peripheral pulses SKIN- Warm, dry, intact, without visible rash PSYCH- Appropriate affect, clear speech, thoughts linear and  goal-directed  Assessment & Plan:   See encounters tab for problem based medical decision making.  Patient discussed with Dr. Angelia Mould

## 2017-02-14 DIAGNOSIS — D179 Benign lipomatous neoplasm, unspecified: Secondary | ICD-10-CM | POA: Insufficient documentation

## 2017-02-14 LAB — BMP8+ANION GAP
Anion Gap: 15 mmol/L (ref 10.0–18.0)
BUN/Creatinine Ratio: 12 (ref 12–28)
BUN: 10 mg/dL (ref 8–27)
CALCIUM: 9 mg/dL (ref 8.7–10.3)
CHLORIDE: 101 mmol/L (ref 96–106)
CO2: 27 mmol/L (ref 18–29)
Creatinine, Ser: 0.85 mg/dL (ref 0.57–1.00)
GFR calc non Af Amer: 75 mL/min/{1.73_m2} (ref >59)
GFR, EST AFRICAN AMERICAN: 86 mL/min/{1.73_m2} (ref >59)
GLUCOSE: 86 mg/dL (ref 65–99)
POTASSIUM: 4 mmol/L (ref 3.5–5.2)
Sodium: 143 mmol/L (ref 134–144)

## 2017-02-14 LAB — CBC
Hematocrit: 35.4 % (ref 34.0–46.6)
Hemoglobin: 10.9 g/dL — ABNORMAL LOW (ref 11.1–15.9)
MCH: 24.7 pg — AB (ref 26.6–33.0)
MCHC: 30.8 g/dL — ABNORMAL LOW (ref 31.5–35.7)
MCV: 80 fL (ref 79–97)
PLATELETS: 422 10*3/uL — AB (ref 150–379)
RBC: 4.42 x10E6/uL (ref 3.77–5.28)
RDW: 15.6 % — ABNORMAL HIGH (ref 12.3–15.4)
WBC: 6.4 10*3/uL (ref 3.4–10.8)

## 2017-02-14 LAB — TSH: TSH: 2.14 u[IU]/mL (ref 0.450–4.500)

## 2017-02-14 MED ORDER — FERROUS SULFATE 325 (65 FE) MG PO TABS: 325.0000 mg | ORAL_TABLET | Freq: Every day | ORAL | 2 refills | Status: DC

## 2017-02-14 NOTE — Assessment & Plan Note (Signed)
Requested changing her statin to Pitavastatin which is covered at the health department with her medication assistance program.  Plan: - Dc'd Lipitor 10 mg and start Pitavastatin (Livalo) 2 mg daily

## 2017-02-14 NOTE — Assessment & Plan Note (Addendum)
BP 127/74 today, at goal - continue current antihypertensives Check BMP today

## 2017-02-14 NOTE — Assessment & Plan Note (Signed)
Reports internal hemorrhoids were discovered on recent colonoscopy, and that she has been having a bright red bloody BM approx once a month for a long time. Reports constipation and straining with daily BM often. No signs or symptoms of anemia at this time. Scheduled for banding with Dr. Havery Moros in early April.   Plan: - Check CBC today - Hb 10.9 from 14 1 year ago, MCV 80, RDW and plts mild elevated likely reactive - Advised to take daily fiber supplement or PRN stool softeners - Will Rx daily iron supplement

## 2017-02-14 NOTE — Assessment & Plan Note (Signed)
Concerned about a lump that her grandchild noticed on her upper back. She is unsure how long it has been present. It is approx 2-3 cm in diameter, soft, subcutaneous, mobile, non-tender. Most consistent with benign lipoma.  Plan: - Continue to monitor

## 2017-02-14 NOTE — Assessment & Plan Note (Signed)
Reported some recent issues with constipation, 1 BM daily but straining often.   Plan: - Check TSH - normal

## 2017-02-16 NOTE — Progress Notes (Signed)
Internal Medicine Clinic Attending  Case discussed with Dr. Johnson at the time of the visit.  We reviewed the resident's history and exam and pertinent patient test results.  I agree with the assessment, diagnosis, and plan of care documented in the resident's note.  

## 2017-03-03 ENCOUNTER — Ambulatory Visit (INDEPENDENT_AMBULATORY_CARE_PROVIDER_SITE_OTHER): Payer: No Typology Code available for payment source | Admitting: Gastroenterology

## 2017-03-03 DIAGNOSIS — K649 Unspecified hemorrhoids: Secondary | ICD-10-CM

## 2017-03-03 NOTE — Patient Instructions (Signed)
If you are age 61 or older, your body mass index should be between 23-30. Your There is no height or weight on file to calculate BMI. If this is out of the aforementioned range listed, please consider follow up with your Primary Care Provider.  If you are age 9 or younger, your body mass index should be between 19-25. Your There is no height or weight on file to calculate BMI. If this is out of the aformentioned range listed, please consider follow up with your Primary Care Provider.   HEMORRHOID BANDING PROCEDURE    FOLLOW-UP CARE   1. The procedure you have had should have been relatively painless since the banding of the area involved does not have nerve endings and there is no pain sensation.  The rubber band cuts off the blood supply to the hemorrhoid and the band may fall off as soon as 48 hours after the banding (the band may occasionally be seen in the toilet bowl following a bowel movement). You may notice a temporary feeling of fullness in the rectum which should respond adequately to plain Tylenol or Motrin.  2. Following the banding, avoid strenuous exercise that evening and resume full activity the next day.  A sitz bath (soaking in a warm tub) or bidet is soothing, and can be useful for cleansing the area after bowel movements.     3. To avoid constipation, take two tablespoons of natural wheat bran, natural oat bran, flax, Benefiber or any over the counter fiber supplement and increase your water intake to 7-8 glasses daily.    4. Unless you have been prescribed anorectal medication, do not put anything inside your rectum for two weeks: No suppositories, enemas, fingers, etc.  5. Occasionally, you may have more bleeding than usual after the banding procedure.  This is often from the untreated hemorrhoids rather than the treated one.  Don't be concerned if there is a tablespoon or so of blood.  If there is more blood than this, lie flat with your bottom higher than your head and  apply an ice pack to the area. If the bleeding does not stop within a half an hour or if you feel faint, call our office at [phone] or go to the emergency room.  6. Problems are not common; however, if there is a substantial amount of bleeding, severe pain, chills, fever or difficulty passing urine (very rare) or other problems, you should call us at [phone] or report to the nearest emergency room.  7. Do not stay seated continuously for more than 2-3 hours for a day or two after the procedure.  Tighten your buttock muscles 10-15 times every two hours and take 10-15 deep breaths every 1-2 hours.  Do not spend more than a few minutes on the toilet if you cannot empty your bowel; instead re-visit the toilet at a later time.    Please purchase the daily fiber supplement Citrucel. This is over the counter.  Please follow up with Dr. Havery Moros 03/19/17 at 3:45pm.  Thank you.

## 2017-03-03 NOTE — Progress Notes (Signed)
PROCEDURE NOTE: The patient presents with symptomatic grade II  hemorrhoids, requesting rubber band ligation of his/her hemorrhoidal disease for symptoms of bleeding.  All risks, benefits and alternative forms of therapy were described and informed consent was obtained.  In the Left Lateral Decubitus position anoscopic examination revealed grade II hemorrhoids in all position(s).  The anorectum was pre-medicated with 0.125% nitroglycerin ointment The decision was made to band the LL internal hemorrhoid, and the Thornton was used to perform band ligation without complication.  Digital anorectal examination was then performed to assure proper positioning of the band, and to adjust the banded tissue as required.  The patient was discharged home without pain or other issues.  Dietary and behavioral recommendations were given and along with follow-up instructions.     The following adjunctive treatments were recommended: Daily fiber supplement Hold meloxicam for 2 weeks  The patient will return in 2-4 weeks for  follow-up and possible additional banding as required. No complications were encountered and the patient tolerated the procedure well.  Kenwood Cellar, MD [facility] Pager [phone]

## 2017-03-11 ENCOUNTER — Other Ambulatory Visit: Payer: Self-pay | Admitting: *Deleted

## 2017-03-12 MED ORDER — OLOPATADINE HCL 0.2 % OP SOLN: 1.0000 [drp] | Freq: Every day | OPHTHALMIC | 3 refills | Status: DC | PRN

## 2017-03-19 ENCOUNTER — Encounter: Payer: No Typology Code available for payment source | Admitting: Gastroenterology

## 2017-03-24 ENCOUNTER — Telehealth: Payer: Self-pay | Admitting: Internal Medicine

## 2017-03-24 NOTE — Telephone Encounter (Signed)
APT. REMINDER CALL, LMTCB °

## 2017-03-25 ENCOUNTER — Ambulatory Visit: Payer: No Typology Code available for payment source

## 2017-03-26 ENCOUNTER — Other Ambulatory Visit: Payer: Self-pay

## 2017-03-26 ENCOUNTER — Telehealth: Payer: Self-pay | Admitting: Internal Medicine

## 2017-03-26 NOTE — Telephone Encounter (Signed)
APT. REMINDER CALL, LMTCB °

## 2017-03-27 ENCOUNTER — Ambulatory Visit (INDEPENDENT_AMBULATORY_CARE_PROVIDER_SITE_OTHER): Payer: No Typology Code available for payment source | Admitting: Internal Medicine

## 2017-03-27 ENCOUNTER — Encounter: Payer: Self-pay | Admitting: Internal Medicine

## 2017-03-27 VITALS — BP 118/75 | HR 84 | Temp 98.3°F | Ht 67.0 in | Wt 235.9 lb

## 2017-03-27 DIAGNOSIS — Z79899 Other long term (current) drug therapy: Secondary | ICD-10-CM

## 2017-03-27 DIAGNOSIS — K648 Other hemorrhoids: Secondary | ICD-10-CM

## 2017-03-27 DIAGNOSIS — Z6836 Body mass index (BMI) 36.0-36.9, adult: Secondary | ICD-10-CM

## 2017-03-27 DIAGNOSIS — F17210 Nicotine dependence, cigarettes, uncomplicated: Secondary | ICD-10-CM

## 2017-03-27 DIAGNOSIS — E669 Obesity, unspecified: Secondary | ICD-10-CM

## 2017-03-27 DIAGNOSIS — D500 Iron deficiency anemia secondary to blood loss (chronic): Secondary | ICD-10-CM

## 2017-03-27 DIAGNOSIS — I10 Essential (primary) hypertension: Secondary | ICD-10-CM

## 2017-03-27 DIAGNOSIS — D509 Iron deficiency anemia, unspecified: Secondary | ICD-10-CM | POA: Insufficient documentation

## 2017-03-27 DIAGNOSIS — D649 Anemia, unspecified: Secondary | ICD-10-CM

## 2017-03-27 MED ORDER — DEXLANSOPRAZOLE 30 MG PO CPDR: 30.0000 mg | DELAYED_RELEASE_CAPSULE | Freq: Every day | ORAL | 3 refills | Status: DC

## 2017-03-27 NOTE — Progress Notes (Signed)
   CC: Anemia follow up  HPI:  [name] is a 61 y.o. female with PMHx detailed below presenting for follow up of her anemia and hematochezia. She is feeling well with no complaint and has been taking the iron supplements prescribed last visit.  See problem based assessment and plan below for additional details.  Past Medical History:  Diagnosis Date  . Arthritis   . Depression   . GERD (gastroesophageal reflux disease)   . Heart murmur   . Hyperlipidemia   . Hypertension   . Thyroid disease   . TIA (transient ischemic attack)    2010    Review of Systems: Review of Systems  Constitutional: Negative for chills, fever, malaise/fatigue and weight loss.  Respiratory: Negative for cough and shortness of breath.   Cardiovascular: Negative for chest pain and palpitations.  Gastrointestinal: Positive for blood in stool and constipation. Negative for abdominal pain, diarrhea, heartburn, melena, nausea and vomiting.  Genitourinary: Negative for hematuria.  Skin: Negative for rash.  Neurological: Negative for dizziness and weakness.  All other systems reviewed and are negative.    Physical Exam: Vitals:   03/27/17 1552  BP: 118/75  Pulse: 84  Temp: 98.3 F (36.8 C)  TempSrc: Oral  SpO2: 100%  Weight: 235 lb 14.4 oz (107 kg)  Height: 5\' 7"  (1.702 m)   Body mass index is 36.95 kg/m. GENERAL- Obese woman sitting comfortably in exam room chair, alert, in no distress HEENT- Atraumatic, PERRL, moist mucous membranes, poor dentition CARDIAC- Regular rate and rhythm, no murmurs, rubs or gallops. 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  Patient discussed with Dr. Lynnae January

## 2017-03-27 NOTE — Patient Instructions (Addendum)
Please continue taking your medications as prescribed.  We believe you are mildly anemic due to some chronic slow blood loss from your internal hemorrhoids. Please continue taking you iron and fiber supplements daily. Avoid constipating foods. Please follow up with Dr. Havery Moros to have your remaining hemorrhoids banded.   We have checked some blood work today and will call if any of the results are concerning.   Please return in about 4 months for a follow up appointment.

## 2017-03-28 LAB — CBC
Hematocrit: 42.3 % (ref 34.0–46.6)
Hemoglobin: 13.5 g/dL (ref 11.1–15.9)
MCH: 27.3 pg (ref 26.6–33.0)
MCHC: 31.9 g/dL (ref 31.5–35.7)
MCV: 86 fL (ref 79–97)
Platelets: 386 10*3/uL — ABNORMAL HIGH (ref 150–379)
RBC: 4.95 x10E6/uL (ref 3.77–5.28)
RDW: 18.9 % — AB (ref 12.3–15.4)
WBC: 5.9 10*3/uL (ref 3.4–10.8)

## 2017-03-28 LAB — FERRITIN: Ferritin: 24 ng/mL (ref 15–150)

## 2017-03-28 NOTE — Assessment & Plan Note (Signed)
BP 118/75 today, well-controlled on current medications.   Plan: -- Continue Metoprolol succinate 25 mg and Olmesartan-HCTZ 20-12.5 mg daily

## 2017-03-28 NOTE — Assessment & Plan Note (Addendum)
Anemia discovered at previous visit with Hb 10.9 from 14 previously, with MCV 80. Started on ferrous sulfate 325 mg QD last visit. This anemia is a presumed iron deficiency anemia due to chronic GI bleed. She has been having mild intermittent hematochezia (BRBPR few times monthly) and has known internal hemorrhoids. Since last visit she has had some of these hemorrhoids banded via Dr. Havery Moros and plans to return to have more treated. Since last visit can recall only one episode of hematochezia. She has been taking her iron pills. Denies fatigue, dyspnea, pica, chest pain.  Plan: -- Recheck CBC today - Hb has recovered to 13.5, MCV 86 -- Check ferritin - low normal range at 24, has been taking iron supplements  -- Continue ferrous sulfate 325 mg daily -- Check CBC at next visit

## 2017-03-30 NOTE — Progress Notes (Signed)
Internal Medicine Clinic Attending  Case discussed with Dr. Johnson at the time of the visit.  We reviewed the resident's history and exam and pertinent patient test results.  I agree with the assessment, diagnosis, and plan of care documented in the resident's note.  

## 2017-03-31 ENCOUNTER — Telehealth: Payer: Self-pay

## 2017-03-31 NOTE — Telephone Encounter (Signed)
Dexlansoprazole called into [facility]

## 2017-05-11 ENCOUNTER — Encounter: Payer: Self-pay | Admitting: *Deleted

## 2017-05-12 ENCOUNTER — Other Ambulatory Visit: Payer: Self-pay | Admitting: Internal Medicine

## 2017-05-12 ENCOUNTER — Encounter: Payer: Self-pay | Admitting: Gastroenterology

## 2017-05-12 ENCOUNTER — Telehealth: Payer: Self-pay | Admitting: *Deleted

## 2017-05-12 ENCOUNTER — Ambulatory Visit (INDEPENDENT_AMBULATORY_CARE_PROVIDER_SITE_OTHER): Payer: Self-pay | Admitting: Gastroenterology

## 2017-05-12 VITALS — BP 150/88 | HR 68 | Ht 66.34 in | Wt 237.0 lb

## 2017-05-12 DIAGNOSIS — K641 Second degree hemorrhoids: Secondary | ICD-10-CM

## 2017-05-12 NOTE — Telephone Encounter (Signed)
Emma Bonilla calls and states that benicar is no longer a free medication, they would like to know if you would consider edarbi- they will apply for pt to receive for free but at this time pt will need to purchase either hctz, losartan/hctz or lisinopril/hctz. Please advise and send new script via fax to [email or web address]

## 2017-05-12 NOTE — Patient Instructions (Addendum)
If you are age 61 or older, your body mass index should be between 23-30. Your Body mass index is 37.86 kg/m. If this is out of the aforementioned range listed, please consider follow up with your Primary Care Provider.  If you are age 35 or younger, your body mass index should be between 19-25. Your Body mass index is 37.86 kg/m. If this is out of the aformentioned range listed, please consider follow up with your Primary Care Provider.   HEMORRHOID BANDING PROCEDURE    FOLLOW-UP CARE   1. The procedure you have had should have been relatively painless since the banding of the area involved does not have nerve endings and there is no pain sensation.  The rubber band cuts off the blood supply to the hemorrhoid and the band may fall off as soon as 48 hours after the banding (the band may occasionally be seen in the toilet bowl following a bowel movement). You may notice a temporary feeling of fullness in the rectum which should respond adequately to plain Tylenol or Motrin.  2. Following the banding, avoid strenuous exercise that evening and resume full activity the next day.  A sitz bath (soaking in a warm tub) or bidet is soothing, and can be useful for cleansing the area after bowel movements.     3. To avoid constipation, take two tablespoons of natural wheat bran, natural oat bran, flax, Benefiber or any over the counter fiber supplement and increase your water intake to 7-8 glasses daily.    4. Unless you have been prescribed anorectal medication, do not put anything inside your rectum for two weeks: No suppositories, enemas, fingers, etc.  5. Occasionally, you may have more bleeding than usual after the banding procedure.  This is often from the untreated hemorrhoids rather than the treated one.  Don't be concerned if there is a tablespoon or so of blood.  If there is more blood than this, lie flat with your bottom higher than your head and apply an ice pack to the area. If the bleeding  does not stop within a half an hour or if you feel faint, call our office at [phone] or go to the emergency room.  6. Problems are not common; however, if there is a substantial amount of bleeding, severe pain, chills, fever or difficulty passing urine (very rare) or other problems, you should call us at [phone] or report to the nearest emergency room.  7. Do not stay seated continuously for more than 2-3 hours for a day or two after the procedure.  Tighten your buttock muscles 10-15 times every two hours and take 10-15 deep breaths every 1-2 hours.  Do not spend more than a few minutes on the toilet if you cannot empty your bowel; instead re-visit the toilet at a later time.    Please follow up with Dr.Armbruster on Wednesday, July 11th at 4:00pm  Thank you.

## 2017-05-12 NOTE — Progress Notes (Signed)
Patient here for follow up for hemorrhoid therapy. S/p banding of LL hemorrhoid in April with significant reduction in bleeding symptoms, tolerated it well. Here for repeat banding today.  PROCEDURE NOTE: The patient presents with symptomatic grade II  hemorrhoids, requesting rubber band ligation of his/her hemorrhoidal disease.  All risks, benefits and alternative forms of therapy were described and informed consent was obtained.  The anorectum was pre-medicated with 0.125% nitroglycerin The decision was made to band the RP internal hemorrhoid, and the [place]ord was used to perform band ligation without complication.  Digital anorectal examination was then performed to assure proper positioning of the band, and to adjust the banded tissue as required.  The patient was discharged home without pain or other issues.  Dietary and behavioral recommendations were given and along with follow-up instructions.     The following adjunctive treatments were recommended: Daily fiber supplement  The patient will return in 2.4 weeks for  follow-up and possible additional banding as required. No complications were encountered and the patient tolerated the procedure well.  Lafferty Cellar, MD [facility] Pager [phone]

## 2017-05-20 MED ORDER — LOSARTAN POTASSIUM-HCTZ 50-12.5 MG PO TABS: 1.0000 | ORAL_TABLET | Freq: Every day | ORAL | 3 refills | Status: DC

## 2017-05-20 NOTE — Telephone Encounter (Signed)
Losartan-HCTZ will be fine. New script faxed to health department.

## 2017-05-27 ENCOUNTER — Other Ambulatory Visit: Payer: Self-pay | Admitting: Internal Medicine

## 2017-06-10 ENCOUNTER — Encounter: Payer: No Typology Code available for payment source | Admitting: Gastroenterology

## 2017-06-25 ENCOUNTER — Other Ambulatory Visit: Payer: Self-pay | Admitting: *Deleted

## 2017-06-25 MED ORDER — PITAVASTATIN CALCIUM 2 MG PO TABS: 2.0000 mg | ORAL_TABLET | Freq: Every day | ORAL | 6 refills | Status: DC

## 2017-07-07 ENCOUNTER — Encounter: Payer: Self-pay | Admitting: Gastroenterology

## 2017-07-07 ENCOUNTER — Ambulatory Visit (INDEPENDENT_AMBULATORY_CARE_PROVIDER_SITE_OTHER): Payer: Self-pay | Admitting: Gastroenterology

## 2017-07-07 VITALS — BP 150/88 | HR 88 | Ht 66.34 in | Wt 238.2 lb

## 2017-07-07 DIAGNOSIS — K641 Second degree hemorrhoids: Secondary | ICD-10-CM

## 2017-07-07 NOTE — Patient Instructions (Signed)
If you are age 61 or older, your body mass index should be between 23-30. Your Body mass index is 38.06 kg/m. If this is out of the aforementioned range listed, please consider follow up with your Primary Care Provider.  If you are age 49 or younger, your body mass index should be between 19-25. Your Body mass index is 38.06 kg/m. If this is out of the aformentioned range listed, please consider follow up with your Primary Care Provider.   HEMORRHOID BANDING PROCEDURE    FOLLOW-UP CARE   1. The procedure you have had should have been relatively painless since the banding of the area involved does not have nerve endings and there is no pain sensation.  The rubber band cuts off the blood supply to the hemorrhoid and the band may fall off as soon as 48 hours after the banding (the band may occasionally be seen in the toilet bowl following a bowel movement). You may notice a temporary feeling of fullness in the rectum which should respond adequately to plain Tylenol or Motrin.  2. Following the banding, avoid strenuous exercise that evening and resume full activity the next day.  A sitz bath (soaking in a warm tub) or bidet is soothing, and can be useful for cleansing the area after bowel movements.     3. To avoid constipation, take two tablespoons of natural wheat bran, natural oat bran, flax, Benefiber or any over the counter fiber supplement and increase your water intake to 7-8 glasses daily.    4. Unless you have been prescribed anorectal medication, do not put anything inside your rectum for two weeks: No suppositories, enemas, fingers, etc.  5. Occasionally, you may have more bleeding than usual after the banding procedure.  This is often from the untreated hemorrhoids rather than the treated one.  Don't be concerned if there is a tablespoon or so of blood.  If there is more blood than this, lie flat with your bottom higher than your head and apply an ice pack to the area. If the bleeding  does not stop within a half an hour or if you feel faint, call our office at [phone] or go to the emergency room.  6. Problems are not common; however, if there is a substantial amount of bleeding, severe pain, chills, fever or difficulty passing urine (very rare) or other problems, you should call us at [phone] or report to the nearest emergency room.  7. Do not stay seated continuously for more than 2-3 hours for a day or two after the procedure.  Tighten your buttock muscles 10-15 times every two hours and take 10-15 deep breaths every 1-2 hours.  Do not spend more than a few minutes on the toilet if you cannot empty your bowel; instead re-visit the toilet at a later time.    Please follow as needed.

## 2017-07-07 NOTE — Progress Notes (Signed)
Patient here for final hemorrhoid banding today. She has had significant improvement with banding thus far, no further bleeding. She has tolerated banding well thus far.  PROCEDURE NOTE: The patient presents with symptomatic grade II  hemorrhoids, requesting rubber band ligation of his/her hemorrhoidal disease.  All risks, benefits and alternative forms of therapy were described and informed consent was obtained.   The anorectum was pre-medicated with 0.125% nitroglycerin The decision was made to band the RA internal hemorrhoid, and the Carney was used to perform band ligation without complication.  Digital anorectal examination was then performed to assure proper positioning of the band, and to adjust the banded tissue as required.  The patient was discharged home without pain or other issues.  Dietary and behavioral recommendations were given and along with follow-up instructions.      The patient will follow-up as needed moving forward. She has now had 3 bands placed, overall significantly improved with treatment thus far. No complications were encountered and the patient tolerated the procedure well.  Redkey Cellar, MD [facility] Pager [phone]

## 2017-07-20 ENCOUNTER — Encounter: Payer: Self-pay | Admitting: Internal Medicine

## 2017-07-20 ENCOUNTER — Ambulatory Visit (INDEPENDENT_AMBULATORY_CARE_PROVIDER_SITE_OTHER): Payer: Self-pay | Admitting: Internal Medicine

## 2017-07-20 VITALS — BP 127/82 | HR 102 | Temp 98.2°F | Ht 67.0 in | Wt 236.3 lb

## 2017-07-20 DIAGNOSIS — M62838 Other muscle spasm: Secondary | ICD-10-CM

## 2017-07-20 DIAGNOSIS — I350 Nonrheumatic aortic (valve) stenosis: Secondary | ICD-10-CM

## 2017-07-20 DIAGNOSIS — Z0000 Encounter for general adult medical examination without abnormal findings: Secondary | ICD-10-CM

## 2017-07-20 DIAGNOSIS — R200 Anesthesia of skin: Secondary | ICD-10-CM

## 2017-07-20 DIAGNOSIS — Z79899 Other long term (current) drug therapy: Secondary | ICD-10-CM

## 2017-07-20 DIAGNOSIS — R202 Paresthesia of skin: Secondary | ICD-10-CM

## 2017-07-20 DIAGNOSIS — I10 Essential (primary) hypertension: Secondary | ICD-10-CM

## 2017-07-20 DIAGNOSIS — R011 Cardiac murmur, unspecified: Secondary | ICD-10-CM

## 2017-07-20 LAB — GLUCOSE, CAPILLARY: GLUCOSE-CAPILLARY: 72 mg/dL (ref 65–99)

## 2017-07-20 LAB — POCT GLYCOSYLATED HEMOGLOBIN (HGB A1C): Hemoglobin A1C: 5.8

## 2017-07-20 MED ORDER — CETIRIZINE HCL 10 MG PO TABS: 10.0000 mg | ORAL_TABLET | Freq: Every day | ORAL | 3 refills | Status: DC

## 2017-07-20 NOTE — Assessment & Plan Note (Signed)
Check A1c. Will call patient if results are abnormal.

## 2017-07-20 NOTE — Assessment & Plan Note (Signed)
Patient complains of a "funny feeling" in her left arm which she describes as tingling that started a few days ago. Experienced similar symptoms in the past many years ago that resolved spontaneously. This does not radiate. States it is worse at night. Unsure how long these episodes last. She is unable to identify alleviating and aggravating factors. Denies weakness, neck pain, and shoulder pain. She has a history of aortic stenosis but no prior MIs. Recent TTE with EF 60% with no structural abnormalities, and aortic stenosis noted. Denies chest pain, shortness of breath, nausea or vomiting during these episodes. Denies recent trauma.  Unclear etiology at this time. Her neurological exam is normal. Low suspicion for cervical radiculopathy given no radiation to or from the neck. ACS can present atypically in older females. However, low suspicion for a cardiac etiology at this time given no associated symptoms. Unlikely to be diabetic neuropathy given no glove and stocking distribution and no history or diabetes.  - Will continue to monitor given the symptoms are not bothersome to patient at this time. Instructed to call [facility] clinic or make an appointment if her symptoms worsen.

## 2017-07-20 NOTE — Progress Notes (Signed)
   CC: hypertension follow-up, right upper quadrant muscle spasm and tingling in left arm  HPI:  [name] is a 61 y.o. female with PMH listed below who presents to clinic for hypertension follow-up, right upper quadrant muscle spasm and tingling in left arm. Please see problem based assessment and plan for further details.   Past Medical History:  Diagnosis Date  . Arthritis   . Depression   . GERD (gastroesophageal reflux disease)   . Heart murmur   . Hyperlipidemia   . Hypertension   . Thyroid disease   . TIA (transient ischemic attack)    2010   Review of Systems:   Review of Systems  Constitutional: Negative for chills, fever and malaise/fatigue.  Respiratory: Negative for cough and shortness of breath.   Cardiovascular: Negative for chest pain, palpitations and leg swelling.  Gastrointestinal: Negative for abdominal pain, constipation, diarrhea, nausea and vomiting.  Musculoskeletal: Negative for back pain, falls, joint pain, myalgias and neck pain.  Neurological: Negative for sensory change and focal weakness.    Physical Exam:  Vitals:   07/20/17 1541  BP: 127/82  Pulse: (!) 102  Temp: 98.2 F (36.8 C)  TempSrc: Oral  SpO2: 99%  Weight: 236 lb 4.8 oz (107.2 kg)  Height: 5\' 7"  (1.702 m)   General: Pleasant female, well-developed, in no acute distress HENT: NCAT, neck supple and FROM Cardiac: regular rate and rhythm, II/VI systolic murmur best heard over right sternal border Pulm: CTAB, no wheezes or crackles, no increased work of breathing  Abd: soft, NTND, bowel sounds present  Neuro: A&Ox3, upper extremities with intact sensation and motor strength, normal passive and active range of motion Ext: warm and well perfused, no peripheral edema  Derm: No rashes or lesions noted    Assessment & Plan:   See Encounters Tab for problem based charting.  Patient seen with Dr. Angelia Mould

## 2017-07-20 NOTE — Assessment & Plan Note (Signed)
Patient reports right upper quadrant muscle spasms below her right breast. She describes this as a tightness and burning sensation. Denies pain. States this episodes occur randomly with no apparent triggers. Started 2 months ago and episodes last about 5 minutes. Has not been able to identify alleviating or aggravating factors. Denies association with food intake. States she tries stretching with no improvement. Denies recent trauma to the area. She has not noticed new rashes. No history of shingles. Denies similar symptoms in the past.  Unclear etiology at this time. Suspect this could be MSK pain. Unlikely to be neuropathic due to no dermatomal distribution. Low suspicion for a biliary etiology as abdominal exam benign today. No gross abnormalities observed on exam.  - Will continue to monitor given that symptoms are not bothersome at this time. Patient instructed to call [facility] clinic or make an appointment if symptoms worsen.

## 2017-07-20 NOTE — Patient Instructions (Signed)
It was nice to meet you today, Emma Bonilla.   Please make another appt if your muscle spasm and arm tingling get worse.   I will call you if results from your blood test are abnormal.

## 2017-07-20 NOTE — Assessment & Plan Note (Signed)
BP at goal 127/82. Currently on metoprolol 12.5 mg daily and losartan-hydrochlorothiazide 50-12.5 mg daily. Creatinine 0.85 on 02/13/2017. Denies headache, blurry vision, chest pain, shortness of breath, and leg swelling.  - Will continue current regimen

## 2017-07-26 NOTE — Progress Notes (Signed)
Internal Medicine Clinic Attending  I saw and evaluated the patient.  I personally confirmed the key portions of the history and exam documented by Dr. Santos-Sanchez and I reviewed pertinent patient test results.  The assessment, diagnosis, and plan were formulated together and I agree with the documentation in the resident's note. 

## 2017-08-31 ENCOUNTER — Other Ambulatory Visit: Payer: Self-pay | Admitting: *Deleted

## 2017-09-03 MED ORDER — LEVOTHYROXINE SODIUM 100 MCG PO TABS: 100.0000 ug | ORAL_TABLET | Freq: Every day | ORAL | 1 refills | Status: DC

## 2017-09-21 ENCOUNTER — Ambulatory Visit: Payer: Self-pay

## 2017-09-23 ENCOUNTER — Telehealth: Payer: Self-pay | Admitting: Internal Medicine

## 2017-09-23 NOTE — Telephone Encounter (Signed)
Error- dr.B

## 2017-09-29 ENCOUNTER — Other Ambulatory Visit: Payer: Self-pay | Admitting: Internal Medicine

## 2017-09-29 DIAGNOSIS — Z1231 Encounter for screening mammogram for malignant neoplasm of breast: Secondary | ICD-10-CM

## 2017-10-12 ENCOUNTER — Emergency Department (HOSPITAL_COMMUNITY): Payer: Self-pay

## 2017-10-12 ENCOUNTER — Encounter (HOSPITAL_COMMUNITY): Payer: Self-pay | Admitting: Emergency Medicine

## 2017-10-12 ENCOUNTER — Other Ambulatory Visit: Payer: Self-pay

## 2017-10-12 ENCOUNTER — Inpatient Hospital Stay (HOSPITAL_COMMUNITY)
Admission: EM | Admit: 2017-10-12 | Discharge: 2017-10-14 | DRG: 247 | Disposition: A | Discharge status: Home / Self Care | Payer: Self-pay | Attending: Internal Medicine | Admitting: Internal Medicine

## 2017-10-12 DIAGNOSIS — Z8673 Personal history of transient ischemic attack (TIA), and cerebral infarction without residual deficits: Secondary | ICD-10-CM

## 2017-10-12 DIAGNOSIS — R0600 Dyspnea, unspecified: Secondary | ICD-10-CM

## 2017-10-12 DIAGNOSIS — Z720 Tobacco use: Secondary | ICD-10-CM | POA: Diagnosis present

## 2017-10-12 DIAGNOSIS — I350 Nonrheumatic aortic (valve) stenosis: Secondary | ICD-10-CM

## 2017-10-12 DIAGNOSIS — Z955 Presence of coronary angioplasty implant and graft: Secondary | ICD-10-CM

## 2017-10-12 DIAGNOSIS — I200 Unstable angina: Secondary | ICD-10-CM

## 2017-10-12 DIAGNOSIS — Z6835 Body mass index (BMI) 35.0-35.9, adult: Secondary | ICD-10-CM

## 2017-10-12 DIAGNOSIS — I352 Nonrheumatic aortic (valve) stenosis with insufficiency: Secondary | ICD-10-CM | POA: Diagnosis present

## 2017-10-12 DIAGNOSIS — F17210 Nicotine dependence, cigarettes, uncomplicated: Secondary | ICD-10-CM | POA: Diagnosis present

## 2017-10-12 DIAGNOSIS — K760 Fatty (change of) liver, not elsewhere classified: Secondary | ICD-10-CM | POA: Diagnosis present

## 2017-10-12 DIAGNOSIS — Z823 Family history of stroke: Secondary | ICD-10-CM

## 2017-10-12 DIAGNOSIS — I10 Essential (primary) hypertension: Secondary | ICD-10-CM | POA: Diagnosis present

## 2017-10-12 DIAGNOSIS — R0609 Other forms of dyspnea: Secondary | ICD-10-CM

## 2017-10-12 DIAGNOSIS — Z9861 Coronary angioplasty status: Secondary | ICD-10-CM

## 2017-10-12 DIAGNOSIS — R072 Precordial pain: Secondary | ICD-10-CM

## 2017-10-12 DIAGNOSIS — R0602 Shortness of breath: Secondary | ICD-10-CM

## 2017-10-12 DIAGNOSIS — E669 Obesity, unspecified: Secondary | ICD-10-CM | POA: Diagnosis present

## 2017-10-12 DIAGNOSIS — M1990 Unspecified osteoarthritis, unspecified site: Secondary | ICD-10-CM | POA: Diagnosis present

## 2017-10-12 DIAGNOSIS — Z23 Encounter for immunization: Secondary | ICD-10-CM

## 2017-10-12 DIAGNOSIS — R011 Cardiac murmur, unspecified: Secondary | ICD-10-CM | POA: Diagnosis present

## 2017-10-12 DIAGNOSIS — K219 Gastro-esophageal reflux disease without esophagitis: Secondary | ICD-10-CM | POA: Diagnosis present

## 2017-10-12 DIAGNOSIS — Z8249 Family history of ischemic heart disease and other diseases of the circulatory system: Secondary | ICD-10-CM

## 2017-10-12 DIAGNOSIS — F329 Major depressive disorder, single episode, unspecified: Secondary | ICD-10-CM | POA: Diagnosis present

## 2017-10-12 DIAGNOSIS — Z801 Family history of malignant neoplasm of trachea, bronchus and lung: Secondary | ICD-10-CM

## 2017-10-12 DIAGNOSIS — Z833 Family history of diabetes mellitus: Secondary | ICD-10-CM

## 2017-10-12 DIAGNOSIS — Z7982 Long term (current) use of aspirin: Secondary | ICD-10-CM

## 2017-10-12 DIAGNOSIS — E876 Hypokalemia: Secondary | ICD-10-CM | POA: Diagnosis present

## 2017-10-12 DIAGNOSIS — Z79890 Hormone replacement therapy (postmenopausal): Secondary | ICD-10-CM

## 2017-10-12 DIAGNOSIS — E785 Hyperlipidemia, unspecified: Secondary | ICD-10-CM | POA: Diagnosis present

## 2017-10-12 DIAGNOSIS — Z886 Allergy status to analgesic agent status: Secondary | ICD-10-CM

## 2017-10-12 DIAGNOSIS — E039 Hypothyroidism, unspecified: Secondary | ICD-10-CM | POA: Diagnosis present

## 2017-10-12 DIAGNOSIS — I25110 Atherosclerotic heart disease of native coronary artery with unstable angina pectoris: Principal | ICD-10-CM | POA: Diagnosis present

## 2017-10-12 DIAGNOSIS — I2510 Atherosclerotic heart disease of native coronary artery without angina pectoris: Secondary | ICD-10-CM | POA: Diagnosis present

## 2017-10-12 HISTORY — DX: Ventricular premature depolarization: I49.3

## 2017-10-12 HISTORY — DX: Other ill-defined heart diseases: I51.89

## 2017-10-12 HISTORY — DX: Atrial premature depolarization: I49.1

## 2017-10-12 HISTORY — DX: Tobacco use: Z72.0

## 2017-10-12 HISTORY — DX: Nonrheumatic aortic (valve) stenosis: I35.0

## 2017-10-12 HISTORY — DX: Fatty (change of) liver, not elsewhere classified: K76.0

## 2017-10-12 HISTORY — DX: Nonrheumatic aortic (valve) insufficiency: I35.1

## 2017-10-12 LAB — BASIC METABOLIC PANEL
Anion gap: 9 (ref 5–15)
BUN: 8 mg/dL (ref 6–20)
CALCIUM: 9.2 mg/dL (ref 8.9–10.3)
CO2: 28 mmol/L (ref 22–32)
CREATININE: 1.03 mg/dL — AB (ref 0.44–1.00)
Chloride: 103 mmol/L (ref 101–111)
GFR calc non Af Amer: 57 mL/min — ABNORMAL LOW (ref >60)
Glucose, Bld: 92 mg/dL (ref 65–99)
Potassium: 3.7 mmol/L (ref 3.5–5.1)
SODIUM: 140 mmol/L (ref 135–145)

## 2017-10-12 LAB — CBC
HCT: 46.8 % — ABNORMAL HIGH (ref 36.0–46.0)
Hemoglobin: 15.7 g/dL — ABNORMAL HIGH (ref 12.0–15.0)
MCH: 30.7 pg (ref 26.0–34.0)
MCHC: 33.5 g/dL (ref 30.0–36.0)
MCV: 91.4 fL (ref 78.0–100.0)
PLATELETS: 321 10*3/uL (ref 150–400)
RBC: 5.12 MIL/uL — AB (ref 3.87–5.11)
RDW: 14.4 % (ref 11.5–15.5)
WBC: 6.8 10*3/uL (ref 4.0–10.5)

## 2017-10-12 LAB — I-STAT TROPONIN, ED: TROPONIN I, POC: 0 ng/mL (ref 0.00–0.08)

## 2017-10-12 LAB — TROPONIN I
Troponin I: <0.03 ng/mL (ref <0.03)
Troponin I: <0.03 ng/mL (ref <0.03)

## 2017-10-12 LAB — D-DIMER, QUANTITATIVE: D-Dimer, Quant: 0.59 ug/mL-FEU — ABNORMAL HIGH (ref 0.00–0.50)

## 2017-10-12 MED ORDER — ASPIRIN 325 MG PO TABS
325.0000 mg | ORAL_TABLET | Freq: Once | ORAL | Status: AC
  Administered 2017-10-12: 325 mg via ORAL
  Filled 2017-10-12: qty 1

## 2017-10-12 MED ORDER — AMITRIPTYLINE HCL 25 MG PO TABS
50.0000 mg | ORAL_TABLET | Freq: Every day | ORAL | Status: DC
  Administered 2017-10-12 – 2017-10-13 (×2): 50 mg via ORAL
  Filled 2017-10-12 (×2): qty 1
  Filled 2017-10-12: qty 2

## 2017-10-12 MED ORDER — LOSARTAN POTASSIUM-HCTZ 50-12.5 MG PO TABS: 1.0000 | ORAL_TABLET | Freq: Every day | ORAL | Status: DC

## 2017-10-12 MED ORDER — LEVOTHYROXINE SODIUM 100 MCG PO TABS
100.0000 ug | ORAL_TABLET | Freq: Every day | ORAL | Status: DC
  Administered 2017-10-13 – 2017-10-14 (×2): 100 ug via ORAL
  Filled 2017-10-12 (×2): qty 1

## 2017-10-12 MED ORDER — PANTOPRAZOLE SODIUM 40 MG PO TBEC
40.0000 mg | DELAYED_RELEASE_TABLET | Freq: Every day | ORAL | Status: DC
  Administered 2017-10-13 – 2017-10-14 (×2): 40 mg via ORAL
  Filled 2017-10-12 (×2): qty 1

## 2017-10-12 MED ORDER — MORPHINE SULFATE (PF) 4 MG/ML IV SOLN
4.0000 mg | Freq: Once | INTRAVENOUS | Status: AC
  Administered 2017-10-12: 4 mg via INTRAVENOUS
  Filled 2017-10-12: qty 1

## 2017-10-12 MED ORDER — LOSARTAN POTASSIUM 50 MG PO TABS
50.0000 mg | ORAL_TABLET | Freq: Every day | ORAL | Status: DC
  Administered 2017-10-13: 50 mg via ORAL
  Filled 2017-10-12 (×2): qty 1

## 2017-10-12 MED ORDER — ACETAMINOPHEN 325 MG PO TABS
650.0000 mg | ORAL_TABLET | Freq: Four times a day (QID) | ORAL | Status: DC | PRN
Start: 2017-10-12 — End: 2017-10-13
  Administered 2017-10-13: 650 mg via ORAL
  Filled 2017-10-12: qty 2

## 2017-10-12 MED ORDER — SENNOSIDES-DOCUSATE SODIUM 8.6-50 MG PO TABS: 1.0000 | ORAL_TABLET | Freq: Every evening | ORAL | Status: DC | PRN

## 2017-10-12 MED ORDER — FLUTICASONE PROPIONATE 50 MCG/ACT NA SUSP
1.0000 | Freq: Every day | NASAL | Status: DC
  Administered 2017-10-12 – 2017-10-14 (×4): 1 via NASAL
  Filled 2017-10-12 (×2): qty 16

## 2017-10-12 MED ORDER — MORPHINE SULFATE (PF) 2 MG/ML IV SOLN: 2.0000 mg | INTRAVENOUS | Status: DC | PRN

## 2017-10-12 MED ORDER — ACETAMINOPHEN 650 MG RE SUPP
650.0000 mg | Freq: Four times a day (QID) | RECTAL | Status: DC | PRN
Start: 2017-10-12 — End: 2017-10-13

## 2017-10-12 MED ORDER — NICOTINE 14 MG/24HR TD PT24
14.0000 mg | MEDICATED_PATCH | Freq: Every day | TRANSDERMAL | Status: DC
  Administered 2017-10-12 – 2017-10-14 (×3): 14 mg via TRANSDERMAL
  Filled 2017-10-12 (×3): qty 1

## 2017-10-12 MED ORDER — PNEUMOCOCCAL VAC POLYVALENT 25 MCG/0.5ML IJ INJ
0.5000 mL | INJECTION | INTRAMUSCULAR | Status: AC
  Administered 2017-10-14: 0.5 mL via INTRAMUSCULAR
  Filled 2017-10-12 (×2): qty 0.5

## 2017-10-12 MED ORDER — ASPIRIN EC 81 MG PO TBEC
81.0000 mg | DELAYED_RELEASE_TABLET | Freq: Every day | ORAL | Status: DC
  Administered 2017-10-13 – 2017-10-14 (×2): 81 mg via ORAL
  Filled 2017-10-12 (×2): qty 1

## 2017-10-12 MED ORDER — METOPROLOL SUCCINATE ER 25 MG PO TB24
12.5000 mg | ORAL_TABLET | Freq: Every day | ORAL | Status: DC
  Administered 2017-10-12 – 2017-10-13 (×2): 12.5 mg via ORAL
  Filled 2017-10-12 (×2): qty 1

## 2017-10-12 MED ORDER — PROMETHAZINE HCL 25 MG PO TABS: 12.5000 mg | ORAL_TABLET | Freq: Four times a day (QID) | ORAL | Status: DC | PRN

## 2017-10-12 MED ORDER — SODIUM CHLORIDE 0.9% FLUSH
3.0000 mL | Freq: Two times a day (BID) | INTRAVENOUS | Status: DC
  Administered 2017-10-12 – 2017-10-13 (×3): 3 mL via INTRAVENOUS

## 2017-10-12 MED ORDER — ENOXAPARIN SODIUM 40 MG/0.4ML ~~LOC~~ SOLN
40.0000 mg | SUBCUTANEOUS | Status: DC
  Administered 2017-10-12: 40 mg via SUBCUTANEOUS
  Filled 2017-10-12: qty 0.4

## 2017-10-12 MED ORDER — HYDROCHLOROTHIAZIDE 12.5 MG PO CAPS
12.5000 mg | ORAL_CAPSULE | Freq: Every day | ORAL | Status: DC
  Administered 2017-10-13: 12.5 mg via ORAL
  Filled 2017-10-12 (×3): qty 1

## 2017-10-12 MED ORDER — OLOPATADINE HCL 0.1 % OP SOLN
1.0000 [drp] | Freq: Two times a day (BID) | OPHTHALMIC | Status: DC
  Administered 2017-10-12 – 2017-10-14 (×4): 1 [drp] via OPHTHALMIC
  Filled 2017-10-12 (×2): qty 5

## 2017-10-12 MED ORDER — INFLUENZA VAC SPLIT QUAD 0.5 ML IM SUSY
0.5000 mL | PREFILLED_SYRINGE | INTRAMUSCULAR | Status: AC
  Administered 2017-10-14: 10:00:00 0.5 mL via INTRAMUSCULAR
  Filled 2017-10-12: qty 0.5

## 2017-10-12 NOTE — Progress Notes (Signed)
Patient admitted from ED via stretcher transport.  Oriented to room and call bell within reach.  Daughter with patient.

## 2017-10-12 NOTE — H&P (Signed)
Date: 10/12/2017               Patient Name:  Emma Bonilla MRN: [id number]  DOB: Sep 15, 1956 Age / Sex: 61 y.o., female   PCP: Welford Roche, MD         Medical Service: Internal Medicine Teaching Service         Attending Physician: Dr. Aldine Contes, MD    First Contact: Dr. Lars Mage Pager: [phone]  Second Contact: Dr. Burgess Estelle Pager: [phone]       After Hours (After 5p/  First Contact Pager: [phone]  weekends / holidays): Second Contact Pager: [phone]   Chief Complaint: Chest Pain  History of Present Illness: 61 year old female with past medical history of hyperlipidemia, osteoarthritis, hypothyroidism, TIA who presented with chest pain.  Patient states that the chest pain began this morning around 6 AM when he woke up.  The chest pain.  He describes the chest pain as a 10/10 intensity, pressure-like in nature, present over the left anterior chest.  Patient states that the chest pain is intermittent and each time lasts approximately 30 minutes to 2-1/2 hours.  This pain does not change in quality or quantity with duration.  She does state however that the pain is alleviated somewhat when she lies on her left side. She has accompanied nausea and vomiting. She denies any falls, trauma, heavy lifting recently. She took aspirin this morning like she does regularly.  She states that she also had an episode of similar type chest pain last night 10/11/2017.  However, the patient has never had such type of chest pain prior to 10/11/2017. The patient states that she has had the chest pain when she was in the ED.   The chest pain is accompanied with sweating.  She denies any shortness of breath, chills, fevers.  She states that this chest pain is not as a reflection of her pain. The patient has not had any recent trauma, surgeries, or long periods of time where she has been immobile.  ED course: ekg, chest x-ray  Meds:  Current Facility-Administered Medications  for the 10/12/17 encounter [facility] Encounter)  Medication  . 0.9 %  sodium chloride infusion   Current Meds  Medication Sig  . amitriptyline (ELAVIL) 25 MG tablet TAKE TWO TABLETS BY MOUTH AT BEDTIME  . aspirin 81 MG tablet Take 1 tablet (81 mg total) by mouth daily.  . Biotin 5000 MCG CAPS Take by mouth.  . calcium-vitamin D (OSCAL WITH D) 500-200 MG-UNIT per tablet Take 1 tablet by mouth daily. (Patient taking differently: Take 1 tablet by mouth 2 (two) times daily. )  . cetirizine (ZYRTEC) 10 MG tablet Take 1 tablet (10 mg total) by mouth daily.  Marland Kitchen Dexlansoprazole 30 MG capsule Take 1 capsule (30 mg total) by mouth daily.  . ferrous sulfate 325 (65 FE) MG tablet TAKE 1 TABLET BY MOUTH ONCE DAILY  . levothyroxine (SYNTHROID) 100 MCG tablet Take 1 tablet (100 mcg total) by mouth daily.  Marland Kitchen losartan-hydrochlorothiazide (HYZAAR) 50-12.5 MG tablet Take 1 tablet by mouth daily. (Patient taking differently: Take 0.5 tablets by mouth daily. )  . Melatonin 10 MG CAPS Take by mouth.  . Menthol, Topical Analgesic, (STOPAIN) 8 % LIQD Apply 1 application daily as needed topically (knee and ankle pain).  . metoprolol succinate (TOPROL-XL) 25 MG 24 hr tablet TAKE ONE-HALF TABLET BY MOUTH ONCE DAILY (Patient taking differently: TAKE ONE-HALF TABLET  12.5mg  BY MOUTH ONCE DAILY)  .  mometasone (NASONEX) 50 MCG/ACT nasal spray Place 2 sprays into the nose daily as needed.  . Olopatadine HCl 0.2 % SOLN Apply 1 drop to eye daily as needed.  . Pitavastatin Calcium (LIVALO) 2 MG TABS Take 1 tablet (2 mg total) by mouth daily at 6 (six) AM.     Allergies: Allergies as of 10/12/2017 - Review Complete 10/12/2017  Allergen Reaction Noted  . Naproxen sodium Rash 07/25/2011   Past Medical History:  Diagnosis Date  . Arthritis   . Depression   . GERD (gastroesophageal reflux disease)   . Heart murmur   . Hyperlipidemia   . Hypertension   . Thyroid disease   . TIA (transient ischemic attack)    2010     Family History:   Heart disease- father, mother, both sisters Lung cancer- mother   Social History:   2 ppd previously now she is smoking 5-6 cigarettes, etoh occasionally, smokes weed  Review of Systems: A complete ROS was negative except as per HPI.   Physical Exam: Blood pressure (!) 153/78, pulse 62, temperature 97.8 F (36.6 C), temperature source Oral, resp. rate 18, height 5\' 7"  (1.702 m), weight 232 lb 4 oz (105.3 kg), last menstrual period 02/11/2011, SpO2 100 %.  Physical Exam  Constitutional: She appears well-developed and well-nourished.  Non-toxic appearance. She does not appear ill.  HENT:  Head: Normocephalic and atraumatic.  Neck: No JVD present.  Cardiovascular: Normal rate and regular rhythm.  Pulmonary/Chest: Effort normal and breath sounds normal. No accessory muscle usage. No respiratory distress.  Abdominal: Soft. Bowel sounds are normal. She exhibits no distension. There is no tenderness.  Neurological: She is alert.  Psychiatric: She has a normal mood and affect. Her behavior is normal. Her mood appears not anxious. She is not agitated.   BMP Latest Ref Rng & Units 10/12/2017 02/13/2017 06/13/2016  Glucose 65 - 99 mg/dL 92 86 96  BUN 6 - 20 mg/dL 8 10 10   Creatinine 0.44 - 1.00 mg/dL 1.03(H) 0.85 0.85  BUN/Creat Ratio 12 - 28 - 12 12  Sodium 135 - 145 mmol/L 140 143 141  Potassium 3.5 - 5.1 mmol/L 3.7 4.0 4.1  Chloride 101 - 111 mmol/L 103 101 102  CO2 22 - 32 mmol/L 28 27 27   Calcium 8.9 - 10.3 mg/dL 9.2 9.0 9.4   CBC    Component Value Date/Time   WBC 6.8 10/12/2017 1317   RBC 5.12 (H) 10/12/2017 1317   HGB 15.7 (H) 10/12/2017 1317   HGB 13.5 03/27/2017 1605   HCT 46.8 (H) 10/12/2017 1317   HCT 42.3 03/27/2017 1605   PLT 321 10/12/2017 1317   PLT 386 (H) 03/27/2017 1605   MCV 91.4 10/12/2017 1317   MCV 86 03/27/2017 1605   MCH 30.7 10/12/2017 1317   MCHC 33.5 10/12/2017 1317   RDW 14.4 10/12/2017 1317   RDW 18.9 (H) 03/27/2017 1605    LYMPHSABS 3.7 03/19/2015 1419   MONOABS 0.3 03/19/2015 1419   EOSABS 0.3 03/19/2015 1419   BASOSABS 0.0 03/19/2015 1419    EKG: normal sinus rhythm, low voltage   CXR: no active cardiopulmonary disease   Assessment & Plan by Problem:  Chest pain Patient presented with chest pain starting from yesterday 10/11/2017 that has been occurring intermittently, each episode lasting between 30 minutes to 2.5 hours. The patient had an initial troponin of 0 and did not have any st or t wave changes on ekg.   The patient's Heart score is 4 (  moderately suspicious hx, age 51-64, >3 risk factors) which places the patient at a risk of 12-16% of a major cardiac event.  The patient's chest pain is likely to be atypical as the troponin and ekg are negative of any positive cad findings. The patient's presentation also seems atypical for ischemic cause of the chest pain.    -Morphine given for pain -pending d-dimer -trend troponin -gave pantoprazole 40mg  qd -0.9% nacl 23ml q12hrs -continue aspirin 81mg  -started lovenox 40mg  qd -continue cardiac monitoring  Hypertension The patient's bp over the past 24hrs has ranged 128-185/73-93.  -continue losartan 50mg  qd and hctz 12.5mg  qd  Hypothyroidism -continue levothyroxine 100mg  qd   Dispo: Admit patient to Inpatient with expected length of stay greater than 2 midnights.  Signed: Lars Mage, MD Internal Medicine PGY1 Pager:[phone] 10/12/2017, 6:52 PM

## 2017-10-12 NOTE — ED Provider Notes (Signed)
[facility] EMERGENCY DEPARTMENT Provider Note   CSN: [id number] Arrival date & time: 10/12/17  1304     History   Chief Complaint Chief Complaint  Patient presents with  . Chest Pain  . Shortness of Breath    HPI Emma Bonilla is a 61 y.o. female.  HPI Patient presents to the emergency department with left-sided chest pain that started last night but when she awoke this morning was worsening.  The patient states that she did not take any medications prior to arrival.  The patient states that she has had some shortness of breath intermittently as well.  The patient states the pain is intermittent and will last for 45 minutes or so and then somewhat resolved but does not totally go away.  The patient states that she did notice some positional changes affected the pain.  Patient states that she has seen a cardiologist in the past for valve issues.  The patient denies headache,blurred vision, neck pain, fever, cough, weakness, numbness, dizziness, anorexia, edema, abdominal pain, nausea, vomiting, diarrhea, rash, back pain, dysuria, hematemesis, bloody stool, near syncope, or syncope. Past Medical History:  Diagnosis Date  . Arthritis   . Depression   . GERD (gastroesophageal reflux disease)   . Heart murmur   . Hyperlipidemia   . Hypertension   . Thyroid disease   . TIA (transient ischemic attack)    2010    Patient Active Problem List   Diagnosis Date Noted  . Muscle spasm 07/20/2017  . Numbness and tingling in left arm 07/20/2017  . Iron deficiency anemia 03/27/2017  . Lipoma 02/14/2017  . Hematochezia 02/13/2017  . Pre-operative cardiovascular examination 01/19/2017  . Bartholin gland cyst 11/21/2016  . TIA (transient ischemic attack) 08/21/2015  . Aortic valve stenosis 06/11/2015  . Dental caries 05/09/2013  . Seasonal allergies 04/03/2012  . Hypothyroidism 03/30/2012  . Osteoarthritis 11/07/2011  . Health care maintenance 09/01/2011  .  Essential hypertension 07/25/2011  . Depression 07/25/2011  . GERD (gastroesophageal reflux disease) 07/25/2011  . Tobacco abuse 07/25/2011  . Hyperlipidemia 07/25/2011    Past Surgical History:  Procedure Laterality Date  . DILATION AND CURETTAGE OF UTERUS     30 years ago    OB History    Gravida Para Term Preterm AB Living   3 3 3     3    SAB TAB Ectopic Multiple Live Births                   Home Medications    Prior to Admission medications   Medication Sig Start Date End Date Taking? Authorizing Provider  amitriptyline (ELAVIL) 25 MG tablet TAKE TWO TABLETS BY MOUTH AT BEDTIME 05/13/17  Yes Asencion Partridge, MD  aspirin 81 MG tablet Take 1 tablet (81 mg total) by mouth daily. 09/01/11  Yes Pedro Earls, MD  Biotin 5000 MCG CAPS Take by mouth.   Yes [provider]  calcium-vitamin D (OSCAL WITH D) 500-200 MG-UNIT per tablet Take 1 tablet by mouth daily. Patient taking differently: Take 1 tablet by mouth 2 (two) times daily.  08/08/13  Yes Jones Bales, MD  cetirizine (ZYRTEC) 10 MG tablet Take 1 tablet (10 mg total) by mouth daily. 07/20/17  Yes Welford Roche, MD  Dexlansoprazole 30 MG capsule Take 1 capsule (30 mg total) by mouth daily. 03/27/17  Yes Asencion Partridge, MD  ferrous sulfate 325 (65 FE) MG tablet TAKE 1 TABLET BY MOUTH ONCE DAILY 06/01/17  Yes  Lucious Groves, DO  levothyroxine (SYNTHROID) 100 MCG tablet Take 1 tablet (100 mcg total) by mouth daily. 09/03/17 08/30/18 Yes Santos-Sanchez, Merlene Morse, MD  losartan-hydrochlorothiazide (HYZAAR) 50-12.5 MG tablet Take 1 tablet by mouth daily. Patient taking differently: Take 0.5 tablets by mouth daily.  05/20/17  Yes Axel Filler, MD  Melatonin 10 MG CAPS Take by mouth.   Yes [provider]  Menthol, Topical Analgesic, (STOPAIN) 8 % LIQD Apply 1 application daily as needed topically (knee and ankle pain).   Yes [provider]  metoprolol succinate (TOPROL-XL) 25 MG 24 hr tablet  TAKE ONE-HALF TABLET BY MOUTH ONCE DAILY Patient taking differently: TAKE ONE-HALF TABLET  12.5mg  BY MOUTH ONCE DAILY 01/06/17  Yes Hilty, Nadean Corwin, MD  mometasone (NASONEX) 50 MCG/ACT nasal spray Place 2 sprays into the nose daily as needed. 02/13/17 02/09/18 Yes Asencion Partridge, MD  Olopatadine HCl 0.2 % SOLN Apply 1 drop to eye daily as needed. 03/12/17  Yes Asencion Partridge, MD  Pitavastatin Calcium (LIVALO) 2 MG TABS Take 1 tablet (2 mg total) by mouth daily at 6 (six) AM. 06/25/17  Yes Sid Falcon, MD  meloxicam (MOBIC) 15 MG tablet Take 1 tablet (15 mg total) by mouth daily as needed for pain. Patient not taking: Reported on 10/12/2017 02/13/17   Asencion Partridge, MD    Family History Family History  Problem Relation Age of Onset  . Heart disease Mother        CHF  . Hypertension Mother   . Emphysema Mother   . Cancer Mother        lung  . Heart disease Father   . Hypertension Father   . Crohn's disease Father   . Heart disease Sister        LVAD (2014)  . Hypertension Sister   . Colon cancer Sister        58 Mets  . Kidney cancer Sister   . Skin cancer Maternal Grandmother   . Stroke Maternal Grandfather   . Diabetes Brother   . Valvular heart disease Sister        leaky valve  . Hypertension Sister   . Hyperlipidemia Sister   . Hypertension Sister   . Crohn's disease Sister   . Hypertension Child   . Hyperlipidemia Child   . Hyperlipidemia Child     Social History Social History   Tobacco Use  . Smoking status: Current Every Day Smoker    Packs/day: 0.50    Years: 45.00    Pack years: 22.50    Types: Cigarettes  . Smokeless tobacco: Never Used  . Tobacco comment: 1/2 pack or less  Substance Use Topics  . Alcohol use: Yes    Comment: holidays and special occasion  . Drug use: Yes    Frequency: 1.0 times per week    Types: Marijuana    Comment: once weekly or once every 2 weeks     Allergies   Naproxen sodium   Review of Systems Review of Systems All  other systems negative except as documented in the HPI. All pertinent positives and negatives as reviewed in the HPI.  Physical Exam Updated Vital Signs BP (!) 154/108 (BP Location: Right Arm)   Pulse 86   Temp 97.7 F (36.5 C) (Oral)   Resp 16   LMP 02/11/2011 Comment: no chance of pregnancy per pt  SpO2 98%   Physical Exam  Constitutional: She is oriented to person, place, and time. She appears well-developed  and well-nourished. No distress.  HENT:  Head: Normocephalic and atraumatic.  Mouth/Throat: Oropharynx is clear and moist.  Eyes: Pupils are equal, round, and reactive to light.  Neck: Normal range of motion. Neck supple.  Cardiovascular: Normal rate, regular rhythm and normal heart sounds. Exam reveals no gallop and no friction rub.  No murmur heard. Pulmonary/Chest: Effort normal and breath sounds normal. No respiratory distress. She has no decreased breath sounds. She has no wheezes. She has no rhonchi. She has no rales.  Abdominal: Soft. Bowel sounds are normal. She exhibits no distension. There is no tenderness.  Neurological: She is alert and oriented to person, place, and time. She exhibits normal muscle tone. Coordination normal.  Skin: Skin is warm and dry. Capillary refill takes less than 2 seconds. No rash noted. No erythema.  Psychiatric: She has a normal mood and affect. Her behavior is normal.  Nursing note and vitals reviewed.    ED Treatments / Results  Labs (all labs ordered are listed, but only abnormal results are displayed) Labs Reviewed  BASIC METABOLIC PANEL - Abnormal; Notable for the following components:      Result Value   Creatinine, Ser 1.03 (*)    GFR calc non Af Amer 57 (*)    All other components within normal limits  CBC - Abnormal; Notable for the following components:   RBC 5.12 (*)    Hemoglobin 15.7 (*)    HCT 46.8 (*)    All other components within normal limits  D-DIMER, QUANTITATIVE (NOT AT [facility])  I-STAT TROPONIN, ED  I-STAT  TROPONIN, ED    EKG  EKG Interpretation  Date/Time:  Monday October 12 2017 13:14:10 EST Ventricular Rate:  78 PR Interval:  178 QRS Duration: 82 QT Interval:  396 QTC Calculation: 451 R Axis:     Text Interpretation:  Normal sinus rhythm Low voltage QRS Borderline ECG No old tracing to compare Confirmed by Noemi Chapel [phone]) on 10/12/2017 3:40:55 PM       Radiology Dg Chest 2 View  Result Date: 10/12/2017 CLINICAL DATA:  Chest pain EXAM: CHEST  2 VIEW COMPARISON:  None. FINDINGS: The heart size and mediastinal contours are within normal limits. Both lungs are clear. The visualized skeletal structures are unremarkable. IMPRESSION: No active cardiopulmonary disease. Electronically Signed   By: Franchot Gallo M.D.   On: 10/12/2017 13:45    Procedures Procedures (including critical care time)  Medications Ordered in ED Medications - No data to display   Initial Impression / Assessment and Plan / ED Course  I have reviewed the triage vital signs and the nursing notes.  Pertinent labs & imaging results that were available during my care of the patient were reviewed by me and considered in my medical decision making (see chart for details).     Patient will need a chest pain rule out based the fact that she is having intermittent pain with shortness of breath she has never had any significant cardiac workup in the past.  I will speak with the internal medicine residents about admission for the patient.  The patient states she took aspirin this morning.  Final Clinical Impressions(s) / ED Diagnoses   Final diagnoses:  None    ED Discharge Orders    None       Dalia Heading, PA-C 10/12/17 1616    Noemi Chapel, MD 10/13/17 1018

## 2017-10-12 NOTE — ED Triage Notes (Signed)
Pt to ER for evaluation of left chest tightness radiating to back onset this morning on awakening. Denies cardiac hx. States large family hx though. Dyspneic at rest and exertion. States when she began having pain this morning she vomited x1.

## 2017-10-12 NOTE — ED Notes (Signed)
Pt reports no chest pain at this time.  

## 2017-10-12 NOTE — ED Provider Notes (Signed)
The patient is a 61 year old female, she has a known history of hypertension, she still smokes half a pack of cigarettes per day, she has had 2 mini strokes and states that she has a family history significant for cardiac disease in multiple family members.  Chest pain started on the left side last night, it is a heavy and shooting pain that goes from her back to her chest, it is not made worse with position or breathing, it is not made worse with eating, she did get nauseated and vomit one time yesterday.  She has not been diaphoretic, denies swelling of her legs or recent travel, denies coughing or fevers out of the ordinary amount of coughing.  On exam the patient has no acute findings including normal heart rate, normal heart sounds, normal lung sounds, no edema.  Her EKG is nonischemic, troponin is negative, the patient would likely benefit from an inpatient admission for a cardiac rule out as she has ongoing intermittent pain.  Of note the patient does report that her pain is intermittent lasting between 10 and 45 minutes at a time, nothing seems to make it come on or go away.  She has never had pain like this in the past.  She does see Dr. Mali Hilty for a cardiologist for her hx of Aortic Valve issues.   EKG Interpretation  Date/Time:  Monday October 12 2017 13:14:10 EST Ventricular Rate:  78 PR Interval:  178 QRS Duration: 82 QT Interval:  396 QTC Calculation: 451 R Axis:     Text Interpretation:  Normal sinus rhythm Low voltage QRS Borderline ECG No old tracing to compare Confirmed by Noemi Chapel [phone]) on 10/12/2017 3:40:55 PM        Medical screening examination/treatment/procedure(s) were conducted as a shared visit with non-physician practitioner(s) and myself.  I personally evaluated the patient during the encounter.  Clinical Impression:   Final diagnoses:  Precordial pain  Shortness of breath         Noemi Chapel, MD 10/13/17 1018

## 2017-10-12 NOTE — ED Notes (Signed)
Pt chest pain 3/10 at this time. Will reassess

## 2017-10-13 ENCOUNTER — Encounter (HOSPITAL_COMMUNITY): Payer: Self-pay | Admitting: Physician Assistant

## 2017-10-13 ENCOUNTER — Encounter (HOSPITAL_COMMUNITY)
Admission: EM | Disposition: A | Discharge status: Home / Self Care | Payer: Self-pay | Source: Home / Self Care | Attending: Internal Medicine

## 2017-10-13 DIAGNOSIS — R072 Precordial pain: Secondary | ICD-10-CM

## 2017-10-13 DIAGNOSIS — I10 Essential (primary) hypertension: Secondary | ICD-10-CM

## 2017-10-13 DIAGNOSIS — I2510 Atherosclerotic heart disease of native coronary artery without angina pectoris: Secondary | ICD-10-CM

## 2017-10-13 DIAGNOSIS — R0602 Shortness of breath: Secondary | ICD-10-CM

## 2017-10-13 DIAGNOSIS — E782 Mixed hyperlipidemia: Secondary | ICD-10-CM

## 2017-10-13 DIAGNOSIS — Z720 Tobacco use: Secondary | ICD-10-CM

## 2017-10-13 DIAGNOSIS — I350 Nonrheumatic aortic (valve) stenosis: Secondary | ICD-10-CM

## 2017-10-13 DIAGNOSIS — R0600 Dyspnea, unspecified: Secondary | ICD-10-CM

## 2017-10-13 DIAGNOSIS — E669 Obesity, unspecified: Secondary | ICD-10-CM

## 2017-10-13 DIAGNOSIS — I200 Unstable angina: Secondary | ICD-10-CM

## 2017-10-13 DIAGNOSIS — R0609 Other forms of dyspnea: Secondary | ICD-10-CM

## 2017-10-13 HISTORY — PX: LEFT HEART CATH AND CORONARY ANGIOGRAPHY: CATH118249

## 2017-10-13 HISTORY — PX: CORONARY STENT INTERVENTION: CATH118234

## 2017-10-13 LAB — COMPREHENSIVE METABOLIC PANEL
ALBUMIN: 3.2 g/dL — AB (ref 3.5–5.0)
ALK PHOS: 86 U/L (ref 38–126)
ALT: 15 U/L (ref 14–54)
AST: 18 U/L (ref 15–41)
Anion gap: 7 (ref 5–15)
BILIRUBIN TOTAL: 0.8 mg/dL (ref 0.3–1.2)
BUN: 9 mg/dL (ref 6–20)
CO2: 27 mmol/L (ref 22–32)
CREATININE: 0.96 mg/dL (ref 0.44–1.00)
Calcium: 8.6 mg/dL — ABNORMAL LOW (ref 8.9–10.3)
Chloride: 103 mmol/L (ref 101–111)
GFR calc Af Amer: >60 mL/min (ref >60)
GLUCOSE: 86 mg/dL (ref 65–99)
POTASSIUM: 3.2 mmol/L — AB (ref 3.5–5.1)
Sodium: 137 mmol/L (ref 135–145)
TOTAL PROTEIN: 6.4 g/dL — AB (ref 6.5–8.1)

## 2017-10-13 LAB — CBC
HEMATOCRIT: 44.2 % (ref 36.0–46.0)
HEMOGLOBIN: 14.6 g/dL (ref 12.0–15.0)
MCH: 30.2 pg (ref 26.0–34.0)
MCHC: 33 g/dL (ref 30.0–36.0)
MCV: 91.3 fL (ref 78.0–100.0)
Platelets: 286 10*3/uL (ref 150–400)
RBC: 4.84 MIL/uL (ref 3.87–5.11)
RDW: 14.4 % (ref 11.5–15.5)
WBC: 6.3 10*3/uL (ref 4.0–10.5)

## 2017-10-13 LAB — PROTIME-INR
INR: 1.02
PROTHROMBIN TIME: 13.3 s (ref 11.4–15.2)

## 2017-10-13 LAB — TROPONIN I: Troponin I: <0.03 ng/mL (ref <0.03)

## 2017-10-13 LAB — POCT ACTIVATED CLOTTING TIME: ACTIVATED CLOTTING TIME: 307 s

## 2017-10-13 LAB — HIV ANTIBODY (ROUTINE TESTING W REFLEX): HIV SCREEN 4TH GENERATION: NONREACTIVE

## 2017-10-13 LAB — GLUCOSE, CAPILLARY: GLUCOSE-CAPILLARY: 84 mg/dL (ref 65–99)

## 2017-10-13 SURGERY — LEFT HEART CATH AND CORONARY ANGIOGRAPHY
Anesthesia: LOCAL

## 2017-10-13 MED ORDER — IOPAMIDOL (ISOVUE-370) INJECTION 76%
INTRAVENOUS | Status: AC
  Filled 2017-10-13: qty 50

## 2017-10-13 MED ORDER — IOPAMIDOL (ISOVUE-370) INJECTION 76%
INTRAVENOUS | Status: DC | PRN
  Administered 2017-10-13: 130 mL via INTRA_ARTERIAL
  Administered 2017-10-13: 150 mL via INTRA_ARTERIAL

## 2017-10-13 MED ORDER — NITROGLYCERIN IN D5W 200-5 MCG/ML-% IV SOLN: 5.0000 ug/min | INTRAVENOUS | Status: DC

## 2017-10-13 MED ORDER — NITROGLYCERIN IN D5W 200-5 MCG/ML-% IV SOLN
5.0000 ug/min | INTRAVENOUS | Status: DC
  Administered 2017-10-13: 5 ug/min via INTRAVENOUS
  Filled 2017-10-13: qty 250

## 2017-10-13 MED ORDER — HEPARIN (PORCINE) IN NACL 2-0.9 UNIT/ML-% IJ SOLN
INTRAMUSCULAR | Status: AC
  Filled 2017-10-13: qty 500

## 2017-10-13 MED ORDER — HYDRALAZINE HCL 20 MG/ML IJ SOLN: 5.0000 mg | INTRAMUSCULAR | Status: AC | PRN

## 2017-10-13 MED ORDER — HEPARIN (PORCINE) IN NACL 100-0.45 UNIT/ML-% IJ SOLN
1000.0000 [IU]/h | INTRAMUSCULAR | Status: DC
  Administered 2017-10-13: 1000 [IU]/h via INTRAVENOUS
  Filled 2017-10-13: qty 250

## 2017-10-13 MED ORDER — CLOPIDOGREL BISULFATE 75 MG PO TABS
75.0000 mg | ORAL_TABLET | Freq: Every day | ORAL | Status: DC
  Administered 2017-10-14: 75 mg via ORAL
  Filled 2017-10-13: qty 1

## 2017-10-13 MED ORDER — ONDANSETRON HCL 4 MG/2ML IJ SOLN: 4.0000 mg | Freq: Four times a day (QID) | INTRAMUSCULAR | Status: DC | PRN

## 2017-10-13 MED ORDER — LIDOCAINE HCL (PF) 1 % IJ SOLN
INTRAMUSCULAR | Status: AC
  Filled 2017-10-13: qty 30

## 2017-10-13 MED ORDER — SODIUM CHLORIDE 0.9% FLUSH
3.0000 mL | Freq: Two times a day (BID) | INTRAVENOUS | Status: DC
  Administered 2017-10-13: 3 mL via INTRAVENOUS

## 2017-10-13 MED ORDER — FAMOTIDINE IN NACL 20-0.9 MG/50ML-% IV SOLN
INTRAVENOUS | Status: AC
  Filled 2017-10-13: qty 50

## 2017-10-13 MED ORDER — SODIUM CHLORIDE 0.9% FLUSH: 3.0000 mL | INTRAVENOUS | Status: DC | PRN

## 2017-10-13 MED ORDER — MIDAZOLAM HCL 2 MG/2ML IJ SOLN
INTRAMUSCULAR | Status: DC | PRN
  Administered 2017-10-13: 2 mg via INTRAVENOUS

## 2017-10-13 MED ORDER — HEPARIN SODIUM (PORCINE) 1000 UNIT/ML IJ SOLN
INTRAMUSCULAR | Status: DC | PRN
  Administered 2017-10-13: 6000 [IU] via INTRAVENOUS
  Administered 2017-10-13: 5500 [IU] via INTRAVENOUS

## 2017-10-13 MED ORDER — SODIUM CHLORIDE 0.9 % WEIGHT BASED INFUSION: 1.0000 mL/kg/h | INTRAVENOUS | Status: DC

## 2017-10-13 MED ORDER — ACETAMINOPHEN 325 MG PO TABS
650.0000 mg | ORAL_TABLET | ORAL | Status: DC | PRN
  Administered 2017-10-13 – 2017-10-14 (×2): 650 mg via ORAL
  Filled 2017-10-13 (×2): qty 2

## 2017-10-13 MED ORDER — POTASSIUM CHLORIDE CRYS ER 20 MEQ PO TBCR
40.0000 meq | EXTENDED_RELEASE_TABLET | Freq: Once | ORAL | Status: AC
  Administered 2017-10-13: 40 meq via ORAL
  Filled 2017-10-13: qty 2

## 2017-10-13 MED ORDER — CLOPIDOGREL BISULFATE 300 MG PO TABS
ORAL_TABLET | ORAL | Status: AC
  Filled 2017-10-13: qty 1

## 2017-10-13 MED ORDER — HEPARIN BOLUS VIA INFUSION
4000.0000 [IU] | Freq: Once | INTRAVENOUS | Status: AC
  Administered 2017-10-13: 4000 [IU] via INTRAVENOUS
  Filled 2017-10-13: qty 4000

## 2017-10-13 MED ORDER — MIDAZOLAM HCL 2 MG/2ML IJ SOLN
INTRAMUSCULAR | Status: AC
  Filled 2017-10-13: qty 2

## 2017-10-13 MED ORDER — HEPARIN (PORCINE) IN NACL 2-0.9 UNIT/ML-% IJ SOLN
INTRAMUSCULAR | Status: DC | PRN
  Administered 2017-10-13: 10 mL via INTRA_ARTERIAL

## 2017-10-13 MED ORDER — SODIUM CHLORIDE 0.9 % IV SOLN: 250.0000 mL | INTRAVENOUS | Status: DC | PRN

## 2017-10-13 MED ORDER — LIDOCAINE HCL (PF) 1 % IJ SOLN
INTRAMUSCULAR | Status: DC | PRN
  Administered 2017-10-13: 3 mL

## 2017-10-13 MED ORDER — SODIUM CHLORIDE 0.9% FLUSH: 3.0000 mL | Freq: Two times a day (BID) | INTRAVENOUS | Status: DC

## 2017-10-13 MED ORDER — ANGIOPLASTY BOOK
Freq: Once | Status: DC
  Filled 2017-10-13: qty 1

## 2017-10-13 MED ORDER — VERAPAMIL HCL 2.5 MG/ML IV SOLN
INTRAVENOUS | Status: AC
  Filled 2017-10-13: qty 2

## 2017-10-13 MED ORDER — HEPARIN SODIUM (PORCINE) 1000 UNIT/ML IJ SOLN
INTRAMUSCULAR | Status: AC
  Filled 2017-10-13: qty 1

## 2017-10-13 MED ORDER — SODIUM CHLORIDE 0.9 % WEIGHT BASED INFUSION: 3.0000 mL/kg/h | INTRAVENOUS | Status: DC

## 2017-10-13 MED ORDER — PRAVASTATIN SODIUM 40 MG PO TABS: 40.0000 mg | ORAL_TABLET | Freq: Every day | ORAL | Status: DC

## 2017-10-13 MED ORDER — CLOPIDOGREL BISULFATE 300 MG PO TABS
ORAL_TABLET | ORAL | Status: DC | PRN
  Administered 2017-10-13: 600 mg via ORAL

## 2017-10-13 MED ORDER — MORPHINE SULFATE (PF) 4 MG/ML IV SOLN: 2.0000 mg | INTRAVENOUS | Status: DC | PRN

## 2017-10-13 MED ORDER — LABETALOL HCL 5 MG/ML IV SOLN: 10.0000 mg | INTRAVENOUS | Status: AC | PRN

## 2017-10-13 MED ORDER — HEPARIN (PORCINE) IN NACL 2-0.9 UNIT/ML-% IJ SOLN
INTRAMUSCULAR | Status: AC | PRN
  Administered 2017-10-13: 1000 mL

## 2017-10-13 MED ORDER — FENTANYL CITRATE (PF) 100 MCG/2ML IJ SOLN
INTRAMUSCULAR | Status: DC | PRN
  Administered 2017-10-13 (×2): 25 ug via INTRAVENOUS

## 2017-10-13 MED ORDER — FENTANYL CITRATE (PF) 100 MCG/2ML IJ SOLN
INTRAMUSCULAR | Status: AC
  Filled 2017-10-13: qty 2

## 2017-10-13 MED ORDER — SODIUM CHLORIDE 0.9 % IV SOLN: INTRAVENOUS | Status: AC

## 2017-10-13 MED ORDER — FAMOTIDINE IN NACL 20-0.9 MG/50ML-% IV SOLN
INTRAVENOUS | Status: AC | PRN
  Administered 2017-10-13: 20 mg via INTRAVENOUS

## 2017-10-13 SURGICAL SUPPLY — 19 items
BALLN EUPHORA RX 2.5X20 (BALLOONS) ×2
BALLN ~~LOC~~ EMERGE MR 3.5X15 (BALLOONS) ×2
BALLOON EUPHORA RX 2.5X20 (BALLOONS) IMPLANT
BALLOON ~~LOC~~ EMERGE MR 3.5X15 (BALLOONS) IMPLANT
CATH INFINITI 5FR ANG PIGTAIL (CATHETERS) ×1 IMPLANT
CATH OPTITORQUE TIG 4.0 5F (CATHETERS) ×1 IMPLANT
CATH VISTA GUIDE 6FR 3DRC (CATHETERS) ×1 IMPLANT
DEVICE RAD COMP TR BAND LRG (VASCULAR PRODUCTS) ×1 IMPLANT
GLIDESHEATH SLEND A-KIT 6F 22G (SHEATH) ×1 IMPLANT
GUIDEWIRE INQWIRE 1.5J.035X260 (WIRE) IMPLANT
INQWIRE 1.5J .035X260CM (WIRE) ×2
KIT ENCORE 26 ADVANTAGE (KITS) ×1 IMPLANT
KIT HEART LEFT (KITS) ×2 IMPLANT
PACK CARDIAC CATHETERIZATION (CUSTOM PROCEDURE TRAY) ×2 IMPLANT
STENT SIERRA 3.00 X 38 MM (Permanent Stent) ×1 IMPLANT
STENT SIERRA 3.25 X 08 MM (Permanent Stent) ×1 IMPLANT
TRANSDUCER W/STOPCOCK (MISCELLANEOUS) ×2 IMPLANT
TUBING CIL FLEX 10 FLL-RA (TUBING) ×2 IMPLANT
WIRE MARVEL STR TIP 190CM (WIRE) ×1 IMPLANT

## 2017-10-13 NOTE — Progress Notes (Signed)
TR BAND REMOVAL  LOCATION:    R radial  DEFLATED PER PROTOCOL:    YES  TIME BAND OFF / DRESSING APPLIED:    21:00  SITE BEFORE BAND REMOVAL  Level 0  SITE AFTER BAND REMOVAL:    Level  0  CIRCULATION SENSATION AND MOVEMENT:    Within Normal Limits   YES  COMMENTS:   Pt tolerated procedure well. Post band removal instructions given to patient and her daughter. VSS. RUE elevated on pillow. Will continue to monitor site throughout the night.

## 2017-10-13 NOTE — Progress Notes (Signed)
ANTICOAGULATION CONSULT NOTE - Initial Consult  Pharmacy Consult for heparin Indication: chest pain/ACS  Patient Measurements: Height: 5\' 7"  (170.2 cm) Weight: 232 lb 4.8 oz (105.4 kg)(scale b) IBW/kg (Calculated) : 61.6 Heparin Dosing Weight: 85.5 kg  Labs: Recent Labs    10/12/17 1317 10/12/17 1747 10/12/17 2203 10/13/17 0523 10/13/17 1233  HGB 15.7*  --   --  14.6  --   HCT 46.8*  --   --  44.2  --   PLT 321  --   --  286  --   LABPROT  --   --   --   --  13.3  INR  --   --   --   --  1.02  CREATININE 1.03*  --   --  0.96  --   TROPONINI  --  <0.03 <0.03 <0.03  --    Assessment: **Late Entry** Emma Bonilla is a 61 y/o F who presented with chest pain. No significant EKG changes were found and troponins have been negative, however she has continued to have intermittent episodes of chest pain, so heparin was started for ACS r/o. CBC has been stable.  After the heparin was initiated, she was transported to the cath lab to undergo cardiac catheterization and potential intervention. Pharmacy will follow for anticoagulation plans post-procedure.  Goal of Therapy:  Heparin level 0.3-0.7 units/ml Monitor platelets by anticoagulation protocol: Yes   Plan:  Follow for anticoagulation plans post procedure  Prior to procedure, she was given a 4000 unit bolus followed by a 1000 unit/hr infusion.   Patterson Hammersmith PharmD PGY1 Pharmacy Practice Resident 10/13/2017 4:12 PM Pager: [phone]

## 2017-10-13 NOTE — H&P (View-Only) (Signed)
The patient has been seen in conjunction with Melina Copa, PA-C. All aspects of care have been considered and discussed. The patient has been personally interviewed, examined, and all clinical data has been reviewed.   Compelling history of recurring chest tightness with minimal activity in the setting of multiple risk factors including tobacco use, hypertension, hyperlipidemia, prior TIA, and strong family history of vascular disease.  She is obese.  Blood pressure has been elevated.  No gallop is heard.  Neck veins are flat.  Chest is clear.  New Q waves in the inferior leads since February.  Cardiac markers are negative.  Recommend coronary angiography in the setting with history of release moderate probability for ischemic pain/CAD. The patient was counseled to undergo left heart catheterization, coronary angiography, and possible percutaneous coronary intervention with stent implantation. The procedural risks and benefits were discussed in detail. The risks discussed included death, stroke, myocardial infarction, life-threatening bleeding, limb ischemia, kidney injury, allergy, and possible emergency cardiac surgery. The risk of these significant complications were estimated to occur less than 1% of the time. After discussion, the patient has agreed to proceed.   Cardiology Consultation:   Patient ID: Emma Bonilla; [id number]; 1956/02/22   Admit date: 10/12/2017 Date of Consult: 10/13/2017  Primary Care Provider: Welford Roche, MD Primary Cardiologist: Dr. Debara Pickett  Chief Complaint: chest pain  Patient Profile:   Emma Bonilla is a 61 y.o. female with a hx of HTN, HLD, TIA 2010, longstanding tobacco abuse, thyroid disease, mild aortic stenosis/regurgitation by echo 01/2017, PACs/PVCs by prior event monitor 10/2015, possible hepatic steatosis by [place] 2017, GERD, arthritis who is being seen today for the evaluation of chest pain at the request of Dr. Tiburcio Pea.   History of  Present Illness:   She has been followed by Dr. Debara Pickett in the past for occasional palpitations with event monitor in 2016 showing NSR with occasional PACs and PVCs. Remote echo showed aortic stenosis with most recent update in 01/2017 showing moderate LVH, EF 60-65%, grade 1 DD, mild AS/AI, otherwise no acute changes.  She does not exercise but states she watches a 84-year-old grandchild who is very active, so she's on the move a lot and has not had any recent functional limitation or any other unusual symptoms. However, on Sunday evening while watching TV, she developed a sensation of sharp chest tightness and vomited once. The symptoms only lasted a few minutes so she went onto bed as usual. Yesterday (Monday), she awoke abruptly at 6am with similar chest pain over her left chest with radiation to her back. She sat up in bed and tried to get comfortable but the discomfort persisted for about an hour regardless of what position she laid in. The discomfort eased off on its own but then recurred spontaneously again without provocation around 9:30am. At that point she contacted family who urged her to seek care in the ED. She was still having mild pain upon arrival but this eased spontaneously. She had not received any NTG. She did get a dose of morphine at 1641 for recurrent discomfort which completely eased pain. She's continued to have paroxysms of the discomfort through this AM. She says she is able to tell when it is about to come on under her left breast because she will break out into a sweat. She is currently comfortable but it does come back fairly frequently. She does not recall similar symptoms before. Labs show neg troponin x 4, K 3.7->3.2, albumin 3.2, Hgb 14.6,  d-dimer 0.59. CXR without acute CP disease. EKG NSR with nonspecific ST-T changes but no glaringly obvious signs of ischemia during episodes of pain. No LEE, syncope, palpitations. VSS except she was hypertensive on arrival to peak 185/93, most  recent value 132/86.   She has very significant family history of heart disease. One sister had MI in her 1s and had an LVAD placed 4 years ago. Another sister died of MI in her sleep at age 6. Father died of MI at 38. Mother had CHF.   Past Medical History:  Diagnosis Date  . Arthritis   . Depression   . Diastolic dysfunction without heart failure   . GERD (gastroesophageal reflux disease)   . Hepatic steatosis    a. seen on prior US.  Marland Kitchen Hyperlipidemia   . Hypertension   . Mild aortic regurgitation   . Mild aortic stenosis   . Premature atrial contractions   . PVC's (premature ventricular contractions)   . Thyroid disease   . TIA (transient ischemic attack)    2010  . Tobacco abuse     Past Surgical History:  Procedure Laterality Date  . DILATION AND CURETTAGE OF UTERUS     30 years ago     Inpatient Medications: Scheduled Meds: . amitriptyline  50 mg Oral QHS  . aspirin EC  81 mg Oral Daily  . enoxaparin (LOVENOX) injection  40 mg Subcutaneous Q24H  . fluticasone  1 spray Each Nare Daily  . losartan  50 mg Oral Daily   And  . hydrochlorothiazide  12.5 mg Oral Daily  . Influenza vac split quadrivalent PF  0.5 mL Intramuscular Tomorrow-1000  . levothyroxine  100 mcg Oral QAC breakfast  . metoprolol succinate  12.5 mg Oral QHS  . nicotine  14 mg Transdermal Daily  . olopatadine  1 drop Both Eyes BID  . pantoprazole  40 mg Oral Daily  . pneumococcal 23 valent vaccine  0.5 mL Intramuscular Tomorrow-1000  . potassium chloride  40 mEq Oral Once  . sodium chloride flush  3 mL Intravenous Q12H   Continuous Infusions:  PRN Meds: acetaminophen **OR** acetaminophen, morphine injection, promethazine, senna-docusate  Home Meds: Prior to Admission medications   Medication Sig Start Date End Date Taking? Authorizing Provider  amitriptyline (ELAVIL) 25 MG tablet TAKE TWO TABLETS BY MOUTH AT BEDTIME 05/13/17  Yes Asencion Partridge, MD  aspirin 81 MG tablet Take 1 tablet (81 mg  total) by mouth daily. 09/01/11  Yes Pedro Earls, MD  Biotin 5000 MCG CAPS Take by mouth.   Yes [provider]  calcium-vitamin D (OSCAL WITH D) 500-200 MG-UNIT per tablet Take 1 tablet by mouth daily. Patient taking differently: Take 1 tablet by mouth 2 (two) times daily.  08/08/13  Yes Jones Bales, MD  cetirizine (ZYRTEC) 10 MG tablet Take 1 tablet (10 mg total) by mouth daily. 07/20/17  Yes Welford Roche, MD  Dexlansoprazole 30 MG capsule Take 1 capsule (30 mg total) by mouth daily. 03/27/17  Yes Asencion Partridge, MD  ferrous sulfate 325 (65 FE) MG tablet TAKE 1 TABLET BY MOUTH ONCE DAILY 06/01/17  Yes Lucious Groves, DO  levothyroxine (SYNTHROID) 100 MCG tablet Take 1 tablet (100 mcg total) by mouth daily. 09/03/17 08/30/18 Yes Santos-Sanchez, Merlene Morse, MD  losartan-hydrochlorothiazide (HYZAAR) 50-12.5 MG tablet Take 1 tablet by mouth daily. Patient taking differently: Take 0.5 tablets by mouth daily.  05/20/17  Yes Axel Filler, MD  Melatonin 10 MG CAPS Take by mouth.  Yes [provider]  Menthol, Topical Analgesic, (STOPAIN) 8 % LIQD Apply 1 application daily as needed topically (knee and ankle pain).   Yes [provider]  metoprolol succinate (TOPROL-XL) 25 MG 24 hr tablet TAKE ONE-HALF TABLET BY MOUTH ONCE DAILY Patient taking differently: TAKE ONE-HALF TABLET  12.5mg  BY MOUTH ONCE DAILY 01/06/17  Yes Hilty, Nadean Corwin, MD  mometasone (NASONEX) 50 MCG/ACT nasal spray Place 2 sprays into the nose daily as needed. 02/13/17 02/09/18 Yes Asencion Partridge, MD  Olopatadine HCl 0.2 % SOLN Apply 1 drop to eye daily as needed. 03/12/17  Yes Asencion Partridge, MD  Pitavastatin Calcium (LIVALO) 2 MG TABS Take 1 tablet (2 mg total) by mouth daily at 6 (six) AM. 06/25/17  Yes Sid Falcon, MD  meloxicam (MOBIC) 15 MG tablet Take 1 tablet (15 mg total) by mouth daily as needed for pain. Patient not taking: Reported on 10/12/2017 02/13/17   Asencion Partridge, MD     Allergies:    Allergies  Allergen Reactions  . Naproxen Sodium Rash    Social History:   Social History   Socioeconomic History  . Marital status: Married    Spouse name: Not on file  . Number of children: 4  . Years of education: 82  . Highest education level: Not on file  Social Needs  . Financial resource strain: Not on file  . Food insecurity - worry: Not on file  . Food insecurity - inability: Not on file  . Transportation needs - medical: Not on file  . Transportation needs - non-medical: Not on file  Occupational History  . Not on file  Tobacco Use  . Smoking status: Current Every Day Smoker    Packs/day: 0.50    Years: 45.00    Pack years: 22.50    Types: Cigarettes  . Smokeless tobacco: Never Used  . Tobacco comment: 1/2 pack or less  Substance and Sexual Activity  . Alcohol use: Yes    Comment: holidays and special occasion  . Drug use: Yes    Frequency: 1.0 times per week    Types: Marijuana    Comment: once weekly or once every 2 weeks  . Sexual activity: Yes    Birth control/protection: None  Other Topics Concern  . Not on file  Social History Narrative   Epworth Sleepiness Scale (08/21/15) = 9    Family History:   The patient's family history includes Cancer in her mother; Colon cancer in her sister; Crohn's disease in her father and sister; Diabetes in her brother; Emphysema in her mother; Heart disease in her father, mother, and sister; Hyperlipidemia in her child, child, and sister; Hypertension in her child, father, mother, sister, sister, and sister; Kidney cancer in her sister; Skin cancer in her maternal grandmother; Stroke in her maternal grandfather; Valvular heart disease in her sister.  ROS:  Please see the history of present illness.  All other ROS reviewed and negative.     Physical Exam/Data:   Vitals:   10/12/17 2025 10/13/17 0047 10/13/17 0534 10/13/17 0859  BP: (!) 158/87 119/82 114/66 132/86  Pulse: 82 77 67 74  Resp: 18  18 18    Temp: 98.6 F (37 C) 98.1 F (36.7 C) 97.7 F (36.5 C)   TempSrc: Oral Oral Oral   SpO2: 94% 99% 93%   Weight:   232 lb 4.8 oz (105.4 kg)   Height:        Intake/Output Summary (Last 24 hours) at 10/13/2017  Many filed at 10/13/2017 0906 Gross per 24 hour  Intake 273 ml  Output 600 ml  Net -327 ml   Filed Weights   10/12/17 1816 10/13/17 0534  Weight: 232 lb 4 oz (105.3 kg) 232 lb 4.8 oz (105.4 kg)   Body mass index is 36.38 kg/m.  General: Well developed, well nourished obese AAF, in no acute distress. Head: Normocephalic, atraumatic, sclera non-icteric, no xanthomas, nares are without discharge.  Neck: Negative for carotid bruits. JVD not elevated. Lungs: Diminished BS throughout without wheezes, rales, or rhonchi. Breathing is unlabored. Heart: RRR with S1 S2. Minimal SEM. No rubs or gallops appreciated. Abdomen: Soft, non-tender, non-distended with normoactive bowel sounds. No hepatomegaly. No rebound/guarding. No obvious abdominal masses. Msk:  Strength and tone appear normal for age. Extremities: No clubbing or cyanosis. No edema.  Distal pedal pulses are 2+ and equal bilaterally. Neuro: Alert and oriented X 3. No facial asymmetry. No focal deficit. Moves all extremities spontaneously. Psych:  Responds to questions appropriately with a normal affect.  EKG:  The EKG was personally reviewed and demonstrates NSR with nonspecific ST-T changes, particularly inferiorly, accentuated from prior in 01/2017 but similar to 05/2016  Relevant CV Studies: As above  Laboratory Data:  Chemistry Recent Labs  Lab 10/12/17 1317 10/13/17 0523  NA 140 137  K 3.7 3.2*  CL 103 103  CO2 28 27  GLUCOSE 92 86  BUN 8 9  CREATININE 1.03* 0.96  CALCIUM 9.2 8.6*  GFRNONAA 57* >60  GFRAA >60 >60  ANIONGAP 9 7    Recent Labs  Lab 10/13/17 0523  PROT 6.4*  ALBUMIN 3.2*  AST 18  ALT 15  ALKPHOS 86  BILITOT 0.8   Hematology Recent Labs  Lab 10/12/17 1317  10/13/17 0523  WBC 6.8 6.3  RBC 5.12* 4.84  HGB 15.7* 14.6  HCT 46.8* 44.2  MCV 91.4 91.3  MCH 30.7 30.2  MCHC 33.5 33.0  RDW 14.4 14.4  PLT 321 286   Cardiac Enzymes Recent Labs  Lab 10/12/17 1747 10/12/17 2203 10/13/17 0523  TROPONINI <0.03 <0.03 <0.03    Recent Labs  Lab 10/12/17 1331  TROPIPOC 0.00     DDimer  Recent Labs  Lab 10/12/17 1828  DDIMER 0.59*    Radiology/Studies:  Dg Chest 2 View  Result Date: 10/12/2017 CLINICAL DATA:  Chest pain EXAM: CHEST  2 VIEW COMPARISON:  None. FINDINGS: The heart size and mediastinal contours are within normal limits. Both lungs are clear. The visualized skeletal structures are unremarkable. IMPRESSION: No active cardiopulmonary disease. Electronically Signed   By: Franchot Gallo M.D.   On: 10/12/2017 13:45    Assessment and Plan:   1. Chest pain concerning for unstable angina - EKG nonacute but suggestive of possible prior inferior infarct. Troponins are negative. Despite these findings, she continues to have intermittent chest pain. In light of her longstanding history of smoking (up to 2 ppd at one point, x 50 years), significant fam hx of CAD, HTN, HLD, obesity, would recommend definitive cath for eval. Her body habitus and central obesity make stress testing and cardiac CT less sensitive for detection of disease. Risks and benefits of cardiac catheterization have been discussed with the patient. These include bleeding, infection, kidney damage, stroke, heart attack, death. The patient understands these risks and is willing to proceed. She reports prior history of claustrophobia (feeling nervous) going into a CT scan in the past when she had her mini stroke but she denies any  specific reaction to the dye itself (had to drink oral contrast and IV contrast). She denies itching, swelling, rash, anaphylaxis, or wheezing. She will be put on add-on board for this afternoon. Will start IV heparin and IV NTG. Check lipids in AM. If CAD  is present, will need adjustment in her statin to high intensity regimen.  2. Ongoing tobacco abuse - motivated to quit - down to 6 cigarettes a day. Counseled on cessation.  3. HTN - currently controlled but need to watch for spikes like on admission.  4. Hypokalemia - may be r/t HCTZ component of BP med. IM managing and has repleted. Labs ordered for AM.   5. Hyperlipidemia - as above.  6. Mild AS/AI - minimal murmur on exam, doubt significant progression but echo can be considered. Will review with MD.  Signed, Charlie Pitter, PA-C  10/13/2017 10:54 AM

## 2017-10-13 NOTE — Progress Notes (Signed)
Pt slept well overnight, had a complain of chest pain 2/10, tylenol got covered, no any complain of SOB, using bed side commode, gait stable, will continue to monitor the patient.

## 2017-10-13 NOTE — Consult Note (Addendum)
The patient has been seen in conjunction with Melina Copa, PA-C. All aspects of care have been considered and discussed. The patient has been personally interviewed, examined, and all clinical data has been reviewed.   Compelling history of recurring chest tightness with minimal activity in the setting of multiple risk factors including tobacco use, hypertension, hyperlipidemia, prior TIA, and strong family history of vascular disease.  She is obese.  Blood pressure has been elevated.  No gallop is heard.  Neck veins are flat.  Chest is clear.  New Q waves in the inferior leads since February.  Cardiac markers are negative.  Recommend coronary angiography in the setting with history of release moderate probability for ischemic pain/CAD. The patient was counseled to undergo left heart catheterization, coronary angiography, and possible percutaneous coronary intervention with stent implantation. The procedural risks and benefits were discussed in detail. The risks discussed included death, stroke, myocardial infarction, life-threatening bleeding, limb ischemia, kidney injury, allergy, and possible emergency cardiac surgery. The risk of these significant complications were estimated to occur less than 1% of the time. After discussion, the patient has agreed to proceed.   Cardiology Consultation:   Patient ID: Emma Bonilla; [id number]; [birth year]   Admit date: 10/12/2017 Date of Consult: 10/13/2017  Primary Care Provider: Welford Roche, MD Primary Cardiologist: Dr. Debara Pickett  Chief Complaint: chest pain  Patient Profile:   Emma Bonilla is a 61 y.o. female with a hx of HTN, HLD, TIA 2010, longstanding tobacco abuse, thyroid disease, mild aortic stenosis/regurgitation by echo 01/2017, PACs/PVCs by prior event monitor 10/2015, possible hepatic steatosis by [place] 2017, GERD, arthritis who is being seen today for the evaluation of chest pain at the request of Dr. Tiburcio Pea.   History of  Present Illness:   She has been followed by Dr. Debara Pickett in the past for occasional palpitations with event monitor in 2016 showing NSR with occasional PACs and PVCs. Remote echo showed aortic stenosis with most recent update in 01/2017 showing moderate LVH, EF 60-65%, grade 1 DD, mild AS/AI, otherwise no acute changes.  She does not exercise but states she watches a 35-year-old grandchild who is very active, so she's on the move a lot and has not had any recent functional limitation or any other unusual symptoms. However, on Sunday evening while watching TV, she developed a sensation of sharp chest tightness and vomited once. The symptoms only lasted a few minutes so she went onto bed as usual. Yesterday (Monday), she awoke abruptly at 6am with similar chest pain over her left chest with radiation to her back. She sat up in bed and tried to get comfortable but the discomfort persisted for about an hour regardless of what position she laid in. The discomfort eased off on its own but then recurred spontaneously again without provocation around 9:30am. At that point she contacted family who urged her to seek care in the ED. She was still having mild pain upon arrival but this eased spontaneously. She had not received any NTG. She did get a dose of morphine at 1641 for recurrent discomfort which completely eased pain. She's continued to have paroxysms of the discomfort through this AM. She says she is able to tell when it is about to come on under her left breast because she will break out into a sweat. She is currently comfortable but it does come back fairly frequently. She does not recall similar symptoms before. Labs show neg troponin x 4, K 3.7->3.2, albumin 3.2, Hgb 14.6,  d-dimer 0.59. CXR without acute CP disease. EKG NSR with nonspecific ST-T changes but no glaringly obvious signs of ischemia during episodes of pain. No LEE, syncope, palpitations. VSS except she was hypertensive on arrival to peak 185/93, most  recent value 132/86.   She has very significant family history of heart disease. One sister had MI in her 17s and had an LVAD placed 4 years ago. Another sister died of MI in her sleep at age 80. Father died of MI at 47. Mother had CHF.   Past Medical History:  Diagnosis Date  . Arthritis   . Depression   . Diastolic dysfunction without heart failure   . GERD (gastroesophageal reflux disease)   . Hepatic steatosis    a. seen on prior US.  Marland Kitchen Hyperlipidemia   . Hypertension   . Mild aortic regurgitation   . Mild aortic stenosis   . Premature atrial contractions   . PVC's (premature ventricular contractions)   . Thyroid disease   . TIA (transient ischemic attack)    2010  . Tobacco abuse     Past Surgical History:  Procedure Laterality Date  . DILATION AND CURETTAGE OF UTERUS     30 years ago     Inpatient Medications: Scheduled Meds: . amitriptyline  50 mg Oral QHS  . aspirin EC  81 mg Oral Daily  . enoxaparin (LOVENOX) injection  40 mg Subcutaneous Q24H  . fluticasone  1 spray Each Nare Daily  . losartan  50 mg Oral Daily   And  . hydrochlorothiazide  12.5 mg Oral Daily  . Influenza vac split quadrivalent PF  0.5 mL Intramuscular Tomorrow-1000  . levothyroxine  100 mcg Oral QAC breakfast  . metoprolol succinate  12.5 mg Oral QHS  . nicotine  14 mg Transdermal Daily  . olopatadine  1 drop Both Eyes BID  . pantoprazole  40 mg Oral Daily  . pneumococcal 23 valent vaccine  0.5 mL Intramuscular Tomorrow-1000  . potassium chloride  40 mEq Oral Once  . sodium chloride flush  3 mL Intravenous Q12H   Continuous Infusions:  PRN Meds: acetaminophen **OR** acetaminophen, morphine injection, promethazine, senna-docusate  Home Meds: Prior to Admission medications   Medication Sig Start Date End Date Taking? Authorizing Provider  amitriptyline (ELAVIL) 25 MG tablet TAKE TWO TABLETS BY MOUTH AT BEDTIME 05/13/17  Yes Asencion Partridge, MD  aspirin 81 MG tablet Take 1 tablet (81 mg  total) by mouth daily. 09/01/11  Yes Pedro Earls, MD  Biotin 5000 MCG CAPS Take by mouth.   Yes [provider]  calcium-vitamin D (OSCAL WITH D) 500-200 MG-UNIT per tablet Take 1 tablet by mouth daily. Patient taking differently: Take 1 tablet by mouth 2 (two) times daily.  08/08/13  Yes Jones Bales, MD  cetirizine (ZYRTEC) 10 MG tablet Take 1 tablet (10 mg total) by mouth daily. 07/20/17  Yes Welford Roche, MD  Dexlansoprazole 30 MG capsule Take 1 capsule (30 mg total) by mouth daily. 03/27/17  Yes Asencion Partridge, MD  ferrous sulfate 325 (65 FE) MG tablet TAKE 1 TABLET BY MOUTH ONCE DAILY 06/01/17  Yes Lucious Groves, DO  levothyroxine (SYNTHROID) 100 MCG tablet Take 1 tablet (100 mcg total) by mouth daily. 09/03/17 08/30/18 Yes Santos-Sanchez, Merlene Morse, MD  losartan-hydrochlorothiazide (HYZAAR) 50-12.5 MG tablet Take 1 tablet by mouth daily. Patient taking differently: Take 0.5 tablets by mouth daily.  05/20/17  Yes Axel Filler, MD  Melatonin 10 MG CAPS Take by mouth.  Yes [provider]  Menthol, Topical Analgesic, (STOPAIN) 8 % LIQD Apply 1 application daily as needed topically (knee and ankle pain).   Yes [provider]  metoprolol succinate (TOPROL-XL) 25 MG 24 hr tablet TAKE ONE-HALF TABLET BY MOUTH ONCE DAILY Patient taking differently: TAKE ONE-HALF TABLET  12.5mg  BY MOUTH ONCE DAILY 01/06/17  Yes Hilty, Nadean Corwin, MD  mometasone (NASONEX) 50 MCG/ACT nasal spray Place 2 sprays into the nose daily as needed. 02/13/17 02/09/18 Yes Asencion Partridge, MD  Olopatadine HCl 0.2 % SOLN Apply 1 drop to eye daily as needed. 03/12/17  Yes Asencion Partridge, MD  Pitavastatin Calcium (LIVALO) 2 MG TABS Take 1 tablet (2 mg total) by mouth daily at 6 (six) AM. 06/25/17  Yes Sid Falcon, MD  meloxicam (MOBIC) 15 MG tablet Take 1 tablet (15 mg total) by mouth daily as needed for pain. Patient not taking: Reported on 10/12/2017 02/13/17   Asencion Partridge, MD     Allergies:    Allergies  Allergen Reactions  . Naproxen Sodium Rash    Social History:   Social History   Socioeconomic History  . Marital status: Married    Spouse name: Not on file  . Number of children: 4  . Years of education: 31  . Highest education level: Not on file  Social Needs  . Financial resource strain: Not on file  . Food insecurity - worry: Not on file  . Food insecurity - inability: Not on file  . Transportation needs - medical: Not on file  . Transportation needs - non-medical: Not on file  Occupational History  . Not on file  Tobacco Use  . Smoking status: Current Every Day Smoker    Packs/day: 0.50    Years: 45.00    Pack years: 22.50    Types: Cigarettes  . Smokeless tobacco: Never Used  . Tobacco comment: 1/2 pack or less  Substance and Sexual Activity  . Alcohol use: Yes    Comment: holidays and special occasion  . Drug use: Yes    Frequency: 1.0 times per week    Types: Marijuana    Comment: once weekly or once every 2 weeks  . Sexual activity: Yes    Birth control/protection: None  Other Topics Concern  . Not on file  Social History Narrative   Epworth Sleepiness Scale (08/21/15) = 9    Family History:   The patient's family history includes Cancer in her mother; Colon cancer in her sister; Crohn's disease in her father and sister; Diabetes in her brother; Emphysema in her mother; Heart disease in her father, mother, and sister; Hyperlipidemia in her child, child, and sister; Hypertension in her child, father, mother, sister, sister, and sister; Kidney cancer in her sister; Skin cancer in her maternal grandmother; Stroke in her maternal grandfather; Valvular heart disease in her sister.  ROS:  Please see the history of present illness.  All other ROS reviewed and negative.     Physical Exam/Data:   Vitals:   10/12/17 2025 10/13/17 0047 10/13/17 0534 10/13/17 0859  BP: (!) 158/87 119/82 114/66 132/86  Pulse: 82 77 67 74  Resp: 18  18 18    Temp: 98.6 F (37 C) 98.1 F (36.7 C) 97.7 F (36.5 C)   TempSrc: Oral Oral Oral   SpO2: 94% 99% 93%   Weight:   232 lb 4.8 oz (105.4 kg)   Height:        Intake/Output Summary (Last 24 hours) at 10/13/2017  [place] filed at 10/13/2017 0906 Gross per 24 hour  Intake 273 ml  Output 600 ml  Net -327 ml   Filed Weights   10/12/17 1816 10/13/17 0534  Weight: 232 lb 4 oz (105.3 kg) 232 lb 4.8 oz (105.4 kg)   Body mass index is 36.38 kg/m.  General: Well developed, well nourished obese AAF, in no acute distress. Head: Normocephalic, atraumatic, sclera non-icteric, no xanthomas, nares are without discharge.  Neck: Negative for carotid bruits. JVD not elevated. Lungs: Diminished BS throughout without wheezes, rales, or rhonchi. Breathing is unlabored. Heart: RRR with S1 S2. Minimal SEM. No rubs or gallops appreciated. Abdomen: Soft, non-tender, non-distended with normoactive bowel sounds. No hepatomegaly. No rebound/guarding. No obvious abdominal masses. Msk:  Strength and tone appear normal for age. Extremities: No clubbing or cyanosis. No edema.  Distal pedal pulses are 2+ and equal bilaterally. Neuro: Alert and oriented X 3. No facial asymmetry. No focal deficit. Moves all extremities spontaneously. Psych:  Responds to questions appropriately with a normal affect.  EKG:  The EKG was personally reviewed and demonstrates NSR with nonspecific ST-T changes, particularly inferiorly, accentuated from prior in 01/2017 but similar to 05/2016  Relevant CV Studies: As above  Laboratory Data:  Chemistry Recent Labs  Lab 10/12/17 1317 10/13/17 0523  NA 140 137  K 3.7 3.2*  CL 103 103  CO2 28 27  GLUCOSE 92 86  BUN 8 9  CREATININE 1.03* 0.96  CALCIUM 9.2 8.6*  GFRNONAA 57* >60  GFRAA >60 >60  ANIONGAP 9 7    Recent Labs  Lab 10/13/17 0523  PROT 6.4*  ALBUMIN 3.2*  AST 18  ALT 15  ALKPHOS 86  BILITOT 0.8   Hematology Recent Labs  Lab 10/12/17 1317  10/13/17 0523  WBC 6.8 6.3  RBC 5.12* 4.84  HGB 15.7* 14.6  HCT 46.8* 44.2  MCV 91.4 91.3  MCH 30.7 30.2  MCHC 33.5 33.0  RDW 14.4 14.4  PLT 321 286   Cardiac Enzymes Recent Labs  Lab 10/12/17 1747 10/12/17 2203 10/13/17 0523  TROPONINI <0.03 <0.03 <0.03    Recent Labs  Lab 10/12/17 1331  TROPIPOC 0.00     DDimer  Recent Labs  Lab 10/12/17 1828  DDIMER 0.59*    Radiology/Studies:  Dg Chest 2 View  Result Date: 10/12/2017 CLINICAL DATA:  Chest pain EXAM: CHEST  2 VIEW COMPARISON:  None. FINDINGS: The heart size and mediastinal contours are within normal limits. Both lungs are clear. The visualized skeletal structures are unremarkable. IMPRESSION: No active cardiopulmonary disease. Electronically Signed   By: Franchot Gallo M.D.   On: 10/12/2017 13:45    Assessment and Plan:   1. Chest pain concerning for unstable angina - EKG nonacute but suggestive of possible prior inferior infarct. Troponins are negative. Despite these findings, she continues to have intermittent chest pain. In light of her longstanding history of smoking (up to 2 ppd at one point, x 50 years), significant fam hx of CAD, HTN, HLD, obesity, would recommend definitive cath for eval. Her body habitus and central obesity make stress testing and cardiac CT less sensitive for detection of disease. Risks and benefits of cardiac catheterization have been discussed with the patient. These include bleeding, infection, kidney damage, stroke, heart attack, death. The patient understands these risks and is willing to proceed. She reports prior history of claustrophobia (feeling nervous) going into a CT scan in the past when she had her mini stroke but she denies any  specific reaction to the dye itself (had to drink oral contrast and IV contrast). She denies itching, swelling, rash, anaphylaxis, or wheezing. She will be put on add-on board for this afternoon. Will start IV heparin and IV NTG. Check lipids in AM. If CAD  is present, will need adjustment in her statin to high intensity regimen.  2. Ongoing tobacco abuse - motivated to quit - down to 6 cigarettes a day. Counseled on cessation.  3. HTN - currently controlled but need to watch for spikes like on admission.  4. Hypokalemia - may be r/t HCTZ component of BP med. IM managing and has repleted. Labs ordered for AM.   5. Hyperlipidemia - as above.  6. Mild AS/AI - minimal murmur on exam, doubt significant progression but echo can be considered. Will review with MD.  Signed, Charlie Pitter, PA-C  10/13/2017 10:54 AM

## 2017-10-13 NOTE — Progress Notes (Signed)
PT Cancellation Note  Patient Details Name: Emma Bonilla MRN: [id number] DOB: 1956-03-11   Cancelled Treatment:    Reason Eval/Treat Not Completed: Patient at procedure or test/unavailable   Shary Decamp [facility] 10/13/2017, 2:51 PM [facility] PT [phone]

## 2017-10-13 NOTE — Progress Notes (Signed)
   Subjective: He was seen sitting in her bed with her daughter at bedside.  She states that she is noticing her chest pain again this morning around 9 AM.  She describes the pain as 3/10 in intensity and she describes it as dull in nature.  Patient denies any accompanying shortness of breath, nausea, vomiting.  Objective:  Vital signs in last 24 hours: Vitals:   10/12/17 2025 10/13/17 0047 10/13/17 0534 10/13/17 0859  BP: (!) 158/87 119/82 114/66 132/86  Pulse: 82 77 67 74  Resp: 18 18 18    Temp: 98.6 F (37 C) 98.1 F (36.7 C) 97.7 F (36.5 C)   TempSrc: Oral Oral Oral   SpO2: 94% 99% 93%   Weight:   232 lb 4.8 oz (105.4 kg)   Height:       Physical Exam  Constitutional: She appears well-developed and well-nourished. No distress.  HENT:  Head: Normocephalic and atraumatic.  No jvp noted  Eyes: Conjunctivae are normal.  Cardiovascular: Normal rate, regular rhythm, normal heart sounds and intact distal pulses.  Pulmonary/Chest: Effort normal and breath sounds normal. No respiratory distress. She has no wheezes.  Abdominal: Soft. Bowel sounds are normal. She exhibits no distension. There is no tenderness.  Musculoskeletal: She exhibits no edema.  Neurological: She is alert.  Skin: She is not diaphoretic.  Psychiatric: She has a normal mood and affect. Her behavior is normal. Judgment and thought content normal.   Assessment/Plan:  Active Problems:   Essential hypertension   Tobacco abuse   Hyperlipidemia   Aortic valve stenosis   Chest pain   Obesity  Chest pain The patient presented with chest pain that started 10/11/2018 that she describes as 10/10 in intensity, occurring intermittently, sharp in nature, non-pleuritic, alleviated by change in positio,  and lasting between 30 minutes to 2.5 hours.  The patient has significant cardiac risk factors of age 54-64, hyperlipidemia, tobacco use, hypertension, obesity, strong family history of heart disease which definitely  increase the patient's risk of a major cardiac event.  The patient's heart score was 4.  -Although the patient's troponin was negative and initial EKG not have any findings of ST or T wave changes however Q waves were noted on the inferior leads.   -Consulted cardiology due to the patient's major risk factors and likelihood that this patient's chest pain is ischemic in nature.  Cardiology saw the patient and recommended left cardiac cath, coronary angiography, possible percutaneous coronary intervention with stent implantation. -D-dimer= 0.59 -Troponin: Less than 0.03 x 3 -Started on IV heparin and NTG -Lipid panel pending to possibly start high intensity statin -pre and post cath 0.9% nacl -Aspirin 325 -Continue pantoprazole 40mg  qd -Continue cardiac monitoring -Continue Lovenox 40mg  qd -N.p.o. except sips with meds  Hypertension The patient's bp over the past 24hrs has ranged 114-185/66-93. -continue losartan 50mg  qd and hctz 12.5mg  qd  Hypokalemia The patient's potassium today was 3.2. -KCl 40 mEq repleted this morning 10/13/2017  Hyperlipidemia -Continue pravastatin 40 mg daily which has been changed from her home pitavastatin 2 mg as that is what is on formulary at [facility]  Hypothyroidism -continue levothyroxine 100mg  qd  Health maintenance -Pneumococcal 23 vaccine -Influenza vaccine   Dispo: Anticipated discharge in approximately 1-2 day(s).   Lars Mage, MD Internal Medicine PGY1 [phone] 10/13/2017, 2:24 PM

## 2017-10-13 NOTE — Interval H&P Note (Signed)
History and Physical Interval Note:  10/13/2017 1:51 PM  Emma Bonilla  has presented today for surgery, with the diagnosis of cp- Unstable Angina.  The various methods of treatment have been discussed with the patient and family. After consideration of risks, benefits and other options for treatment, the patient has consented to  Procedure(s): LEFT HEART CATH AND CORONARY ANGIOGRAPHY (N/A) with possible PERCUTANEOUS CORONARY INTERVENTION as a surgical intervention .  The patient's history has been reviewed, patient examined, no change in status, stable for surgery.  I have reviewed the patient's chart and labs.  Questions were answered to the patient's satisfaction.    Cath Lab Visit (complete for each Cath Lab visit)  Clinical Evaluation Leading to the Procedure:   ACS: Yes.    Non-ACS:    Anginal Classification: CCS III  Anti-ischemic medical therapy: Minimal Therapy (1 class of medications)  Non-Invasive Test Results: No non-invasive testing performed  Prior CABG: No previous CABG   Glenetta Hew

## 2017-10-13 NOTE — Progress Notes (Signed)
Patient stated having chest pain. Notified Internal medicine team. Obtained an EKG and placed on chart. Internal medicine team is going to come see patient.

## 2017-10-14 ENCOUNTER — Telehealth: Payer: Self-pay

## 2017-10-14 ENCOUNTER — Encounter (HOSPITAL_COMMUNITY): Payer: Self-pay | Admitting: Cardiology

## 2017-10-14 DIAGNOSIS — E669 Obesity, unspecified: Secondary | ICD-10-CM

## 2017-10-14 DIAGNOSIS — E785 Hyperlipidemia, unspecified: Secondary | ICD-10-CM

## 2017-10-14 DIAGNOSIS — Z79899 Other long term (current) drug therapy: Secondary | ICD-10-CM

## 2017-10-14 DIAGNOSIS — Z886 Allergy status to analgesic agent status: Secondary | ICD-10-CM

## 2017-10-14 DIAGNOSIS — Z955 Presence of coronary angioplasty implant and graft: Secondary | ICD-10-CM

## 2017-10-14 LAB — BASIC METABOLIC PANEL
ANION GAP: 8 (ref 5–15)
BUN: 7 mg/dL (ref 6–20)
CHLORIDE: 103 mmol/L (ref 101–111)
CO2: 26 mmol/L (ref 22–32)
Calcium: 8.8 mg/dL — ABNORMAL LOW (ref 8.9–10.3)
Creatinine, Ser: 1.02 mg/dL — ABNORMAL HIGH (ref 0.44–1.00)
GFR calc Af Amer: >60 mL/min (ref >60)
GFR, EST NON AFRICAN AMERICAN: 58 mL/min — AB (ref >60)
GLUCOSE: 87 mg/dL (ref 65–99)
POTASSIUM: 3.3 mmol/L — AB (ref 3.5–5.1)
Sodium: 137 mmol/L (ref 135–145)

## 2017-10-14 LAB — CBC
HEMATOCRIT: 45 % (ref 36.0–46.0)
HEMOGLOBIN: 14.9 g/dL (ref 12.0–15.0)
MCH: 30 pg (ref 26.0–34.0)
MCHC: 33.1 g/dL (ref 30.0–36.0)
MCV: 90.5 fL (ref 78.0–100.0)
Platelets: 291 10*3/uL (ref 150–400)
RBC: 4.97 MIL/uL (ref 3.87–5.11)
RDW: 14.1 % (ref 11.5–15.5)
WBC: 7.6 10*3/uL (ref 4.0–10.5)

## 2017-10-14 LAB — LIPID PANEL
Cholesterol: 148 mg/dL (ref 0–200)
HDL: 29 mg/dL — AB (ref >40)
LDL CALC: 95 mg/dL (ref 0–99)
TRIGLYCERIDES: 119 mg/dL (ref <150)
Total CHOL/HDL Ratio: 5.1 RATIO
VLDL: 24 mg/dL (ref 0–40)

## 2017-10-14 MED ORDER — ATORVASTATIN CALCIUM 40 MG PO TABS: 40.0000 mg | ORAL_TABLET | Freq: Every day | ORAL | Status: DC

## 2017-10-14 MED ORDER — METOPROLOL SUCCINATE ER 25 MG PO TB24: 12.5000 mg | ORAL_TABLET | Freq: Every day | ORAL | 2 refills | Status: DC

## 2017-10-14 MED ORDER — PANTOPRAZOLE SODIUM 20 MG PO TBEC: 20.0000 mg | DELAYED_RELEASE_TABLET | Freq: Every day | ORAL | 0 refills | Status: DC

## 2017-10-14 MED ORDER — POTASSIUM CHLORIDE CRYS ER 20 MEQ PO TBCR
40.0000 meq | EXTENDED_RELEASE_TABLET | Freq: Once | ORAL | Status: AC
  Administered 2017-10-14: 40 meq via ORAL
  Filled 2017-10-14: qty 2

## 2017-10-14 MED ORDER — LOSARTAN POTASSIUM-HCTZ 50-12.5 MG PO TABS: 2.0000 | ORAL_TABLET | Freq: Every day | ORAL | 0 refills | Status: DC

## 2017-10-14 MED ORDER — GI COCKTAIL ~~LOC~~
30.0000 mL | Freq: Once | ORAL | Status: AC
  Administered 2017-10-14: 13:00:00 30 mL via ORAL
  Filled 2017-10-14: qty 30

## 2017-10-14 MED ORDER — POTASSIUM CHLORIDE CRYS ER 20 MEQ PO TBCR: 40.0000 meq | EXTENDED_RELEASE_TABLET | Freq: Once | ORAL | Status: DC

## 2017-10-14 MED ORDER — ATORVASTATIN CALCIUM 40 MG PO TABS: 40.0000 mg | ORAL_TABLET | Freq: Every day | ORAL | 0 refills | Status: DC

## 2017-10-14 MED ORDER — LOSARTAN POTASSIUM 50 MG PO TABS
100.0000 mg | ORAL_TABLET | Freq: Every day | ORAL | Status: DC
  Administered 2017-10-14: 10:00:00 100 mg via ORAL

## 2017-10-14 MED ORDER — HYDROCHLOROTHIAZIDE 12.5 MG PO CAPS
12.5000 mg | ORAL_CAPSULE | Freq: Every day | ORAL | Status: DC
Start: 2017-10-14 — End: 2017-10-14
  Administered 2017-10-14: 12.5 mg via ORAL

## 2017-10-14 MED ORDER — CLOPIDOGREL BISULFATE 75 MG PO TABS: 75.0000 mg | ORAL_TABLET | Freq: Every day | ORAL | 0 refills | Status: DC

## 2017-10-14 NOTE — Progress Notes (Signed)
CARDIAC REHAB PHASE I   PRE:  Rate/Rhythm: 94 SR  BP:  Supine: 140/90  Sitting:   Standing:    SaO2: 99%RA  MODE:  Ambulation: 700 ft   POST:  Rate/Rhythm: 109 ST  BP:  Supine: 156/100  Sitting:   Standing:    SaO2: 95%RA 0810-0920 Pt had a little chest soreness that did not intensify with walk. Walked 700 ft on RA with steady gait. Education completed with pt and husband who voiced understanding. Stressed importance of plavix with stent. Discussed NTG use, risk factors, smoking cessation, heart healthy food choices, and ex ed. Gave fake cigarette and smoking cessation handout and encouraged her to call 1800quitnow if needed. Discussed CRP 2 and will refer to [facility]. Pt keeps 61 year old during day so unsure if she would be able to attend.   Graylon Good, RN BSN  10/14/2017 9:17 AM

## 2017-10-14 NOTE — Progress Notes (Signed)
Subjective: Ms. Capps was seen laying this morning with her husband at bedside.  She states that she still has some mild chest pain.  She denies any shortness of breath, nausea, vomiting. She appeared tearful and concerned.  We explained her discharge plan to her and she expressed understanding.  Objective:  Vital signs in last 24 hours: Vitals:   10/14/17 0040 10/14/17 0537 10/14/17 0745 10/14/17 1139  BP: 117/71 (!) 142/85 140/90 (!) 148/99  Pulse: 83 85 80 (!) 51  Resp: 16 (!) 23 17 11   Temp: 98.4 F (36.9 C) 98 F (36.7 C) 98.3 F (36.8 C) 97.8 F (36.6 C)  TempSrc: Oral Oral Oral Oral  SpO2: 98% 98% 94% 100%  Weight:  228 lb 2.8 oz (103.5 kg)    Height:       Physical Exam  Constitutional: She appears well-developed and well-nourished.  Non-toxic appearance. She does not appear ill.  HENT:  Head: Normocephalic and atraumatic.  Cardiovascular: Normal rate and regular rhythm.  Pulmonary/Chest: Effort normal and breath sounds normal. No accessory muscle usage. No respiratory distress.  Abdominal: Soft. Bowel sounds are normal. She exhibits no distension. There is no tenderness.  Neurological: She is alert.  Psychiatric: She has a normal mood and affect. Her behavior is normal. Her mood appears not anxious. She is not agitated.     Assessment/Plan:  Active Problems:   Essential hypertension   Tobacco abuse   Hyperlipidemia   Aortic valve stenosis   Chest pain   Obesity   Shortness of breath   Unstable angina (HCC)   S/P drug eluting coronary stent placement  Chest pain The patient presented with chest pain that started 10/11/2018. The patient's heart score was 4.  Cardiac cath was done yesterday 10/13/17 which showed:  Prox RCA lesion is 70% stenosed. Prox RCA to Mid RCA lesion is 85% stenosed.  A drug-eluting stent was successfully placed using a STENT SIERRA 3.00 X 38 MM. --Postdilated to 3.5 mm  A drug-eluting stent was successfully placed using a  STENT SIERRA 3.25 X 08 MM. Overlapping proximally; postdilated to 3.6 mm  Post intervention, there is a 0% residual stenosis.  Dist RCA lesion is 50% stenosed. RPDA lesion is 60% stenosed.  The left ventricular systolic function is normal -hyperdynamic. The left ventricular ejection fraction is greater than 65% by visual estimate.  LV end diastolic pressure is normal.  There is mild aortic valve stenosis. 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  Long area of stenosis covered with a long stent  with a more focal proximal lesion covered with an overlapping stent proximally.  -Dual antiplatelet with asprin 325mg  and plavix 75mg  for 3 months and subsequently solely Plavix for 1 year and maybe beyond. -Follow-up with cardiology and primary care outpatient -Lipid Panel: Tcholes=148, triglycerides=119, hdl=29, ldl=95;  started atorvastatin 40mg  to get ldl<70, repeat lipid profile in 2-3 months -Cardiac rehab saw patient and found that chest soreness did not intensify with exertion (783ft of walking). Recommended to continue cardiac rehab phase 2.  -Continue pantoprazole 40mg  qd -Continue cardiac monitoring -Continue Lovenox 40mg  qd  Hypertension The patient's bp over the past 24hrs has ranged 98-186/59-103 -Increased losartan to 100mg  qd -Continue hctz 12.5mg  qd and metoprolol 12.5 qhs  Hypokalemia The patient's potassium today was 3.3, previously 3.2 yesterday. -KCl 40 mEq repleted today 10/14/2017  Dispo: Anticipated discharge in approximately today   Lars Mage, MD  Internal Medicine PGY1 QJJHE:[phone] 10/14/2017, 1:22 PM

## 2017-10-14 NOTE — Evaluation (Signed)
Physical Therapy Evaluation Patient Details Name: Emma Bonilla MRN: [id number] DOB: [birth year] Today's Date: 10/14/2017   History of Present Illness  Pt is a 61 y/o female presenting with chest pain concerning for unstable angina. Pt underwent L heart cath on 11/13. PMH including but not limited to HTN, HLD and TIA in 2010.  Clinical Impression  Pt presented supine in bed with HOB elevated, awake and willing to participate in therapy session. Pt's spouse present throughout session as well. Prior to admission, pt reported that she was independent with all functional mobility and ADLs. Pt ambulated in hallway with supervision to min guard for safety without use of an AD. Pt's HR at beginning was in the low 90's, increased to low 110's with ambulation and quickly recovered back to low 90's with sitting rest break. Pt reported no chest pain, dizziness or any unwanted symptoms. Pt would continue to benefit from skilled physical therapy services at this time while admitted and after d/c to address the below listed limitations in order to improve overall safety and independence with functional mobility.     Follow Up Recommendations Other (comment)(OP Cardiac Rehab)    Equipment Recommendations  None recommended by PT    Recommendations for Other Services       Precautions / Restrictions Precautions Precautions: None Restrictions Weight Bearing Restrictions: No      Mobility  Bed Mobility Overal bed mobility: Modified Independent             General bed mobility comments: increased time/effort  Transfers Overall transfer level: Modified independent Equipment used: None             General transfer comment: increased time/effort  Ambulation/Gait Ambulation/Gait assistance: Supervision;Min guard Ambulation Distance (Feet): 500 Feet Assistive device: None Gait Pattern/deviations: Step-through pattern;Decreased stride length Gait velocity: decreased Gait velocity  interpretation: Below normal speed for age/gender General Gait Details: mild instability but no overt LOB or need for physical assistance; supervision to min guard for safety; pt ambulating very close to wall on R side and reported she has 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"  Stairs            Wheelchair Mobility    Modified Rankin (Stroke Patients Only)       Balance Overall balance assessment: Needs assistance Sitting-balance support: Feet supported Sitting balance-Leahy Scale: Good     Standing balance support: During functional activity;No upper extremity supported Standing balance-Leahy Scale: Fair                               Pertinent Vitals/Pain Pain Assessment: No/denies pain    Home Living Family/patient expects to be discharged to:: Private residence Living Arrangements: Spouse/significant other;Other relatives Available Help at Discharge: Family;Available 24 hours/day Type of Home: House Home Access: Stairs to enter Entrance Stairs-Rails: Right;Left;Can reach both Entrance Stairs-Number of Steps: 3 Home Layout: One level Home Equipment: Cane - single point      Prior Function Level of Independence: Independent               Hand Dominance   Dominant Hand: Right    Extremity/Trunk Assessment   Upper Extremity Assessment Upper Extremity Assessment: Overall WFL for tasks assessed    Lower Extremity Assessment Lower Extremity Assessment: Overall WFL for tasks assessed       Communication   Communication: No difficulties  Cognition Arousal/Alertness: Awake/alert Behavior During Therapy: WFL for tasks assessed/performed Overall Cognitive  Status: Within Functional Limits for tasks assessed                                        General Comments      Exercises     Assessment/Plan    PT Assessment Patient needs continued PT services  PT Problem List Decreased balance;Decreased mobility;Decreased  coordination;Cardiopulmonary status limiting activity;Decreased knowledge of precautions       PT Treatment Interventions DME instruction;Gait training;Stair training;Functional mobility training;Therapeutic activities;Balance training;Therapeutic exercise;Neuromuscular re-education;Patient/family education    PT Goals (Current goals can be found in the Care Plan section)  Acute Rehab PT Goals Patient Stated Goal: return home PT Goal Formulation: With patient Time For Goal Achievement: 10/28/17 Potential to Achieve Goals: Good    Frequency Min 3X/week   Barriers to discharge        Co-evaluation               AM-PAC PT "6 Clicks" Daily Activity  Outcome Measure Difficulty turning over in bed (including adjusting bedclothes, sheets and blankets)?: None Difficulty moving from lying on back to sitting on the side of the bed? : None Difficulty sitting down on and standing up from a chair with arms (e.g., wheelchair, bedside commode, etc,.)?: None Help needed moving to and from a bed to chair (including a wheelchair)?: None Help needed walking in hospital room?: None Help needed climbing 3-5 steps with a railing? : A Little 6 Click Score: 23    End of Session Equipment Utilized During Treatment: Gait belt Activity Tolerance: Patient tolerated treatment well Patient left: in bed;with call bell/phone within reach;with family/visitor present Nurse Communication: Mobility status PT Visit Diagnosis: Other abnormalities of gait and mobility (R26.89);Difficulty in walking, not elsewhere classified (R26.2)    Time: 9753-0051 PT Time Calculation (min) (ACUTE ONLY): 18 min   Charges:   PT Evaluation $PT Eval Moderate Complexity: 1 Mod     PT G Codes:        [place], PT, DPT [place] 10/14/2017, 1:51 PM

## 2017-10-14 NOTE — Progress Notes (Signed)
Pt. C/o  Sharp  pain  intermittently lasting few  seconds on left under breast area x 3 post ambulation from BR,  denies abdominal pain , stated "burping " . Vital signs stable .[facility] notified , GI cocktail ordered. Kept patient monitored. Dr Joselyn Glassman also made aware.

## 2017-10-14 NOTE — Discharge Summary (Signed)
Name: Emma Bonilla MRN: [id number] DOB: 1956-08-09 61 y.o. PCP: Welford Roche, MD  Date of Admission: 10/12/2017  1:33 PM Date of Discharge: 10/14/2017 Attending Physician: Aldine Contes, MD  Discharge Diagnosis:  Active Problems:   Essential hypertension   Tobacco abuse   Hyperlipidemia   Aortic valve stenosis   Chest pain   Obesity   Shortness of breath   Unstable angina (HCC)   S/P drug eluting coronary stent placement   Discharge Medications: Allergies as of 10/14/2017      Reactions   Naproxen Sodium Rash      Medication List    STOP taking these medications   meloxicam 15 MG tablet Commonly known as:  MOBIC   Pitavastatin Calcium 2 MG Tabs Commonly known as:  LIVALO     TAKE these medications   amitriptyline 25 MG tablet Commonly known as:  ELAVIL TAKE TWO TABLETS BY MOUTH AT BEDTIME   aspirin 81 MG tablet Take 1 tablet (81 mg total) by mouth daily.   atorvastatin 40 MG tablet Commonly known as:  LIPITOR Take 1 tablet (40 mg total) daily at 6 PM by mouth.   Biotin 5000 MCG Caps Take by mouth.   calcium-vitamin D 500-200 MG-UNIT tablet Commonly known as:  OSCAL WITH D Take 1 tablet by mouth daily. What changed:  when to take this   cetirizine 10 MG tablet Commonly known as:  ZYRTEC Take 1 tablet (10 mg total) by mouth daily.   clopidogrel 75 MG tablet Commonly known as:  PLAVIX Take 1 tablet (75 mg total) daily with breakfast by mouth. Start taking on:  10/15/2017   Dexlansoprazole 30 MG capsule Take 1 capsule (30 mg total) by mouth daily.   ferrous sulfate 325 (65 FE) MG tablet TAKE 1 TABLET BY MOUTH ONCE DAILY   levothyroxine 100 MCG tablet Commonly known as:  SYNTHROID Take 1 tablet (100 mcg total) by mouth daily.   losartan-hydrochlorothiazide 50-12.5 MG tablet Commonly known as:  HYZAAR Take 2 tablets daily by mouth. What changed:  how much to take   Melatonin 10 MG Caps Take by mouth.   metoprolol  succinate 25 MG 24 hr tablet Commonly known as:  TOPROL-XL Take 0.5 tablets (12.5 mg total) daily by mouth. What changed:    how much to take  how to take this  when to take this   mometasone 50 MCG/ACT nasal spray Commonly known as:  NASONEX Place 2 sprays into the nose daily as needed.   Olopatadine HCl 0.2 % Soln Apply 1 drop to eye daily as needed.   STOPAIN 8 % Liqd Generic drug:  Menthol (Topical Analgesic) Apply 1 application daily as needed topically (knee and ankle pain).       Disposition and follow-up:   [name] was discharged from [facility] in Good condition.  At the hospital follow up visit please address:  1.  Continue antiplatelet medication (aspirin and plavix for 3 months and then continue plavix for one year. Please ensure there is follow up with cardiology and primary care.   2.  Labs / imaging needed at time of follow-up: none  3.  Pending labs/ test needing follow-up: none  Follow-up Appointments:   Hospital Course by problem list:  Unstable angina: The patient presented with chest pain that started 10/11/2018 that she describes as 10/10 in intensity, occurring intermittently, sharp in nature, non-pleuritic, alleviated by change in position,  and lasting between 30 minutes to 2.5  hours. The patient states that the chest pain occurs at rest and with exertion. She has a heart score of 4 based on risk factors. Troponin was negative x3,  Initial ekg showed q waves in inferior leads which was new. Cardiology was consulted and they did a coronary angiogram. The angiogram revealed 70% stenosis in the proximal RCA and a drug eluting stent was placed. The patient is to be on dual antiplatelet therapy with asprin 325mg  and plavix 75mg  for 3 months. Continue cardiac rehabilitation.   Hypertension The patient' blood pressure was well controlled during hospitalization  With losartan 50mg  qd and hctz 12.5mg .   Discharge Vitals:   BP  (!) 148/99 (BP Location: Right Wrist)   Pulse (!) 51   Temp 97.8 F (36.6 C) (Oral)   Resp 11   Ht 5\' 7"  (1.702 m)   Wt 228 lb 2.8 oz (103.5 kg)   LMP 02/11/2011 Comment: no chance of pregnancy per pt  SpO2 100%   BMI 35.74 kg/m   Pertinent Labs, Studies, and Procedures:    Cardiac cath (10/13/17): Prox RCA lesion is 70% stenosed. Prox RCA to Mid RCA lesion is 85% stenosed.  A drug-eluting stent was successfully placed using a STENT SIERRA 3.00 X 38 MM. --Postdilated to 3.5 mm  A drug-eluting stent was successfully placed using a STENT SIERRA 3.25 X 08 MM. Overlapping proximally; postdilated to 3.6 mm  Post intervention, there is a 0% residual stenosis.  Dist RCA lesion is 50% stenosed. RPDA lesion is 60% stenosed.  The left ventricular systolic function is normal -hyperdynamic. The left ventricular ejection fraction is greater than 65% by visual estimate.  LV end diastolic pressure is normal. There is mild aortic valve stenosis. Peak gradient between 25-30 mmHg  Lipid Panel: Tcholes=148, triglycerides=119, hdl=29, ldl=95  Chest x-ray (10/12/17): no active cardiopulmonary disease   Troponins negative x3   Discharge Instructions: Discharge Instructions    AMB Referral to Cardiac Rehabilitation - Phase II   Complete by:  As directed    Diagnosis:  Coronary Stents   Amb Referral to Cardiac Rehabilitation   Complete by:  As directed    Diagnosis:  Coronary Stents   Call MD for:  difficulty breathing, headache or visual disturbances   Complete by:  As directed    Call MD for:  extreme fatigue   Complete by:  As directed    Call MD for:  persistant dizziness or light-headedness   Complete by:  As directed    Call MD for:  persistant nausea and vomiting   Complete by:  As directed    Diet - low sodium heart healthy   Complete by:  As directed    Increase activity slowly   Complete by:  As directed       Signed: Lars Mage, MD 10/14/2017, 2:22 PM   Pager:  [phone]

## 2017-10-14 NOTE — Progress Notes (Signed)
DAILY PROGRESS NOTE   Patient Name: Emma Bonilla Date of Encounter: 10/14/2017  Chief Complaint   No chest pain overnight  Patient Profile   61 year old female with a history of aortic stenosis and a strong family history of heart disease, hypertension, dyslipidemia and 40-50-pack-year smoking history as well as prior TIA.  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  Subjective   No events overnight.  She denies any further chest pain but does report some soreness in the chest.  Her radial cath site is intact without any ecchymosis, bruit or hematoma.  Blood pressure is elevated today and has been for the past month.  She has been symptomatic with headaches.  Lipid profile shows total cholesterol 148 HDL 29 LDL-C 95, triglycerides 119, non-HDL cholesterol of 119.  Was previously on atorvastatin 10 mg daily, but switched to atorvastatin 2 mg daily.  Her LDL is not at goal less than 70, and I would recommend we switch back to atorvastatin 40 mg daily.  It is not clear that she had significant intolerance with this.  If this is not tolerated we could consider rosuvastatin, adding ezetimibe or a PCSK9 inhibitor.   Objective   Vitals:   10/13/17 2215 10/14/17 0040 10/14/17 0537 10/14/17 0745  BP:  117/71 (!) 142/85 140/90  Pulse:  83 85 80  Resp: 17 16 (!) 23 17  Temp:  98.4 F (36.9 C) 98 F (36.7 C) 98.3 F (36.8 C)  TempSrc:  Oral Oral Oral  SpO2:  98% 98% 94%  Weight:   228 lb 2.8 oz (103.5 kg)   Height:        Intake/Output Summary (Last 24 hours) at 10/14/2017 0851 Last data filed at 10/14/2017 0753 Gross per 24 hour  Intake 729 ml  Output 800 ml  Net -71 ml   Filed Weights   10/12/17 1816 10/13/17 0534 10/14/17 0537  Weight: 232 lb 4 oz (105.3 kg) 232 lb 4.8 oz (105.4 kg) 228 lb 2.8 oz (103.5 kg)    Physical Exam   General appearance: alert and no distress Lungs:  clear to auscultation bilaterally Heart: regular rate and rhythm, S1, S2 normal and systolic murmur: midsystolic 3/6, crescendo at 2nd right intercostal space Extremities: extremities normal, atraumatic, no cyanosis or edema Neurologic: Grossly normal  Inpatient Medications    Scheduled Meds: . amitriptyline  50 mg Oral QHS  . angioplasty book   Does not apply Once  . aspirin EC  81 mg Oral Daily  . clopidogrel  75 mg Oral Q breakfast  . fluticasone  1 spray Each Nare Daily  . losartan  50 mg Oral Daily   And  . hydrochlorothiazide  12.5 mg Oral Daily  . Influenza vac split quadrivalent PF  0.5 mL Intramuscular Tomorrow-1000  . levothyroxine  100 mcg Oral QAC breakfast  . metoprolol succinate  12.5 mg Oral QHS  . nicotine  14 mg Transdermal Daily  . olopatadine  1 drop Both Eyes BID  . pantoprazole  40 mg Oral Daily  . pneumococcal 23 valent vaccine  0.5 mL Intramuscular Tomorrow-1000  . potassium chloride  40 mEq Oral Once  . pravastatin  40 mg Oral q1800  . sodium chloride flush  3 mL Intravenous Q12H  . sodium chloride flush  3 mL Intravenous Q12H    Continuous Infusions: . sodium chloride    . nitroGLYCERIN      PRN Meds: sodium  chloride, acetaminophen, morphine injection, ondansetron (ZOFRAN) IV, promethazine, senna-docusate, sodium chloride flush   Labs   Results for orders placed or performed during the hospital encounter of 10/12/17 (from the past 48 hour(s))  Basic metabolic panel     Status: Abnormal   Collection Time: 10/12/17  1:17 PM  Result Value Ref Range   Sodium 140 135 - 145 mmol/L   Potassium 3.7 3.5 - 5.1 mmol/L    Comment: HEMOLYSIS AT THIS LEVEL MAY AFFECT RESULT   Chloride 103 101 - 111 mmol/L   CO2 28 22 - 32 mmol/L   Glucose, Bld 92 65 - 99 mg/dL   BUN 8 6 - 20 mg/dL   Creatinine, Ser 1.03 (H) 0.44 - 1.00 mg/dL   Calcium 9.2 8.9 - 10.3 mg/dL   GFR calc non Af Amer 57 (L) >60 mL/min   GFR calc Af Amer >60 >60 mL/min    Comment: (NOTE) The  eGFR has been calculated using the CKD EPI equation. This calculation has not been validated in all clinical situations. eGFR's persistently <60 mL/min signify possible Chronic Kidney Disease.    Anion gap 9 5 - 15  CBC     Status: Abnormal   Collection Time: 10/12/17  1:17 PM  Result Value Ref Range   WBC 6.8 4.0 - 10.5 K/uL   RBC 5.12 (H) 3.87 - 5.11 MIL/uL   Hemoglobin 15.7 (H) 12.0 - 15.0 g/dL   HCT 46.8 (H) 36.0 - 46.0 %   MCV 91.4 78.0 - 100.0 fL   MCH 30.7 26.0 - 34.0 pg   MCHC 33.5 30.0 - 36.0 g/dL   RDW 14.4 11.5 - 15.5 %   Platelets 321 150 - 400 K/uL  I-stat troponin, ED     Status: None   Collection Time: 10/12/17  1:31 PM  Result Value Ref Range   Troponin i, poc 0.00 0.00 - 0.08 ng/mL   Comment 3            Comment: Due to the release kinetics of cTnI, a negative result within the first hours of the onset of symptoms does not rule out myocardial infarction with certainty. If myocardial infarction is still suspected, repeat the test at appropriate intervals.   Troponin I (q 6hr x 3)     Status: None   Collection Time: 10/12/17  5:47 PM  Result Value Ref Range   Troponin I <0.03 <0.03 ng/mL  D-dimer, quantitative     Status: Abnormal   Collection Time: 10/12/17  6:28 PM  Result Value Ref Range   D-Dimer, Quant 0.59 (H) 0.00 - 0.50 ug/mL-FEU    Comment: (NOTE) At the manufacturer cut-off of 0.50 ug/mL FEU, this assay has been documented to exclude PE with a sensitivity and negative predictive value of 97 to 99%.  At this time, this assay has not been approved by the FDA to exclude DVT/VTE. Results should be correlated with clinical presentation.   Troponin I (q 6hr x 3)     Status: None   Collection Time: 10/12/17 10:03 PM  Result Value Ref Range   Troponin I <0.03 <0.03 ng/mL  HIV antibody (Routine Testing)     Status: None   Collection Time: 10/12/17 10:03 PM  Result Value Ref Range   HIV Screen 4th Generation wRfx Non Reactive Non Reactive     Comment: (NOTE) Performed At: [facility] [place], [place] [id number] Rush Farmer MD [id number]   Troponin I (q 6hr x 3)  Status: None   Collection Time: 10/13/17  5:23 AM  Result Value Ref Range   Troponin I <0.03 <0.03 ng/mL  CBC     Status: None   Collection Time: 10/13/17  5:23 AM  Result Value Ref Range   WBC 6.3 4.0 - 10.5 K/uL   RBC 4.84 3.87 - 5.11 MIL/uL   Hemoglobin 14.6 12.0 - 15.0 g/dL   HCT 44.2 36.0 - 46.0 %   MCV 91.3 78.0 - 100.0 fL   MCH 30.2 26.0 - 34.0 pg   MCHC 33.0 30.0 - 36.0 g/dL   RDW 14.4 11.5 - 15.5 %   Platelets 286 150 - 400 K/uL  Comprehensive metabolic panel     Status: Abnormal   Collection Time: 10/13/17  5:23 AM  Result Value Ref Range   Sodium 137 135 - 145 mmol/L   Potassium 3.2 (L) 3.5 - 5.1 mmol/L   Chloride 103 101 - 111 mmol/L   CO2 27 22 - 32 mmol/L   Glucose, Bld 86 65 - 99 mg/dL   BUN 9 6 - 20 mg/dL   Creatinine, Ser 0.96 0.44 - 1.00 mg/dL   Calcium 8.6 (L) 8.9 - 10.3 mg/dL   Total Protein 6.4 (L) 6.5 - 8.1 g/dL   Albumin 3.2 (L) 3.5 - 5.0 g/dL   AST 18 15 - 41 U/L   ALT 15 14 - 54 U/L   Alkaline Phosphatase 86 38 - 126 U/L   Total Bilirubin 0.8 0.3 - 1.2 mg/dL   GFR calc non Af Amer >60 >60 mL/min   GFR calc Af Amer >60 >60 mL/min    Comment: (NOTE) The eGFR has been calculated using the CKD EPI equation. This calculation has not been validated in all clinical situations. eGFR's persistently <60 mL/min signify possible Chronic Kidney Disease.    Anion gap 7 5 - 15  Glucose, capillary     Status: None   Collection Time: 10/13/17  5:47 AM  Result Value Ref Range   Glucose-Capillary 84 65 - 99 mg/dL  Protime-INR     Status: None   Collection Time: 10/13/17 12:33 PM  Result Value Ref Range   Prothrombin Time 13.3 11.4 - 15.2 seconds   INR 1.02   POCT Activated clotting time     Status: None   Collection Time: 10/13/17  2:32 PM  Result Value Ref Range   Activated Clotting Time 307 seconds   Basic metabolic panel     Status: Abnormal   Collection Time: 10/14/17  2:45 AM  Result Value Ref Range   Sodium 137 135 - 145 mmol/L   Potassium 3.3 (L) 3.5 - 5.1 mmol/L   Chloride 103 101 - 111 mmol/L   CO2 26 22 - 32 mmol/L   Glucose, Bld 87 65 - 99 mg/dL   BUN 7 6 - 20 mg/dL   Creatinine, Ser 1.02 (H) 0.44 - 1.00 mg/dL   Calcium 8.8 (L) 8.9 - 10.3 mg/dL   GFR calc non Af Amer 58 (L) >60 mL/min   GFR calc Af Amer >60 >60 mL/min    Comment: (NOTE) The eGFR has been calculated using the CKD EPI equation. This calculation has not been validated in all clinical situations. eGFR's persistently <60 mL/min signify possible Chronic Kidney Disease.    Anion gap 8 5 - 15  CBC     Status: None   Collection Time: 10/14/17  2:45 AM  Result Value Ref Range   WBC 7.6 4.0 - 10.5  K/uL   RBC 4.97 3.87 - 5.11 MIL/uL   Hemoglobin 14.9 12.0 - 15.0 g/dL   HCT 45.0 36.0 - 46.0 %   MCV 90.5 78.0 - 100.0 fL   MCH 30.0 26.0 - 34.0 pg   MCHC 33.1 30.0 - 36.0 g/dL   RDW 14.1 11.5 - 15.5 %   Platelets 291 150 - 400 K/uL  Lipid panel     Status: Abnormal   Collection Time: 10/14/17  2:45 AM  Result Value Ref Range   Cholesterol 148 0 - 200 mg/dL   Triglycerides 119 <150 mg/dL   HDL 29 (L) >40 mg/dL   Total CHOL/HDL Ratio 5.1 RATIO   VLDL 24 0 - 40 mg/dL   LDL Cholesterol 95 0 - 99 mg/dL    Comment:        Total Cholesterol/HDL:CHD Risk Coronary Heart Disease Risk Table                     Men   Women  1/2 Average Risk   3.4   3.3  Average Risk       5.0   4.4  2 X Average Risk   9.6   7.1  3 X Average Risk  23.4   11.0        Use the calculated Patient Ratio above and the CHD Risk Table to determine the patient's CHD Risk.        ATP III CLASSIFICATION (LDL):  <100     mg/dL   Optimal  100-129  mg/dL   Near or Above                    Optimal  130-159  mg/dL   Borderline  160-189  mg/dL   High  >190     mg/dL   Very High     ECG   NSR at 82 - anterior infarct pattern-  Personally Reviewed  Telemetry   Sinus rhythm - Personally Reviewed  Radiology    Dg Chest 2 View  Result Date: 10/12/2017 CLINICAL DATA:  Chest pain EXAM: CHEST  2 VIEW COMPARISON:  None. FINDINGS: The heart size and mediastinal contours are within normal limits. Both lungs are clear. The visualized skeletal structures are unremarkable. IMPRESSION: No active cardiopulmonary disease. Electronically Signed   By: Franchot Gallo M.D.   On: 10/12/2017 13:45    Cardiac Studies   Conclusion     Prox RCA lesion is 70% stenosed. Prox RCA to Mid RCA lesion is 85% stenosed.  A drug-eluting stent was successfully placed using a STENT SIERRA 3.00 X 38 MM. --Postdilated to 3.5 mm  A drug-eluting stent was successfully placed using a STENT SIERRA 3.25 X 08 MM. Overlapping proximally; postdilated to 3.6 mm  Post intervention, there is a 0% residual stenosis.  Dist RCA lesion is 50% stenosed. RPDA lesion is 60% stenosed.  The left ventricular systolic function is normal -hyperdynamic. The left ventricular ejection fraction is greater than 65% by visual estimate.  LV end diastolic pressure is normal.  There is mild aortic valve stenosis. Peak gradient between 25-30 mmHg   Severe single-vessel disease involving the proximal to mid RCA.  Long area of stenosis covered with a long stent  with a more focal proximal lesion covered with an overlapping stent proximally.  Known mild aortic stenosis.  Plan:  Transfer to 6 Central post procedure unit for post PCI care and TR band removal  Dual antiplatelet therapy for  minimum of 3 months (aspirin plus Plavix, could stop aspirin at 3 months and continue Plavix (would likely continue beyond 1 year given the extent of stented segment)  Continue aggressive cardiac risk factor modification with hypertension and lipid control.  Anticipate discharge tomorrow from cardiology standpoint    Assessment   1. Active Problems: 2.   Essential  hypertension 3.   Tobacco abuse 4.   Hyperlipidemia 5.   Aortic valve stenosis 6.   Chest pain 7.   Obesity 8.   Shortness of breath 9.   Unstable angina ([place]) 10.   Plan   1. Feels better today. BP elevated.  Will increase losartan to 100 mg daily and continue HCTZ and Toprol-XL at current doses.  She previously been on a low dose of atorvastatin and was changed to pitavastatin.  Her LDL is not at goal and would benefit from a high potency statin.  Change to atorvastatin 40 mg QHS.  Repeat lipid profile in 2-3 months.  Okay for discharge today from a cardiac standpoint.  She will need to remain on aspirin and Plavix for at least a year.  Time Spent Directly with Patient:  I have spent a total of 15 minutes with the patient reviewing hospital notes, telemetry, EKGs, labs and examining the patient as well as establishing an assessment and plan that was discussed personally with the patient. > 50% of time was spent in direct patient care.  Length of Stay:  LOS: 1 day   Pixie Casino, MD, [facility], [place] Director of the [facility] Lipid Disorders &  Cardiovascular Risk Reduction Clinic Attending Cardiologist  Direct Dial: [phone]  Fax: [phone]  Website:  [email or web address] 10/14/2017, 8:51 AM

## 2017-10-14 NOTE — Care Management Note (Signed)
Case Management Note  Patient Details  Name: Emma Bonilla MRN: [id number] Date of Birth: May 12, 1956  Subjective/Objective:   From home, pta indep s/p coronary stent intervention, will be on plavix, for dc today.  She goes to internal medicine clinic, PCP is Dr. Laurin Coder,  Patient goes to [facility] to get her medications for $6 which she states she can afford, has orange card which she is in process of updating thru internal med clinic.                Action/Plan:   Expected Discharge Date:                  Expected Discharge Plan:  Home/Self Care  In-House Referral:     Discharge planning Services  CM Consult  Post Acute Care Choice:    Choice offered to:     DME Arranged:    DME Agency:     HH Arranged:    HH Agency:     Status of Service:  Completed, signed off  If discussed at [employer] of Stay Meetings, dates discussed:    Additional Comments:  Zenon Mayo, RN 10/14/2017, 10:28 AM

## 2017-10-14 NOTE — Discharge Instructions (Signed)
It was a pleasure to take care of you Emma Bonilla. During this hospitalization you were treated for your chest pain. There was a blockage found in the vessels of your heart and that was repaired with a stent placement. You may experience some mild chest discomfort post the procedure, but please call 911 immediately should you have any extreme shortness of breath or chest pain. Please take all your medication as mentioned on your discharge summary. Thank you!  Emma Mage, MD Internal Medicine    Acute Coronary Syndrome Acute coronary syndrome (ACS) is a serious problem in which there is suddenly not enough blood and oxygen supplied to the heart. ACS may mean that one or more of the blood vessels in your heart (coronary arteries) may be blocked. ACS can result in chest pain or a heart attack (myocardial infarction or MI). What are the causes? This condition is caused by atherosclerosis, which is the buildup of fat and cholesterol (plaque) on the inside of the arteries. Over time, the plaque may narrow or block the artery, and this will lessen blood flow to the heart. Plaque can also become weak and break off within a coronary artery to form a clot and cause a sudden blockage. What increases the risk? The risk factors of this condition include:  High cholesterol levels.  High blood pressure (hypertension).  Smoking.  Diabetes.  Age.  Family history of chest pain, heart disease, or stroke.  Lack of exercise.  What are the signs or symptoms? The most common signs of this condition include:  Chest pain, which can be: ? A crushing or squeezing in the chest. ? A tightness, pressure, fullness, or heaviness in the chest. ? Present for more than a few minutes, or it can stop and recur.  Pain in the arms, neck, jaw, or back.  Unexplained heartburn or indigestion.  Shortness of breath.  Nausea.  Sudden cold sweats.  Feeling light-headed or dizzy.  Sometimes, this condition has no  symptoms. How is this diagnosed? ACS may be diagnosed through the following tests:  Electrocardiogram (ECG).  Blood tests.  Coronary angiogram. This is a procedure to look at the coronary arteries to see if there is any blockage.  How is this treated? Treatment for ACS may include:  Healthy behavioral changes to reduce or control risk factors.  Medicine.  Coronary stenting.A stent helps to keep an artery open.  Coronary angioplasty. This procedure widens a narrowed or blocked artery.  Coronary artery bypass surgery. This will allow your blood to pass the blockage (bypass) to reach your heart.  Follow these instructions at home: Eating and drinking  Follow a heart-healthy diet. A dietitian can you help to educate you about healthy food options and changes.  Use healthy cooking methods such as roasting, grilling, broiling, baking, poaching, steaming, or stir-frying. Talk to a dietitian to learn more about healthy cooking methods. Medicines  Take medicines only as directed by your health care provider.  Do not take the following medicines unless your health care provider approves: ? Nonsteroidal anti-inflammatory drugs (NSAIDs), such as ibuprofen, naproxen, or celecoxib. ? Vitamin supplements that contain vitamin A, vitamin E, or both. ? Hormone replacement therapy that contains estrogen with or without progestin.  Stop illegal drug use. Activity  Follow an exercise program that is approved by your health care provider.  Plan rest periods when you are fatigued. Lifestyle  Do not use any tobacco products, including cigarettes, chewing tobacco, or electronic cigarettes. If you need help quitting,  ask your health care provider.  If you drink alcohol, and your health care provider approves, limit your alcohol intake to no more than 1 drink per day. One drink equals 12 ounces of beer, 5 ounces of wine, or 1 ounces of hard liquor.  Learn to manage stress.  Maintain a  healthy weight. Lose weight as approved by your health care provider. General instructions  Manage other health conditions, such as hypertension and diabetes, as directed by your health care provider.  Keep all follow-up visits as directed by your health care provider. This is important.  Your health care provider may ask you to monitor your blood pressure. A blood pressure reading consists of a higher number over a lower number, such as 110 over 72, written as 110/72. Ideally, your blood pressure should be: ? Below 140/90 if you have no other medical conditions. ? Below 130/80 if you have diabetes or kidney disease. Get help right away if:  You have pain in your chest, neck, arm, jaw, stomach, or back that lasts more than a few minutes, is recurring, or is not relieved by taking medicine under your tongue (sublingual nitroglycerin).  You have profuse sweating without cause.  You have unexplained: ? Heartburn or indigestion. ? Shortness of breath or difficulty breathing. ? Nausea or vomiting. ? Fatigue. ? Feelings of nervousness or anxiety. ? Weakness. ? Diarrhea.  You have sudden light-headedness or dizziness.  You faint. These symptoms may represent a serious problem that is an emergency. Do not wait to see if the symptoms will go away. Get medical help right away. Call your local emergency services (911 in the [place] Do not drive yourself to the clinic or hospital. This information is not intended to replace advice given to you by your health care provider. Make sure you discuss any questions you have with your health care provider. Document Released: 11/17/2005 Document Revised: 04/30/2016 Document Reviewed: 03/21/2014 Elsevier Interactive Patient Education  2017 [employer].

## 2017-10-14 NOTE — Telephone Encounter (Signed)
Hospital TOC per Dr Maricela Bo, discharge 10/14/2017, appt 10/20/2017.

## 2017-10-15 ENCOUNTER — Telehealth (HOSPITAL_COMMUNITY): Payer: Self-pay

## 2017-10-15 NOTE — Telephone Encounter (Signed)
Referral received. Patient is Medicaid potential. Called patient and she is interested in our Maintenance program. Gave referral to our Maintenance Program Coordinator.

## 2017-10-20 ENCOUNTER — Ambulatory Visit (INDEPENDENT_AMBULATORY_CARE_PROVIDER_SITE_OTHER): Payer: Self-pay | Admitting: Internal Medicine

## 2017-10-20 ENCOUNTER — Other Ambulatory Visit: Payer: Self-pay

## 2017-10-20 ENCOUNTER — Ambulatory Visit (HOSPITAL_COMMUNITY)
Admission: RE | Admit: 2017-10-20 | Discharge: 2017-10-20 | Disposition: A | Discharge status: Home / Self Care | Payer: Self-pay | Source: Ambulatory Visit | Attending: Internal Medicine | Admitting: Internal Medicine

## 2017-10-20 VITALS — BP 109/73 | HR 90 | Temp 98.1°F | Ht 67.0 in | Wt 230.9 lb

## 2017-10-20 DIAGNOSIS — K219 Gastro-esophageal reflux disease without esophagitis: Secondary | ICD-10-CM

## 2017-10-20 DIAGNOSIS — Z7902 Long term (current) use of antithrombotics/antiplatelets: Secondary | ICD-10-CM

## 2017-10-20 DIAGNOSIS — Z7982 Long term (current) use of aspirin: Secondary | ICD-10-CM

## 2017-10-20 DIAGNOSIS — R0789 Other chest pain: Secondary | ICD-10-CM

## 2017-10-20 DIAGNOSIS — F17210 Nicotine dependence, cigarettes, uncomplicated: Secondary | ICD-10-CM

## 2017-10-20 DIAGNOSIS — R079 Chest pain, unspecified: Secondary | ICD-10-CM

## 2017-10-20 DIAGNOSIS — R9431 Abnormal electrocardiogram [ECG] [EKG]: Secondary | ICD-10-CM | POA: Insufficient documentation

## 2017-10-20 DIAGNOSIS — Z9889 Other specified postprocedural states: Secondary | ICD-10-CM | POA: Insufficient documentation

## 2017-10-20 DIAGNOSIS — Z48812 Encounter for surgical aftercare following surgery on the circulatory system: Secondary | ICD-10-CM | POA: Insufficient documentation

## 2017-10-20 DIAGNOSIS — Z955 Presence of coronary angioplasty implant and graft: Secondary | ICD-10-CM

## 2017-10-20 DIAGNOSIS — I2510 Atherosclerotic heart disease of native coronary artery without angina pectoris: Secondary | ICD-10-CM

## 2017-10-20 MED ORDER — PANTOPRAZOLE SODIUM 20 MG PO TBEC: 20.0000 mg | DELAYED_RELEASE_TABLET | Freq: Every day | ORAL | 0 refills | Status: DC

## 2017-10-20 NOTE — Progress Notes (Signed)
   CC: HFU, chest pain  HPI:  Emma Bonilla is a 61 y.o. female with a past medical history listed below here today for follow up of her recent hospitalization for chest pain.  She was admitted to [facility] on 10/02/17 with chest pain. It was intermittent, sharp, non-exertional and improved with change in position. Troponins were negative. EKG with new q waves in inferior leads. She went for coronary angiogram which showed 70% stenosis int he proximal RCA and DES was placed. She was started on ASA and plavix and discharged home.  Today, she reports that her chest pain is improved since the hospitalization but has not resolved. Reports that she has continued left sided non-radiating sharp pain. Occurs randomly but does seem to happen more often with movement. Denies any shortness of breath, nausea/vomiting, abdominal pain, diaphoresis. Reports the pains only last for seconds and resolved on their own. However, does note that she has had 2 episodes lasting >5 minutes. She took Tylenol during these episodes which did resolve her symptoms. Reports this pain feels like what she went to the hospital for but is less intense; rates it 4/10 when it occurs. No active chest pain today.   Does report being confused over the change in her GERD medication. Does not know when or how often to take the Protonix. Does report having bloating and belching. No burning pain.   Past Medical History:  Diagnosis Date  . Arthritis   . Depression   . Diastolic dysfunction without heart failure   . GERD (gastroesophageal reflux disease)   . Hepatic steatosis    a. seen on prior US.  Marland Kitchen Hyperlipidemia   . Hypertension   . Mild aortic regurgitation   . Mild aortic stenosis   . Premature atrial contractions   . PVC's (premature ventricular contractions)   . Thyroid disease   . TIA (transient ischemic attack)    2010  . Tobacco abuse    Review of Systems:   Negative except as noted in HPI  Physical  Exam:  Vitals:   10/20/17 1033  BP: 109/73  Pulse: 90  Temp: 98.1 F (36.7 C)  TempSrc: Oral  SpO2: 96%  Weight: 230 lb 14.4 oz (104.7 kg)  Height: 5\' 7"  (1.702 m)   General: alert, well-developed, and cooperative to examination.  Lungs: normal respiratory effort, no accessory muscle use, normal breath sounds, no crackles, and no wheezes. Heart: normal rate, regular rhythm, no murmur, no gallop, and no rub.  Abdomen: soft, non-tender, normal bowel sounds, no distention, no guarding, no rebound tenderness, no hepatomegaly, and no splenomegaly.  Pulses: 2+ DP/PT pulses bilaterally Extremities: No cyanosis, clubbing, edema Skin: turgor normal and no rashes.   Assessment & Plan:   See Encounters Tab for problem based charting.  Patient discussed with Dr. Angelia Mould

## 2017-10-20 NOTE — Patient Instructions (Signed)
Emma Bonilla,  Please continue to take the Aspirin and Plavix every day. The Protonix (pantroprazole) they put you on at the hospital for your heartburn, please take 1 pill every day.   Please follow up with the heart doctors next month and start cardiac rehab.   Please follow up with your PCP in 1 month.

## 2017-10-21 NOTE — Assessment & Plan Note (Signed)
Patient with recent hospitalization s/p DES to proximal RCA for 70% stenosis. Still having some left sided chest pain. Describes atypical chest pain.   EKG today without any acute ischemic changes. She has been taking her DAP therapy as instructed. She has not been taking her PPI since discharge which may be contributing to some of her symptoms as she describes bloating and belching. No active chest pain today.   Will start her back on protonix 20 mg daily and monitor response. Follow up with cardiology next month.

## 2017-10-26 NOTE — Telephone Encounter (Signed)
Seen for HFU on 10/20/2017 by Dr Charlynn Grimes.Emma Hidden Cassady11/26/20183:31 PM

## 2017-10-27 NOTE — Progress Notes (Signed)
Internal Medicine Clinic Attending  Case discussed with Dr. Boswell at the time of the visit.  We reviewed the resident's history and exam and pertinent patient test results.  I agree with the assessment, diagnosis, and plan of care documented in the resident's note.  

## 2017-11-10 ENCOUNTER — Ambulatory Visit: Payer: Self-pay | Admitting: Internal Medicine

## 2017-11-16 ENCOUNTER — Other Ambulatory Visit: Payer: Self-pay | Admitting: Internal Medicine

## 2017-11-16 NOTE — Telephone Encounter (Signed)
PT requesting a refill on her Plavix and Lipitor.

## 2017-11-17 MED ORDER — CLOPIDOGREL BISULFATE 75 MG PO TABS: 75.0000 mg | ORAL_TABLET | Freq: Every day | ORAL | 1 refills | Status: DC

## 2017-11-17 MED ORDER — ATORVASTATIN CALCIUM 40 MG PO TABS: 40.0000 mg | ORAL_TABLET | Freq: Every day | ORAL | 3 refills | Status: DC

## 2017-11-17 MED ORDER — AMITRIPTYLINE HCL 25 MG PO TABS: 50.0000 mg | ORAL_TABLET | Freq: Every day | ORAL | 3 refills | Status: DC

## 2017-11-19 ENCOUNTER — Encounter: Payer: Self-pay | Admitting: Internal Medicine

## 2017-11-19 ENCOUNTER — Ambulatory Visit (INDEPENDENT_AMBULATORY_CARE_PROVIDER_SITE_OTHER): Payer: No Typology Code available for payment source | Admitting: Internal Medicine

## 2017-11-19 VITALS — BP 132/89 | HR 86 | Ht 67.0 in | Wt 233.4 lb

## 2017-11-19 DIAGNOSIS — Z955 Presence of coronary angioplasty implant and graft: Secondary | ICD-10-CM

## 2017-11-19 DIAGNOSIS — E782 Mixed hyperlipidemia: Secondary | ICD-10-CM

## 2017-11-19 DIAGNOSIS — I200 Unstable angina: Secondary | ICD-10-CM

## 2017-11-19 DIAGNOSIS — I350 Nonrheumatic aortic (valve) stenosis: Secondary | ICD-10-CM

## 2017-11-19 NOTE — Patient Instructions (Signed)
Your physician recommends that you return for lab work in 2-3 months (fasting) to check cholesterol   Your physician wants you to follow-up in: 6 months with Dr. Debara Pickett. You will receive a reminder letter in the mail two months in advance. If you don't receive a letter, please call our office to schedule the follow-up appointment.

## 2017-11-19 NOTE — Progress Notes (Signed)
OFFICE NOTE  Chief Complaint:  Follow-up hospitalization  Primary Care Physician: Welford Roche, MD  HPI:  Emma Bonilla is a pleasant 61 year old female is currently referred to me from the internal medicine clinic for evaluation of palpitations. Emma Bonilla has a strong family history of heart disease with both her mother who had congestive heart failure in her father who had died of an MI. She has a personal history of hypertension, dyslipidemia, about a 40-50 pack year smoking history and a known murmur. She presents for evaluation of palpitations. She reports this is been going on for about 2 months and mostly takes place at rest. She notices at night. There are episodes were heart races for several seconds and then goes back to normal. She said she's had 2 or 3 of these episodes in the past month. She does report a prior history of TIA in 2010 and started taking aspirin after that. She has good control of her cholesterol on Crestor. She denies any chest pain or worsening shortness of breath. She's never had presyncope or syncopal symptoms.  Emma Bonilla returns today for follow-up of echo and monitor. The echocardiogram shows an EF of 15-40% with diastolic dysfunction. Additionally there was mild aortic stenosis with a functionally bicuspid valve which is moderately calcified. There was moderate regurgitation. The mean gradient was 13 mmHg. She also had a monitor which demonstrated PACs and PVCs but otherwise sinus rhythm. No A. fib or ventricular arrhythmias were noted. She reports her palpitations have picked up some since she wore the monitor however suspect it's more of the same. She denies any cardiac sounding chest pain or worsening shortness of breath with exertion although I have a high retest probability of coronary disease.  I saw Emma Bonilla back today in follow-up. She has since been started on low-dose beta blocker and is tolerating it well. She says this is  significantly help with her palpitations and she has no more symptoms.  06/16/2016  Emma Bonilla was seen back today in follow-up. She denies any worsening palpitations. EKG shows normal sinus rhythm. Unfortunate she's had close to 10 pound weight gain. She attributed this to stopping smoking, which I commended her on. I have however encourage her to work on some more exercise.  01/19/2017  Emma Bonilla was seen today in follow-up. She recently got the news that her sister has stage IV colon cancer. Based on these findings she is concerned that she may also have colon cancer and has scheduled an upcoming colonoscopy on February 26. Given her establish cardiac history, preoperative risk assessment was recommended. She last had an echo more than a year ago which showed mild aortic stenosis and a functionally bicuspid aortic valve with moderate aortic insufficiency. LVEF is 65-70%. She also had hypertension which was not adequately controlled however blood pressure today is improved at 134/78. She denies any chest pain or worsening shortness of breath.  11/19/2017  Emma Bonilla returns today for follow-up of her hospitalization.  She recently was admitted with unstable angina and underwent left heart catheterization by Dr. Ellyn Hack on 10/13/2017.  This demonstrated percent stenosis to the mid RCA and 70% proximal RCA stenosis.  She underwent extensive overlapping 3.0 and 3.25 x 38 mm drug-eluting stents.  LVEF was preserved at 60%.  It was noted she has mild aortic valve stenosis with a peak gradient between 25 and 30 mmHg.  Since discharge she has had marked improvement in her symptoms.  She denies any significant shortness  of breath.  She occasionally gets chest discomfort which is improved with Tylenol and worse with lifting that sounds musculoskeletal.  She is scheduled to start cardiac rehab on January 14.  She has had no side effects of high-dose atorvastatin, which was started in the hospital.  In  addition I increased her losartan and blood pressure is at goal today 132/89.  The good news is she reported she stopped smoking about 4 days ago.  She says she is going to be committed to this.  She is concerned because her sister has a LVAD.  PMHx:  Past Medical History:  Diagnosis Date  . Arthritis   . Depression   . Diastolic dysfunction without heart failure   . GERD (gastroesophageal reflux disease)   . Hepatic steatosis    a. seen on prior US.  Marland Kitchen Hyperlipidemia   . Hypertension   . Mild aortic regurgitation   . Mild aortic stenosis   . Premature atrial contractions   . PVC's (premature ventricular contractions)   . Thyroid disease   . TIA (transient ischemic attack)    2010  . Tobacco abuse     Past Surgical History:  Procedure Laterality Date  . CORONARY STENT INTERVENTION N/A 10/13/2017   Procedure: CORONARY STENT INTERVENTION;  Surgeon: Leonie Man, MD;  Location: [facility];  Service: Cardiovascular;  Laterality: N/A;  . DILATION AND CURETTAGE OF UTERUS     30 years ago  . LEFT HEART CATH AND CORONARY ANGIOGRAPHY N/A 10/13/2017   Procedure: LEFT HEART CATH AND CORONARY ANGIOGRAPHY;  Surgeon: Leonie Man, MD;  Location: [facility];  Service: Cardiovascular;  Laterality: N/A;    FAMHx:  Family History  Problem Relation Age of Onset  . Hypertension Mother   . Emphysema Mother   . Cancer Mother        lung  . Heart failure Mother        CHF  . Heart disease Father        MI at 44  . Hypertension Father   . Crohn's disease Father   . Heart disease Sister        LVAD (2014) - MI in her 46s  . Hypertension Sister   . Colon cancer Sister        42 Mets  . Kidney cancer Sister   . Skin cancer Maternal Grandmother   . Stroke Maternal Grandfather   . Diabetes Brother   . Valvular heart disease Sister        leaky valve  . Hypertension Sister   . Hyperlipidemia Sister   . Hypertension Sister   . Crohn's disease Sister   .  Hypertension Child   . Hyperlipidemia Child   . Hyperlipidemia Child     SOCHx:   reports that she has been smoking cigarettes.  She has a 22.50 pack-year smoking history. she has never used smokeless tobacco. She reports that she drinks alcohol. She reports that she uses drugs. Drug: Marijuana. Frequency: 1.00 time per week.  ALLERGIES:  Allergies  Allergen Reactions  . Naproxen Sodium Rash    ROS: Pertinent items noted in HPI and remainder of comprehensive ROS otherwise negative.  HOME MEDS: Current Outpatient Medications  Medication Sig Dispense Refill  . amitriptyline (ELAVIL) 25 MG tablet Take 2 tablets (50 mg total) by mouth at bedtime. 90 tablet 3  . aspirin 81 MG tablet Take 1 tablet (81 mg total) by mouth daily. 30 tablet 3  .  atorvastatin (LIPITOR) 40 MG tablet Take 1 tablet (40 mg total) by mouth daily at 6 PM. 90 tablet 3  . Biotin 5000 MCG CAPS Take by mouth.    . calcium-vitamin D (OSCAL WITH D) 500-200 MG-UNIT per tablet Take 1 tablet by mouth daily. (Patient taking differently: Take 1 tablet by mouth 2 (two) times daily. ) 30 tablet 3  . cetirizine (ZYRTEC) 10 MG tablet Take 1 tablet (10 mg total) by mouth daily. 60 tablet 3  . clopidogrel (PLAVIX) 75 MG tablet Take 1 tablet (75 mg total) by mouth daily with breakfast. 30 tablet 1  . ferrous sulfate 325 (65 FE) MG tablet TAKE 1 TABLET BY MOUTH ONCE DAILY 90 tablet 1  . levothyroxine (SYNTHROID) 100 MCG tablet Take 1 tablet (100 mcg total) by mouth daily. 90 tablet 1  . losartan-hydrochlorothiazide (HYZAAR) 50-12.5 MG tablet Take 2 tablets daily by mouth. 60 tablet 0  . Melatonin 10 MG CAPS Take by mouth.    . Menthol, Topical Analgesic, (STOPAIN) 8 % LIQD Apply 1 application daily as needed topically (knee and ankle pain).    . metoprolol succinate (TOPROL-XL) 25 MG 24 hr tablet Take 0.5 tablets (12.5 mg total) daily by mouth. 15 tablet 2  . mometasone (NASONEX) 50 MCG/ACT nasal spray Place 2 sprays into the nose  daily as needed. 17 g 1  . Olopatadine HCl 0.2 % SOLN Apply 1 drop to eye daily as needed. 3 Bottle 3  . pantoprazole (PROTONIX) 20 MG tablet Take 1 tablet (20 mg total) by mouth daily. 30 tablet 0   No current facility-administered medications for this visit.     LABS/IMAGING: No results found for this or any previous visit (from the past 48 hour(s)). No results found.  WEIGHTS: Wt Readings from Last 3 Encounters:  11/19/17 233 lb 6.4 oz (105.9 kg)  10/20/17 230 lb 14.4 oz (104.7 kg)  10/14/17 228 lb 2.8 oz (103.5 kg)    VITALS: BP 132/89   Pulse 86   Ht 5\' 7"  (1.702 m)   Wt 233 lb 6.4 oz (105.9 kg)   LMP 02/11/2011 Comment: no chance of pregnancy per pt  SpO2 94%   BMI 36.56 kg/m   EXAM: General appearance: alert and no distress Neck: no carotid bruit, no JVD and thyroid not enlarged, symmetric, no tenderness/mass/nodules Lungs: clear to auscultation bilaterally Heart: regular rate and rhythm, systolic murmur: early systolic 2/6, crescendo at 2nd right intercostal space and diastolic murmur: mid diastolic 2/6, blowing at lower left sternal border Abdomen: soft, non-tender; bowel sounds normal; no masses,  no organomegaly and obese Extremities: extremities normal, atraumatic, no cyanosis or edema Pulses: 2+ and symmetric Skin: Skin color, texture, turgor normal. No rashes or lesions Neurologic: Grossly normal Psych: Pleasant  EKG: Deferred  ASSESSMENT: 1. Unstable angina - s/p PCI to the RCA with overlapping DES (10/2017) 2. Palpitations - PACs and PVCs - resolved with beta blocker 3. Mild aortic stenosis with a functionally bicuspid valve, moderate aortic insufficiency, EF 65-70% 4. Hypertension 5. Dyslipidemia  PLAN: 1.   Emma Bonilla had unstable angina recently and underwent PCI to the RCA with overlapping drug-eluting stents.  I have increased her blood pressure medications and started her on high-dose statin.  She will need a repeat lipid profile in 2 months.   She does have mild aortic stenosis with a functionally bicuspid valve and moderate aortic insufficiency which will need to be followed.  This aortic stenosis seems stable during catheterization.  Follow-up  with me in 6 months.  She will begin cardiac rehab in January.  Pixie Casino, MD, [facility], [place] Director of the Advanced Lipid Disorders &  Cardiovascular Risk Reduction Clinic Attending Cardiologist  Direct Dial: [phone]  Fax: [phone]  Website:  [email or web address] 11/19/2017, 8:41 AM

## 2017-11-26 ENCOUNTER — Other Ambulatory Visit: Payer: Self-pay | Admitting: *Deleted

## 2017-11-26 MED ORDER — MOMETASONE FUROATE 50 MCG/ACT NA SUSP: 2.0000 | Freq: Every day | NASAL | 2 refills | Status: DC | PRN

## 2017-12-03 ENCOUNTER — Other Ambulatory Visit: Payer: Self-pay

## 2017-12-03 NOTE — Telephone Encounter (Signed)
losartan-hydrochlorothiazide (HYZAAR) 50-12.5 MG tablet, refill request @ [facility] on [address].

## 2017-12-04 ENCOUNTER — Other Ambulatory Visit: Payer: Self-pay | Admitting: *Deleted

## 2017-12-04 MED ORDER — LOSARTAN POTASSIUM-HCTZ 50-12.5 MG PO TABS: 2.0000 | ORAL_TABLET | Freq: Every day | ORAL | 5 refills | Status: DC

## 2017-12-04 NOTE — Telephone Encounter (Signed)
Please clarify that pt is not to take dexlansoprazole 30mg , in med hx it states she was to stop taking it at disch from [place] 11/14. Do not see that in the disch instructions. Please advise

## 2017-12-04 NOTE — Telephone Encounter (Signed)
Per discharge summary, patient is supposed to be taking this medication. Seems like she has a history of GERD. She can continue taking it.

## 2017-12-05 MED ORDER — DEXLANSOPRAZOLE 30 MG PO CPDR: 30.0000 mg | DELAYED_RELEASE_CAPSULE | Freq: Every day | ORAL | 3 refills | Status: DC

## 2017-12-08 NOTE — Telephone Encounter (Signed)
Note from pharmacy: patient states in [place] she was changed to protonix & has been getting it from [facility]. Patient will discuss protonix vs dexilant @ her upcoming appt w/ pcp 12/11/17.

## 2017-12-08 NOTE — Telephone Encounter (Signed)
Thank you. Will discuss with patient when I see her this month.

## 2017-12-11 ENCOUNTER — Encounter: Payer: Self-pay | Admitting: Internal Medicine

## 2017-12-11 ENCOUNTER — Ambulatory Visit (INDEPENDENT_AMBULATORY_CARE_PROVIDER_SITE_OTHER): Payer: No Typology Code available for payment source | Admitting: Internal Medicine

## 2017-12-11 VITALS — BP 138/78 | HR 72 | Wt 231.2 lb

## 2017-12-11 DIAGNOSIS — Z720 Tobacco use: Secondary | ICD-10-CM

## 2017-12-11 DIAGNOSIS — I25110 Atherosclerotic heart disease of native coronary artery with unstable angina pectoris: Secondary | ICD-10-CM

## 2017-12-11 DIAGNOSIS — E669 Obesity, unspecified: Secondary | ICD-10-CM

## 2017-12-11 DIAGNOSIS — I2510 Atherosclerotic heart disease of native coronary artery without angina pectoris: Secondary | ICD-10-CM

## 2017-12-11 DIAGNOSIS — F17210 Nicotine dependence, cigarettes, uncomplicated: Secondary | ICD-10-CM

## 2017-12-11 DIAGNOSIS — I10 Essential (primary) hypertension: Secondary | ICD-10-CM

## 2017-12-11 DIAGNOSIS — Z9861 Coronary angioplasty status: Secondary | ICD-10-CM

## 2017-12-11 DIAGNOSIS — Z7902 Long term (current) use of antithrombotics/antiplatelets: Secondary | ICD-10-CM

## 2017-12-11 DIAGNOSIS — Z955 Presence of coronary angioplasty implant and graft: Secondary | ICD-10-CM

## 2017-12-11 DIAGNOSIS — K219 Gastro-esophageal reflux disease without esophagitis: Secondary | ICD-10-CM

## 2017-12-11 DIAGNOSIS — Z6836 Body mass index (BMI) 36.0-36.9, adult: Secondary | ICD-10-CM

## 2017-12-11 DIAGNOSIS — Z7982 Long term (current) use of aspirin: Secondary | ICD-10-CM

## 2017-12-11 DIAGNOSIS — Z79899 Other long term (current) drug therapy: Secondary | ICD-10-CM

## 2017-12-11 MED ORDER — PANTOPRAZOLE SODIUM 20 MG PO TBEC: 20.0000 mg | DELAYED_RELEASE_TABLET | Freq: Every day | ORAL | 2 refills | Status: DC

## 2017-12-11 MED ORDER — FERROUS SULFATE 325 (65 FE) MG PO TABS: 325.0000 mg | ORAL_TABLET | Freq: Every day | ORAL | 2 refills | Status: DC

## 2017-12-11 NOTE — Patient Instructions (Addendum)
It was nice to see you again, Emma Bonilla. I'm glad you ar feeling better! You blood pressure looked great today. Continue taking Hyzaar as usual. Please call the clinic and let me know when you are running out of this medication so that I can refill it in time.   Continue taking the aspirin and Plavix as usual.   Continue taking your Protonix 30 minutes before breakfast.   Come back in 6 months for a regular follow up or sooner if you need it.

## 2017-12-12 ENCOUNTER — Encounter: Payer: Self-pay | Admitting: Internal Medicine

## 2017-12-12 NOTE — Assessment & Plan Note (Signed)
Patient presents for HTN follow-up. States she ran out of the Hyzaar early this month and started having headaches in the setting of elevated blood pressures.  Restarted taking Hyzaar 7 days ago and now headache is resolved. BP at goal during this visit, 138/78.  Currently on Hyzaar 100-25 mg daily.  No changes to medications at this time.  Advised patient to call clinic next time for refill before she runs out of medication.   -Continue current regimen

## 2017-12-12 NOTE — Assessment & Plan Note (Addendum)
Patient recently admitted to the hospital in 10/2017 for unstable angina.  Underwent LHC and pRCA 70% stenosis found.  She is s/p DES x2 to RCA and currently on aspirin and Plavix.  Reports compliance with these medications.  Continues to have chest pain, but states is very mild, intermittent, and associated to food intake.  She is currently on Protonix for GERD which was started when she was discharged from the hospital.  However, patient has been taking it after meals.  Discussed proper intake is 30 minutes prior to breakfast for optimal effect.  - Refilled protonix 20 mg QD

## 2017-12-12 NOTE — Progress Notes (Signed)
   CC: Chest pain, HTN and tobacco abuse follow up   HPI:  [name] is a 62 y.o. female with PMH as listed below who presents to clinic for chest pain, hypertension, and tobacco abuse follow-up.  Please see problem based assessment and plan for further details.  Past Medical History:  Diagnosis Date  . Arthritis   . Depression   . Diastolic dysfunction without heart failure   . GERD (gastroesophageal reflux disease)   . Hepatic steatosis    a. seen on prior US.  Marland Kitchen Hyperlipidemia   . Hypertension   . Mild aortic regurgitation   . Mild aortic stenosis   . Premature atrial contractions   . PVC's (premature ventricular contractions)   . Thyroid disease   . TIA (transient ischemic attack)    2010  . Tobacco abuse    Review of Systems:   Review of Systems  Constitutional: Negative for chills, diaphoresis, fever and malaise/fatigue.  Respiratory: Negative for cough and shortness of breath.   Cardiovascular: Negative for chest pain.  Gastrointestinal: Negative for abdominal pain, constipation and diarrhea.  Neurological: Negative for weakness.    Physical Exam:  Vitals:   12/11/17 1533 12/11/17 1535  BP:  138/78  Pulse:  72  SpO2:  99%  Weight: 231 lb 3.2 oz (104.9 kg) 231 lb 3.2 oz (104.9 kg)   General: very pleasant female, obese, well-developed, sitting up in chair in no acute distress Cardiac: regular rate and rhythm, nl S1/S2, no murmurs, rubs or gallops, no JVD  Pulm: CTAB, no wheezes or crackles, no increased work of breathing  Abd: soft, NTND, bowel sounds present Ext: warm and well perfused, no peripheral edema  Assessment & Plan:   See Encounters Tab for problem based charting.  Patient discussed with Dr. Angelia Mould

## 2017-12-12 NOTE — Assessment & Plan Note (Signed)
Patient has a long history of smoking.  Reports she is now down to 2 cigarettes a day from 2 packs a day.  Discussed importance of smoking cessation in the setting of cardiac disease.  Offered NRT and quit line information, but patient declined and states she is not ready to quit but will work on it.  - Encouraged smoking cessation  - Advised patient to call clinic if she is interested in NRTs or quit line information

## 2017-12-14 ENCOUNTER — Encounter (HOSPITAL_COMMUNITY): Payer: Self-pay

## 2017-12-16 ENCOUNTER — Encounter (HOSPITAL_COMMUNITY)
Admission: RE | Admit: 2017-12-16 | Discharge: 2017-12-16 | Disposition: A | Discharge status: Home / Self Care | Payer: Self-pay | Source: Ambulatory Visit | Attending: Internal Medicine | Admitting: Internal Medicine

## 2017-12-16 DIAGNOSIS — Z955 Presence of coronary angioplasty implant and graft: Secondary | ICD-10-CM | POA: Insufficient documentation

## 2017-12-17 NOTE — Progress Notes (Signed)
Internal Medicine Clinic Attending  Case discussed with Dr. Santos-Sanchez at the time of the visit.  We reviewed the resident's history and exam and pertinent patient test results.  I agree with the assessment, diagnosis, and plan of care documented in the resident's note.    

## 2017-12-18 ENCOUNTER — Encounter (HOSPITAL_COMMUNITY)
Admission: RE | Admit: 2017-12-18 | Discharge: 2017-12-18 | Disposition: A | Discharge status: Home / Self Care | Payer: Self-pay | Source: Ambulatory Visit | Attending: Internal Medicine | Admitting: Internal Medicine

## 2017-12-21 ENCOUNTER — Encounter (HOSPITAL_COMMUNITY)
Admission: RE | Admit: 2017-12-21 | Discharge: 2017-12-21 | Disposition: A | Discharge status: Home / Self Care | Payer: Self-pay | Source: Ambulatory Visit | Attending: Internal Medicine | Admitting: Internal Medicine

## 2017-12-23 ENCOUNTER — Encounter (HOSPITAL_COMMUNITY): Payer: Self-pay

## 2017-12-25 ENCOUNTER — Encounter (HOSPITAL_COMMUNITY)
Admission: RE | Admit: 2017-12-25 | Discharge: 2017-12-25 | Disposition: A | Discharge status: Home / Self Care | Payer: Self-pay | Source: Ambulatory Visit | Attending: Internal Medicine | Admitting: Internal Medicine

## 2017-12-28 ENCOUNTER — Encounter (HOSPITAL_COMMUNITY)
Admission: RE | Admit: 2017-12-28 | Discharge: 2017-12-28 | Disposition: A | Discharge status: Home / Self Care | Payer: Self-pay | Source: Ambulatory Visit | Attending: Internal Medicine | Admitting: Internal Medicine

## 2017-12-30 ENCOUNTER — Encounter (HOSPITAL_COMMUNITY)
Admission: RE | Admit: 2017-12-30 | Discharge: 2017-12-30 | Disposition: A | Discharge status: Home / Self Care | Payer: Self-pay | Source: Ambulatory Visit | Attending: Internal Medicine | Admitting: Internal Medicine

## 2018-01-01 ENCOUNTER — Encounter (HOSPITAL_COMMUNITY): Payer: Self-pay

## 2018-01-01 DIAGNOSIS — Z955 Presence of coronary angioplasty implant and graft: Secondary | ICD-10-CM | POA: Insufficient documentation

## 2018-01-04 ENCOUNTER — Encounter (HOSPITAL_COMMUNITY): Payer: Self-pay

## 2018-01-06 ENCOUNTER — Encounter (HOSPITAL_COMMUNITY)
Admission: RE | Admit: 2018-01-06 | Discharge: 2018-01-06 | Disposition: A | Discharge status: Home / Self Care | Payer: No Typology Code available for payment source | Source: Ambulatory Visit | Attending: Internal Medicine | Admitting: Internal Medicine

## 2018-01-08 ENCOUNTER — Encounter (HOSPITAL_COMMUNITY): Payer: Self-pay

## 2018-01-11 ENCOUNTER — Encounter (HOSPITAL_COMMUNITY)
Admission: RE | Admit: 2018-01-11 | Discharge: 2018-01-11 | Disposition: A | Discharge status: Home / Self Care | Payer: Self-pay | Source: Ambulatory Visit | Attending: Internal Medicine | Admitting: Internal Medicine

## 2018-01-13 ENCOUNTER — Encounter (HOSPITAL_COMMUNITY)
Admission: RE | Admit: 2018-01-13 | Discharge: 2018-01-13 | Disposition: A | Discharge status: Home / Self Care | Payer: Self-pay | Source: Ambulatory Visit | Attending: Internal Medicine | Admitting: Internal Medicine

## 2018-01-15 ENCOUNTER — Encounter (HOSPITAL_COMMUNITY)
Admission: RE | Admit: 2018-01-15 | Discharge: 2018-01-15 | Disposition: A | Discharge status: Home / Self Care | Payer: Self-pay | Source: Ambulatory Visit | Attending: Internal Medicine | Admitting: Internal Medicine

## 2018-01-18 ENCOUNTER — Encounter (HOSPITAL_COMMUNITY): Payer: Self-pay

## 2018-01-20 ENCOUNTER — Encounter (HOSPITAL_COMMUNITY): Payer: Self-pay

## 2018-01-21 ENCOUNTER — Telehealth: Payer: Self-pay | Admitting: Internal Medicine

## 2018-01-21 MED ORDER — CLOPIDOGREL BISULFATE 75 MG PO TABS: 75.0000 mg | ORAL_TABLET | Freq: Every day | ORAL | 6 refills | Status: DC

## 2018-01-21 NOTE — Telephone Encounter (Signed)
New message     *STAT* If patient is at the pharmacy, call can be transferred to refill team.   1. Which medications need to be refilled? (please list name of each medication and dose if known) clopidogrel (PLAVIX) 75 MG tablet  2. Which pharmacy/location (including street and city if local pharmacy) is medication to be sent to?    [facility] [place], [address]     3. Do they need a 30 day or 90 day supply? [place]

## 2018-01-21 NOTE — Telephone Encounter (Signed)
Rx has been sent to the pharmacy electronically. ° °

## 2018-01-22 ENCOUNTER — Encounter (HOSPITAL_COMMUNITY): Payer: Self-pay

## 2018-01-25 ENCOUNTER — Encounter (HOSPITAL_COMMUNITY): Payer: Self-pay

## 2018-01-27 ENCOUNTER — Encounter (HOSPITAL_COMMUNITY): Payer: Self-pay

## 2018-01-29 ENCOUNTER — Encounter (HOSPITAL_COMMUNITY): Payer: Self-pay

## 2018-01-29 DIAGNOSIS — Z955 Presence of coronary angioplasty implant and graft: Secondary | ICD-10-CM | POA: Insufficient documentation

## 2018-02-01 ENCOUNTER — Encounter (HOSPITAL_COMMUNITY)
Admission: RE | Admit: 2018-02-01 | Discharge: 2018-02-01 | Disposition: A | Discharge status: Home / Self Care | Payer: No Typology Code available for payment source | Source: Ambulatory Visit | Attending: Internal Medicine | Admitting: Internal Medicine

## 2018-02-03 ENCOUNTER — Encounter (HOSPITAL_COMMUNITY): Payer: Self-pay

## 2018-02-05 ENCOUNTER — Encounter (HOSPITAL_COMMUNITY): Payer: Self-pay

## 2018-02-08 ENCOUNTER — Encounter (HOSPITAL_COMMUNITY): Payer: Self-pay

## 2018-02-10 ENCOUNTER — Encounter (HOSPITAL_COMMUNITY): Payer: Self-pay

## 2018-02-12 ENCOUNTER — Encounter (HOSPITAL_COMMUNITY): Payer: Self-pay

## 2018-02-15 ENCOUNTER — Encounter (HOSPITAL_COMMUNITY): Payer: Self-pay

## 2018-02-17 ENCOUNTER — Telehealth: Payer: Self-pay | Admitting: Gastroenterology

## 2018-02-17 ENCOUNTER — Encounter (HOSPITAL_COMMUNITY): Payer: Self-pay

## 2018-02-17 NOTE — Telephone Encounter (Signed)
Spoke to patient, for last week she has had intermittent rectal bleeding with some clots upon having bm's. Patient has a history of hemorrhoids with 3 previous bandings. I have scheduled patient for 1st available hemorrhoid banding which is not until 04/20/18. I have also asked patient to come in for office visit with APP this Friday, 3/22 to evaluate patient see if banding is needed vs other therapy/testing. Patient denies any shortness of breath or dizziness.

## 2018-02-18 ENCOUNTER — Other Ambulatory Visit: Payer: Self-pay | Admitting: *Deleted

## 2018-02-18 MED ORDER — OLOPATADINE HCL 0.2 % OP SOLN: 1.0000 [drp] | Freq: Every day | OPHTHALMIC | 3 refills | Status: DC | PRN

## 2018-02-18 MED ORDER — LEVOTHYROXINE SODIUM 100 MCG PO TABS
100.0000 ug | ORAL_TABLET | Freq: Every day | ORAL | 1 refills | Status: DC
Start: 2018-02-18 — End: 2018-08-23

## 2018-02-19 ENCOUNTER — Encounter (HOSPITAL_COMMUNITY): Payer: Self-pay

## 2018-02-19 ENCOUNTER — Ambulatory Visit: Payer: No Typology Code available for payment source | Admitting: Nurse Practitioner

## 2018-02-22 ENCOUNTER — Encounter (HOSPITAL_COMMUNITY): Payer: Self-pay

## 2018-02-24 ENCOUNTER — Encounter (HOSPITAL_COMMUNITY): Payer: Self-pay

## 2018-02-26 ENCOUNTER — Encounter (HOSPITAL_COMMUNITY): Payer: Self-pay

## 2018-03-01 ENCOUNTER — Encounter (HOSPITAL_COMMUNITY): Payer: Self-pay | Attending: Internal Medicine

## 2018-03-01 DIAGNOSIS — Z955 Presence of coronary angioplasty implant and graft: Secondary | ICD-10-CM | POA: Insufficient documentation

## 2018-03-03 ENCOUNTER — Encounter (HOSPITAL_COMMUNITY): Payer: Self-pay

## 2018-03-03 ENCOUNTER — Other Ambulatory Visit: Payer: Self-pay | Admitting: *Deleted

## 2018-03-03 MED ORDER — MOMETASONE FUROATE 50 MCG/ACT NA SUSP
2.0000 | Freq: Every day | NASAL | 2 refills | Status: DC | PRN
Start: 2018-03-03 — End: 2018-05-05

## 2018-03-05 ENCOUNTER — Encounter: Payer: Self-pay | Admitting: Physician Assistant

## 2018-03-05 ENCOUNTER — Other Ambulatory Visit (INDEPENDENT_AMBULATORY_CARE_PROVIDER_SITE_OTHER): Payer: No Typology Code available for payment source

## 2018-03-05 ENCOUNTER — Encounter (HOSPITAL_COMMUNITY): Payer: Self-pay

## 2018-03-05 ENCOUNTER — Ambulatory Visit (INDEPENDENT_AMBULATORY_CARE_PROVIDER_SITE_OTHER): Payer: No Typology Code available for payment source | Admitting: Physician Assistant

## 2018-03-05 VITALS — BP 132/80 | HR 84 | Ht 67.0 in | Wt 233.0 lb

## 2018-03-05 DIAGNOSIS — K625 Hemorrhage of anus and rectum: Secondary | ICD-10-CM

## 2018-03-05 DIAGNOSIS — Z800 Family history of malignant neoplasm of digestive organs: Secondary | ICD-10-CM

## 2018-03-05 DIAGNOSIS — K648 Other hemorrhoids: Secondary | ICD-10-CM

## 2018-03-05 DIAGNOSIS — Z860100 Personal history of colon polyps, unspecified: Secondary | ICD-10-CM

## 2018-03-05 LAB — COMPREHENSIVE METABOLIC PANEL
ALT: 18 U/L (ref 0–35)
AST: 16 U/L (ref 0–37)
Albumin: 3.9 g/dL (ref 3.5–5.2)
Alkaline Phosphatase: 95 U/L (ref 39–117)
BILIRUBIN TOTAL: 0.3 mg/dL (ref 0.2–1.2)
BUN: 12 mg/dL (ref 6–23)
CO2: 28 meq/L (ref 19–32)
CREATININE: 0.89 mg/dL (ref 0.40–1.20)
Calcium: 9 mg/dL (ref 8.4–10.5)
Chloride: 107 mEq/L (ref 96–112)
GFR: 82.69 mL/min (ref >60.00)
GLUCOSE: 95 mg/dL (ref 70–99)
Potassium: 3.4 mEq/L — ABNORMAL LOW (ref 3.5–5.1)
SODIUM: 142 meq/L (ref 135–145)
Total Protein: 6.8 g/dL (ref 6.0–8.3)

## 2018-03-05 LAB — CBC WITH DIFFERENTIAL/PLATELET
BASOS PCT: 1.5 % (ref 0.0–3.0)
Basophils Absolute: 0.1 10*3/uL (ref 0.0–0.1)
EOS ABS: 0.2 10*3/uL (ref 0.0–0.7)
Eosinophils Relative: 3.8 % (ref 0.0–5.0)
HCT: 42.1 % (ref 36.0–46.0)
Hemoglobin: 14.2 g/dL (ref 12.0–15.0)
LYMPHS ABS: 3.4 10*3/uL (ref 0.7–4.0)
Lymphocytes Relative: 54.4 % — ABNORMAL HIGH (ref 12.0–46.0)
MCHC: 33.7 g/dL (ref 30.0–36.0)
MCV: 92 fl (ref 78.0–100.0)
Monocytes Absolute: 0.4 10*3/uL (ref 0.1–1.0)
Monocytes Relative: 6.8 % (ref 3.0–12.0)
NEUTROS ABS: 2.1 10*3/uL (ref 1.4–7.7)
NEUTROS PCT: 33.5 % — AB (ref 43.0–77.0)
PLATELETS: 337 10*3/uL (ref 150.0–400.0)
RBC: 4.57 Mil/uL (ref 3.87–5.11)
RDW: 14.4 % (ref 11.5–15.5)
WBC: 6.3 10*3/uL (ref 4.0–10.5)

## 2018-03-05 LAB — LIPID PANEL
CHOL/HDL RATIO: 3.7 ratio (ref 0.0–4.4)
CHOLESTEROL TOTAL: 139 mg/dL (ref 100–199)
HDL: 38 mg/dL — ABNORMAL LOW (ref >39)
LDL CALC: 85 mg/dL (ref 0–99)
Triglycerides: 80 mg/dL (ref 0–149)
VLDL Cholesterol Cal: 16 mg/dL (ref 5–40)

## 2018-03-05 MED ORDER — HYDROCORTISONE 2.5 % RE CREA: 1.0000 "application " | TOPICAL_CREAM | Freq: Two times a day (BID) | RECTAL | 1 refills | Status: DC

## 2018-03-05 NOTE — Patient Instructions (Signed)
Your provider suggests that you use Hydrocortisone ointment to treat your symptoms. This can be applied to a glycerin suppository which can be purchased over the counter. Apply the suppository into the rectum twice daily for the next 7- 14 days.   Try a daily fiber supplement such as Metamucil or Citrucel.

## 2018-03-05 NOTE — Progress Notes (Signed)
Chief Complaint: Rectal bleeding  HPI:    Emma Bonilla is a 62 year old AA female with a past medical history as listed below including cardiac stent in November on Plavix, who follows with Dr. Havery Moros and presents to clinic today with a complaint of hemorrhoids and rectal bleeding.    01/26/17 colonoscopy for personal history of adenomatous colon polyps and family history of colon cancer in her sister.  Nonthrombosed external hemorrhoids, 4 mm polyp in the cecum, 4 mm polyp in the transverse colon, diminutive polyp at splenic flexure, 3 3-5 mm polyps in the rectum and internal hemorrhoids and otherwise normal.    07/07/17 office visit with Dr. Havery Moros for final hemorrhoid banding.    Today, explains that she has had a small amount of rectal bleeding since November/ December of last year.  She then underwent cardiac stent placement in Nov and was placed on Plavix.  Since then patient has seen a slightly increased amount of bright red blood occasionally with some clotting which occurs 2-3 days out of the week over the past month.  Describes sometimes she will just pass gas and it will be blood.  Associated symptoms include a small amount of left lower quadrant abdominal pain which is intermittent, none today.    Does describe inconsistency in her stool between constipation and diarrhea, pending on diet.    Describes palpitations, some shortness of breath and lightheadedness recently.    Social history positive for a lot of stress and anxiety when her sister passed from colon cancer in November of last year.    Denies fever, chills, weight loss, anorexia, nausea, vomiting, heartburn, reflux, rectal pain or symptoms that awaken her at night.  Past Medical History:  Diagnosis Date  . Arthritis   . Depression   . Diastolic dysfunction without heart failure   . GERD (gastroesophageal reflux disease)   . Hepatic steatosis    a. seen on prior US.  Marland Kitchen Hyperlipidemia   . Hypertension   . Mild  aortic regurgitation   . Mild aortic stenosis   . Premature atrial contractions   . PVC's (premature ventricular contractions)   . Thyroid disease   . TIA (transient ischemic attack)    2010  . Tobacco abuse     Past Surgical History:  Procedure Laterality Date  . CORONARY STENT INTERVENTION N/A 10/13/2017   Procedure: CORONARY STENT INTERVENTION;  Surgeon: Leonie Man, MD;  Location: [facility];  Service: Cardiovascular;  Laterality: N/A;  . DILATION AND CURETTAGE OF UTERUS     30 years ago  . LEFT HEART CATH AND CORONARY ANGIOGRAPHY N/A 10/13/2017   Procedure: LEFT HEART CATH AND CORONARY ANGIOGRAPHY;  Surgeon: Leonie Man, MD;  Location: [facility];  Service: Cardiovascular;  Laterality: N/A;    Current Outpatient Medications  Medication Sig Dispense Refill  . amitriptyline (ELAVIL) 25 MG tablet Take 2 tablets (50 mg total) by mouth at bedtime. 90 tablet 3  . aspirin 81 MG tablet Take 1 tablet (81 mg total) by mouth daily. 30 tablet 3  . atorvastatin (LIPITOR) 40 MG tablet Take 1 tablet (40 mg total) by mouth daily at 6 PM. 90 tablet 3  . Biotin 5000 MCG CAPS Take by mouth.    . calcium-vitamin D (OSCAL WITH D) 500-200 MG-UNIT per tablet Take 1 tablet by mouth daily. (Patient taking differently: Take 1 tablet by mouth 2 (two) times daily. ) 30 tablet 3  . cetirizine (ZYRTEC) 10 MG tablet  Take 1 tablet (10 mg total) by mouth daily. 60 tablet 3  . clopidogrel (PLAVIX) 75 MG tablet Take 1 tablet (75 mg total) by mouth daily with breakfast. 30 tablet 6  . ferrous sulfate 325 (65 FE) MG tablet Take 1 tablet (325 mg total) by mouth daily. 30 tablet 2  . levothyroxine (SYNTHROID) 100 MCG tablet Take 1 tablet (100 mcg total) by mouth daily. 90 tablet 1  . losartan-hydrochlorothiazide (HYZAAR) 50-12.5 MG tablet Take 2 tablets by mouth daily. 60 tablet 5  . Melatonin 10 MG CAPS Take by mouth.    . Menthol, Topical Analgesic, (STOPAIN) 8 % LIQD Apply 1 application  daily as needed topically (knee and ankle pain).    . metoprolol succinate (TOPROL-XL) 25 MG 24 hr tablet Take 0.5 tablets (12.5 mg total) daily by mouth. 15 tablet 2  . mometasone (NASONEX) 50 MCG/ACT nasal spray Place 2 sprays into the nose daily as needed. 17 g 2  . Olopatadine HCl 0.2 % SOLN Apply 1 drop to eye daily as needed. 3 Bottle 3  . pantoprazole (PROTONIX) 20 MG tablet Take 1 tablet (20 mg total) by mouth daily. 30 tablet 2   No current facility-administered medications for this visit.     Allergies as of 03/05/2018 - Review Complete 03/05/2018  Allergen Reaction Noted  . Naproxen sodium Rash 07/25/2011    Family History  Problem Relation Age of Onset  . Hypertension Mother   . Emphysema Mother   . Cancer Mother        lung  . Heart failure Mother        CHF  . Heart disease Father        MI at 30  . Hypertension Father   . Crohn's disease Father   . Heart disease Sister        LVAD (2014) - MI in her 90s  . Hypertension Sister   . Colon cancer Sister        8 Mets  . Kidney cancer Sister   . Skin cancer Maternal Grandmother   . Stroke Maternal Grandfather   . Diabetes Brother   . Valvular heart disease Sister        leaky valve  . Hypertension Sister   . Hyperlipidemia Sister   . Hypertension Sister   . Crohn's disease Sister   . Hypertension Child   . Hyperlipidemia Child   . Hyperlipidemia Child     Social History   Socioeconomic History  . Marital status: Married    Spouse name: Not on file  . Number of children: 4  . Years of education: 57  . Highest education level: Not on file  Occupational History  . Not on file  Social Needs  . Financial resource strain: Not on file  . Food insecurity:    Worry: Not on file    Inability: Not on file  . Transportation needs:    Medical: Not on file    Non-medical: Not on file  Tobacco Use  . Smoking status: Current Every Day Smoker    Packs/day: 0.50    Years: 45.00    Pack years: 22.50     Types: Cigarettes  . Smokeless tobacco: Never Used  . Tobacco comment: 1/2 pack or less  Substance and Sexual Activity  . Alcohol use: Yes    Comment: holidays and special occasion  . Drug use: Yes    Frequency: 1.0 times per week    Types: Marijuana  Comment: once weekly or once every 2 weeks  . Sexual activity: Yes    Birth control/protection: None  Lifestyle  . Physical activity:    Days per week: Not on file    Minutes per session: Not on file  . Stress: Not on file  Relationships  . Social connections:    Talks on phone: Not on file    Gets together: Not on file    Attends religious service: Not on file    Active member of club or organization: Not on file    Attends meetings of clubs or organizations: Not on file    Relationship status: Not on file  . Intimate partner violence:    Fear of current or ex partner: Not on file    Emotionally abused: Not on file    Physically abused: Not on file    Forced sexual activity: Not on file  Other Topics Concern  . Not on file  Social History Narrative   Epworth Sleepiness Scale (08/21/15) = 9    Review of Systems:    Constitutional: No weight loss, fever or chills Cardiovascular: No chest pain Respiratory: No SOB  Gastrointestinal: See HPI and otherwise negative   Physical Exam:  Vital signs: BP 132/80   Pulse 84   Ht 5\' 7"  (1.702 m)   Wt 233 lb (105.7 kg)   LMP 02/11/2011 Comment: no chance of pregnancy per pt  BMI 36.49 kg/m   Constitutional:   Pleasant overweight AA female appears to be in NAD, Well developed, Well nourished, alert and cooperative. Respiratory: Respirations even and unlabored. Lungs clear to auscultation bilaterally.   No wheezes, crackles, or rhonchi.  Cardiovascular: Normal S1, S2. No MRG. Regular rate and rhythm. No peripheral edema, cyanosis or pallor.  Gastrointestinal:  Soft, nondistended, nontender. No rebound or guarding. Normal bowel sounds. No appreciable masses or hepatomegaly. Rectal:   External: Nonthrombosed external hemorrhoids; internal: Grade 1 internal hemorrhoids with visible signs of recent bleeding Psychiatric:Demonstrates good judgement and reason without abnormal affect or behaviors.  No recent labs.  Assessment: 1.  Internal hemorrhoids: Banded x3 last year, seen again at time of exam today with signs of recent bleeding 2.  Rectal bleeding: Likely with above 3.  Family history of colon cancer: 01/2017 last colonoscopy with finding of adenomatous colon polyps, repeat recommended in 3 years 4.  Variance in stool: Between diarrhea and constipation  Plan: 1.  Discussed with patient that she had a recent colonoscopy and I do not feel the need to repeat at this time.  She does describe a degree of anxiety over this with her sister just having passed with colon cancer. 2.  Prescribed Hydrocortisone ointment to be applied to glycerin suppositories twice daily for a week, repeat for another week if continues with symptoms. 3.  Ordered CBC and CMP today 4.  Discussed the patient that if she is anxious over the next month or has an increase in bleeding would recommend she call our clinic.  She can have CT of the abdomen and pelvis with contrast if she becomes worried or anxious.  If she continues with bleeding would recommend she go to the ER. 5.  Also recommend increasing fiber to 25-35 g/day with use of fiber supplement such as Metamucil, Citrucel or Benefiber. 6.  Patient to follow in clinic with Dr. Havery Moros as previously scheduled next month.  Ellouise Newer, PA-C [facility] 03/05/2018, 9:51 AM  Cc: Welford Roche,*

## 2018-03-08 ENCOUNTER — Encounter (HOSPITAL_COMMUNITY): Payer: Self-pay

## 2018-03-08 NOTE — Progress Notes (Signed)
Agree with assessment and plan as outlined.  Sounds like she has recurrent hemorrhoidal bleeding in the setting of Plavix. Unfortunately she is not a good candidate for further banding in the setting of plavix use, this would need to be held for a period of time which is likely not safe to do with relatively recent coronary stenting. Agree with conservative management for now. If symptoms persist she would need approval from her cardiologist to come off plavix for a period of time prior to consideration for banding.

## 2018-03-10 ENCOUNTER — Other Ambulatory Visit: Payer: Self-pay | Admitting: Internal Medicine

## 2018-03-10 ENCOUNTER — Encounter (HOSPITAL_COMMUNITY): Payer: Self-pay

## 2018-03-10 MED ORDER — EZETIMIBE 10 MG PO TABS: 10.0000 mg | ORAL_TABLET | Freq: Every day | ORAL | 3 refills | Status: DC

## 2018-03-10 NOTE — Telephone Encounter (Signed)
Patient called w/lab results. She is agreeable to starting zetia 10mg  QD. She would like this Rx sent to [facility]. Rx(s) sent to pharmacy electronically.

## 2018-03-12 ENCOUNTER — Encounter (HOSPITAL_COMMUNITY): Payer: Self-pay

## 2018-03-15 ENCOUNTER — Telehealth: Payer: Self-pay | Admitting: *Deleted

## 2018-03-15 ENCOUNTER — Encounter (HOSPITAL_COMMUNITY): Payer: Self-pay

## 2018-03-15 NOTE — Telephone Encounter (Signed)
Received fax from [facility] with the following: "Losartan-HCTZ 50-12.5 mg (take 2 tabs daily) on back order, we have drug available separately as losartan 100 mg and HCTZ 25 mg and pt can take one tab of each. It's also cheaper."  If this is acceptable please send new Rxs to [facility]. Hubbard Hartshorn, RN, BSN

## 2018-03-16 NOTE — Telephone Encounter (Signed)
Patient called in stating she has been out of this med since 03/07/2018. Hubbard Hartshorn, RN, BSN

## 2018-03-17 ENCOUNTER — Other Ambulatory Visit: Payer: Self-pay | Admitting: Internal Medicine

## 2018-03-17 ENCOUNTER — Encounter (HOSPITAL_COMMUNITY): Payer: Self-pay

## 2018-03-17 MED ORDER — HYDROCHLOROTHIAZIDE 12.5 MG PO CAPS: 25.0000 mg | ORAL_CAPSULE | Freq: Every day | ORAL | 3 refills | Status: DC

## 2018-03-17 MED ORDER — LOSARTAN POTASSIUM 100 MG PO TABS: 100.0000 mg | ORAL_TABLET | Freq: Every day | ORAL | 3 refills | Status: DC

## 2018-03-17 NOTE — Telephone Encounter (Signed)
Prescription for losartan 100 mg QD and HCTZ 25 mg QD sent to [facility].

## 2018-03-19 ENCOUNTER — Encounter (HOSPITAL_COMMUNITY): Payer: Self-pay

## 2018-03-22 ENCOUNTER — Telehealth: Payer: Self-pay | Admitting: *Deleted

## 2018-03-22 ENCOUNTER — Encounter (HOSPITAL_COMMUNITY): Payer: Self-pay

## 2018-03-22 ENCOUNTER — Other Ambulatory Visit: Payer: Self-pay | Admitting: *Deleted

## 2018-03-22 MED ORDER — PANTOPRAZOLE SODIUM 20 MG PO TBEC: 20.0000 mg | DELAYED_RELEASE_TABLET | Freq: Every day | ORAL | 2 refills | Status: DC

## 2018-03-22 NOTE — Telephone Encounter (Signed)
Fax from [facility] 50/12.5 is on back order; please change and send new rx . Thanks

## 2018-03-22 NOTE — Telephone Encounter (Signed)
[facility] calls and states pt has been given 3 fills of pantoprazole 40mg - jan, feb, march- instead of 20mg , [facility]d does not stock 20mg  only 40mg . Do you want to continue the 40mg  or change to something else?

## 2018-03-23 NOTE — Telephone Encounter (Signed)
Sent new rx a few days ago. Need me to resend?

## 2018-03-23 NOTE — Telephone Encounter (Signed)
No -  I did not realized the change was done; I will inform [facility].

## 2018-03-23 NOTE — Telephone Encounter (Signed)
She is supposed to be on 20 daily, not sure why she is taking 40 mg. I can switch her to a different PPI. Did they mention what they have on formulary? I just called them but not able to reach them.

## 2018-03-24 ENCOUNTER — Encounter (HOSPITAL_COMMUNITY): Payer: Self-pay

## 2018-03-26 ENCOUNTER — Encounter (HOSPITAL_COMMUNITY): Payer: Self-pay

## 2018-03-29 ENCOUNTER — Encounter (HOSPITAL_COMMUNITY): Payer: Self-pay

## 2018-03-29 ENCOUNTER — Other Ambulatory Visit: Payer: Self-pay | Admitting: Internal Medicine

## 2018-03-29 NOTE — Telephone Encounter (Signed)
Called [facility], dr Isac Sarna refilled 4/22, they got script and will fill and call pt

## 2018-03-29 NOTE — Telephone Encounter (Signed)
PATIENT NEEDS REFILL ON PANTOPRAZOLE 40MG , [facility] DEPT [phone]

## 2018-03-31 ENCOUNTER — Other Ambulatory Visit: Payer: Self-pay | Admitting: *Deleted

## 2018-03-31 MED ORDER — FERROUS SULFATE 325 (65 FE) MG PO TABS: 325.0000 mg | ORAL_TABLET | Freq: Every day | ORAL | 3 refills | Status: AC

## 2018-04-12 ENCOUNTER — Other Ambulatory Visit: Payer: Self-pay | Admitting: Internal Medicine

## 2018-04-12 NOTE — Telephone Encounter (Signed)
°*  STAT* If patient is at the pharmacy, call can be transferred to refill team.   1. Which medications need to be refilled? (please list name of each medication and dose if known) metoprolol succinate (TOPROL-XL) 25 MG 24 hr tablet [[id number]]   2. Which pharmacy/location (including street and city if local pharmacy) is medication to be sent to? [facility]   3. Do they need a 30 day or 90 day supply? [place]

## 2018-04-13 MED ORDER — METOPROLOL SUCCINATE ER 25 MG PO TB24: 12.5000 mg | ORAL_TABLET | Freq: Every day | ORAL | 7 refills | Status: DC

## 2018-04-13 NOTE — Telephone Encounter (Signed)
Rx(s) sent to pharmacy electronically.  

## 2018-04-16 ENCOUNTER — Observation Stay (HOSPITAL_COMMUNITY)
Admission: EM | Admit: 2018-04-16 | Discharge: 2018-04-17 | Disposition: A | Discharge status: Home / Self Care | Payer: Self-pay | Attending: Internal Medicine | Admitting: Internal Medicine

## 2018-04-16 ENCOUNTER — Emergency Department (HOSPITAL_COMMUNITY): Payer: Self-pay

## 2018-04-16 ENCOUNTER — Encounter (HOSPITAL_COMMUNITY): Payer: Self-pay | Admitting: Emergency Medicine

## 2018-04-16 DIAGNOSIS — F329 Major depressive disorder, single episode, unspecified: Secondary | ICD-10-CM | POA: Insufficient documentation

## 2018-04-16 DIAGNOSIS — I491 Atrial premature depolarization: Secondary | ICD-10-CM | POA: Insufficient documentation

## 2018-04-16 DIAGNOSIS — E785 Hyperlipidemia, unspecified: Secondary | ICD-10-CM | POA: Insufficient documentation

## 2018-04-16 DIAGNOSIS — F17210 Nicotine dependence, cigarettes, uncomplicated: Secondary | ICD-10-CM | POA: Insufficient documentation

## 2018-04-16 DIAGNOSIS — R079 Chest pain, unspecified: Secondary | ICD-10-CM | POA: Diagnosis present

## 2018-04-16 DIAGNOSIS — R0789 Other chest pain: Principal | ICD-10-CM | POA: Insufficient documentation

## 2018-04-16 DIAGNOSIS — I2510 Atherosclerotic heart disease of native coronary artery without angina pectoris: Secondary | ICD-10-CM | POA: Insufficient documentation

## 2018-04-16 DIAGNOSIS — F4321 Adjustment disorder with depressed mood: Secondary | ICD-10-CM | POA: Diagnosis present

## 2018-04-16 DIAGNOSIS — E039 Hypothyroidism, unspecified: Secondary | ICD-10-CM | POA: Insufficient documentation

## 2018-04-16 DIAGNOSIS — Z8673 Personal history of transient ischemic attack (TIA), and cerebral infarction without residual deficits: Secondary | ICD-10-CM | POA: Insufficient documentation

## 2018-04-16 DIAGNOSIS — M1990 Unspecified osteoarthritis, unspecified site: Secondary | ICD-10-CM | POA: Insufficient documentation

## 2018-04-16 DIAGNOSIS — I350 Nonrheumatic aortic (valve) stenosis: Secondary | ICD-10-CM | POA: Insufficient documentation

## 2018-04-16 DIAGNOSIS — Z9861 Coronary angioplasty status: Secondary | ICD-10-CM

## 2018-04-16 DIAGNOSIS — Z7902 Long term (current) use of antithrombotics/antiplatelets: Secondary | ICD-10-CM | POA: Insufficient documentation

## 2018-04-16 DIAGNOSIS — Z6835 Body mass index (BMI) 35.0-35.9, adult: Secondary | ICD-10-CM | POA: Insufficient documentation

## 2018-04-16 DIAGNOSIS — I10 Essential (primary) hypertension: Secondary | ICD-10-CM | POA: Insufficient documentation

## 2018-04-16 DIAGNOSIS — Z8249 Family history of ischemic heart disease and other diseases of the circulatory system: Secondary | ICD-10-CM | POA: Insufficient documentation

## 2018-04-16 DIAGNOSIS — K219 Gastro-esophageal reflux disease without esophagitis: Secondary | ICD-10-CM | POA: Insufficient documentation

## 2018-04-16 DIAGNOSIS — E669 Obesity, unspecified: Secondary | ICD-10-CM | POA: Insufficient documentation

## 2018-04-16 DIAGNOSIS — E876 Hypokalemia: Secondary | ICD-10-CM | POA: Insufficient documentation

## 2018-04-16 DIAGNOSIS — F4329 Adjustment disorder with other symptoms: Secondary | ICD-10-CM | POA: Diagnosis present

## 2018-04-16 DIAGNOSIS — Z7982 Long term (current) use of aspirin: Secondary | ICD-10-CM | POA: Insufficient documentation

## 2018-04-16 DIAGNOSIS — Z955 Presence of coronary angioplasty implant and graft: Secondary | ICD-10-CM | POA: Insufficient documentation

## 2018-04-16 LAB — BASIC METABOLIC PANEL
Anion gap: 8 (ref 5–15)
BUN: 12 mg/dL (ref 6–20)
CALCIUM: 9.4 mg/dL (ref 8.9–10.3)
CO2: 29 mmol/L (ref 22–32)
CREATININE: 1.12 mg/dL — AB (ref 0.44–1.00)
Chloride: 104 mmol/L (ref 101–111)
GFR calc non Af Amer: 52 mL/min — ABNORMAL LOW (ref >60)
Glucose, Bld: 129 mg/dL — ABNORMAL HIGH (ref 65–99)
Potassium: 3.2 mmol/L — ABNORMAL LOW (ref 3.5–5.1)
SODIUM: 141 mmol/L (ref 135–145)

## 2018-04-16 LAB — CBC
HCT: 45.2 % (ref 36.0–46.0)
Hemoglobin: 14.8 g/dL (ref 12.0–15.0)
MCH: 29.7 pg (ref 26.0–34.0)
MCHC: 32.7 g/dL (ref 30.0–36.0)
MCV: 90.8 fL (ref 78.0–100.0)
PLATELETS: 329 10*3/uL (ref 150–400)
RBC: 4.98 MIL/uL (ref 3.87–5.11)
RDW: 13.8 % (ref 11.5–15.5)
WBC: 6.4 10*3/uL (ref 4.0–10.5)

## 2018-04-16 LAB — I-STAT TROPONIN, ED: TROPONIN I, POC: 0 ng/mL (ref 0.00–0.08)

## 2018-04-16 NOTE — ED Triage Notes (Signed)
Patient complains of sharp chest pain that started last night around 1900. States the pain changed over night so that today it feels like she also has indigestion. Reports having two cardiac stents placed in November 2018, states the current episode feels similar to the event in November. Patient alert and oriented and in no apparent distress at this time.

## 2018-04-17 ENCOUNTER — Encounter (HOSPITAL_COMMUNITY): Payer: Self-pay | Admitting: Internal Medicine

## 2018-04-17 ENCOUNTER — Other Ambulatory Visit: Payer: Self-pay

## 2018-04-17 DIAGNOSIS — I350 Nonrheumatic aortic (valve) stenosis: Secondary | ICD-10-CM

## 2018-04-17 DIAGNOSIS — I25119 Atherosclerotic heart disease of native coronary artery with unspecified angina pectoris: Secondary | ICD-10-CM

## 2018-04-17 DIAGNOSIS — Z886 Allergy status to analgesic agent status: Secondary | ICD-10-CM

## 2018-04-17 DIAGNOSIS — Z8249 Family history of ischemic heart disease and other diseases of the circulatory system: Secondary | ICD-10-CM

## 2018-04-17 DIAGNOSIS — R079 Chest pain, unspecified: Secondary | ICD-10-CM | POA: Diagnosis present

## 2018-04-17 DIAGNOSIS — E039 Hypothyroidism, unspecified: Secondary | ICD-10-CM

## 2018-04-17 DIAGNOSIS — R011 Cardiac murmur, unspecified: Secondary | ICD-10-CM

## 2018-04-17 DIAGNOSIS — Z83438 Family history of other disorder of lipoprotein metabolism and other lipidemia: Secondary | ICD-10-CM

## 2018-04-17 DIAGNOSIS — Z7982 Long term (current) use of aspirin: Secondary | ICD-10-CM

## 2018-04-17 DIAGNOSIS — E785 Hyperlipidemia, unspecified: Secondary | ICD-10-CM

## 2018-04-17 DIAGNOSIS — R142 Eructation: Secondary | ICD-10-CM

## 2018-04-17 DIAGNOSIS — I10 Essential (primary) hypertension: Secondary | ICD-10-CM

## 2018-04-17 DIAGNOSIS — Z955 Presence of coronary angioplasty implant and graft: Secondary | ICD-10-CM

## 2018-04-17 DIAGNOSIS — Z7902 Long term (current) use of antithrombotics/antiplatelets: Secondary | ICD-10-CM

## 2018-04-17 DIAGNOSIS — K219 Gastro-esophageal reflux disease without esophagitis: Secondary | ICD-10-CM

## 2018-04-17 DIAGNOSIS — R0789 Other chest pain: Secondary | ICD-10-CM | POA: Diagnosis present

## 2018-04-17 DIAGNOSIS — Z79899 Other long term (current) drug therapy: Secondary | ICD-10-CM

## 2018-04-17 DIAGNOSIS — E876 Hypokalemia: Secondary | ICD-10-CM

## 2018-04-17 DIAGNOSIS — Z8673 Personal history of transient ischemic attack (TIA), and cerebral infarction without residual deficits: Secondary | ICD-10-CM

## 2018-04-17 DIAGNOSIS — F17210 Nicotine dependence, cigarettes, uncomplicated: Secondary | ICD-10-CM

## 2018-04-17 LAB — BASIC METABOLIC PANEL
ANION GAP: 9 (ref 5–15)
BUN: 9 mg/dL (ref 6–20)
CALCIUM: 8.9 mg/dL (ref 8.9–10.3)
CHLORIDE: 103 mmol/L (ref 101–111)
CO2: 29 mmol/L (ref 22–32)
Creatinine, Ser: 0.88 mg/dL (ref 0.44–1.00)
GFR calc non Af Amer: >60 mL/min (ref >60)
GLUCOSE: 130 mg/dL — AB (ref 65–99)
POTASSIUM: 3.5 mmol/L (ref 3.5–5.1)
Sodium: 141 mmol/L (ref 135–145)

## 2018-04-17 LAB — MAGNESIUM: Magnesium: 1.9 mg/dL (ref 1.7–2.4)

## 2018-04-17 LAB — I-STAT TROPONIN, ED: TROPONIN I, POC: 0 ng/mL (ref 0.00–0.08)

## 2018-04-17 LAB — TROPONIN I: Troponin I: <0.03 ng/mL (ref <0.03)

## 2018-04-17 MED ORDER — CLOPIDOGREL BISULFATE 75 MG PO TABS
75.0000 mg | ORAL_TABLET | Freq: Every day | ORAL | Status: DC
  Administered 2018-04-17: 75 mg via ORAL
  Filled 2018-04-17: qty 1

## 2018-04-17 MED ORDER — POTASSIUM CHLORIDE 20 MEQ/15ML (10%) PO SOLN
40.0000 meq | Freq: Once | ORAL | Status: AC
  Administered 2018-04-17: 40 meq via ORAL
  Filled 2018-04-17: qty 30

## 2018-04-17 MED ORDER — SODIUM CHLORIDE 0.9% FLUSH
3.0000 mL | Freq: Two times a day (BID) | INTRAVENOUS | Status: DC
  Administered 2018-04-17 (×2): 3 mL via INTRAVENOUS

## 2018-04-17 MED ORDER — FERROUS SULFATE 325 (65 FE) MG PO TABS
325.0000 mg | ORAL_TABLET | Freq: Every day | ORAL | Status: DC
  Administered 2018-04-17: 325 mg via ORAL
  Filled 2018-04-17: qty 1

## 2018-04-17 MED ORDER — PANTOPRAZOLE SODIUM 20 MG PO TBEC
20.0000 mg | DELAYED_RELEASE_TABLET | Freq: Every day | ORAL | Status: DC
Start: 2018-04-17 — End: 2018-04-17
  Filled 2018-04-17 (×2): qty 1

## 2018-04-17 MED ORDER — METOPROLOL SUCCINATE 12.5 MG HALF TABLET
12.5000 mg | ORAL_TABLET | Freq: Every day | ORAL | Status: DC
  Administered 2018-04-17: 12.5 mg via ORAL
  Filled 2018-04-17 (×2): qty 1

## 2018-04-17 MED ORDER — ATORVASTATIN CALCIUM 40 MG PO TABS
40.0000 mg | ORAL_TABLET | Freq: Every day | ORAL | Status: DC
  Administered 2018-04-17: 40 mg via ORAL
  Filled 2018-04-17: qty 1

## 2018-04-17 MED ORDER — HYDROCHLOROTHIAZIDE 12.5 MG PO CAPS
25.0000 mg | ORAL_CAPSULE | Freq: Every day | ORAL | Status: DC
  Administered 2018-04-17: 25 mg via ORAL
  Filled 2018-04-17 (×2): qty 2

## 2018-04-17 MED ORDER — ASPIRIN 81 MG PO CHEW
324.0000 mg | CHEWABLE_TABLET | Freq: Once | ORAL | Status: AC
  Administered 2018-04-17: 324 mg via ORAL
  Filled 2018-04-17: qty 4

## 2018-04-17 MED ORDER — ASPIRIN EC 81 MG PO TBEC: 81.0000 mg | DELAYED_RELEASE_TABLET | Freq: Every day | ORAL | Status: DC

## 2018-04-17 MED ORDER — NITROGLYCERIN 0.4 MG SL SUBL: 0.4000 mg | SUBLINGUAL_TABLET | SUBLINGUAL | Status: DC | PRN

## 2018-04-17 MED ORDER — ENOXAPARIN SODIUM 40 MG/0.4ML ~~LOC~~ SOLN: 40.0000 mg | SUBCUTANEOUS | Status: DC

## 2018-04-17 MED ORDER — LOSARTAN POTASSIUM 50 MG PO TABS
100.0000 mg | ORAL_TABLET | Freq: Every day | ORAL | Status: DC
  Administered 2018-04-17: 100 mg via ORAL
  Filled 2018-04-17: qty 2

## 2018-04-17 MED ORDER — AMITRIPTYLINE HCL 50 MG PO TABS
50.0000 mg | ORAL_TABLET | Freq: Every day | ORAL | Status: DC
  Administered 2018-04-17: 50 mg via ORAL
  Filled 2018-04-17 (×2): qty 1

## 2018-04-17 MED ORDER — EZETIMIBE 10 MG PO TABS
10.0000 mg | ORAL_TABLET | Freq: Every day | ORAL | Status: DC
  Administered 2018-04-17: 10 mg via ORAL
  Filled 2018-04-17: qty 1

## 2018-04-17 MED ORDER — POTASSIUM CHLORIDE CRYS ER 20 MEQ PO TBCR
40.0000 meq | EXTENDED_RELEASE_TABLET | Freq: Once | ORAL | Status: AC
  Administered 2018-04-17: 40 meq via ORAL
  Filled 2018-04-17: qty 2

## 2018-04-17 MED ORDER — ACETAMINOPHEN 325 MG PO TABS: 650.0000 mg | ORAL_TABLET | Freq: Four times a day (QID) | ORAL | Status: DC | PRN

## 2018-04-17 MED ORDER — ACETAMINOPHEN 650 MG RE SUPP: 650.0000 mg | Freq: Four times a day (QID) | RECTAL | Status: DC | PRN

## 2018-04-17 MED ORDER — LEVOTHYROXINE SODIUM 100 MCG PO TABS
100.0000 ug | ORAL_TABLET | Freq: Every day | ORAL | Status: DC
  Administered 2018-04-17: 100 ug via ORAL
  Filled 2018-04-17: qty 1

## 2018-04-17 MED ORDER — MELATONIN 3 MG PO TABS
3.0000 mg | ORAL_TABLET | Freq: Every day | ORAL | Status: DC
  Administered 2018-04-17: 3 mg via ORAL
  Filled 2018-04-17 (×2): qty 1

## 2018-04-17 NOTE — Discharge Summary (Signed)
Name: Emma Bonilla MRN: [id number] DOB: 11-Mar-1956 62 y.o. PCP: Welford Roche, MD  Date of Admission: 04/16/2018  1:47 PM Date of Discharge:  Attending Physician: Aldine Contes, MD  Discharge Diagnosis: 1. Chest pain   Discharge Medications: Allergies as of 04/17/2018      Reactions   Naproxen Sodium Rash      Medication List    TAKE these medications   amitriptyline 25 MG tablet Commonly known as:  ELAVIL Take 2 tablets (50 mg total) by mouth at bedtime.   aspirin 81 MG tablet Take 1 tablet (81 mg total) by mouth daily.   atorvastatin 40 MG tablet Commonly known as:  LIPITOR Take 1 tablet (40 mg total) by mouth daily at 6 PM.   calcium-vitamin D 500-200 MG-UNIT tablet Commonly known as:  OSCAL WITH D Take 1 tablet by mouth daily. What changed:  when to take this   cetirizine 10 MG tablet Commonly known as:  ZYRTEC Take 1 tablet (10 mg total) by mouth daily.   clopidogrel 75 MG tablet Commonly known as:  PLAVIX Take 1 tablet (75 mg total) by mouth daily with breakfast.   ezetimibe 10 MG tablet Commonly known as:  ZETIA Take 1 tablet (10 mg total) by mouth daily.   ferrous sulfate 325 (65 FE) MG tablet Take 1 tablet (325 mg total) by mouth daily.   hydrochlorothiazide 12.5 MG capsule Commonly known as:  MICROZIDE Take 2 capsules (25 mg total) by mouth daily.   hydrocortisone 2.5 % rectal cream Commonly known as:  ANUSOL-HC Place 1 application rectally 2 (two) times daily.   levothyroxine 100 MCG tablet Commonly known as:  SYNTHROID Take 1 tablet (100 mcg total) by mouth daily.   losartan 100 MG tablet Commonly known as:  COZAAR Take 1 tablet (100 mg total) by mouth daily.   Melatonin 3 MG Tabs Take 3 mg by mouth at bedtime.   metoprolol succinate 25 MG 24 hr tablet Commonly known as:  TOPROL-XL Take 0.5 tablets (12.5 mg total) by mouth daily.   mometasone 50 MCG/ACT nasal spray Commonly known as:  NASONEX Place 2 sprays into  the nose daily as needed.   Olopatadine HCl 0.2 % Soln Apply 1 drop to eye daily as needed.   pantoprazole 20 MG tablet Commonly known as:  PROTONIX Take 1 tablet (20 mg total) by mouth daily. What changed:  how much to take       Disposition and follow-up:   Emma Bonilla was discharged from [facility] in Stable condition.  At the hospital follow up visit please address:  1.  Chest Pain: ACS ruled out with serial troponins and EKGs. Atypical in nature, likely costochondritis vs. GERD. Instructed to follow up with cardiology & PCP.   2.  Labs / imaging needed at time of follow-up: None  3.  Pending labs/ test needing follow-up: None  Follow-up Appointments: Follow-up Information    Welford Roche, MD. Schedule an appointment as soon as possible for a visit in 2 week(s).   Specialty:  Internal Medicine Why:  Please call and schedule a follow up appointment in the next 1-2 weeks.  Contact information: [place] [address] [phone]        Pixie Casino, MD. Schedule an appointment as soon as possible for a visit.   Specialty:  Cardiology Contact information: [address] [place] [address] [phone]  Hospital Course by problem list:  1. Chest Pain: Patient presented with sudden onset midsternal sharp chest pain that had been intermittent x 2 days. On arrival she was afebrile and hemodynamically stable with BP 122/100, HR 77, and oxygen 100% on RA. She has a past medical history of CAD s/p DES x 2 to the RCA in November of 2018, HTN, HLD, GERD, hypothyroidism, aortic stenosis, and TIA. Chest pain was atypical and not similar in nature to her prior ischemic chest. It was reproducible on exam. Although she denied that the pain was similar to her GERD, she did endorse burping associated with the chest pain episodes. Her troponin remained < 0.03 x 3. EKGs were non ischemic. She was placed on  telemetry overnight without capture of arrhythmia or ST changes. Her chest pain resolved without intervention. Her PPI and aspirin and plavix were continued. Discharged with instructions to follow up in [facility] and with her cardiologist.   Discharge Vitals:   BP 115/78 (BP Location: Left Arm)   Pulse 79   Temp 97.8 F (36.6 C) (Oral)   Resp 17   Ht 5\' 7"  (1.702 m)   Wt 224 lb 11.2 oz (101.9 kg)   LMP 02/11/2011 Comment: no chance of pregnancy per pt  SpO2 96%   BMI 35.19 kg/m   Pertinent Labs, Studies, and Procedures:    Ref. Range 04/16/2018 14:14 04/17/2018 00:44 04/17/2018 01:30  Troponin I Latest Ref Range: <0.03 ng/mL   <0.03  Troponin i, poc Latest Ref Range: 0.00 - 0.08 ng/mL 0.00 0.00     Discharge Instructions: Discharge Instructions    Diet - low sodium heart healthy   Complete by:  As directed    Discharge instructions   Complete by:  As directed    Emma Bonilla,  It was a pleasure taking care of you. I am glad you are feeling better! I do not think your chest pain was coming from your heart. It is possible that you had inflammation of the muscles in your chest or chest pain related to your heart burn or indigestion. Please continue to take all of your medications as previously prescribed. Please follow up with your primary care doctor in the next 1-2 weeks and your cardiologist when able. If you have any questions or concerns, call our clinic at [phone] or after hours call [phone] and ask for the internal medicine resident on call. Thank you!  - Dr. Philipp Ovens   Increase activity slowly   Complete by:  As directed       Signed: Velna Ochs, MD 04/17/2018, 9:58 AM   Pager: [phone]

## 2018-04-17 NOTE — Progress Notes (Signed)
   Subjective: Doing well this morning, no further episodes of chest pain since last night.   Objective:  Vital signs in last 24 hours: Vitals:   04/16/18 2015 04/16/18 2226 04/17/18 0157 04/17/18 0606  BP: 116/82 (!) 122/100 (!) 143/80 115/78  Pulse: 75 77 72 79  Resp: 18 16 18 17   Temp:  98 F (36.7 C) 98.4 F (36.9 C) 97.8 F (36.6 C)  TempSrc:  Oral Oral Oral  SpO2: 100% 100% 100% 96%  Weight:   224 lb 11.2 oz (101.9 kg)   Height:   5\' 7"  (1.702 m)    Physical Exam Constitutional: NAD, appears comfortablee.  Cardiovascular: RRR, systolic murmur at RUSB Pulmonary/Chest: CTAB, no wheezes, rales, or rhonchi.   Extremities: Warm and well perfused.  No edema.  Psychiatric: Normal mood and affect  Assessment/Plan:  Atypical Chest pain Hx of CAD s/p DES in 10/2017 ACS ruled out with serial troponins and EKGs non ischemic. She does have a history of CAD with two DES placed to RCA in 10/2017. Her dual antiplatelet therapy was continued. She does report this chest pain was different in nature than her prior ischemic chest pain. Pain is likely costochondritis as it was reproducible on exam vs. GERD.  -- Now resolved -- Continue home ASA/plavix -- Continue home metoprolol, statin, and ezetimibe -- Follow up with cardiology outpatient   H/o HTN On outpatient regimen of losartan 100 mg, HCTZ 25 mg, metoprolol. Currently well controlled, continue home medications.   H/o GERD Currently prescribed protonix as an outpatient. She does report her pain is different than typical heart burn pain, but also reports burping.  --Continue home PPI    Hypokalemia Mildly decreased potassium. Replete PO  H/o Mild Aortic Stenosis Has been stable on past echo, catheterization. Monitoring as outpatient. Difficult to fully appreciate murmur on exam with habitus.   Dispo: Anticipated discharge today.   Velna Ochs, MD 04/17/2018, 9:33 AM Pager: [phone]

## 2018-04-17 NOTE — ED Provider Notes (Signed)
[facility] DEPARTMENT Provider Note   CSN: [id number] Arrival date & time: 04/16/18  1346     History   Chief Complaint Chief Complaint  Patient presents with  . Chest Pain    HPI Emma Bonilla is a 62 y.o. female.  The history is provided by the patient and a relative.  Chest Pain   This is a new problem. The current episode started 12 to 24 hours ago. The problem occurs constantly. The problem has been resolved. The pain is present in the substernal region. The pain is moderate. Quality: sharp and feels like indigestion. The pain does not radiate. Associated symptoms include diaphoresis, numbness and shortness of breath. She has tried rest for the symptoms. The treatment provided no relief.  Her past medical history is significant for CAD.   Patient with known history of CAD presents for chest pain.  She reports onset of sharp chest pain that progressed into indigestion.  She reports diaphoresis and shortness of breath with chest pain.  She also reports intermittent episodes of hand numbness No tearing or ripping sensation into her back.  She is chest pain-free at this time.  She denies any other acute complaints.  She reports it feels similar to prior episodes of chest pain Past Medical History:  Diagnosis Date  . Arthritis   . Depression   . Diastolic dysfunction without heart failure   . GERD (gastroesophageal reflux disease)   . Hepatic steatosis    a. seen on prior US.  Marland Kitchen Hyperlipidemia   . Hypertension   . Mild aortic regurgitation   . Mild aortic stenosis   . Premature atrial contractions   . PVC's (premature ventricular contractions)   . Thyroid disease   . TIA (transient ischemic attack)    2010  . Tobacco abuse     Patient Active Problem List   Diagnosis Date Noted  . Chest pain 04/17/2018  . Obesity 10/13/2017  . Shortness of breath   . CAD S/P percutaneous coronary angioplasty 10/12/2017  . Muscle spasm 07/20/2017  .  Numbness and tingling in left arm 07/20/2017  . Iron deficiency anemia 03/27/2017  . Lipoma 02/14/2017  . Hematochezia 02/13/2017  . Pre-operative cardiovascular examination 01/19/2017  . Bartholin gland cyst 11/21/2016  . TIA (transient ischemic attack) 08/21/2015  . Aortic stenosis due to bicuspid aortic valve 06/11/2015  . Dental caries 05/09/2013  . Seasonal allergies 04/03/2012  . Hypothyroidism 03/30/2012  . Osteoarthritis 11/07/2011  . Health care maintenance 09/01/2011  . Essential hypertension 07/25/2011  . Depression 07/25/2011  . GERD (gastroesophageal reflux disease) 07/25/2011  . Tobacco abuse 07/25/2011  . Hyperlipidemia 07/25/2011    Past Surgical History:  Procedure Laterality Date  . CORONARY STENT INTERVENTION N/A 10/13/2017   Procedure: CORONARY STENT INTERVENTION;  Surgeon: Leonie Man, MD;  Location: [facility];  Service: Cardiovascular;  Laterality: N/A;  . DILATION AND CURETTAGE OF UTERUS     30 years ago  . LEFT HEART CATH AND CORONARY ANGIOGRAPHY N/A 10/13/2017   Procedure: LEFT HEART CATH AND CORONARY ANGIOGRAPHY;  Surgeon: Leonie Man, MD;  Location: [facility];  Service: Cardiovascular;  Laterality: N/A;     OB History    Gravida  3   Para  3   Term  3   Preterm      AB      Living  3     SAB      TAB  Ectopic      Multiple      Live Births               Home Medications    Prior to Admission medications   Medication Sig Start Date End Date Taking? Authorizing Provider  amitriptyline (ELAVIL) 25 MG tablet Take 2 tablets (50 mg total) by mouth at bedtime. 11/17/17  Yes Santos-Sanchez, Merlene Morse, MD  aspirin 81 MG tablet Take 1 tablet (81 mg total) by mouth daily. 09/01/11  Yes Pedro Earls, MD  atorvastatin (LIPITOR) 40 MG tablet Take 1 tablet (40 mg total) by mouth daily at 6 PM. 11/17/17  Yes Santos-Sanchez, Merlene Morse, MD  calcium-vitamin D (OSCAL WITH D) 500-200 MG-UNIT per tablet Take 1 tablet  by mouth daily. Patient taking differently: Take 1 tablet by mouth 2 (two) times daily.  08/08/13  Yes Jones Bales, MD  cetirizine (ZYRTEC) 10 MG tablet Take 1 tablet (10 mg total) by mouth daily. 07/20/17  Yes Santos-Sanchez, Merlene Morse, MD  clopidogrel (PLAVIX) 75 MG tablet Take 1 tablet (75 mg total) by mouth daily with breakfast. 01/21/18  Yes Hilty, Nadean Corwin, MD  ferrous sulfate 325 (65 FE) MG tablet Take 1 tablet (325 mg total) by mouth daily. 03/31/18  Yes Welford Roche, MD  hydrochlorothiazide (MICROZIDE) 12.5 MG capsule Take 2 capsules (25 mg total) by mouth daily. 03/17/18  Yes Santos-Sanchez, Merlene Morse, MD  levothyroxine (SYNTHROID) 100 MCG tablet Take 1 tablet (100 mcg total) by mouth daily. 02/18/18 02/14/19 Yes Santos-Sanchez, Merlene Morse, MD  losartan (COZAAR) 100 MG tablet Take 1 tablet (100 mg total) by mouth daily. 03/17/18 03/17/19 Yes Santos-Sanchez, Merlene Morse, MD  Melatonin 3 MG TABS Take 3 mg by mouth at bedtime.   Yes [provider]  metoprolol succinate (TOPROL-XL) 25 MG 24 hr tablet Take 0.5 tablets (12.5 mg total) by mouth daily. 04/13/18  Yes Hilty, Nadean Corwin, MD  mometasone (NASONEX) 50 MCG/ACT nasal spray Place 2 sprays into the nose daily as needed. 03/03/18 02/27/19 Yes Santos-Sanchez, Merlene Morse, MD  pantoprazole (PROTONIX) 20 MG tablet Take 1 tablet (20 mg total) by mouth daily. Patient taking differently: Take 40 mg by mouth daily.  03/22/18 04/21/18 Yes Santos-Sanchez, Merlene Morse, MD  ezetimibe (ZETIA) 10 MG tablet Take 1 tablet (10 mg total) by mouth daily. 03/10/18 06/08/18  Hilty, Nadean Corwin, MD  hydrocortisone (ANUSOL-HC) 2.5 % rectal cream Place 1 application rectally 2 (two) times daily. Patient not taking: Reported on 04/17/2018 03/05/18   Levin Erp, PA  Olopatadine HCl 0.2 % SOLN Apply 1 drop to eye daily as needed. Patient not taking: Reported on 04/17/2018 02/18/18   Welford Roche, MD    Family History Family History  Problem Relation Age of Onset  .  Hypertension Mother   . Emphysema Mother   . Cancer Mother        lung  . Heart failure Mother        CHF  . Heart disease Father        MI at 25  . Hypertension Father   . Crohn's disease Father   . Heart disease Sister        LVAD (2014) - MI in her 48s  . Hypertension Sister   . Colon cancer Sister        86 Mets  . Kidney cancer Sister   . Skin cancer Maternal Grandmother   . Stroke Maternal Grandfather   . Diabetes Brother   . Valvular heart disease Sister  leaky valve  . Hypertension Sister   . Hyperlipidemia Sister   . Hypertension Sister   . Crohn's disease Sister   . Hypertension Child   . Hyperlipidemia Child   . Hyperlipidemia Child     Social History Social History   Tobacco Use  . Smoking status: Former Smoker    Packs/day: 0.50    Years: 45.00    Pack years: 22.50    Types: Cigarettes  . Smokeless tobacco: Never Used  Substance Use Topics  . Alcohol use: Yes    Comment: holidays and special occasion  . Drug use: Yes    Frequency: 1.0 times per week    Types: Marijuana    Comment: once weekly or once every 2 weeks     Allergies   Naproxen sodium   Review of Systems Review of Systems  Constitutional: Positive for diaphoresis.  Respiratory: Positive for shortness of breath.   Cardiovascular: Positive for chest pain.  Neurological: Positive for numbness.  All other systems reviewed and are negative.    Physical Exam Updated Vital Signs BP (!) 122/100 (BP Location: Right Arm)   Pulse 77   Temp 98 F (36.7 C) (Oral)   Resp 16   LMP 02/11/2011 Comment: no chance of pregnancy per pt  SpO2 100%   Physical Exam CONSTITUTIONAL: Well developed/well nourished HEAD: Normocephalic/atraumatic EYES: EOMI/PERRL ENMT: Mucous membranes moist NECK: supple no meningeal signs SPINE/BACK:entire spine nontender CV: I9/S8 noted, systolic murmur noted LUNGS: Lungs are clear to auscultation bilaterally, no apparent distress ABDOMEN: soft,  nontender, no rebound or guarding, bowel sounds noted throughout abdomen GU:no cva tenderness NEURO: Pt is awake/alert/appropriate, moves all extremitiesx4.  No facial droop. Equal handgrips noted EXTREMITIES: pulses normal/equalx4, full ROM, no calf tenderness or edema SKIN: warm, color normal PSYCH: no abnormalities of mood noted, alert and oriented to situation   ED Treatments / Results  Labs (all labs ordered are listed, but only abnormal results are displayed) Labs Reviewed  BASIC METABOLIC PANEL - Abnormal; Notable for the following components:      Result Value   Potassium 3.2 (*)    Glucose, Bld 129 (*)    Creatinine, Ser 1.12 (*)    GFR calc non Af Amer 52 (*)    All other components within normal limits  CBC  TROPONIN I  BASIC METABOLIC PANEL  MAGNESIUM  I-STAT TROPONIN, ED  I-STAT TROPONIN, ED    EKG EKG Interpretation  Date/Time:  Friday Apr 16 2018 13:51:23 EDT Ventricular Rate:  90 PR Interval:  176 QRS Duration: 82 QT Interval:  370 QTC Calculation: 452 R Axis:   3 Text Interpretation:  Normal sinus rhythm Low voltage QRS Cannot rule out Anterior infarct , age undetermined Abnormal ECG Confirmed by Ripley Fraise [phone]) on 04/17/2018 12:22:55 AM   Radiology Dg Chest 2 View  Result Date: 04/16/2018 CLINICAL DATA:  Chest pain for 2 days EXAM: CHEST - 2 VIEW COMPARISON:  10/12/2017 FINDINGS: The heart size and mediastinal contours are within normal limits. Both lungs are clear. The visualized skeletal structures are unremarkable. IMPRESSION: No active cardiopulmonary disease. Electronically Signed   By: Inez Catalina M.D.   On: 04/16/2018 15:48    Procedures Procedures (including critical care time)  Medications Ordered in ED Medications  nitroGLYCERIN (NITROSTAT) SL tablet 0.4 mg (has no administration in time range)  enoxaparin (LOVENOX) injection 40 mg (has no administration in time range)  sodium chloride flush (NS) 0.9 % injection 3 mL (has no  administration  in time range)  acetaminophen (TYLENOL) tablet 650 mg (has no administration in time range)    Or  acetaminophen (TYLENOL) suppository 650 mg (has no administration in time range)  potassium chloride 20 MEQ/15ML (10%) solution 40 mEq (has no administration in time range)  amitriptyline (ELAVIL) tablet 50 mg (has no administration in time range)  atorvastatin (LIPITOR) tablet 40 mg (has no administration in time range)  clopidogrel (PLAVIX) tablet 75 mg (has no administration in time range)  ezetimibe (ZETIA) tablet 10 mg (has no administration in time range)  ferrous sulfate tablet 325 mg (has no administration in time range)  levothyroxine (SYNTHROID, LEVOTHROID) tablet 100 mcg (has no administration in time range)  Melatonin TABS 3 mg (has no administration in time range)  metoprolol succinate (TOPROL-XL) 24 hr tablet 12.5 mg (has no administration in time range)  pantoprazole (PROTONIX) EC tablet 20 mg (has no administration in time range)  hydrochlorothiazide (MICROZIDE) capsule 25 mg (has no administration in time range)  losartan (COZAAR) tablet 100 mg (has no administration in time range)  aspirin EC tablet 81 mg (has no administration in time range)  aspirin chewable tablet 324 mg (324 mg Oral Given 04/17/18 0053)     Initial Impression / Assessment and Plan / ED Course  I have reviewed the triage vital signs and the nursing notes.  Pertinent labs & imaging results that were available during my care of the patient were reviewed by me and considered in my medical decision making (see chart for details).     Patient chest pain-free at this time.  Discussed with internal medicine service for admission. Low suspicion for PE/dissection at this time  Final Clinical Impressions(s) / ED Diagnoses   Final diagnoses:  Chest pain, rule out acute myocardial infarction    ED Discharge Orders    None       Ripley Fraise, MD 04/17/18 [phone]

## 2018-04-17 NOTE — H&P (Signed)
Date: 04/17/2018               Patient Name:  Emma Bonilla MRN: [id number]  DOB: 10/31/1956 Age / Sex: 62 y.o., female   PCP: Welford Roche, MD         Medical Service: Internal Medicine Teaching Service         Attending Physician: Dr. Ripley Fraise, MD    First Contact: Dr. Maricela Bo Pager: [phone]  Second Contact: Dr. Philipp Ovens Pager: [phone]       After Hours (After 5p/  First Contact Pager: [phone]  weekends / holidays): Second Contact Pager: [phone]   Chief Complaint: Chest Pain   History of Present Illness: Emma Bonilla is a 62 yo F with a past medical history of CAD (s/p DES x 2 to RCA in 10/2017), HLD, HTN, GERD, hypothyroidism, HLD, aortic stenosis,TIA who presented to the ED with chest pain.  Reports onset of central, sharp chest pain on 5/16 while at rest. Has occurred intermittently since onset (also at rest), last episode about 30 min prior to evaluation. Has also felt pain to R of midline, no radiation to left or down arm. No modifying factors noted including movement or rest, position, changes with food. She did take Tylenol last night with relief. She reports associated shortness of breath and diaphoresis at times. States she is adherent to dual anti-plt therapy, though did miss dose this am d/t pain and leaving for ED. Denies nausea, vomiting, light-headedness, cough, fever, recent illness. She does also note burping during some episodes of the pain, as well. She has a history of GERD but feels this pain is different than her typical heartburn. Denies recent heavy lifting or exertion.   In the ED, T 98, HR 77, BP 122/100, 100% on RA. Labs notable for K 3.2, Cr 1.12 (b/l ~1), initial troponin negative.  She received aspirin 324 mg and nitroglycerin and was admitted for further management.   Meds:  No outpatient medications have been marked as taking for the 04/16/18 encounter [facility]).     Allergies: Allergies as of 04/16/2018 - Review  Complete 04/16/2018  Allergen Reaction Noted  . Naproxen sodium Rash 07/25/2011   Past Medical History:  Diagnosis Date  . Arthritis   . Depression   . Diastolic dysfunction without heart failure   . GERD (gastroesophageal reflux disease)   . Hepatic steatosis    a. seen on prior US.  Marland Kitchen Hyperlipidemia   . Hypertension   . Mild aortic regurgitation   . Mild aortic stenosis   . Premature atrial contractions   . PVC's (premature ventricular contractions)   . Thyroid disease   . TIA (transient ischemic attack)    2010  . Tobacco abuse     Family History:  Family History  Problem Relation Age of Onset  . Hypertension Mother   . Emphysema Mother   . Cancer Mother        lung  . Heart failure Mother        CHF  . Heart disease Father        MI at 42  . Hypertension Father   . Crohn's disease Father   . Heart disease Sister        LVAD (2014) - MI in her 82s  . Hypertension Sister   . Colon cancer Sister        62 Mets  . Kidney cancer Sister   . Skin cancer Maternal Grandmother   .  Stroke Maternal Grandfather   . Diabetes Brother   . Valvular heart disease Sister        leaky valve  . Hypertension Sister   . Hyperlipidemia Sister   . Hypertension Sister   . Crohn's disease Sister   . Hypertension Child   . Hyperlipidemia Child   . Hyperlipidemia Child      Social History:  Social History   Tobacco Use  . Smoking status: Current Every Day Smoker    Packs/day: 0.50    Years: 45.00    Pack years: 22.50    Types: Cigarettes  . Smokeless tobacco: Never Used  . Tobacco comment: 1/2 pack or less  Substance Use Topics  . Alcohol use: Yes    Comment: holidays and special occasion  . Drug use: Yes    Frequency: 1.0 times per week    Types: Marijuana    Comment: once weekly or once every 2 weeks     Review of Systems: A complete ROS was negative except as per HPI.   Physical Exam: Blood pressure (!) 122/100, pulse 77, temperature 98 F (36.7 C),  temperature source Oral, resp. rate 16, last menstrual period 02/11/2011, SpO2 100 %. General: Resting in bed comfortably, no acute distress Head: Normocephalic, atraumatic Eyes: EOMI, PERRL, arcus senilis  ENT: Moist mucus membranes, no pharyngeal exudate CV: RRR, distant heart sounds likely d/t habitus. No tenderness to palpation of chest wall   Resp: Clear breath sounds bilaterally, normal work of breathing, no distress  Abd: Soft, +BS, obese, non-tender to palpation  Extr: No LE edema, palpable peripheral pulses  Neuro: Alert and oriented x3, moving all extremities spontaneously  Skin: Warm, dry    EKG: personally reviewed my interpretation is sinus rhythm, appropriate intervals, some wander, no acute ischemic changes appreciated. Overall similar to prior, aVF w/o Q waves, low amplitude.   CXR: personally reviewed my interpretation is no effusions, no focal consolidation, no cardiomegaly.   Assessment & Plan by Problem:  Chest Pain, H/o CAD  Presenting with several day history of chest pain fairly atypical in description. She does have a history of CAD with two DES placed to RCA 10/2017, currently on dual anti-platelet therapy--reports adherence despite missed dose this am. Cath at that time did not demonstrate significant disease elsewhere. Troponin negative x2, though were also negative at the time of stent placement. This may be GERD related vs cardiac (though unlikely to be ACS at this point), or other causes felt to be less likely at this point (costochondritis, pericarditis, vte).  --Monitor vital signs, tele --Cont ASA/Plavix --Cont home metoprolol, statin and ezetimibe --Rpt EKG, ntg with recurrent chest pain  --Rpt Troponin x 1 --BMP, Mg     H/o HTN On outpatient regimen of losartan 100 mg, HCTZ 25 mg, metoprolol. Currently well controlled, continue home medications.   H/o GERD Currently prescribed protonix as an outpatient. She does report her pain is different than  typical heart burn pain, but also reports burping.  --Continue home PPI    Hypokalemia Mildly decreased potassium. Replete PO  H/o Mild Aortic Stenosis Has been stable on past echo, catheterization. Monitoring as outpatient. Difficult to fully appreciate murmur on exam with habitus.   Dispo: Admit patient to Observation with expected length of stay less than 2 midnights.  Signed: Tawny Asal, MD 04/17/2018, 12:41 AM  Pager: [phone]

## 2018-04-17 NOTE — Progress Notes (Signed)
Pt got discharged to home, discharge instructions provided and patient showed understanding to it, IV taken out,Telemonitor DC,pt left unit in wheelchair with all of the belongings accompanied with a family member (Daughter)  Cincere Zorn, RN 

## 2018-04-20 ENCOUNTER — Ambulatory Visit (INDEPENDENT_AMBULATORY_CARE_PROVIDER_SITE_OTHER): Payer: Self-pay | Admitting: Gastroenterology

## 2018-04-20 VITALS — BP 122/82 | HR 76 | Ht 67.0 in | Wt 223.0 lb

## 2018-04-20 DIAGNOSIS — K648 Other hemorrhoids: Secondary | ICD-10-CM

## 2018-04-20 DIAGNOSIS — Z7902 Long term (current) use of antithrombotics/antiplatelets: Secondary | ICD-10-CM

## 2018-04-20 DIAGNOSIS — K5900 Constipation, unspecified: Secondary | ICD-10-CM

## 2018-04-20 NOTE — Patient Instructions (Signed)
No AVS

## 2018-04-20 NOTE — Progress Notes (Signed)
HPI :  62 year old female with a history of coronary artery disease status post coronary stenting in November 2018 on aspirin and Plavix, here for follow-up visit regarding hemorrhoids.  She had a colonoscopy with me in February at which point in time she had a few adenomatous polyps removed. Recall colonoscopy recommended for February 2021. She was also noted to have internal hemorrhoids are the cause for rectal bleeding. She had therapy for her internal hemorrhoids with rubber band ligation from 04-08-2023 to August 2018. She states that this worked really well to control her bleeding symptoms at the time and she had not had any further bleeding symptoms until March 2019.  She had seen our office at that time with some periodic bright red blood in her stool. She states her sister was sick with colon cancer and entered hospice. She was very stressed at this time and endorsed passing hard stools. It was during this time when she had rectal bleeding. We had recommended using hydrocortisone cream within the anal canal for initial therapy given she is not a good candidate for banding in light of her Plavix use. She states she failed to try this. She states her sister passed away in 04/08/23, and after this happened she states her stress level reduced in her bowels returned to normal form and were not as hard to pass. She has not had any bleeding symptoms since the beginning of 08-Apr-2023. Generally she is feeling well without any complaints at this time. She continues to take aspirin and Plavix, she is also on Protonix on a daily basis. She has been using MiraLAX as needed for constipation.  Most recently she was admitted overnight at the hospital on May 18 for chest pain. She ruled out for recurrent MI. She has not any recurrent chest pain since that time.  Colonoscopy 01/26/17 - for personal history of adenomatous colon polyps and family history of colon cancer in her sister. Nonthrombosed external hemorrhoids, 4 mm  polyp in the cecum, 4 mm polyp in the transverse colon, diminutive polyp at splenic flexure, 3 3-5 mm polyps in the rectum and internal hemorrhoids. Most polyps adenomas.   Past Medical History:  Diagnosis Date  . Arthritis   . Depression   . Diastolic dysfunction without heart failure   . GERD (gastroesophageal reflux disease)   . Hepatic steatosis    a. seen on prior US.  Marland Kitchen Hyperlipidemia   . Hypertension   . Mild aortic regurgitation   . Mild aortic stenosis   . Premature atrial contractions   . PVC's (premature ventricular contractions)   . Thyroid disease   . TIA (transient ischemic attack)    2010  . Tobacco abuse      Past Surgical History:  Procedure Laterality Date  . CORONARY STENT INTERVENTION N/A 10/13/2017   Procedure: CORONARY STENT INTERVENTION;  Surgeon: Leonie Man, MD;  Location: [facility];  Service: Cardiovascular;  Laterality: N/A;  . DILATION AND CURETTAGE OF UTERUS     30 years ago  . LEFT HEART CATH AND CORONARY ANGIOGRAPHY N/A 10/13/2017   Procedure: LEFT HEART CATH AND CORONARY ANGIOGRAPHY;  Surgeon: Leonie Man, MD;  Location: [facility];  Service: Cardiovascular;  Laterality: N/A;   Family History  Problem Relation Age of Onset  . Hypertension Mother   . Emphysema Mother   . Cancer Mother        lung  . Heart failure Mother  CHF  . Heart disease Father        MI at 66  . Hypertension Father   . Crohn's disease Father   . Heart disease Sister        LVAD (2014) - MI in her 53s  . Hypertension Sister   . Colon cancer Sister        67 Mets  . Kidney cancer Sister   . Skin cancer Maternal Grandmother   . Stroke Maternal Grandfather   . Diabetes Brother   . Valvular heart disease Sister        leaky valve  . Hypertension Sister   . Hyperlipidemia Sister   . Hypertension Sister   . Crohn's disease Sister   . Hypertension Child   . Hyperlipidemia Child   . Hyperlipidemia Child    Social History    Tobacco Use  . Smoking status: Former Smoker    Packs/day: 0.50    Years: 45.00    Pack years: 22.50    Types: Cigarettes  . Smokeless tobacco: Never Used  Substance Use Topics  . Alcohol use: Yes    Comment: holidays and special occasion  . Drug use: Yes    Frequency: 1.0 times per week    Types: Marijuana    Comment: once weekly or once every 2 weeks   Current Outpatient Medications  Medication Sig Dispense Refill  . amitriptyline (ELAVIL) 25 MG tablet Take 2 tablets (50 mg total) by mouth at bedtime. 90 tablet 3  . aspirin 81 MG tablet Take 1 tablet (81 mg total) by mouth daily. 30 tablet 3  . atorvastatin (LIPITOR) 40 MG tablet Take 1 tablet (40 mg total) by mouth daily at 6 PM. 90 tablet 3  . calcium-vitamin D (OSCAL WITH D) 500-200 MG-UNIT per tablet Take 1 tablet by mouth daily. (Patient taking differently: Take 1 tablet by mouth 2 (two) times daily. ) 30 tablet 3  . cetirizine (ZYRTEC) 10 MG tablet Take 1 tablet (10 mg total) by mouth daily. 60 tablet 3  . clopidogrel (PLAVIX) 75 MG tablet Take 1 tablet (75 mg total) by mouth daily with breakfast. 30 tablet 6  . ezetimibe (ZETIA) 10 MG tablet Take 1 tablet (10 mg total) by mouth daily. 90 tablet 3  . ferrous sulfate 325 (65 FE) MG tablet Take 1 tablet (325 mg total) by mouth daily. 30 tablet 3  . hydrochlorothiazide (MICROZIDE) 12.5 MG capsule Take 2 capsules (25 mg total) by mouth daily. 60 capsule 3  . hydrocortisone (ANUSOL-HC) 2.5 % rectal cream Place 1 application rectally 2 (two) times daily. (Patient not taking: Reported on 04/17/2018) 30 g 1  . levothyroxine (SYNTHROID) 100 MCG tablet Take 1 tablet (100 mcg total) by mouth daily. 90 tablet 1  . losartan (COZAAR) 100 MG tablet Take 1 tablet (100 mg total) by mouth daily. 30 tablet 3  . Melatonin 3 MG TABS Take 3 mg by mouth at bedtime.    . metoprolol succinate (TOPROL-XL) 25 MG 24 hr tablet Take 0.5 tablets (12.5 mg total) by mouth daily. 15 tablet 7  . mometasone  (NASONEX) 50 MCG/ACT nasal spray Place 2 sprays into the nose daily as needed. 17 g 2  . Olopatadine HCl 0.2 % SOLN Apply 1 drop to eye daily as needed. (Patient not taking: Reported on 04/17/2018) 3 Bottle 3  . pantoprazole (PROTONIX) 20 MG tablet Take 1 tablet (20 mg total) by mouth daily. (Patient taking differently: Take 40 mg by mouth daily. )  30 tablet 2   No current facility-administered medications for this visit.    Allergies  Allergen Reactions  . Naproxen Sodium Rash     Review of Systems: All systems reviewed and negative except where noted in HPI.   Lab Results  Component Value Date   WBC 6.4 04/16/2018   HGB 14.8 04/16/2018   HCT 45.2 04/16/2018   MCV 90.8 04/16/2018   PLT 329 04/16/2018    Lab Results  Component Value Date   CREATININE 0.88 04/17/2018   BUN 9 04/17/2018   NA 141 04/17/2018   K 3.5 04/17/2018   CL 103 04/17/2018   CO2 29 04/17/2018    Lab Results  Component Value Date   ALT 18 03/05/2018   AST 16 03/05/2018   ALKPHOS 95 03/05/2018   BILITOT 0.3 03/05/2018     Physical Exam: BP 122/82   Pulse 76   Ht 5\' 7"  (1.702 m)   Wt 223 lb (101.2 kg)   LMP 02/11/2011 Comment: no chance of pregnancy per pt  SpO2 96%   BMI 34.93 kg/m  Constitutional: Pleasant,well-developed, female in no acute distress. HEENT: Normocephalic and atraumatic. Conjunctivae are normal. No scleral icterus. Neck supple.  Cardiovascular: Normal rate, regular rhythm.  Pulmonary/chest: Effort normal and breath sounds normal. No wheezing, rales or rhonchi. Abdominal: Soft, nondistended, nontender.  There are no masses palpable. No hepatomegaly. Extremities: no edema Lymphadenopathy: No cervical adenopathy noted. Neurological: Alert and oriented to person place and time. Skin: Skin is warm and dry. No rashes noted. Psychiatric: Normal mood and affect. Behavior is normal.   ASSESSMENT AND PLAN: 62 year old female with history of coronary artery disease status post  coronary stenting in November 2018 on aspirin and Plavix, with a history of internal hemorrhoids and associated rectal bleeding, here for follow-up visit.  She had a very nice response to hemorrhoid band ligation in 2018, she had no symptoms for several months. I suspect she had recurrent rectal bleeding from internal hemorrhoids in the setting of Plavix use this spring when she had worsening constipation. Now that her life stressors or less and she is recovering from the death of her sister, she states her bowels are normal without any constipation and her bleeding has stopped for upwards of 6 weeks at this point. Her Hgb is normal. She never took the hydrocortisone as previously recommended. If she does have recurrent bleeding or recommend she use hydrocortisone cream given she is not a good candidate for hemorrhoid banding while she is taking Plavix. Given her coronary stenting I'm assuming she will be on uninterrupted Plavix through next November. She would not be a good candidate for banding until Plavix is able to be held for a extended period time.   Otherwise a reassured her of her recent colonoscopy last year, there is no concerning mass lesion or high-risk polyp noted. She is due for surveillance colonoscopy 3 years from her last exam for her history of polyps. I recommend moving forward she use MiraLAX as needed to keep stool soft. If she has recurrent bleeding use hydrocortisone as needed. She can follow-up with me as needed if her symptoms recur in the interim despite these measures. She agreed with the plan.   [place], MD [facility]

## 2018-04-30 ENCOUNTER — Ambulatory Visit: Payer: Self-pay

## 2018-05-03 ENCOUNTER — Ambulatory Visit (INDEPENDENT_AMBULATORY_CARE_PROVIDER_SITE_OTHER): Payer: No Typology Code available for payment source | Admitting: Internal Medicine

## 2018-05-03 ENCOUNTER — Encounter: Payer: Self-pay | Admitting: Internal Medicine

## 2018-05-03 VITALS — BP 112/62 | HR 92 | Ht 67.0 in | Wt 228.8 lb

## 2018-05-03 DIAGNOSIS — K219 Gastro-esophageal reflux disease without esophagitis: Secondary | ICD-10-CM

## 2018-05-03 DIAGNOSIS — I10 Essential (primary) hypertension: Secondary | ICD-10-CM

## 2018-05-03 DIAGNOSIS — Z955 Presence of coronary angioplasty implant and graft: Secondary | ICD-10-CM

## 2018-05-03 DIAGNOSIS — I350 Nonrheumatic aortic (valve) stenosis: Secondary | ICD-10-CM

## 2018-05-03 MED ORDER — PANTOPRAZOLE SODIUM 40 MG PO TBEC: 40.0000 mg | DELAYED_RELEASE_TABLET | Freq: Every day | ORAL | 3 refills | Status: DC

## 2018-05-03 NOTE — Progress Notes (Signed)
OFFICE NOTE  Chief Complaint:  Follow-up hospitalization  Primary Care Physician: Welford Roche, MD  HPI:  Emma Bonilla is a pleasant 62 year old female is currently referred to me from the internal medicine clinic for evaluation of palpitations. Emma Bonilla has a strong family history of heart disease with both her mother who had congestive heart failure in her father who had died of an MI. She has a personal history of hypertension, dyslipidemia, about a 40-50 pack year smoking history and a known murmur. She presents for evaluation of palpitations. She reports this is been going on for about 2 months and mostly takes place at rest. She notices at night. There are episodes were heart races for several seconds and then goes back to normal. She said she's had 2 or 3 of these episodes in the past month. She does report a prior history of TIA in 2010 and started taking aspirin after that. She has good control of her cholesterol on Crestor. She denies any chest pain or worsening shortness of breath. She's never had presyncope or syncopal symptoms.  Emma Bonilla returns today for follow-up of echo and monitor. The echocardiogram shows an EF of 53-61% with diastolic dysfunction. Additionally there was mild aortic stenosis with a functionally bicuspid valve which is moderately calcified. There was moderate regurgitation. The mean gradient was 13 mmHg. She also had a monitor which demonstrated PACs and PVCs but otherwise sinus rhythm. No A. fib or ventricular arrhythmias were noted. She reports her palpitations have picked up some since she wore the monitor however suspect it's more of the same. She denies any cardiac sounding chest pain or worsening shortness of breath with exertion although I have a high retest probability of coronary disease.  I saw Ms. Haseman back today in follow-up. She has since been started on low-dose beta blocker and is tolerating it well. She says this is  significantly help with her palpitations and she has no more symptoms.  06/16/2016  Emma Bonilla was seen back today in follow-up. She denies any worsening palpitations. EKG shows normal sinus rhythm. Unfortunate she's had close to 10 pound weight gain. She attributed this to stopping smoking, which I commended her on. I have however encourage her to work on some more exercise.  01/19/2017  Emma Bonilla was seen today in follow-up. She recently got the news that her sister has stage IV colon cancer. Based on these findings she is concerned that she may also have colon cancer and has scheduled an upcoming colonoscopy on February 26. Given her establish cardiac history, preoperative risk assessment was recommended. She last had an echo more than a year ago which showed mild aortic stenosis and a functionally bicuspid aortic valve with moderate aortic insufficiency. LVEF is 65-70%. She also had hypertension which was not adequately controlled however blood pressure today is improved at 134/78. She denies any chest pain or worsening shortness of breath.  11/19/2017  Emma Bonilla returns today for follow-up of her hospitalization.  She recently was admitted with unstable angina and underwent left heart catheterization by Dr. Ellyn Hack on 10/13/2017.  This demonstrated percent stenosis to the mid RCA and 70% proximal RCA stenosis.  She underwent extensive overlapping 3.0 and 3.25 x 38 mm drug-eluting stents.  LVEF was preserved at 60%.  It was noted she has mild aortic valve stenosis with a peak gradient between 25 and 30 mmHg.  Since discharge she has had marked improvement in her symptoms.  She denies any significant shortness  of breath.  She occasionally gets chest discomfort which is improved with Tylenol and worse with lifting that sounds musculoskeletal.  She is scheduled to start cardiac rehab on January 14.  She has had no side effects of high-dose atorvastatin, which was started in the hospital.  In  addition I increased her losartan and blood pressure is at goal today 132/89.  The good news is she reported she stopped smoking about 4 days ago.  She says she is going to be committed to this.  She is concerned because her sister has a LVAD.  05/03/2018  Emma Bonilla returns today for follow-up.  She was recently hospitalized again with chest pain.  She ruled out for MI.  It was felt that her symptoms were related to reflux.  She had worsening belching and reported she ran out of her pantoprazole.  This was reordered however it only 20 mg.  She was previously taking 40 mg.  She denies any worsening symptoms.  Is been more than a year since her last echo and she was noted to have at least mild aortic stenosis.  Her murmur is difficult to auscultate.  PMHx:  Past Medical History:  Diagnosis Date  . Arthritis   . Depression   . Diastolic dysfunction without heart failure   . GERD (gastroesophageal reflux disease)   . Hepatic steatosis    a. seen on prior US.  Marland Kitchen Hyperlipidemia   . Hypertension   . Mild aortic regurgitation   . Mild aortic stenosis   . Premature atrial contractions   . PVC's (premature ventricular contractions)   . Thyroid disease   . TIA (transient ischemic attack)    2010  . Tobacco abuse     Past Surgical History:  Procedure Laterality Date  . CORONARY STENT INTERVENTION N/A 10/13/2017   Procedure: CORONARY STENT INTERVENTION;  Surgeon: Leonie Man, MD;  Location: [facility];  Service: Cardiovascular;  Laterality: N/A;  . DILATION AND CURETTAGE OF UTERUS     30 years ago  . LEFT HEART CATH AND CORONARY ANGIOGRAPHY N/A 10/13/2017   Procedure: LEFT HEART CATH AND CORONARY ANGIOGRAPHY;  Surgeon: Leonie Man, MD;  Location: [facility];  Service: Cardiovascular;  Laterality: N/A;    FAMHx:  Family History  Problem Relation Age of Onset  . Hypertension Mother   . Emphysema Mother   . Cancer Mother        lung  . Heart failure Mother         CHF  . Heart disease Father        MI at 76  . Hypertension Father   . Crohn's disease Father   . Heart disease Sister        LVAD (2014) - MI in her 52s  . Hypertension Sister   . Colon cancer Sister        70 Mets  . Kidney cancer Sister   . Skin cancer Maternal Grandmother   . Stroke Maternal Grandfather   . Diabetes Brother   . Valvular heart disease Sister        leaky valve  . Hypertension Sister   . Hyperlipidemia Sister   . Hypertension Sister   . Crohn's disease Sister   . Hypertension Child   . Hyperlipidemia Child   . Hyperlipidemia Child     SOCHx:   reports that she has quit smoking. Her smoking use included cigarettes. She has a 22.50 pack-year smoking history. She has  never used smokeless tobacco. She reports that she drinks alcohol. She reports that she has current or past drug history. Drug: Marijuana. Frequency: 1.00 time per week.  ALLERGIES:  Allergies  Allergen Reactions  . Naproxen Sodium Rash    ROS: Pertinent items noted in HPI and remainder of comprehensive ROS otherwise negative.  HOME MEDS: Current Outpatient Medications  Medication Sig Dispense Refill  . amitriptyline (ELAVIL) 25 MG tablet Take 2 tablets (50 mg total) by mouth at bedtime. 90 tablet 3  . aspirin 81 MG tablet Take 1 tablet (81 mg total) by mouth daily. 30 tablet 3  . atorvastatin (LIPITOR) 40 MG tablet Take 1 tablet (40 mg total) by mouth daily at 6 PM. 90 tablet 3  . calcium-vitamin D (OSCAL WITH D) 500-200 MG-UNIT per tablet Take 1 tablet by mouth daily. (Patient taking differently: Take 1 tablet by mouth 2 (two) times daily. ) 30 tablet 3  . cetirizine (ZYRTEC) 10 MG tablet Take 1 tablet (10 mg total) by mouth daily. 60 tablet 3  . clopidogrel (PLAVIX) 75 MG tablet Take 1 tablet (75 mg total) by mouth daily with breakfast. 30 tablet 6  . ezetimibe (ZETIA) 10 MG tablet Take 1 tablet (10 mg total) by mouth daily. 90 tablet 3  . ferrous sulfate 325 (65 FE) MG tablet Take 1  tablet (325 mg total) by mouth daily. 30 tablet 3  . hydrochlorothiazide (MICROZIDE) 12.5 MG capsule Take 2 capsules (25 mg total) by mouth daily. 60 capsule 3  . hydrocortisone (ANUSOL-HC) 2.5 % rectal cream Place 1 application rectally 2 (two) times daily. 30 g 1  . levothyroxine (SYNTHROID) 100 MCG tablet Take 1 tablet (100 mcg total) by mouth daily. 90 tablet 1  . losartan (COZAAR) 100 MG tablet Take 1 tablet (100 mg total) by mouth daily. 30 tablet 3  . Melatonin 3 MG TABS Take 3 mg by mouth at bedtime.    . metoprolol succinate (TOPROL-XL) 25 MG 24 hr tablet Take 0.5 tablets (12.5 mg total) by mouth daily. 15 tablet 7  . mometasone (NASONEX) 50 MCG/ACT nasal spray Place 2 sprays into the nose daily as needed. 17 g 2  . Olopatadine HCl 0.2 % SOLN Apply 1 drop to eye daily as needed. 3 Bottle 3  . pantoprazole (PROTONIX) 20 MG tablet Take 1 tablet (20 mg total) by mouth daily. (Patient taking differently: Take 40 mg by mouth daily. ) 30 tablet 2   No current facility-administered medications for this visit.     LABS/IMAGING: No results found for this or any previous visit (from the past 48 hour(s)). No results found.  WEIGHTS: Wt Readings from Last 3 Encounters:  05/03/18 228 lb 12.8 oz (103.8 kg)  04/20/18 223 lb (101.2 kg)  04/17/18 224 lb 11.2 oz (101.9 kg)    VITALS: BP 112/62   Pulse 92   Ht 5\' 7"  (1.702 m)   Wt 228 lb 12.8 oz (103.8 kg)   LMP 02/11/2011 Comment: no chance of pregnancy per pt  BMI 35.84 kg/m   EXAM: General appearance: alert and no distress Neck: no carotid bruit, no JVD and thyroid not enlarged, symmetric, no tenderness/mass/nodules Lungs: clear to auscultation bilaterally Heart: regular rate and rhythm, systolic murmur: early systolic 2/6, crescendo at 2nd right intercostal space and diastolic murmur: mid diastolic 2/6, blowing at lower left sternal border Abdomen: soft, non-tender; bowel sounds normal; no masses,  no organomegaly and  obese Extremities: extremities normal, atraumatic, no cyanosis or edema Pulses:  2+ and symmetric Skin: Skin color, texture, turgor normal. No rashes or lesions Neurologic: Grossly normal Psych: Pleasant  EKG: Deferred  ASSESSMENT: 1. Unstable angina - s/p PCI to the RCA with overlapping DES (10/2017) 2. Palpitations - PACs and PVCs - resolved with beta blocker 3. Mild aortic stenosis with a functionally bicuspid valve, moderate aortic insufficiency, EF 65-70% 4. Hypertension 5. Dyslipidemia 6. GERD  PLAN: 1.   Emma Bonilla was recently in the hospital for chest pain ruled out for MI.  Is felt that this was likely related to GERD.  She felt better on 40 mg of Protonix versus 20 mg.  I will go ahead and order to increase that today.  I doubt her chest pain was cardiac however is been more than a year since her last echo.  I would like to repeat her study to see if there is been any worsening of her aortic stenosis or insufficiency.  Her valve is difficult to auscultate.  Plan follow-up with me annually or sooner as necessary.  Pixie Casino, MD, [facility], [place] Director of the [facility]  Card[facility] Attending Cardiologist  Direct Dial: [phone]  Fax: [phone]  Website:  [email or web address] 05/03/2018, 10:24 AM

## 2018-05-03 NOTE — Patient Instructions (Signed)
Your physician has requested that you have an echocardiogram @ [address] - [address]. Echocardiography is a painless test that uses sound waves to create images of your heart. It provides your doctor with information about the size and shape of your heart and how well your heart's chambers and valves are working. This procedure takes approximately one hour. There are no restrictions for this procedure.  Your physician wants you to follow-up in ONE YEAR with Dr. Hilty.  You will receive a reminder letter in the mail two months in advance. If you don't receive a letter, please call our office to schedule the follow-up appointment.   

## 2018-05-04 ENCOUNTER — Ambulatory Visit (INDEPENDENT_AMBULATORY_CARE_PROVIDER_SITE_OTHER): Payer: No Typology Code available for payment source | Admitting: Internal Medicine

## 2018-05-04 ENCOUNTER — Other Ambulatory Visit: Payer: Self-pay

## 2018-05-04 VITALS — BP 120/67 | HR 83 | Temp 98.2°F | Ht 67.0 in | Wt 228.4 lb

## 2018-05-04 DIAGNOSIS — F329 Major depressive disorder, single episode, unspecified: Secondary | ICD-10-CM

## 2018-05-04 DIAGNOSIS — R079 Chest pain, unspecified: Secondary | ICD-10-CM

## 2018-05-04 DIAGNOSIS — F339 Major depressive disorder, recurrent, unspecified: Secondary | ICD-10-CM

## 2018-05-04 DIAGNOSIS — F32A Depression, unspecified: Secondary | ICD-10-CM

## 2018-05-04 DIAGNOSIS — K219 Gastro-esophageal reflux disease without esophagitis: Secondary | ICD-10-CM

## 2018-05-04 DIAGNOSIS — Z955 Presence of coronary angioplasty implant and graft: Secondary | ICD-10-CM

## 2018-05-04 DIAGNOSIS — E785 Hyperlipidemia, unspecified: Secondary | ICD-10-CM

## 2018-05-04 DIAGNOSIS — Z79899 Other long term (current) drug therapy: Secondary | ICD-10-CM

## 2018-05-04 DIAGNOSIS — I10 Essential (primary) hypertension: Secondary | ICD-10-CM

## 2018-05-04 DIAGNOSIS — I350 Nonrheumatic aortic (valve) stenosis: Secondary | ICD-10-CM

## 2018-05-04 MED ORDER — CETIRIZINE HCL 10 MG PO TABS: 10.0000 mg | ORAL_TABLET | Freq: Every day | ORAL | 3 refills | Status: DC

## 2018-05-04 MED ORDER — BUPROPION HCL ER (XL) 150 MG PO TB24
ORAL_TABLET | ORAL | 0 refills | Status: DC
Start: 2018-05-04 — End: 2018-05-05

## 2018-05-04 MED ORDER — AMITRIPTYLINE HCL 25 MG PO TABS: 50.0000 mg | ORAL_TABLET | Freq: Every day | ORAL | 3 refills | Status: DC

## 2018-05-04 NOTE — Patient Instructions (Signed)
Emma Bonilla,  I am glad you are feeling better.  As we discussed if you have any changes in the chest pain or any other symptoms to be discussed please give [place] a call.  I am going to start you on Wellbutrin take 150 mg daily for a week and then 300 mg (2 pills) daily thereafter.  Please follow-up with your regular doctor in 6 to 8 weeks for recheck.

## 2018-05-04 NOTE — Progress Notes (Signed)
   CC: Hospital follow up for chest pain  HPI:  [name] is a 62 y.o. female with a past medical history listed below here today for follow up of her recent hospitalization for chest pain.   For details of today's visit and the status of her chronic medical issues please refer to the assessment and plan.   Past Medical History:  Diagnosis Date  . Arthritis   . Depression   . Diastolic dysfunction without heart failure   . GERD (gastroesophageal reflux disease)   . Hepatic steatosis    a. seen on prior US.  Marland Kitchen Hyperlipidemia   . Hypertension   . Mild aortic regurgitation   . Mild aortic stenosis   . Premature atrial contractions   . PVC's (premature ventricular contractions)   . Thyroid disease   . TIA (transient ischemic attack)    2010  . Tobacco abuse    Review of Systems:   No shortness of breath, nausea, diaphoresis, vision changes +occasional non-exertional chest pain  Physical Exam:  Vitals:   05/04/18 1022  BP: 120/67  Pulse: 83  Temp: 98.2 F (36.8 C)  TempSrc: Oral  SpO2: 100%  Weight: 228 lb 6.4 oz (103.6 kg)  Height: 5\' 7"  (1.702 m)   GENERAL- alert, co-operative, appears as stated age, not in any distress. HEENT- Atraumatic, normocephalic CARDIAC- RRR, no murmurs, rubs or gallops. RESP- clear to auscultation bilaterally, no wheezes or crackles. ABDOMEN- Soft, nontender, bowel sounds present. EXTREMITIES- pulse 2+, symmetric, no pedal edema. SKIN- Warm, dry, No rash or lesion. PSYCH- Depressed mood and affect, appropriate thought content and speech.   Assessment & Plan:   See Encounters Tab for problem based charting.  Patient discussed with Dr. Rebeca Alert

## 2018-05-05 ENCOUNTER — Telehealth: Payer: Self-pay | Admitting: *Deleted

## 2018-05-05 ENCOUNTER — Other Ambulatory Visit: Payer: Self-pay | Admitting: *Deleted

## 2018-05-05 MED ORDER — BUPROPION HCL ER (SR) 150 MG PO TB12
ORAL_TABLET | ORAL | 0 refills | Status: DC
Start: 2018-05-05 — End: 2018-06-18

## 2018-05-05 MED ORDER — MOMETASONE FUROATE 50 MCG/ACT NA SUSP: 2.0000 | Freq: Every day | NASAL | 2 refills | Status: DC | PRN

## 2018-05-05 NOTE — Telephone Encounter (Signed)
[facility]  Advanced Heart Failure and Transplant  Heart Transplant Clinic  Follow-up Visit    Primary Care Physician: Brodsky, Mark E  Referring Provider: Brett Justin Berman  Date of Transplant: 01/20/2020  Organ(s) Transplanted: heart  Indication for transplant: Dilated Myopathy: Idiopathic  PHS increased risk donor: Yes    ID. 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    Interval History:    The patient was last seen on 03/17/22. At that time issues with pain after urologic procedure.    He continues to deal with pain issue largely from prostate surgery. He still has some bleeding and some tissue come out. He tried different strategies and nothing helped. This is really impacting quality of life. He gets tired and frustrated and does not want to take it out on his family.    ROS:  A complete ROS was performed and is negative except as documented in the HPI.      Allergies:  Patient is allergic to cats [other] and dogs [other].    Past Medical History:   Diagnosis Date    Asthma     Atrial fibrillation (CMS-HCC)     Chronic HFrEF (heart failure with reduced ejection fraction) (CMS-HCC)     GERD (gastroesophageal reflux disease)     HTN (hypertension)     Insomnia     Nephrolithiasis     Sinusitis      Patient Active Problem List   Diagnosis    COPD (chronic obstructive pulmonary disease) (CMS-HCC)    Heart transplant, orthotopic, 01/20/2020    Pericardial effusion    Hypertension    Chronic back pain    At risk for infection transmitted from donor    Acute hepatitis C virus infection    Heart transplanted (CMS-HCC)    Acute UTI    Umbilical hernia without obstruction and without gangrene    COVID-19 virus detected    Acute medial meniscus tear of left knee, sequela    Localized osteoarthritis of left knee     Past Surgical History:   Procedure Laterality Date    CARDIAC DEFIBRILLATOR PLACEMENT       PB ANESTH,SHOULDER JOINT,NOS Right      Family History   Problem Relation Name Age of Onset    Hypertension Other      Other Maternal Grandmother          kidney disease needing HD     Social History     Socioeconomic History    Marital status: Single     Spouse name: Not on file    Number of children: Not on file    Years of education: Not on file    Highest education level: Not on file   Occupational History    Not on file   Tobacco Use    Smoking status: Never    Smokeless tobacco: Never    Tobacco comments:     from friends and relatives    Substance and Sexual Activity    Alcohol use: Not Currently     Comment: Prior heavier use, but completely quit in 2016    Drug use: Yes     Comment: eats edible marijuana for pain and insomnia     Sexual activity: Not on file   Other Topics Concern    Not on file   Social History Narrative      Born in [place], also lived in [place], [place], [place], no travel, worked as a carpenter, occasional cedar, no birds, no hot tubs, worked in construction + possible asbestos exposure      Social Determinants of Health     Financial Resource Strain: Not on file   Food Insecurity: Not on file   Transportation Needs: Not on file   Physical Activity: Not on file   Stress: Not on file   Social Connections: Not on file   Intimate Partner Violence: Not on file   Housing Stability: Not on file     Current Outpatient Medications   Medication Sig    albuterol 108 (90 Base) MCG/ACT inhaler Inhale 2 puffs by mouth every 4 hours as needed for Wheezing or Shortness of Breath.    aspirin 81 MG EC tablet Take 1 tablet (81 mg) by mouth daily.    baclofen (LIORESAL) 10 MG tablet Take 2 tablets (20 mg) by mouth nightly.    Blood Glucose Monitoring Suppl (TRUE METRIX METER) w/Device KIT Use as directed    budesonide-formoterol (SYMBICORT) 160-4.5 MCG/ACT inhaler Inhale 2 puffs by mouth every 12 hours.    bumetanide (BUMEX) 1 MG tablet Take 1 tablet (1 mg) by mouth daily as needed (fluid/weight  gain). Do not take unless instructed by Transplant team.    Calcium Carb-Cholecalciferol 600-10 MG-MCG TABS Take 1 tablet by mouth 2 times daily.    Cetirizine HCl (ZERVIATE) 0.24 % SOLN Place 1 drop into both eyes 2 times daily.    clindamycin (CLEOCIN T) 1 % solution Apply 1 Application. topically 2 times daily. Apply to the red bumps on your face up to two times a day.    controlled substance agreement controlled substance agreement    diclofenac (VOLTAREN) 1 % gel Apply 2 g topically 4 times daily.    docusate sodium (COLACE) 100 MG capsule Take 1 capsule (100 mg) by mouth 2 times daily.    DULoxetine (CYMBALTA) 30 MG CR capsule Take 1 capsule (30 mg) by mouth daily.    famotidine (PEPCID) 20 MG tablet Take 1 tablet (20 mg) by mouth 2 times daily.    fluticasone propionate (FLONASE) 50 MCG/ACT nasal spray Spray 1 spray into each nostril 2 times daily.    gabapentin (NEURONTIN) 300 MG capsule Take 1 capsule (300 mg) by mouth every morning AND 1 capsule (300 mg) daily AND 2 capsules (600 mg) every evening.    hydroCHLOROthiazide (HYDRODIURIL) 25 MG tablet Take 1 tablet (25 mg) by mouth daily.    ketoconazole (NIZORAL) 2 % shampoo Use shampoo daily for dandruff    lidocaine (LIDOCAINE PAIN RELIEF) 4 % patch Apply 1 patch topically every 24 hours. Leave patch on for 12 hours, then remove for 12 hours.    lisinopril (PRINIVIL, ZESTRIL) 10 MG tablet Take 2 tablets (20 mg) by mouth daily.    magnesium oxide (MAG-OX) 400 MG tablet Take 1 tablet by mouth daily    melatonin (GNP MELATONIN MAXIMUM STRENGTH) 5 MG tablet Take 2 tablets (10 mg) by mouth at bedtime.    Multiple Vitamin (MULTIVITAMIN) TABS tablet Take 1 tablet by mouth daily.    naloxone (KLOXXADO) 8 mg/0.1 mL nasal spray Call 911! Tilt head and spray intranasally into one nostril as needed for respiratory depression. If patient does not respond or responds and then relapses, repeat using a new nasal spray every 3 minutes until emergency medical assistance  arrives.    NEEDLE, DISP, 25 G 25G X   1" MISC Use to inject testosterone    NIFEdipine (ADALAT CC) 30 MG Controlled-Release tablet Take 1 tablet (30 mg) by mouth nightly.    ondansetron (ZOFRAN) 8 MG tablet Take 1 tablet (8 mg) by mouth every 8 hours as needed for Nausea/Vomiting.    oxyCODONE (ROXICODONE) 10 MG tablet Take 1 tab every 4 hours as needed for moderate pain and 2 tabs every 4 hours as needed for severe pain. Max 10 tabs per day, 28 day supply    phenazopyridine (PYRIDIUM) 100 MG tablet Take 1 tablet (100 mg) by mouth 3 times daily.    polyethylene glycol (GLYCOLAX) 17 GM/SCOOP powder Mix 17 grams in 4-8 oz of liquide and drink by mouth daily as needed (Constipation).    pravastatin (PRAVACHOL) 40 MG tablet Take 1 tablet (40 mg) by mouth every evening.    senna (SENOKOT) 8.6 MG tablet Take 1 tablet (8.6 mg) by mouth daily.    sirolimus (RAPAMUNE) 1 MG tablet Take 2 tablets (2 mg) by mouth every morning.    SYRINGE-NEEDLE, DISP, 3 ML (B-D 3CC LUER-LOK SYR 25GX1") 25G X 1" 3 ML MISC Use as directed to inject testosterone    SYRINGE-NEEDLE, DISP, 3 ML 18G X 1-1/2" 3 ML MISC Use to draw up testosterone    tacrolimus (ENVARSUS XR) 1 MG tablet STOP TAKING since 08/13/22 - remaining on chart for dose adjustments, titratable med.    tacrolimus (ENVARSUS XR) 4 MG tablet Take 1 tablet (4 mg) by mouth every morning.    tamsulosin (FLOMAX) 0.4 MG capsule Take 1 capsule (0.4 mg) by mouth daily.    tamsulosin (FLOMAX) 0.4 MG capsule Take 1 capsule (0.4 mg) by mouth daily.    testosterone cypionate (DEPO-TESTOSTERONE) 200 MG/ML SOLN Inject 1 ml into the muscle every 14 days    traZODone (DESYREL) 50 MG tablet Take 1 tablet (50 mg) by mouth nightly.     Current Facility-Administered Medications   Medication    diphenhydrAMINE (BENADRYL) injection 50 mg    diphenhydrAMINE (BENADRYL) tablet 50 mg     Immunization History   Administered Date(s) Administered    COVID-19 (Moderna) Low Dose Red Cap >= 18 Years 01/11/2021     COVID-19 (Moderna) Red Cap >= 12 Years 02/18/2020, 03/19/2020, 07/18/2020    Hep-A/Hep-B; Twinrix, Adult 02/11/2021    Influenza Vaccine (High Dose) Quadrivalent >=65 Years 10/03/2020    Influenza Vaccine (Unspecified) 08/01/2017    Influenza Vaccine >=6 Months 10/14/2010, 10/14/2011, 12/09/2012, 09/06/2014, 08/20/2018, 09/05/2019    Pneumococcal 13 Vaccine (PREVNAR-13) 10/03/2020    Pneumococcal 23 Vaccine (PNEUMOVAX-23) 10/14/2013, 02/11/2021    Tdap 12/02/2011   Deferred Date(s) Deferred    Pneumococcal 23 Vaccine (PNEUMOVAX-23) 02/02/2020     Physical Exam:  BP 102/69 (BP Location: Right arm, BP Patient Position: Sitting, BP cuff size: Large)   Pulse 98   Temp 98.5 F (36.9 C) (Temporal)   Resp 16   Ht 5' 10" (1.778 m)   Wt 96.2 kg (212 lb)   SpO2 97%   BMI 30.42 kg/m      General Appearance: ***alert, no distress, pleasant affect, cooperative.  Heart:  JVD ***, PMI ***, normal rate and regular rhythm, no murmurs, clicks, or gallops. ***  Lungs: ***clear to auscultation and percussion. No rales, rhonchi, or wheezes noted. No chest deformities noted.  Abdomen: ***BS normal.  Abdomen soft, non-tender.  No masses or organomegaly.  Extremities:  ***no cyanosis, clubbing, or edema. Has 2+ peripheral pulses.        Lab Data:  Lab Results   Component Value Date    BUN 26 (H) 08/13/2022    CREAT 1.98 (H) 08/13/2022    CL 99 08/13/2022    NA 140 08/13/2022    K 4.4 08/13/2022    CA 9.2 08/13/2022    TBILI 0.47 08/13/2022    ALB 4.1 08/13/2022    TP 7.1 08/13/2022    AST 22 08/13/2022    ALK 76 08/13/2022    BICARB 29 08/13/2022    ALT 25 08/13/2022    GLU 126 (H) 08/13/2022     Lab Results   Component Value Date    WBC 7.9 08/13/2022    RBC 5.50 08/13/2022    HGB 15.2 08/13/2022    HCT 46.5 08/13/2022    MCV 84.5 08/13/2022    MCHC 32.7 08/13/2022    RDW 12.3 08/13/2022    PLT 162 08/13/2022    MPV 11.6 08/13/2022     Lab Results   Component Value Date    A1C 5.7 01/10/2022     Lab Results   Component Value Date     TSH 1.63 01/10/2022     Lab Results   Component Value Date    CHOL 105 01/10/2022    HDL 38 01/10/2022    LDLCALC 43 01/10/2022    TRIG 121 01/10/2022     Lab Results   Component Value Date    SIROT 11.5 08/13/2022     Lab Results   Component Value Date    FKTR 6.5 08/13/2022     No results found for: CSATR  Lab Results   Component Value Date    CMVPL Not Detected 11/01/2021     Lab Results   Component Value Date    DSA ABSENT 08/13/2022       Prior Cardiovascular Studies:   Lab Results   Component Value Date    LV Ejection Fraction 59 02/06/2022          Echo 02/06/22  Summary:   1. The left ventricular size is normal. The left ventricular systolic function is normal.   2. No left ventricular hypertrophy.   3. Normal pattern of left ventricular diastolic filling.   4. EF=59%.   5. Compared to prior study EF now 59%, was 69% 03/01/21.     LHC/IVUS 02/04/22  CONCLUSION:                                                                   1. 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                              2. No angiographic evidence of coronary artery disease.                      3. Intimal thickness noted in LAD/LM up to 0.5 mm (Stable to slightly       worse compare to 2022).                                                         4. Non significant FFR at apical LAD.                                        5. Left ventricular end diastolic pressure appears normal.        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    Assessment/Plan:  # Hematuria  # Dysuria  # Chronic pain  Assessment: We had a long frank discussion about patient's chronic pain issues and the heart transplant team's role in this. I discussed with him that when I initially agreed to cover his chronic opiate prescription, this was the assumption that he would  have a provider versed in chronic pain after 3-4 months, but we are at 6 months and has unable to find one. Additionally, I had not put him on a pain contract at that time, but he recently used more opiates without asking and I informed him this was not appropriate, but because he had not established guidelines I was not going to stop at this time. However, going forward until he can establish with a pain physician, we will set up a pain contract and he will need to follow through like a usual pain clinic with us with goal of provider in 3-4 months or I may start tapering. I will augment adjuvant agents additionally for now and we can continue to work on this.  Plan:  -pain contract signed  -urine tox monthly  -clinic follow up month  -oxycodone 10 mg tablets PO, 1 tab every 4 hours moderate pain, 2 tabs every 4 hours for severe pain, no more than 10 tablets a day, total 280 per 28 days.   -diclofenac cream for joint pain  -lidocaine patch for back pain  -trial of pyridium  -increase gaba at night  -siro change as below  -cymbalta as below    # End-stage heart failure s/p orthotopic heart transplant  # Chronic Immunosuppression/Immunomodulation  Assessment: While we thought continuing sirolimus would help prevent recurrent scar tissue from prostate procedure, it may be exacerbating factors now with delayed wound healing. Will try mmf for 1 month.  Plan:   - continue envarsus 6 mg daily, goal trough 4-8  - HOLD sirolimus 3 mg daily, goal trough 4-8 for at least 1 month  - start mmf 1000 mg bid for one month to allow healing  - Continue to monitor for renal toxicities, infection risk and malignancy risk  - continue pravastatin 40 mg daily  - continue aspirin 81 mg daily    # Hypertension  Assessment: controlled  Plan:  -continue lisinopril 20 mg daily  -resume hctz  -nifedipine 30 mg daily    # Dyslipidemia  -continue pravastatin 40 mg daily    # Depression  Assessment: improved mood  Plan:  -increase cymbalta to 120  mg daily     RTC in 1 month       Nicholas W Wettersten, MD  Advanced Heart Failure, Mechanical Circulatory Support, Transplant  Pgr: [phone]

## 2018-05-05 NOTE — Progress Notes (Signed)
Internal Medicine Clinic Attending  Case discussed with Dr. Boswell  at the time of the visit.  We reviewed the resident's history and exam and pertinent patient test results.  I agree with the assessment, diagnosis, and plan of care documented in the resident's note.  Alexander N Raines, MD   

## 2018-05-05 NOTE — Telephone Encounter (Signed)
Received fax from [facility] with the following message regarding Wellbutrin xl prescription:   "[facility] stocks Wellbutrin SR 150mg -would MD consider changing rx? Please advise."   Will send request to MD last seen for review.Despina Hidden Cassady6/5/20199:52 AM

## 2018-05-05 NOTE — Assessment & Plan Note (Signed)
Emma Bonilla reports that she is having worsened depressive symptoms.  She notes that her sister recently passed away and her brother was recently placed on hospice care.  It appears that she is been on amitriptyline 50 mg nightly for several years for treatment of depression.  She also notes that in the past she had been on Wellbutrin for depression with good effect but has since discontinued it.  She has no history of seizures.  Assessment and plan: We will restart patient's Wellbutrin.  Follow-up with PCP in 6 to 8 weeks.

## 2018-05-05 NOTE — Assessment & Plan Note (Signed)
Ms. Valek was recently admitted to the hospital on 04/16/18 with chest pain.  Time she had mid substernal sharp chest pain for 2 days.  She was admitted for ACS rule out (did have a number of risk factors including prior drug-eluting stent x2 to the RCA 10/2017, hypertension, hyperlipidemia).  Her troponin was negative x3 with EKG reassuring and no ischemic changes.  No arrhythmias captured on telemetry.  She did have associated belching and did report that her Protonix prescription had been decreased from 40 to 20.  Her chest pain resolved without intervention.  She followed up with her cardiologist on 05/03/2018 and her Protonix prescription was corrected to 40 mg daily.  Today, she reports that she does have occasional substernal chest pain that is improving since resumption of her higher dose PPI.  She does not have any associated nausea, vomiting, diaphoresis or otherwise.  She is to follow-up with her cardiologist for a echocardiogram to assess for her aortic stenosis.  We will continue her higher dose PPI at 40 mg daily.  Discussed return precautions.

## 2018-05-07 ENCOUNTER — Other Ambulatory Visit: Payer: Self-pay

## 2018-05-07 ENCOUNTER — Ambulatory Visit (HOSPITAL_COMMUNITY): Payer: No Typology Code available for payment source | Attending: Internal Medicine

## 2018-05-07 DIAGNOSIS — I10 Essential (primary) hypertension: Secondary | ICD-10-CM | POA: Insufficient documentation

## 2018-05-07 DIAGNOSIS — E079 Disorder of thyroid, unspecified: Secondary | ICD-10-CM | POA: Insufficient documentation

## 2018-05-07 DIAGNOSIS — I493 Ventricular premature depolarization: Secondary | ICD-10-CM | POA: Insufficient documentation

## 2018-05-07 DIAGNOSIS — Z720 Tobacco use: Secondary | ICD-10-CM | POA: Insufficient documentation

## 2018-05-07 DIAGNOSIS — I350 Nonrheumatic aortic (valve) stenosis: Secondary | ICD-10-CM

## 2018-05-07 DIAGNOSIS — E785 Hyperlipidemia, unspecified: Secondary | ICD-10-CM | POA: Insufficient documentation

## 2018-05-07 DIAGNOSIS — G459 Transient cerebral ischemic attack, unspecified: Secondary | ICD-10-CM | POA: Insufficient documentation

## 2018-05-07 MED ORDER — PERFLUTREN LIPID MICROSPHERE
1.0000 mL | INTRAVENOUS | Status: AC | PRN
Start: 2018-05-07 — End: 2018-05-07
  Administered 2018-05-07: 2 mL via INTRAVENOUS

## 2018-06-01 ENCOUNTER — Other Ambulatory Visit: Payer: Self-pay | Admitting: *Deleted

## 2018-06-02 MED ORDER — OLOPATADINE HCL 0.2 % OP SOLN
1.0000 [drp] | Freq: Every day | OPHTHALMIC | 3 refills | Status: DC | PRN
Start: 2018-06-02 — End: 2018-09-17

## 2018-06-18 ENCOUNTER — Other Ambulatory Visit: Payer: Self-pay

## 2018-06-18 ENCOUNTER — Ambulatory Visit (INDEPENDENT_AMBULATORY_CARE_PROVIDER_SITE_OTHER): Payer: No Typology Code available for payment source | Admitting: Internal Medicine

## 2018-06-18 ENCOUNTER — Encounter: Payer: Self-pay | Admitting: Internal Medicine

## 2018-06-18 VITALS — BP 136/69 | HR 79 | Temp 98.2°F | Ht 67.0 in | Wt 234.5 lb

## 2018-06-18 DIAGNOSIS — F339 Major depressive disorder, recurrent, unspecified: Secondary | ICD-10-CM

## 2018-06-18 DIAGNOSIS — F32A Depression, unspecified: Secondary | ICD-10-CM

## 2018-06-18 DIAGNOSIS — Z0000 Encounter for general adult medical examination without abnormal findings: Secondary | ICD-10-CM

## 2018-06-18 DIAGNOSIS — Z79899 Other long term (current) drug therapy: Secondary | ICD-10-CM

## 2018-06-18 DIAGNOSIS — R011 Cardiac murmur, unspecified: Secondary | ICD-10-CM

## 2018-06-18 DIAGNOSIS — I10 Essential (primary) hypertension: Secondary | ICD-10-CM

## 2018-06-18 DIAGNOSIS — I350 Nonrheumatic aortic (valve) stenosis: Secondary | ICD-10-CM

## 2018-06-18 DIAGNOSIS — F329 Major depressive disorder, single episode, unspecified: Secondary | ICD-10-CM

## 2018-06-18 MED ORDER — LOSARTAN POTASSIUM-HCTZ 100-25 MG PO TABS: 1.0000 | ORAL_TABLET | Freq: Every day | ORAL | 5 refills | Status: DC

## 2018-06-18 MED ORDER — BUPROPION HCL ER (SR) 150 MG PO TB12
ORAL_TABLET | ORAL | 0 refills | Status: DC
Start: 2018-06-18 — End: 2018-09-14

## 2018-06-18 NOTE — Progress Notes (Signed)
CC: Follow up aortic stenosis, HTN, and depression   HPI:  [name] is a 62 y.o. female with PMH listed below who presents for follow-up of aortic stenosis, essential hypertension, and depression.  Depression: Patient has a long history of depression that has been well controlled on amitriptyline 50 mg daily.  Last month she lost 2 close family members to cancer and began experiencing symptoms of worsening depression.  At that time she reported using Wellbutrin in the past with good results and she was started on Wellbutrin 150 mg twice daily.  She reports feeling better today.  Appetite is improved and insomnia is resolved.  States she had a great relationship with her family members and denies any feelings of guilt.  Denies SI/HI.  Continue current regimen.  Provided refill for Wellbutrin at stated dose.  Essential HTN: Blood pressure at goal, 139/69 at today's visit.  She is currently on losartan 100 mg daily HCTZ 25 daily.  She is requesting to be switched to the combination pill Hyzaar to decrease the amount of pills she takes every day.  Sent prescription for Hyzaar 100-25 G daily to [facility] health department.  We will not make any changes to regimen.Recent BMP from 03/2018 with normal renal function and no electrolytes abnormalities.   Aortic stenosis, mild: Patient follows up with cardiology (Dr. Debara Pickett) for aortic stenosis.  She was recently seen in May of this year which time we repeated an echocardiogram that showed stable mild aortic stenosis with peak gradient at 22  Mm HG and valve area 1.71 cm^2.  Denies dyspnea on exertion, angina, and syncope.  Continue to follow-up with cardiology annually as Dr. Debara Pickett be recommended.  Health maintenance: Patient due for Pap smear next month. I have asked patient to schedule an appt with me for next month for this. I also offered the shingles vaccine. Patient thinks she has had it already. I asked to please call her pharmacy to verify  this. Id not, I will send prescription for it.   Past Medical History:  Diagnosis Date  . Arthritis   . Depression   . Diastolic dysfunction without heart failure   . GERD (gastroesophageal reflux disease)   . Hepatic steatosis    a. seen on prior US.  Marland Kitchen Hyperlipidemia   . Hypertension   . Mild aortic regurgitation   . Mild aortic stenosis   . Premature atrial contractions   . PVC's (premature ventricular contractions)   . Thyroid disease   . TIA (transient ischemic attack)    2010  . Tobacco abuse    Review of Systems:   Review of Systems  Constitutional: Negative for chills, fever and malaise/fatigue.  Respiratory: Negative for cough and shortness of breath.   Cardiovascular: Negative for chest pain, palpitations and leg swelling.  Gastrointestinal: Negative for abdominal pain, constipation, diarrhea, nausea and vomiting.  Neurological: Negative for dizziness and headaches.  Psychiatric/Behavioral: Negative for suicidal ideas. The patient does not have insomnia.     Physical Exam:  Vitals:   06/18/18 1321  BP: 136/69  Pulse: 79  Temp: 98.2 F (36.8 C)  TempSrc: Oral  SpO2: 98%  Weight: 234 lb 8 oz (106.4 kg)  Height: 5\' 7"  (1.702 m)   General: Very pleasant female, obese, well-developed, in no acute distress CV: RRR, crescendo-decrescendo II/VI murmur best heard at upper sternal borders Pulm: CTAB, no increased work of breathing room air Psych: Patient appears alert and oriented.  Reports her mood is fine.  Affect is appropriate.  Denies suicidal and homicidal ideations.  Good judgment and insight.  Assessment & Plan:   See Encounters Tab for problem based charting.  Patient discussed with Dr. Evette Doffing

## 2018-06-18 NOTE — Assessment & Plan Note (Signed)
Depression: Patient has a long history of depression that has been well controlled on amitriptyline 50 mg daily.  Last month she lost 2 close family members to cancer and began experiencing symptoms of worsening depression.  At that time she reported using Wellbutrin in the past with good results and she was started on Wellbutrin 150 mg twice daily.  She reports feeling better today.  Appetite is improved and insomnia is resolved.  States she had a great relationship with her family members and denies any feelings of guilt.  Denies SI/HI.  Continue current regimen.  Provided refill for Wellbutrin at stated dose.

## 2018-06-18 NOTE — Assessment & Plan Note (Signed)
Health maintenance: Patient due for Pap smear next month. I have asked patient to schedule an appt with me for next month for this. I also offered the shingles vaccine. Patient thinks she has had it already. I asked to please call her pharmacy to verify this. Id not, I will send prescription for it.

## 2018-06-18 NOTE — Assessment & Plan Note (Signed)
Aortic stenosis, mild: Patient follows up with cardiology (Dr. Debara Pickett) for aortic stenosis.  She was recently seen in May of this year which time we repeated an echocardiogram that showed stable mild aortic stenosis with peak gradient at 22  Mm HG and valve area 1.71 cm^2.  Denies dyspnea on exertion, angina, and syncope.  Continue to follow-up with cardiology annually as Dr. Debara Pickett be recommended.

## 2018-06-18 NOTE — Assessment & Plan Note (Signed)
Essential HTN: Blood pressure at goal, 139/69 at today's visit.  She is currently on losartan 100 mg daily HCTZ 25 daily.  She is requesting to be switched to the combination pill Hyzaar to decrease the amount of pills she takes every day.  Sent prescription for Hyzaar 100-25 G daily to [facility] health department.  We will not make any changes to regimen.Recent BMP from 03/2018 with normal renal function and no electrolytes abnormalities.

## 2018-06-18 NOTE — Patient Instructions (Addendum)
[name],  I am glad you are feeling better.  Please continue to take your amitriptyline and Wellbutrin as usual.   I sent refills for your blood pressure medicines to the [facility] health department.   Please remember to schedule a follow-up appointment for your Pap smear next month in September.  Otherwise I will see you back in 6 months.  Please call us if you have any questions or concerns.  - Dr. Frederico Hamman

## 2018-06-23 NOTE — Progress Notes (Signed)
Internal Medicine Clinic Attending  Case discussed with Dr. Santos-Sanchez at the time of the visit.  We reviewed the resident's history and exam and pertinent patient test results.  I agree with the assessment, diagnosis, and plan of care documented in the resident's note.    

## 2018-07-14 ENCOUNTER — Other Ambulatory Visit (HOSPITAL_COMMUNITY)
Admission: RE | Admit: 2018-07-14 | Discharge: 2018-07-14 | Disposition: A | Discharge status: Home / Self Care | Payer: No Typology Code available for payment source | Source: Ambulatory Visit | Attending: Internal Medicine | Admitting: Internal Medicine

## 2018-07-14 ENCOUNTER — Ambulatory Visit (INDEPENDENT_AMBULATORY_CARE_PROVIDER_SITE_OTHER): Payer: No Typology Code available for payment source | Admitting: Internal Medicine

## 2018-07-14 DIAGNOSIS — Z124 Encounter for screening for malignant neoplasm of cervix: Secondary | ICD-10-CM | POA: Insufficient documentation

## 2018-07-14 DIAGNOSIS — Z8619 Personal history of other infectious and parasitic diseases: Secondary | ICD-10-CM

## 2018-07-14 NOTE — Patient Instructions (Signed)
Ms. Nixon,   We will let you know the results of your Pap smear soon as we have them.  Please call us if you have any questions or concerns.  - Dr. Frederico Hamman

## 2018-07-15 ENCOUNTER — Encounter: Payer: Self-pay | Admitting: Internal Medicine

## 2018-07-15 LAB — HIV ANTIBODY (ROUTINE TESTING W REFLEX): HIV Screen 4th Generation wRfx: NONREACTIVE

## 2018-07-15 LAB — RPR: RPR Ser Ql: NONREACTIVE

## 2018-07-15 NOTE — Assessment & Plan Note (Signed)
Emma Bonilla presents for a Pap smear today. Her most recent one was in 2016 and was normal. She denies urinary symptoms and vaginal pain, itching, and discharge. She is in a monogamous relationship with her husband of many years and does not use condoms. She has been diagnosed with chlamydia in the remote past prior to being married. Will perform pap smear and STD testing today.  - Follow up Pap cytology and STD testing   ADDENDUM: STD testing negative including GC/chlamydia, RPR and HIV

## 2018-07-15 NOTE — Progress Notes (Signed)
   CC: Pap smear   HPI:  Ms.Burnice E Chea is a 62 y.o. female with PMH listed below who presents to clinic for a Pap smear.   Ms. Provencher presents for a Pap smear today. Her most recent one was in 2016 and was normal. She denies urinary symptoms and vaginal pain, itching, and discharge. She is in a monogamous relationship with her husband of many years and does not use condoms. She has been diagnosed with chlamydia in the remote past prior to being married. Will perform pap smear and STD testing today.  - Follow up Pap cytology and STD testing   ADDENDUM: STD testing negative including GC/chlamydia, RPR and HIV   Past Medical History:  Diagnosis Date  . Arthritis   . Depression   . Diastolic dysfunction without heart failure   . GERD (gastroesophageal reflux disease)   . Hepatic steatosis    a. seen on prior US.  Marland Kitchen Hyperlipidemia   . Hypertension   . Mild aortic regurgitation   . Mild aortic stenosis   . Premature atrial contractions   . PVC's (premature ventricular contractions)   . Thyroid disease   . TIA (transient ischemic attack)    2010  . Tobacco abuse    Review of Systems:   Review of Systems  Constitutional: Negative for chills, fever and malaise/fatigue.  Genitourinary: Negative for dysuria, frequency, hematuria and urgency.   Physical Exam: Vitals:   07/14/18 1340  BP: 118/78  Pulse: 84  Temp: 98.3 F (36.8 C)  TempSrc: Oral  SpO2: 97%  Weight: 231 lb 1.6 oz (104.8 kg)   General: Well-appearing female in no acute distress GU: labia appeared normal without erythema, rashes or lesions. Vaginal wall well rugated with no discharge noted. Cervix appears pink with lesions. No bleeding noted.   Assessment & Plan:   See Encounters Tab for problem based charting.  Patient discussed with Dr. Dareen Piano

## 2018-07-16 LAB — CERVICOVAGINAL ANCILLARY ONLY
Bacterial vaginitis: NEGATIVE
CANDIDA VAGINITIS: NEGATIVE
CHLAMYDIA, DNA PROBE: NEGATIVE
Neisseria Gonorrhea: NEGATIVE
Trichomonas: NEGATIVE

## 2018-07-16 LAB — CYTOLOGY - PAP
Diagnosis: NEGATIVE
HPV: NOT DETECTED

## 2018-07-16 NOTE — Progress Notes (Signed)
Internal Medicine Clinic Attending  Case discussed with Dr. Santos-Sanchez at the time of the visit.  We reviewed the resident's history and exam and pertinent patient test results.  I agree with the assessment, diagnosis, and plan of care documented in the resident's note.    

## 2018-07-28 ENCOUNTER — Other Ambulatory Visit: Payer: Self-pay | Admitting: *Deleted

## 2018-07-28 MED ORDER — MOMETASONE FUROATE 50 MCG/ACT NA SUSP: 2.0000 | Freq: Every day | NASAL | 2 refills | Status: DC | PRN

## 2018-08-23 ENCOUNTER — Other Ambulatory Visit: Payer: Self-pay | Admitting: *Deleted

## 2018-08-23 MED ORDER — LEVOTHYROXINE SODIUM 100 MCG PO TABS: 100.0000 ug | ORAL_TABLET | Freq: Every day | ORAL | 3 refills | Status: DC

## 2018-09-14 ENCOUNTER — Other Ambulatory Visit: Payer: Self-pay

## 2018-09-14 DIAGNOSIS — F329 Major depressive disorder, single episode, unspecified: Secondary | ICD-10-CM

## 2018-09-14 DIAGNOSIS — F32A Depression, unspecified: Secondary | ICD-10-CM

## 2018-09-14 MED ORDER — BUPROPION HCL ER (SR) 150 MG PO TB12: 150.0000 mg | ORAL_TABLET | Freq: Two times a day (BID) | ORAL | 3 refills | Status: DC

## 2018-09-14 NOTE — Telephone Encounter (Signed)
buPROPion (WELLBUTRIN SR) 150 MG 12 hr tablet(Expired), refill request @  [place], [place] - [place] [phone] (Phone) [phone] (Fax)

## 2018-09-17 ENCOUNTER — Other Ambulatory Visit: Payer: Self-pay | Admitting: *Deleted

## 2018-09-17 MED ORDER — OLOPATADINE HCL 0.2 % OP SOLN: 1.0000 [drp] | Freq: Every day | OPHTHALMIC | 3 refills | Status: DC | PRN

## 2018-10-05 ENCOUNTER — Other Ambulatory Visit: Payer: Self-pay | Admitting: Internal Medicine

## 2018-10-24 ENCOUNTER — Other Ambulatory Visit: Payer: Self-pay | Admitting: Internal Medicine

## 2018-11-14 ENCOUNTER — Other Ambulatory Visit: Payer: Self-pay | Admitting: Internal Medicine

## 2018-12-06 ENCOUNTER — Other Ambulatory Visit: Payer: Self-pay | Admitting: Internal Medicine

## 2018-12-07 NOTE — Telephone Encounter (Signed)
Next appt scheduled 12/24/18 with PCP.

## 2018-12-10 ENCOUNTER — Other Ambulatory Visit: Payer: Self-pay | Admitting: Internal Medicine

## 2018-12-13 NOTE — Telephone Encounter (Addendum)
Received message transmission has failed  - Amitriptyline rx called to [facility] on [address].

## 2018-12-15 ENCOUNTER — Ambulatory Visit: Payer: No Typology Code available for payment source

## 2018-12-17 ENCOUNTER — Other Ambulatory Visit: Payer: Self-pay | Admitting: Internal Medicine

## 2018-12-22 ENCOUNTER — Ambulatory Visit: Payer: No Typology Code available for payment source

## 2018-12-24 ENCOUNTER — Ambulatory Visit (HOSPITAL_COMMUNITY)
Admission: RE | Admit: 2018-12-24 | Discharge: 2018-12-24 | Disposition: A | Discharge status: Home / Self Care | Payer: Self-pay | Source: Ambulatory Visit | Attending: Internal Medicine | Admitting: Internal Medicine

## 2018-12-24 ENCOUNTER — Encounter: Payer: Self-pay | Admitting: Internal Medicine

## 2018-12-24 ENCOUNTER — Other Ambulatory Visit: Payer: Self-pay | Admitting: Internal Medicine

## 2018-12-24 ENCOUNTER — Other Ambulatory Visit: Payer: Self-pay

## 2018-12-24 ENCOUNTER — Ambulatory Visit (INDEPENDENT_AMBULATORY_CARE_PROVIDER_SITE_OTHER): Payer: Self-pay | Admitting: Internal Medicine

## 2018-12-24 VITALS — BP 116/77 | HR 85 | Temp 98.2°F | Wt 242.8 lb

## 2018-12-24 DIAGNOSIS — Z79899 Other long term (current) drug therapy: Secondary | ICD-10-CM

## 2018-12-24 DIAGNOSIS — F32A Depression, unspecified: Secondary | ICD-10-CM

## 2018-12-24 DIAGNOSIS — I10 Essential (primary) hypertension: Secondary | ICD-10-CM

## 2018-12-24 DIAGNOSIS — R053 Chronic cough: Secondary | ICD-10-CM

## 2018-12-24 DIAGNOSIS — Z0000 Encounter for general adult medical examination without abnormal findings: Secondary | ICD-10-CM

## 2018-12-24 DIAGNOSIS — E039 Hypothyroidism, unspecified: Secondary | ICD-10-CM

## 2018-12-24 DIAGNOSIS — E782 Mixed hyperlipidemia: Secondary | ICD-10-CM

## 2018-12-24 DIAGNOSIS — Z79890 Hormone replacement therapy (postmenopausal): Secondary | ICD-10-CM

## 2018-12-24 DIAGNOSIS — Z9861 Coronary angioplasty status: Secondary | ICD-10-CM

## 2018-12-24 DIAGNOSIS — F17200 Nicotine dependence, unspecified, uncomplicated: Secondary | ICD-10-CM

## 2018-12-24 DIAGNOSIS — Z23 Encounter for immunization: Secondary | ICD-10-CM

## 2018-12-24 DIAGNOSIS — R05 Cough: Secondary | ICD-10-CM

## 2018-12-24 DIAGNOSIS — F329 Major depressive disorder, single episode, unspecified: Secondary | ICD-10-CM

## 2018-12-24 DIAGNOSIS — Z720 Tobacco use: Secondary | ICD-10-CM

## 2018-12-24 DIAGNOSIS — E785 Hyperlipidemia, unspecified: Secondary | ICD-10-CM

## 2018-12-24 DIAGNOSIS — Z7902 Long term (current) use of antithrombotics/antiplatelets: Secondary | ICD-10-CM

## 2018-12-24 DIAGNOSIS — I2510 Atherosclerotic heart disease of native coronary artery without angina pectoris: Secondary | ICD-10-CM

## 2018-12-24 NOTE — Patient Instructions (Addendum)
Ms. Weisensel,   Your blood pressure is under excellent control. Continue taking your medications as usual.   For your cough, I order a chest x ray and pulmonary function tests. You can go to the radiology today for the x ray. The hospital will call you to schedule the pulmonary function tests.   I will call you with the results of your labs.   You received the flu shot today.   Call us if you have any questions.   Follow up in 6 months.   - Dr. Frederico Hamman

## 2018-12-25 LAB — T4, FREE: Free T4: 1.39 ng/dL (ref 0.82–1.77)

## 2018-12-25 LAB — TSH: TSH: 1.76 u[IU]/mL (ref 0.450–4.500)

## 2018-12-26 ENCOUNTER — Encounter: Payer: Self-pay | Admitting: Internal Medicine

## 2018-12-26 MED ORDER — PANTOPRAZOLE SODIUM 40 MG PO TBEC: 40.0000 mg | DELAYED_RELEASE_TABLET | Freq: Every day | ORAL | 1 refills | Status: DC

## 2018-12-26 MED ORDER — CETIRIZINE HCL 10 MG PO TABS: 10.0000 mg | ORAL_TABLET | Freq: Every day | ORAL | 1 refills | Status: DC

## 2018-12-26 MED ORDER — AMITRIPTYLINE HCL 25 MG PO TABS: 50.0000 mg | ORAL_TABLET | Freq: Every day | ORAL | 1 refills | Status: DC

## 2018-12-26 NOTE — Assessment & Plan Note (Addendum)
Reports quitting 2 days ago, but granddaughter who is present in the room denies this and states she has been smoking every day. She seems interested in quitting but not today. She is already on wellbutrin for depression. I offered other NRTs but she would prefer to quit "cold [place]." will continue to address during future visits. Advised her to call if she would like to start using nicotine patches. Provided quit line information.

## 2018-12-26 NOTE — Assessment & Plan Note (Addendum)
Patient continues to take both ASA and Plavix since her PCI in 10/2017. Per cardiologist note, plan was to do ASA and Plavix x 3 months and then continue only with Plavix. Discussed with patient. She will stop taking ASA and continue taking her current dose of Plavix. Denies any chest pain since stent placement.

## 2018-12-26 NOTE — Progress Notes (Signed)
   CC: Chronic cough and follow up of CAD, HTN, HLD, hypothyroidism   HPI:  [name] is a 63 y.o. female with PMH listed below who presents to clinic for evaluation of cChronic cough and follow up of CAD, HTN, HLD, hypothyroidism. Please see problem based assessment and plan for further details.   Past Medical History:  Diagnosis Date  . Arthritis   . Depression   . Diastolic dysfunction without heart failure   . GERD (gastroesophageal reflux disease)   . Hepatic steatosis    a. seen on prior US.  Marland Kitchen Hyperlipidemia   . Hypertension   . Mild aortic regurgitation   . Mild aortic stenosis   . Premature atrial contractions   . PVC's (premature ventricular contractions)   . Thyroid disease   . TIA (transient ischemic attack)    2010  . Tobacco abuse    Review of Systems:   Review of Systems  Constitutional: Negative for chills, diaphoresis, fever, malaise/fatigue and weight loss.  Respiratory: Positive for cough and sputum production. Negative for hemoptysis, shortness of breath and wheezing.   Cardiovascular: Negative for chest pain, palpitations and leg swelling.  Gastrointestinal: Negative for abdominal pain, nausea and vomiting.  Neurological: Positive for headaches. Negative for dizziness.    Physical Exam:  Vitals:   12/24/18 1309  BP: 116/77  Pulse: 85  Temp: 98.2 F (36.8 C)  TempSrc: Oral  SpO2: 95%  Weight: 242 lb 12.8 oz (110.1 kg)   General: well-appearing female in NAD  Cardiac: RRR, II/VI systolic murmur best heard at  USBs consistent with aortic stenosis and unchanged from prior exam  Pulm: CTAB, no wheezes or crackles, no increased work of breathing  Ext: warm and well perfused, no peripheral edema, no clubbing noted =   Assessment & Plan:   See Encounters Tab for problem based charting.  Patient discussed with Dr. Evette Doffing

## 2018-12-26 NOTE — Assessment & Plan Note (Signed)
Well controlled on current regimen. Will continue.  

## 2018-12-26 NOTE — Assessment & Plan Note (Signed)
Emma Bonilla reports a productive cough of white and sometimes green sputum for the past 3 months. Has been using robitussin with some relief. She denies associated fevers, chills, night sweats, weight loss, myalgias, or sick contacts. She is a smoker and reports quitting 2 days ago. One pack lasts her 3 days. She also reports smoking marijuana 3-4x per week. She has not noticed worsening cough while smoking or association with food intake.   On exam, she is not tachycardic or tachypneic. She is oxygenating well on RA. Weight is down 4lbs since last visit. Her lung exam is unremarkable, CTAB.   Possible etiologies include smoker's cough, PNA, COPD, malignancy. Currently on PPI with appropriate intake. Not on an ACEi. Will order CXR and PFTs for further evaluation.

## 2018-12-26 NOTE — Assessment & Plan Note (Signed)
Compliant with synthroid 100 mcg. Last TSH normal in 10/2017. Will check TSH today.

## 2018-12-26 NOTE — Assessment & Plan Note (Signed)
On atorvastatin 40 mg QD. Most recent lipid panel on 03/2018 showed LDL 85. Her cardiologist added Zetia after this.Reports compliance with both. She has follow up with her cardiologist next month with plan to repeat lipid panel then.

## 2018-12-26 NOTE — Assessment & Plan Note (Signed)
Received flu shot today. Referred for mammogram as well.

## 2018-12-27 ENCOUNTER — Other Ambulatory Visit: Payer: Self-pay | Admitting: Internal Medicine

## 2018-12-27 NOTE — Progress Notes (Signed)
Internal Medicine Clinic Attending  Case discussed with Dr. Santos-Sanchez at the time of the visit.  We reviewed the resident's history and exam and pertinent patient test results.  I agree with the assessment, diagnosis, and plan of care documented in the resident's note.    

## 2019-01-03 ENCOUNTER — Other Ambulatory Visit: Payer: Self-pay | Admitting: Internal Medicine

## 2019-01-04 ENCOUNTER — Ambulatory Visit (HOSPITAL_COMMUNITY)
Admission: RE | Admit: 2019-01-04 | Discharge: 2019-01-04 | Disposition: A | Discharge status: Home / Self Care | Payer: Self-pay | Source: Ambulatory Visit | Attending: Internal Medicine | Admitting: Internal Medicine

## 2019-01-04 DIAGNOSIS — R053 Chronic cough: Secondary | ICD-10-CM

## 2019-01-04 DIAGNOSIS — R05 Cough: Secondary | ICD-10-CM | POA: Insufficient documentation

## 2019-01-04 LAB — PULMONARY FUNCTION TEST
DL/VA % pred: 94 %
DL/VA: 3.9 ml/min/mmHg/L
DLCO unc % pred: 82 %
DLCO unc: 18.19 ml/min/mmHg
FEF 25-75 Post: 3.75 L/sec
FEF 25-75 Pre: 2.49 L/sec
FEF2575-%Change-Post: 50 %
FEF2575-%PRED-PRE: 112 %
FEF2575-%Pred-Post: 169 %
FEV1-%Change-Post: 10 %
FEV1-%PRED-POST: 113 %
FEV1-%Pred-Pre: 102 %
FEV1-Post: 2.64 L
FEV1-Pre: 2.38 L
FEV1FVC-%Change-Post: 0 %
FEV1FVC-%Pred-Pre: 103 %
FEV6-%CHANGE-POST: 9 %
FEV6-%Pred-Post: 111 %
FEV6-%Pred-Pre: 101 %
FEV6-Post: 3.18 L
FEV6-Pre: 2.9 L
FEV6FVC-%Change-Post: 0 %
FEV6FVC-%Pred-Post: 102 %
FEV6FVC-%Pred-Pre: 102 %
FVC-%Change-Post: 10 %
FVC-%PRED-PRE: 98 %
FVC-%Pred-Post: 108 %
FVC-PRE: 2.91 L
FVC-Post: 3.2 L
PRE FEV1/FVC RATIO: 82 %
Post FEV1/FVC ratio: 82 %
Post FEV6/FVC ratio: 99 %
Pre FEV6/FVC Ratio: 99 %
RV % pred: 108 %
RV: 2.37 L
TLC % pred: 100 %
TLC: 5.52 L

## 2019-01-04 MED ORDER — ALBUTEROL SULFATE (2.5 MG/3ML) 0.083% IN NEBU
2.5000 mg | INHALATION_SOLUTION | Freq: Once | RESPIRATORY_TRACT | Status: AC
  Administered 2019-01-04: 2.5 mg via RESPIRATORY_TRACT

## 2019-01-12 ENCOUNTER — Other Ambulatory Visit: Payer: Self-pay | Admitting: Internal Medicine

## 2019-01-12 DIAGNOSIS — F32A Depression, unspecified: Secondary | ICD-10-CM

## 2019-01-12 DIAGNOSIS — F329 Major depressive disorder, single episode, unspecified: Secondary | ICD-10-CM

## 2019-01-13 ENCOUNTER — Telehealth: Payer: Self-pay

## 2019-01-13 ENCOUNTER — Other Ambulatory Visit: Payer: Self-pay | Admitting: Internal Medicine

## 2019-01-13 DIAGNOSIS — F32A Depression, unspecified: Secondary | ICD-10-CM

## 2019-01-13 DIAGNOSIS — F329 Major depressive disorder, single episode, unspecified: Secondary | ICD-10-CM

## 2019-01-13 MED ORDER — BUPROPION HCL ER (SR) 150 MG PO TB12: 150.0000 mg | ORAL_TABLET | Freq: Two times a day (BID) | ORAL | 3 refills | Status: DC

## 2019-01-13 NOTE — Telephone Encounter (Signed)
Received fax from pharmacy requesting order clarification on Bupropion SR 150mg  prescribed on 01/12/19.  Message from pharmacist states:  "Pt has been on med for sometime at #1PO BID dosing, Please consider changing quantity to #60".  Please review directions on Sig and resend Rx if necessary. Thank you, SChaplin, RN,BSN

## 2019-01-13 NOTE — Telephone Encounter (Signed)
Done! Prescription sent for 60 tablets to take BID. Thanks!

## 2019-01-20 ENCOUNTER — Other Ambulatory Visit: Payer: Self-pay | Admitting: Internal Medicine

## 2019-01-21 ENCOUNTER — Other Ambulatory Visit: Payer: Self-pay | Admitting: Internal Medicine

## 2019-01-27 ENCOUNTER — Other Ambulatory Visit: Payer: Self-pay | Admitting: Internal Medicine

## 2019-01-28 ENCOUNTER — Other Ambulatory Visit: Payer: Self-pay | Admitting: Internal Medicine

## 2019-01-28 DIAGNOSIS — Z1231 Encounter for screening mammogram for malignant neoplasm of breast: Secondary | ICD-10-CM

## 2019-02-08 ENCOUNTER — Other Ambulatory Visit (HOSPITAL_COMMUNITY): Payer: Self-pay | Admitting: *Deleted

## 2019-02-08 DIAGNOSIS — Z1231 Encounter for screening mammogram for malignant neoplasm of breast: Secondary | ICD-10-CM

## 2019-02-09 ENCOUNTER — Other Ambulatory Visit: Payer: Self-pay | Admitting: Internal Medicine

## 2019-02-09 NOTE — Telephone Encounter (Signed)
Next appt scheduled  7/24 with PCP. 

## 2019-02-10 NOTE — Telephone Encounter (Signed)
I think this was a one time prescription only that I sent when her pharmacy had the combination pill no back order. Is she requesting the pills separately or the combination pill?

## 2019-02-10 NOTE — Telephone Encounter (Signed)
Called pt - stated she needs the two pills separately.

## 2019-02-28 ENCOUNTER — Other Ambulatory Visit: Payer: Self-pay | Admitting: Internal Medicine

## 2019-03-18 ENCOUNTER — Other Ambulatory Visit: Payer: Self-pay

## 2019-03-18 MED ORDER — PANTOPRAZOLE SODIUM 40 MG PO TBEC: 40.0000 mg | DELAYED_RELEASE_TABLET | Freq: Every day | ORAL | 1 refills | Status: DC

## 2019-04-07 ENCOUNTER — Other Ambulatory Visit: Payer: Self-pay | Admitting: Internal Medicine

## 2019-04-14 ENCOUNTER — Other Ambulatory Visit: Payer: Self-pay | Admitting: Internal Medicine

## 2019-04-14 ENCOUNTER — Telehealth: Payer: Self-pay | Admitting: *Deleted

## 2019-04-14 MED ORDER — OLOPATADINE HCL 0.7 % OP SOLN: 1.0000 [drp] | Freq: Every day | OPHTHALMIC | 3 refills | Status: DC

## 2019-04-14 NOTE — Telephone Encounter (Signed)
Fax from [facility] - [place] 0.2 % eye drop is no longer available for free - wants to know if u could change it to [facility] and send new rx Thanks

## 2019-04-14 NOTE — Telephone Encounter (Signed)
Pazeo 0.7% 1 drop in each eye QD sent to [facility].

## 2019-04-28 ENCOUNTER — Other Ambulatory Visit: Payer: Self-pay | Admitting: Internal Medicine

## 2019-05-06 ENCOUNTER — Other Ambulatory Visit: Payer: Self-pay | Admitting: Internal Medicine

## 2019-05-06 ENCOUNTER — Encounter: Payer: Self-pay | Admitting: *Deleted

## 2019-05-08 ENCOUNTER — Other Ambulatory Visit: Payer: Self-pay | Admitting: Internal Medicine

## 2019-05-12 ENCOUNTER — Ambulatory Visit
Admission: RE | Admit: 2019-05-12 | Discharge: 2019-05-12 | Disposition: A | Discharge status: Home / Self Care | Payer: No Typology Code available for payment source | Source: Ambulatory Visit | Attending: Obstetrics and Gynecology | Admitting: Obstetrics and Gynecology

## 2019-05-12 ENCOUNTER — Encounter (HOSPITAL_COMMUNITY): Payer: Self-pay

## 2019-05-12 ENCOUNTER — Other Ambulatory Visit: Payer: Self-pay

## 2019-05-12 ENCOUNTER — Ambulatory Visit (HOSPITAL_COMMUNITY)
Admission: RE | Admit: 2019-05-12 | Discharge: 2019-05-12 | Disposition: A | Discharge status: Home / Self Care | Payer: Self-pay | Source: Ambulatory Visit | Attending: Obstetrics and Gynecology | Admitting: Obstetrics and Gynecology

## 2019-05-12 VITALS — BP 136/84 | Temp 98.1°F | Wt 249.0 lb

## 2019-05-12 DIAGNOSIS — Z1239 Encounter for other screening for malignant neoplasm of breast: Secondary | ICD-10-CM

## 2019-05-12 DIAGNOSIS — Z1231 Encounter for screening mammogram for malignant neoplasm of breast: Secondary | ICD-10-CM

## 2019-05-12 NOTE — Progress Notes (Signed)
No complaints today.   Pap Smear: Pap smear not completed today. Last Pap smear was 07/14/2018 at [facility] and normal with negative HPV. Per patient has a history of an abnormal Pap smear 40 years ago that a D&C or CKC was completed. Patient states all Pap smears have been normal since. Last Pap smear result is in Epic.  Physical exam: Breasts Breasts symmetrical. No skin abnormalities bilateral breasts. No nipple retraction bilateral breasts. No nipple discharge bilateral breasts. No lymphadenopathy. No lumps palpated bilateral breasts. No complaints of pain or tenderness on exam. Referred patient to the [facility] for a screening mammogram. Appointment scheduled for Thursday, May 12, 2019 at 1620.        Pelvic/Bimanual No Pap smear completed today since last Pap smear and HPV typing was 07/14/2018. Pap smear not indicated per BCCCP guidelines.   Smoking History: Patient is a former smoker that quit in May 2019.  Patient Navigation: Patient education provided. Access to services provided for patient through [facility] program.   Colorectal Cancer Screening: Patient had a colonoscopy completed 01/26/2017. No complaints today.   Breast and Cervical Cancer Risk Assessment: Patient has no family history of breast cancer, known genetic mutations, or radiation treatment to the chest before age 39. Per patient has no history of cervical dysplasia. Patient has no history of being immunocompromised or DES exposure in-utero.  Risk Assessment    Risk Scores      05/12/2019   Last edited by: Armond Hang, LPN   5-year risk: 1.2 %   Lifetime risk: 4.9 %

## 2019-05-12 NOTE — Patient Instructions (Signed)
Explained breast self awareness with [name]. Patient did not need a Pap smear today due to last Pap smear and HPV typing was 07/14/2018. Let her know BCCCP will cover Pap smears and HPV typing every 5 years unless has a history of abnormal Pap smears. Referred patient to the [facility] for a screening mammogram. Appointment scheduled for Thursday, May 12, 2019 at 1620. Patient aware of appointment and will be there. Let patient know the [facility] will follow up with her within the next couple weeks with results of mammogram by letter or phone. [name] verbalized understanding.  Olman Yono, Arvil Chaco, RN 3:38 PM

## 2019-05-18 ENCOUNTER — Encounter (HOSPITAL_COMMUNITY): Payer: Self-pay | Admitting: *Deleted

## 2019-05-24 ENCOUNTER — Other Ambulatory Visit: Payer: Self-pay | Admitting: Internal Medicine

## 2019-05-27 ENCOUNTER — Telehealth: Payer: Self-pay | Admitting: Internal Medicine

## 2019-05-27 NOTE — Telephone Encounter (Signed)

## 2019-05-30 ENCOUNTER — Encounter: Payer: Self-pay | Admitting: Internal Medicine

## 2019-05-30 ENCOUNTER — Telehealth (INDEPENDENT_AMBULATORY_CARE_PROVIDER_SITE_OTHER): Payer: No Typology Code available for payment source | Admitting: Internal Medicine

## 2019-05-30 VITALS — Ht 67.0 in | Wt 240.0 lb

## 2019-05-30 DIAGNOSIS — Z9861 Coronary angioplasty status: Secondary | ICD-10-CM

## 2019-05-30 DIAGNOSIS — I2510 Atherosclerotic heart disease of native coronary artery without angina pectoris: Secondary | ICD-10-CM

## 2019-05-30 DIAGNOSIS — I10 Essential (primary) hypertension: Secondary | ICD-10-CM

## 2019-05-30 DIAGNOSIS — E782 Mixed hyperlipidemia: Secondary | ICD-10-CM

## 2019-05-30 DIAGNOSIS — R0602 Shortness of breath: Secondary | ICD-10-CM

## 2019-05-30 DIAGNOSIS — Z955 Presence of coronary angioplasty implant and graft: Secondary | ICD-10-CM

## 2019-05-30 DIAGNOSIS — I350 Nonrheumatic aortic (valve) stenosis: Secondary | ICD-10-CM

## 2019-05-30 NOTE — Progress Notes (Signed)
Virtual Visit via Telephone Note   This visit type was conducted due to national recommendations for restrictions regarding the COVID-19 Pandemic (e.g. social distancing) in an effort to limit this patient's exposure and mitigate transmission in our community.  Due to her co-morbid illnesses, this patient is at least at moderate risk for complications without adequate follow up.  This format is felt to be most appropriate for this patient at this time.  The patient did not have access to video technology/had technical difficulties with video requiring transitioning to audio format only (telephone).  All issues noted in this document were discussed and addressed.  No physical exam could be performed with this format.  Please refer to the patient's chart for her  consent to telehealth for [facility].   Evaluation Performed:  Telephone follow-up  Date:  05/30/2019   ID:  Emma Bonilla, DOB 01-20-1956, MRN [id number]  Patient Location:  [place] [address]  Provider location:   [address] [place], [place] [address]  PCP:  Welford Roche, MD  Cardiologist:  No primary care provider on file. Electrophysiologist:  None   Chief Complaint: Shortness of breath with exertion  History of Present Illness:    Emma Bonilla is a 63 y.o. female who presents via audio/video conferencing for a telehealth visit today.  Emma Bonilla is seen today for a telehealth follow-up.  Overall she is without complaints.  She does report some worsening shortness of breath with exertion but denies any chest pain.  She has a history of coronary artery disease with PCI to the RCA overlapping stents in November 2018.  She is also had palpitations which are resolved and has mild aortic stenosis and moderate aortic insufficiency.  A repeat echo in 2019 showed that this was stable.  She has hypertension and dyslipidemia but was not able to get her vital signs today.  She says  she has a follow-up with her PCP in the office in the near future.  The patient does not have symptoms concerning for COVID-19 infection (fever, chills, cough, or new SHORTNESS OF BREATH).    Prior CV studies:   The following studies were reviewed today:  Chart reviewed  PMHx:  Past Medical History:  Diagnosis Date   Arthritis    Depression    Diastolic dysfunction without heart failure    GERD (gastroesophageal reflux disease)    Hepatic steatosis    a. seen on prior US.   Hyperlipidemia    Hypertension    Mild aortic regurgitation    Mild aortic stenosis    Premature atrial contractions    PVC's (premature ventricular contractions)    Thyroid disease    TIA (transient ischemic attack)    2010   Tobacco abuse     Past Surgical History:  Procedure Laterality Date   CORONARY STENT INTERVENTION N/A 10/13/2017   Procedure: CORONARY STENT INTERVENTION;  Surgeon: Leonie Man, MD;  Location: [facility];  Service: Cardiovascular;  Laterality: N/A;   DILATION AND CURETTAGE OF UTERUS     30 years ago   LEFT HEART CATH AND CORONARY ANGIOGRAPHY N/A 10/13/2017   Procedure: LEFT HEART CATH AND CORONARY ANGIOGRAPHY;  Surgeon: Leonie Man, MD;  Location: [facility];  Service: Cardiovascular;  Laterality: N/A;    FAMHx:  Family History  Problem Relation Age of Onset   Hypertension Mother    Emphysema Mother    Cancer Mother  lung   Heart failure Mother        CHF   Heart disease Father        MI at 90   Hypertension Father    Crohn's disease Father    Heart disease Sister        LVAD (2014) - MI in her 14s   Hypertension Sister    Colon cancer Sister        26 Mets   Kidney cancer Sister    Skin cancer Maternal Grandmother    Stroke Maternal Grandfather    Diabetes Brother    Valvular heart disease Sister        leaky valve   Hypertension Sister    Hyperlipidemia Sister    Hypertension Sister     Crohn's disease Sister    Hypertension Child    Hyperlipidemia Child    Hyperlipidemia Child     SOCHx:   reports that she has quit smoking. Her smoking use included cigarettes. She has a 22.50 pack-year smoking history. She has never used smokeless tobacco. She reports current alcohol use. She reports current drug use. Frequency: 1.00 time per week. Drug: Marijuana.  ALLERGIES:  Allergies  Allergen Reactions   Naproxen Sodium Rash    MEDS:  Current Meds  Medication Sig   amitriptyline (ELAVIL) 25 MG tablet TAKE 2 TABLETS BY MOUTH AT BEDTIME   atorvastatin (LIPITOR) 40 MG tablet TAKE 1 TABLET BY MOUTH ONCE DAILY AT 6 PM   buPROPion (WELLBUTRIN SR) 150 MG 12 hr tablet Take 1 tablet (150 mg total) by mouth 2 (two) times daily.   calcium-vitamin D (OSCAL WITH D) 500-200 MG-UNIT per tablet Take 1 tablet by mouth daily. (Patient taking differently: Take 1 tablet by mouth 2 (two) times daily. )   cetirizine (ZYRTEC) 10 MG tablet TAKE 1 TABLET (10MG  TOTAL) BY MOUTH DAILY   clopidogrel (PLAVIX) 75 MG tablet TAKE 1 TABLET BY MOUTH ONCE DAILY WITH BREAKFAST   ezetimibe (ZETIA) 10 MG tablet Take 1 tablet (10 mg total) by mouth daily. OV NEEDED   ferrous sulfate 325 (65 FE) MG tablet Take 1 tablet (325 mg total) by mouth daily.   hydrochlorothiazide (HYDRODIURIL) 25 MG tablet TAKE ONE TABLET BY MOUTH ONCE A DAY ALONG WITH ONE LOSARTAN 100MG  TABLET   hydrochlorothiazide (MICROZIDE) 12.5 MG capsule Take 2 capsules (25 mg total) by mouth daily.   hydrocortisone (ANUSOL-HC) 2.5 % rectal cream Place 1 application rectally 2 (two) times daily.   levothyroxine (SYNTHROID) 100 MCG tablet Take 1 tablet (100 mcg total) by mouth daily.   losartan (COZAAR) 100 MG tablet TAKE ONE TABLET BY MOUTH ONCE A DAY ALONG WITH ONE HCTZ 25MG  TABLET   Melatonin 3 MG TABS Take 3 mg by mouth at bedtime.   metoprolol succinate (TOPROL-XL) 25 MG 24 hr tablet Take 0.5 tablets (12.5 mg total) by mouth  daily.   NASONEX 50 MCG/ACT nasal spray PLACE 2 SPRAYS INTO THE NOSE DAILY AS NEEDED.   Olopatadine HCl (PAZEO) 0.7 % SOLN Apply 1 drop to eye daily.     ROS: Pertinent items noted in HPI and remainder of comprehensive ROS otherwise negative.  Labs/Other Tests and Data Reviewed:    Recent Labs: 12/24/2018: TSH 1.760   Recent Lipid Panel Lab Results  Component Value Date/Time   CHOL 139 03/05/2018 09:28 AM   TRIG 80 03/05/2018 09:28 AM   HDL 38 (L) 03/05/2018 09:28 AM   CHOLHDL 3.7 03/05/2018 09:28 AM   CHOLHDL  5.1 10/14/2017 02:45 AM   LDLCALC 85 03/05/2018 09:28 AM    Wt Readings from Last 3 Encounters:  05/30/19 240 lb (108.9 kg)  05/12/19 249 lb (112.9 kg)  12/24/18 242 lb 12.8 oz (110.1 kg)     Exam:    Vital Signs:  Ht 5\' 7"  (1.702 m)    Wt 240 lb (108.9 kg)    LMP 02/11/2011 Comment: no chance of pregnancy per pt   BMI 37.59 kg/m    Exam not performed due to virtual visit  ASSESSMENT & PLAN:    1. Unstable angina - s/p PCI to the RCA with overlapping DES (10/2017) 2. Palpitations - PACs and PVCs - resolved with beta blocker 3. Mild aortic stenosis with a functionally bicuspid valve, moderate aortic insufficiency, EF 65-70% 4. Hypertension 5. Dyslipidemia 6. GERD  Emma Bonilla is doing well without any current chest pain symptoms.  Her palpitations have resolved.  She had aortic stenosis which was mild but stable.  She is reporting some shortness of breath with exertion.  Not clear what this could be related to her valvular heart disease or not.  Since her last echo was a year ago would recommend a repeat at this time.  An upcoming visit with her PCP, I will defer lipid testing to that office visit.  We will contact her with those results but otherwise plan follow-up annually or sooner if needed.  COVID-19 Education: The signs and symptoms of COVID-19 were discussed with the patient and how to seek care for testing (follow up with PCP or arrange E-visit).  The  importance of social distancing was discussed today.  Patient Risk:   After full review of this patients clinical status, I feel that they are at least moderate risk at this time.  Time:   Today, I have spent 25 minutes with the patient with telehealth technology discussing dyspnea, aortic stenosis/insufficiency, coronary artery disease.     Medication Adjustments/Labs and Tests Ordered: Current medicines are reviewed at length with the patient today.  Concerns regarding medicines are outlined above.   Tests Ordered: Orders Placed This Encounter  Procedures   ECHOCARDIOGRAM COMPLETE    Medication Changes: No orders of the defined types were placed in this encounter.   Disposition:  in 1 year(s)  Pixie Casino, MD, [facility], [place] Director of the Advanced Lipid Disorders &  Cardiovascular Risk Reduction Clinic Diplomate of the American Board of Clinical Lipidology Attending Cardiologist  Direct Dial: [phone]   Fax: [phone]  Website:  [email or web address]  Pixie Casino, MD  05/30/2019 5:03 PM

## 2019-05-30 NOTE — Patient Instructions (Signed)
Medication Instructions:  Your physician recommends that you continue on your current medications as directed. Please refer to the Current Medication list given to you today.  If you need a refill on your cardiac medications before your next appointment, please call your pharmacy.    Testing/Procedures: Your physician has requested that you have an echocardiogram. Echocardiography is a painless test that uses sound waves to create images of your heart. It provides your doctor with information about the size and shape of your heart and how well your heart's chambers and valves are working. This procedure takes approximately one hour. There are no restrictions for this procedure. -- done at [address]  Follow-Up: At [facility], you and your health needs are our priority.  As part of our continuing mission to provide you with exceptional heart care, we have created designated Provider Care Teams.  These Care Teams include your primary Cardiologist (physician) and Advanced Practice Providers (APPs -  Physician Assistants and Nurse Practitioners) who all work together to provide you with the care you need, when you need it. You will need a follow up appointment in 6 months.  Please call our office 2 months in advance to schedule this appointment.  You may see Dr. Debara Pickett or one of the following Advanced Practice Providers on your designated Care Team: Almyra Deforest, [place] . Fabian Sharp, PA-C  Any Other Special Instructions Will Be Listed Below (If Applicable).

## 2019-06-08 ENCOUNTER — Ambulatory Visit (HOSPITAL_COMMUNITY): Payer: No Typology Code available for payment source | Attending: Cardiovascular Disease

## 2019-06-08 ENCOUNTER — Other Ambulatory Visit: Payer: Self-pay

## 2019-06-08 DIAGNOSIS — I350 Nonrheumatic aortic (valve) stenosis: Secondary | ICD-10-CM

## 2019-06-08 DIAGNOSIS — R0602 Shortness of breath: Secondary | ICD-10-CM

## 2019-06-14 ENCOUNTER — Other Ambulatory Visit: Payer: Self-pay | Admitting: Internal Medicine

## 2019-06-14 DIAGNOSIS — F329 Major depressive disorder, single episode, unspecified: Secondary | ICD-10-CM

## 2019-06-14 DIAGNOSIS — F32A Depression, unspecified: Secondary | ICD-10-CM

## 2019-06-20 ENCOUNTER — Other Ambulatory Visit: Payer: Self-pay | Admitting: Internal Medicine

## 2019-06-20 ENCOUNTER — Other Ambulatory Visit: Payer: Self-pay

## 2019-06-20 ENCOUNTER — Encounter: Payer: Self-pay | Admitting: Internal Medicine

## 2019-06-20 ENCOUNTER — Ambulatory Visit: Payer: Self-pay | Admitting: Internal Medicine

## 2019-06-20 VITALS — BP 124/66 | HR 83 | Temp 98.2°F | Ht 67.0 in | Wt 249.9 lb

## 2019-06-20 DIAGNOSIS — R0683 Snoring: Secondary | ICD-10-CM

## 2019-06-20 DIAGNOSIS — F329 Major depressive disorder, single episode, unspecified: Secondary | ICD-10-CM

## 2019-06-20 DIAGNOSIS — E782 Mixed hyperlipidemia: Secondary | ICD-10-CM

## 2019-06-20 DIAGNOSIS — K219 Gastro-esophageal reflux disease without esophagitis: Secondary | ICD-10-CM

## 2019-06-20 DIAGNOSIS — K648 Other hemorrhoids: Secondary | ICD-10-CM

## 2019-06-20 DIAGNOSIS — E785 Hyperlipidemia, unspecified: Secondary | ICD-10-CM

## 2019-06-20 DIAGNOSIS — G4733 Obstructive sleep apnea (adult) (pediatric): Secondary | ICD-10-CM | POA: Insufficient documentation

## 2019-06-20 DIAGNOSIS — F419 Anxiety disorder, unspecified: Secondary | ICD-10-CM

## 2019-06-20 DIAGNOSIS — Z0000 Encounter for general adult medical examination without abnormal findings: Secondary | ICD-10-CM

## 2019-06-20 DIAGNOSIS — I10 Essential (primary) hypertension: Secondary | ICD-10-CM

## 2019-06-20 DIAGNOSIS — F32A Depression, unspecified: Secondary | ICD-10-CM

## 2019-06-20 DIAGNOSIS — E039 Hypothyroidism, unspecified: Secondary | ICD-10-CM

## 2019-06-20 DIAGNOSIS — G4700 Insomnia, unspecified: Secondary | ICD-10-CM

## 2019-06-20 DIAGNOSIS — Z79890 Hormone replacement therapy (postmenopausal): Secondary | ICD-10-CM

## 2019-06-20 DIAGNOSIS — Z79899 Other long term (current) drug therapy: Secondary | ICD-10-CM

## 2019-06-20 LAB — POCT GLYCOSYLATED HEMOGLOBIN (HGB A1C): Hemoglobin A1C: 6 % — AB (ref 4.0–5.6)

## 2019-06-20 LAB — GLUCOSE, CAPILLARY: Glucose-Capillary: 103 mg/dL — ABNORMAL HIGH (ref 70–99)

## 2019-06-20 MED ORDER — PANTOPRAZOLE SODIUM 40 MG PO TBEC: 40.0000 mg | DELAYED_RELEASE_TABLET | Freq: Every day | ORAL | 2 refills | Status: DC

## 2019-06-20 NOTE — Patient Instructions (Addendum)
Ms. Wassenaar,   We will let you know when your labs results are back. I also ordered a sleep study to check for sleep apnea. Call us if you have any questions or concerns. Follow up 6 months.   - Dr. Frederico Hamman

## 2019-06-20 NOTE — Progress Notes (Signed)
   CC: Follow up for HTN, HLD, hypothyroidism, and depression   HPI:  [name] is a 63 y.o. year-old female with PMH listed below who presents to clinic for follow up for HTN, HLD, hypothyroidism, and depression. Please see problem based assessment and plan for further details.   Past Medical History:  Diagnosis Date  . Arthritis   . Depression   . Diastolic dysfunction without heart failure   . GERD (gastroesophageal reflux disease)   . Hepatic steatosis    a. seen on prior US.  Marland Kitchen Hyperlipidemia   . Hypertension   . Mild aortic regurgitation   . Mild aortic stenosis   . Premature atrial contractions   . PVC's (premature ventricular contractions)   . Thyroid disease   . TIA (transient ischemic attack)    2010  . Tobacco abuse     Review of Systems:   Review of Systems  Constitutional: Negative for chills, fever, malaise/fatigue and weight loss.  Respiratory: Negative for cough.   Cardiovascular: Positive for PND. Negative for orthopnea and leg swelling.  Gastrointestinal: Negative for abdominal pain, constipation, diarrhea, nausea and vomiting.  Psychiatric/Behavioral: Positive for depression. Negative for suicidal ideas. The patient is nervous/anxious and has insomnia.     Physical Exam:  Vitals:   06/20/19 1331  BP: 124/66  Pulse: 83  Temp: 98.2 F (36.8 C)  TempSrc: Oral  SpO2: 98%  Weight: 249 lb 14.4 oz (113.4 kg)  Height: 5\' 7"  (1.702 m)    General: well-appearing female, in no acute distress  Cardiac: regular rate and rhythm, nl S1/S2, no murmurs, rubs or gallops Pulm: CTAB, no wheezes or crackles, no increased work of breathing on room air  Abd: soft, NTND, bowel sounds present Ext: warm and well perfused, no peripheral edema Psych: Patient describes mood as sad. Affect is appropriate. Appears attentive and has appropriate behavior. Speech is low/normal/pressure. Concentration. Denies VH, AH, SI, Hi. Judgement and insight are good.      Office Visit from 06/20/2019 in [facility]  PHQ-9 Total Score  14      Assessment & Plan:   See Encounters Tab for problem based charting.  Patient discussed with Dr. Evette Doffing

## 2019-06-20 NOTE — Progress Notes (Signed)
60

## 2019-06-21 LAB — BMP8+ANION GAP
Anion Gap: 16 mmol/L (ref 10.0–18.0)
BUN/Creatinine Ratio: 9 — ABNORMAL LOW (ref 12–28)
BUN: 10 mg/dL (ref 8–27)
CO2: 27 mmol/L (ref 20–29)
Calcium: 9.3 mg/dL (ref 8.7–10.3)
Chloride: 99 mmol/L (ref 96–106)
Creatinine, Ser: 1.1 mg/dL — ABNORMAL HIGH (ref 0.57–1.00)
GFR calc Af Amer: 62 mL/min/{1.73_m2} (ref >59)
GFR calc non Af Amer: 54 mL/min/{1.73_m2} — ABNORMAL LOW (ref >59)
Glucose: 100 mg/dL — ABNORMAL HIGH (ref 65–99)
Potassium: 3.7 mmol/L (ref 3.5–5.2)
Sodium: 142 mmol/L (ref 134–144)

## 2019-06-21 LAB — LIPID PANEL
Chol/HDL Ratio: 3.3 ratio (ref 0.0–4.4)
Cholesterol, Total: 114 mg/dL (ref 100–199)
HDL: 35 mg/dL — ABNORMAL LOW (ref >39)
LDL Calculated: 64 mg/dL (ref 0–99)
Triglycerides: 74 mg/dL (ref 0–149)
VLDL Cholesterol Cal: 15 mg/dL (ref 5–40)

## 2019-06-22 ENCOUNTER — Encounter: Payer: Self-pay | Admitting: Internal Medicine

## 2019-06-22 NOTE — Assessment & Plan Note (Signed)
Patient reports having elevated blood pressure readings at home.  Maximum systolic BP 681 and maximum diastolic 75.  She states she takes her blood pressure after doing chores around the house.  We discussed the appropriate way of taking her blood pressure at home.  She is at goal today at 122/64.  No adjustment to medicines.  Continue HCTZ 25 and losartan 100.

## 2019-06-22 NOTE — Assessment & Plan Note (Signed)
Patient's symptoms are usually well controlled on amitriptyline and bupropion. She presents today complaining of anxiety and insomnia as well as feeling sadness and depressed. PHQ 9 is 14.  She reports 1 of her sister died recently in her sleep from an MI and she has been anxious since then and trying to cope with this.  She has a lot of support at home from her husband and children. Denies SI/HI. She also has been out of her bupropion for 2 weeks. Will not adjust medications at this time as she has been out of her medicines. In addition, she stated the medications do help with symptoms when she takes them. I refilled it today and will reassess in 4-8 weeks.

## 2019-06-22 NOTE — Assessment & Plan Note (Signed)
Patient reports episodes of walking up gasping for her that started after her sister died in her sleep recently. She believes this is associated with anxiety. She also reports she snores and that her husband has mentioned she stops breathing in her sleep. Endorses daily fatigue. No morning HA. STOP BANG score 7. Ordered sleep study.

## 2019-06-22 NOTE — Assessment & Plan Note (Signed)
Last TSH normal 6 months ago. Will repeat yearly.  Continue Synthroid at current dose.

## 2019-06-22 NOTE — Assessment & Plan Note (Signed)
Patient's cardiologist requested a repeat lipid profile her LDL was 85 on the last one 4 months ago and her goal will be less than 70.  LDL now 65.  Continue atorvastatin 40 and Zetia 10.  Routed results to cardiologist.

## 2019-06-22 NOTE — Assessment & Plan Note (Signed)
Patient requesting to be screened for diabetes which is appropriate.  Last A1c was 5.8 one year ago.  Risk factors include obesity, African-American race, and family history of diabetes.  Ordered A1c.

## 2019-06-23 NOTE — Progress Notes (Signed)
Internal Medicine Clinic Attending  Case discussed with Dr. Santos-Sanchez at the time of the visit.  We reviewed the resident's history and exam and pertinent patient test results.  I agree with the assessment, diagnosis, and plan of care documented in the resident's note.    

## 2019-06-24 ENCOUNTER — Encounter: Payer: Self-pay | Admitting: Internal Medicine

## 2019-07-06 ENCOUNTER — Other Ambulatory Visit: Payer: Self-pay

## 2019-07-06 ENCOUNTER — Ambulatory Visit: Payer: Self-pay | Admitting: Internal Medicine

## 2019-07-06 ENCOUNTER — Ambulatory Visit: Payer: Self-pay

## 2019-07-06 VITALS — BP 127/83 | HR 98 | Temp 98.2°F | Ht 67.0 in | Wt 252.4 lb

## 2019-07-06 DIAGNOSIS — R109 Unspecified abdominal pain: Secondary | ICD-10-CM

## 2019-07-06 LAB — POCT URINALYSIS DIPSTICK
Bilirubin, UA: NEGATIVE
Blood, UA: NEGATIVE
Glucose, UA: NEGATIVE
Ketones, UA: NEGATIVE
Leukocytes, UA: NEGATIVE
Nitrite, UA: NEGATIVE
Protein, UA: NEGATIVE
Spec Grav, UA: 1.02 (ref 1.010–1.025)
Urobilinogen, UA: 0.2 E.U./dL
pH, UA: 7.5 (ref 5.0–8.0)

## 2019-07-06 NOTE — Patient Instructions (Signed)
Visit Summary:  1. Abdominal Pain - I am glad to hear that your abdominal pain has resolved. Please keep your 6 month follow up appointment with you PCP and let us know if your pain returns.    Best regards,  Marianna Payment, DO

## 2019-07-08 DIAGNOSIS — R109 Unspecified abdominal pain: Secondary | ICD-10-CM | POA: Insufficient documentation

## 2019-07-08 NOTE — Progress Notes (Signed)
Internal Medicine Clinic Attending  I saw and evaluated the patient.  I personally confirmed the key portions of the history and exam documented by Dr. Coe and I reviewed pertinent patient test results.  The assessment, diagnosis, and plan were formulated together and I agree with the documentation in the resident's note.    

## 2019-07-08 NOTE — Progress Notes (Signed)
   CC: Abdominal Pain  HPI:  [name] is a 63 y.o. female with a past medical history stated below and presented with a 2 week history of abdominal pain. Please see problem based assessment and plan for more details.  Past Medical History:  Diagnosis Date  . Arthritis   . Depression   . Diastolic dysfunction without heart failure   . GERD (gastroesophageal reflux disease)   . Hepatic steatosis    a. seen on prior US.  Marland Kitchen Hyperlipidemia   . Hypertension   . Mild aortic regurgitation   . Mild aortic stenosis   . Premature atrial contractions   . PVC's (premature ventricular contractions)   . Thyroid disease   . TIA (transient ischemic attack)    2010  . Tobacco abuse     Review of Systems:  Review of Systems  Constitutional: Negative for chills, fever and malaise/fatigue.  Respiratory: Negative for shortness of breath.   Cardiovascular: Negative for chest pain and leg swelling.  Gastrointestinal: Negative for abdominal pain, blood in stool, constipation, diarrhea, melena, nausea and vomiting.  Genitourinary: Negative for dysuria, flank pain, frequency, hematuria and urgency.  Musculoskeletal: Negative for back pain and joint pain.  Neurological: Negative for weakness.     Physical Exam:  Vitals:   07/06/19 1510  BP: 127/83  Pulse: 98  Temp: 98.2 F (36.8 C)  TempSrc: Oral  SpO2: 97%  Weight: 252 lb 6.4 oz (114.5 kg)  Height: 5\' 7"  (1.702 m)   Physical Exam  Constitutional: She is oriented to person, place, and time and well-developed, well-nourished, and in no distress.  Cardiovascular: Normal rate, regular rhythm, normal heart sounds and intact distal pulses. Exam reveals no gallop and no friction rub.  No murmur heard. Pulmonary/Chest: Effort normal and breath sounds normal.  Abdominal: Soft. Normal appearance and bowel sounds are normal. She exhibits no distension and no mass. There is no abdominal tenderness. There is no CVA tenderness.   Neurological: She is alert and oriented to person, place, and time.  Skin: Skin is warm and dry.     Assessment & Plan:   See Encounters Tab for problem based charting.  Patient seen with Dr. Lynnae January

## 2019-07-08 NOTE — Assessment & Plan Note (Signed)
Patient states that abdominal pain started roughly two weeks ago. The pain started on her right flank and radiated into her groin. The pain waxed and waned, with periods of sharp pain that she rates at 9 to 10/10. During this time she admits to dysuria with hematuria. On Monday (08/03) the pain abruptly stopped. She has not had any abdominal pain or dysuria since.    Plan: - Performed UA to rule out UTI. - Patient likely had a kidney stone. I counseled the patient on the importance of staying hydrated.  - Told her to call us if she experiences any pain in the future.

## 2019-07-12 ENCOUNTER — Other Ambulatory Visit: Payer: Self-pay | Admitting: Internal Medicine

## 2019-07-16 ENCOUNTER — Other Ambulatory Visit (HOSPITAL_COMMUNITY)
Admission: RE | Admit: 2019-07-16 | Discharge: 2019-07-16 | Disposition: A | Discharge status: Home / Self Care | Payer: No Typology Code available for payment source | Source: Ambulatory Visit | Attending: Internal Medicine | Admitting: Internal Medicine

## 2019-07-16 DIAGNOSIS — Z20828 Contact with and (suspected) exposure to other viral communicable diseases: Secondary | ICD-10-CM | POA: Insufficient documentation

## 2019-07-16 DIAGNOSIS — Z01812 Encounter for preprocedural laboratory examination: Secondary | ICD-10-CM | POA: Insufficient documentation

## 2019-07-16 LAB — SARS CORONAVIRUS 2 (TAT 6-24 HRS): SARS Coronavirus 2: NEGATIVE

## 2019-07-19 ENCOUNTER — Other Ambulatory Visit: Payer: Self-pay

## 2019-07-19 ENCOUNTER — Ambulatory Visit (HOSPITAL_BASED_OUTPATIENT_CLINIC_OR_DEPARTMENT_OTHER)
Payer: Self-pay | Attending: Student in an Organized Health Care Education/Training Program | Admitting: Internal Medicine

## 2019-07-19 DIAGNOSIS — E669 Obesity, unspecified: Secondary | ICD-10-CM | POA: Insufficient documentation

## 2019-07-19 DIAGNOSIS — Z6839 Body mass index (BMI) 39.0-39.9, adult: Secondary | ICD-10-CM | POA: Insufficient documentation

## 2019-07-19 DIAGNOSIS — F329 Major depressive disorder, single episode, unspecified: Secondary | ICD-10-CM | POA: Insufficient documentation

## 2019-07-19 DIAGNOSIS — I10 Essential (primary) hypertension: Secondary | ICD-10-CM | POA: Insufficient documentation

## 2019-07-19 DIAGNOSIS — R0683 Snoring: Secondary | ICD-10-CM

## 2019-07-19 DIAGNOSIS — Z79899 Other long term (current) drug therapy: Secondary | ICD-10-CM | POA: Insufficient documentation

## 2019-07-19 DIAGNOSIS — G4763 Sleep related bruxism: Secondary | ICD-10-CM | POA: Insufficient documentation

## 2019-07-19 DIAGNOSIS — G4733 Obstructive sleep apnea (adult) (pediatric): Secondary | ICD-10-CM | POA: Insufficient documentation

## 2019-07-24 NOTE — Procedures (Signed)
   Patient Name: Emma Bonilla, Emma Bonilla Date: 07/19/2019 Gender: Female D.O.B: 02/14/1956 Age (years): 19 Referring Provider: Welford Roche MD Height (inches): 82 Interpreting Physician: Baird Lyons MD, ABSM Weight (lbs): 250 RPSGT: Jacolyn Reedy BMI: 39 MRN: [id number] Neck Size: 18.00  CLINICAL INFORMATION Sleep Study Type: NPSG Indication for sleep study: Depression, Hypertension, Morning Headaches, Obesity, Snoring, Witnesses Apnea / Gasping During Sleep Epworth Sleepiness Score: 6  SLEEP STUDY TECHNIQUE As per the AASM Manual for the Scoring of Sleep and Associated Events v2.3 (April 2016) with a hypopnea requiring 4% desaturations.  The channels recorded and monitored were frontal, central and occipital EEG, electrooculogram (EOG), submentalis EMG (chin), nasal and oral airflow, thoracic and abdominal wall motion, anterior tibialis EMG, snore microphone, electrocardiogram, and pulse oximetry.  MEDICATIONS Medications self-administered by patient taken the night of the study : AMITRIPTYLINE, BUPROPION, MELATONIN, METOPROLOL  SLEEP ARCHITECTURE The study was initiated at 10:53:14 PM and ended at 5:09:21 AM.  Sleep onset time was 17.0 minutes and the sleep efficiency was 89.1%%. The total sleep time was 335.1 minutes.  Stage REM latency was 121.0 minutes.  The patient spent 7.1%% of the night in stage N1 sleep, 75.9%% in stage N2 sleep, 0.0%% in stage N3 and 17% in REM.  Alpha intrusion was absent.  Supine sleep was 48.64%.  RESPIRATORY PARAMETERS The overall apnea/hypopnea index (AHI) was 18.3 per hour. There were 3 total apneas, including 3 obstructive, 0 central and 0 mixed apneas. There were 99 hypopneas and 21 RERAs.  The AHI during Stage REM sleep was 50.5 per hour.  AHI while supine was 19.1 per hour.  The mean oxygen saturation was 92.2%. The minimum SpO2 during sleep was 78.0%.  soft snoring was noted during this study.  CARDIAC DATA The 2  lead EKG demonstrated sinus rhythm. The mean heart rate was 82.7 beats per minute. Other EKG findings include: None.  LEG MOVEMENT DATA The total PLMS were 0 with a resulting PLMS index of 0.0. Associated arousal with leg movement index was 0.0 .  IMPRESSIONS - Moderate obstructive sleep apnea occurred during this study (AHI = 18.3/h). - No significant central sleep apnea occurred during this study (CAI = 0.0/h). - Oxygen desaturation was noted during this study (Min O2 = 78.0%). Mean sat 92.2%. - The patient snored with soft snoring volume. - No cardiac abnormalities were noted during this study. Occasional PVC. - Clinically significant periodic limb movements did not occur during sleep. No significant associated arousals. - Bruxism noted.  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 Other options would be based on clinical judgment. - Consider oral bite guard for bruxism - Be careful with alcohol, sedatives and other CNS depressants that may worsen sleep apnea and disrupt normal sleep architecture. - Sleep hygiene should be reviewed to assess factors that may improve sleep quality. - Weight management and regular exercise should be initiated or continued if appropriate.  [Electronically signed] 08/02/2019 08:53 AM  Baird Lyons MD, ABSM Diplomate, American Board of Sleep Medicine   NPI: [id number]                          [place], [place] [facility]  ELECTRONICALLY SIGNED ON:  07/24/2019, 12:40 PM [place] PH: [phone]   FX: [phone] [place]

## 2019-07-25 ENCOUNTER — Other Ambulatory Visit: Payer: Self-pay | Admitting: Internal Medicine

## 2019-07-25 DIAGNOSIS — F32A Depression, unspecified: Secondary | ICD-10-CM

## 2019-07-25 DIAGNOSIS — F329 Major depressive disorder, single episode, unspecified: Secondary | ICD-10-CM

## 2019-07-26 ENCOUNTER — Other Ambulatory Visit: Payer: Self-pay | Admitting: Internal Medicine

## 2019-08-02 DIAGNOSIS — R0683 Snoring: Secondary | ICD-10-CM

## 2019-08-03 ENCOUNTER — Other Ambulatory Visit: Payer: Self-pay | Admitting: Internal Medicine

## 2019-08-03 DIAGNOSIS — G4733 Obstructive sleep apnea (adult) (pediatric): Secondary | ICD-10-CM | POA: Insufficient documentation

## 2019-08-15 ENCOUNTER — Other Ambulatory Visit: Payer: Self-pay | Admitting: Internal Medicine

## 2019-08-16 ENCOUNTER — Other Ambulatory Visit: Payer: Self-pay | Admitting: Internal Medicine

## 2019-08-23 ENCOUNTER — Other Ambulatory Visit: Payer: Self-pay | Admitting: Internal Medicine

## 2019-09-01 ENCOUNTER — Other Ambulatory Visit (HOSPITAL_COMMUNITY): Payer: No Typology Code available for payment source

## 2019-09-04 ENCOUNTER — Encounter (HOSPITAL_BASED_OUTPATIENT_CLINIC_OR_DEPARTMENT_OTHER): Payer: No Typology Code available for payment source | Admitting: Internal Medicine

## 2019-09-13 ENCOUNTER — Other Ambulatory Visit (HOSPITAL_COMMUNITY)
Admission: RE | Admit: 2019-09-13 | Discharge: 2019-09-13 | Disposition: A | Discharge status: Home / Self Care | Payer: No Typology Code available for payment source | Source: Ambulatory Visit | Attending: Internal Medicine | Admitting: Internal Medicine

## 2019-09-13 DIAGNOSIS — Z01812 Encounter for preprocedural laboratory examination: Secondary | ICD-10-CM | POA: Insufficient documentation

## 2019-09-13 DIAGNOSIS — Z20828 Contact with and (suspected) exposure to other viral communicable diseases: Secondary | ICD-10-CM | POA: Insufficient documentation

## 2019-09-14 LAB — NOVEL CORONAVIRUS, NAA (HOSP ORDER, SEND-OUT TO REF LAB; TAT 18-24 HRS): SARS-CoV-2, NAA: NOT DETECTED

## 2019-09-16 ENCOUNTER — Ambulatory Visit (HOSPITAL_BASED_OUTPATIENT_CLINIC_OR_DEPARTMENT_OTHER)
Payer: No Typology Code available for payment source | Attending: Student in an Organized Health Care Education/Training Program | Admitting: Internal Medicine

## 2019-09-16 ENCOUNTER — Other Ambulatory Visit: Payer: Self-pay

## 2019-09-16 VITALS — Ht 67.0 in | Wt 250.0 lb

## 2019-09-16 DIAGNOSIS — G4733 Obstructive sleep apnea (adult) (pediatric): Secondary | ICD-10-CM

## 2019-09-19 ENCOUNTER — Other Ambulatory Visit: Payer: Self-pay

## 2019-09-20 ENCOUNTER — Other Ambulatory Visit: Payer: Self-pay | Admitting: Internal Medicine

## 2019-09-22 ENCOUNTER — Other Ambulatory Visit: Payer: Self-pay | Admitting: Internal Medicine

## 2019-09-22 DIAGNOSIS — K219 Gastro-esophageal reflux disease without esophagitis: Secondary | ICD-10-CM

## 2019-09-24 DIAGNOSIS — G4733 Obstructive sleep apnea (adult) (pediatric): Secondary | ICD-10-CM

## 2019-09-24 NOTE — Procedures (Signed)
Patient Name: Emma Bonilla, Boak Date: 09/16/2019 Gender: Female D.O.B: February 24, 1956 Age (years): 31 Referring Provider: Welford Roche MD Height (inches): 67 Interpreting Physician: Baird Lyons MD, ABSM Weight (lbs): 250 RPSGT: Baxter Flattery BMI: 39 MRN: [id number] Neck Size: 18.00  CLINICAL INFORMATION The patient is referred for a CPAP titration to treat sleep apnea.  Date of NPSG, Split Night or HST:  NPSG 07/19/2019  AHI 18.3/ hr, desaturation to 78%, body weight 250 lbs  SLEEP STUDY TECHNIQUE As per the AASM Manual for the Scoring of Sleep and Associated Events v2.3 (April 2016) with a hypopnea requiring 4% desaturations.  The channels recorded and monitored were frontal, central and occipital EEG, electrooculogram (EOG), submentalis EMG (chin), nasal and oral airflow, thoracic and abdominal wall motion, anterior tibialis EMG, snore microphone, electrocardiogram, and pulse oximetry. Continuous positive airway pressure (CPAP) was initiated at the beginning of the study and titrated to treat sleep-disordered breathing.  MEDICATIONS Medications self-administered by patient taken the night of the study : AMITRIPTYLINE, BUPROPION, MELATONIN, METOPROLOL  TECHNICIAN COMMENTS Comments added by technician: Patient had difficulty initiating sleep. Comments added by scorer: N/A  RESPIRATORY PARAMETERS Optimal PAP Pressure (cm): 10 AHI at Optimal Pressure (/hr): 0.0 Overall Minimal O2 (%): 88.0 Supine % at Optimal Pressure (%): 100 Minimal O2 at Optimal Pressure (%): 91.0   SLEEP ARCHITECTURE The study was initiated at 10:08:04 PM and ended at 4:31:56 AM.  Sleep onset time was 14.6 minutes and the sleep efficiency was 73.1%%. The total sleep time was 280.8 minutes.  The patient spent 1.8%% of the night in stage N1 sleep, 93.8%% in stage N2 sleep, 0.0%% in stage N3 and 4.5% in REM.Stage REM latency was 166.0 minutes  Wake after sleep onset was 88.5. Alpha intrusion was  absent. Supine sleep was 85.40%.  CARDIAC DATA The 2 lead EKG demonstrated sinus rhythm. The mean heart rate was 81.1 beats per minute. Other EKG findings include: None.  LEG MOVEMENT DATA The total Periodic Limb Movements of Sleep (PLMS) were 0. The PLMS index was 0.0. A PLMS index of <15 is considered normal in adults.  IMPRESSIONS - The optimal PAP pressure was 10 cm of water. - Central sleep apnea was not noted during this titration (CAI = 0.0/h). - Mild oxygen desaturations were observed during this titration (min O2 = 88.0%). Min sat on CPAP 10 was 91%. - No snoring was audible during this study. - No cardiac abnormalities were observed during this study. - Clinically significant periodic limb movements were not noted during this study. Arousals associated with PLMs were rare.  DIAGNOSIS - Obstructive Sleep Apnea (327.23 [G47.33 ICD-10])  RECOMMENDATIONS - Trial of CPAP therapy on 10 cm H2O or autopap 5-15. - Patient used a Medium size Resmed Full Face Mask AirFit F20 mask and heated humidification. - Be careful with alcohol, sedatives and other CNS depressants that may worsen sleep apnea and disrupt normal sleep architecture. - Sleep hygiene should be reviewed to assess factors that may improve sleep quality. - Weight management and regular exercise should be initiated or continued.  [Electronically signed] 09/24/2019 11:48 AM  Baird Lyons MD, ABSM Diplomate, American Board of Sleep Medicine   NPI: [id number]                           [place], [place] [facility]  ELECTRONICALLY SIGNED ON:  09/24/2019, 11:43 AM [place] PH: [phone]   FX: [phone]  [place] [facility]

## 2019-09-28 ENCOUNTER — Telehealth: Payer: Self-pay

## 2019-09-28 NOTE — Telephone Encounter (Signed)
pantoprazole (PROTONIX) 40 MG tablet(Expired), REFILL REQUEST @  [place], [place] - [place] [phone] (Phone) [phone] (Fax)

## 2019-09-29 NOTE — Telephone Encounter (Signed)
#  90 with 2 refills sent 06/20/2019. Left detailed VM at [facility] with this information and asked they get this ready for her. Requested return call for any questions. Hubbard Hartshorn, BSN, RN-BC

## 2019-09-29 NOTE — Telephone Encounter (Signed)
Thank you :)

## 2019-09-29 NOTE — Telephone Encounter (Signed)
Lisa at [facility] returned call. States they did not receive this Rx. VO for #90 with 1 refill given to [place]. Hubbard Hartshorn, BSN, RN-BC

## 2019-10-03 ENCOUNTER — Ambulatory Visit: Payer: No Typology Code available for payment source

## 2019-10-17 ENCOUNTER — Other Ambulatory Visit: Payer: Self-pay | Admitting: Internal Medicine

## 2019-11-14 ENCOUNTER — Other Ambulatory Visit: Payer: Self-pay | Admitting: Internal Medicine

## 2020-01-03 ENCOUNTER — Encounter: Payer: Self-pay | Admitting: Gastroenterology

## 2020-01-06 ENCOUNTER — Other Ambulatory Visit: Payer: Self-pay | Admitting: Internal Medicine

## 2020-01-09 ENCOUNTER — Telehealth: Payer: Self-pay | Admitting: Internal Medicine

## 2020-01-09 MED ORDER — LOSARTAN POTASSIUM 100 MG PO TABS: 100.0000 mg | ORAL_TABLET | Freq: Every day | ORAL | 5 refills | Status: DC

## 2020-01-09 MED ORDER — HYDROCHLOROTHIAZIDE 25 MG PO TABS: 25.0000 mg | ORAL_TABLET | Freq: Every day | ORAL | 5 refills | Status: DC

## 2020-01-09 NOTE — Telephone Encounter (Signed)
Refill Request   losartan-hydrochlorothiazide (HYZAAR) 100-25 MG tablet  cetirizine (ZYRTEC) 10 MG tablet  [place], [place] - [place]

## 2020-01-09 NOTE — Telephone Encounter (Signed)
Both RX's were sent today by [facility]. SChaplin, RN,BSN

## 2020-01-16 ENCOUNTER — Ambulatory Visit (HOSPITAL_COMMUNITY)
Admission: RE | Admit: 2020-01-16 | Discharge: 2020-01-16 | Disposition: A | Discharge status: Home / Self Care | Payer: Self-pay | Source: Ambulatory Visit | Attending: Internal Medicine | Admitting: Internal Medicine

## 2020-01-16 ENCOUNTER — Ambulatory Visit (INDEPENDENT_AMBULATORY_CARE_PROVIDER_SITE_OTHER): Payer: Self-pay | Admitting: Internal Medicine

## 2020-01-16 ENCOUNTER — Encounter: Payer: Self-pay | Admitting: Internal Medicine

## 2020-01-16 ENCOUNTER — Other Ambulatory Visit: Payer: Self-pay

## 2020-01-16 VITALS — BP 147/75 | HR 86 | Temp 98.3°F | Ht 67.0 in | Wt 255.4 lb

## 2020-01-16 DIAGNOSIS — F329 Major depressive disorder, single episode, unspecified: Secondary | ICD-10-CM

## 2020-01-16 DIAGNOSIS — Z720 Tobacco use: Secondary | ICD-10-CM

## 2020-01-16 DIAGNOSIS — K219 Gastro-esophageal reflux disease without esophagitis: Secondary | ICD-10-CM | POA: Insufficient documentation

## 2020-01-16 DIAGNOSIS — R682 Dry mouth, unspecified: Secondary | ICD-10-CM

## 2020-01-16 DIAGNOSIS — G8929 Other chronic pain: Secondary | ICD-10-CM

## 2020-01-16 DIAGNOSIS — Z23 Encounter for immunization: Secondary | ICD-10-CM

## 2020-01-16 DIAGNOSIS — Z79899 Other long term (current) drug therapy: Secondary | ICD-10-CM

## 2020-01-16 DIAGNOSIS — M25561 Pain in right knee: Secondary | ICD-10-CM

## 2020-01-16 DIAGNOSIS — I10 Essential (primary) hypertension: Secondary | ICD-10-CM

## 2020-01-16 MED ORDER — PANTOPRAZOLE SODIUM 40 MG PO TBEC: 40.0000 mg | DELAYED_RELEASE_TABLET | Freq: Every day | ORAL | 2 refills | Status: DC

## 2020-01-16 MED ORDER — CETIRIZINE HCL 10 MG PO TABS: ORAL_TABLET | ORAL | 3 refills | Status: DC

## 2020-01-16 MED ORDER — DICLOFENAC SODIUM 1 % EX GEL: 2.0000 g | Freq: Four times a day (QID) | CUTANEOUS | 2 refills | Status: DC

## 2020-01-16 MED ORDER — LOSARTAN POTASSIUM-HCTZ 100-25 MG PO TABS: 1.0000 | ORAL_TABLET | Freq: Every day | ORAL | 3 refills | Status: DC

## 2020-01-16 NOTE — Assessment & Plan Note (Signed)
Blood pressure above goal todaym but she took her medications 30 minutes prior to visit.  She also started smoking about 1 to 2 months ago.  She is not ready to quit at this time.  Educated patient on importance of taking medications when she wakes up. -Continue losartan-HCTZ 100-25 mg daily, refilled today  -Counseled on smoking cessation

## 2020-01-16 NOTE — Assessment & Plan Note (Signed)
Patient is a former smoker and reports starting to smoke again about 2 months ago due to recent stressors in her life (unexpected death of his sister, taking care of her nephews and nieces).  She has been smoking about 5 to 6 cigarettes/day to help with anxiety.  I discussed the risks of smoking, but patient is not quite ready to quit at this time.  Quit line information provided and advised patient to call us when she is ready to quit so we can assist her.

## 2020-01-16 NOTE — Assessment & Plan Note (Addendum)
Patient presents with chronic right knee pain that has been present for at least 4 months.  She describes the pain initially located anteriorly but is now both anterior and posterior. It is intermittent and mostly presents with activity but also at rest. She denies falls or trauma prior to pain onset, but has had several falls since pain onset with most recent one being 1 month ago.  She has tried Tylenol and menthol balm which help with the pain. 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 Low suspicion for septic joint in the absence of infectious symptoms and nl knee exam.   - R knee XR - Voltaren gel  - Tylenol or Ibuprofen PRN  - Strengthening exercises

## 2020-01-16 NOTE — Patient Instructions (Addendum)
Emma Bonilla,   For your knee pain I order an x-ray and Voltaren gel.  You can use the gel 4 times a day as needed for the pain.  For your dry mouth you can get artificial saliva over-the-counter.  If you start having difficulty swallowing or problems eating please give me a call and let me know as we may need to change your depression medications.  I refilled your blood pressure medications, allergy medication, and reflux medication.  Whenever you are ready to quit smoking give me a call let me know, we can assist you with this.  Make a follow-up appointment with me in 3 months.  - Dr. Frederico Hamman

## 2020-01-16 NOTE — Assessment & Plan Note (Signed)
Patient has been experiencing dry mouth for the past 2 months. Denies difficulty swallowing and eye dryness. I suspect this is an anticholinergic side effect from amitriptyline which she takes for depression.  However, I am hesitant to discontinue this medication as she had a recent episode of MDD a few months ago after the unexpected passing of her sister.  Will manage with artificial saliva at this time.  Explained to patient that if she starts having difficulty swallowing and/or decreased p.o. intake we will then need to switch to a different antidepressant.

## 2020-01-16 NOTE — Progress Notes (Signed)
   CC: Knee pain, dry mouth, HTN follow up   HPI:  Emma Bonilla is a 64 y.o. year-old female with PMH listed below who presents to clinic for R knee pain, dry mouth, and HTN follow up. Please see problem based assessment and plan for further details.   Past Medical History:  Diagnosis Date  . Arthritis   . Depression   . Diastolic dysfunction without heart failure   . GERD (gastroesophageal reflux disease)   . Hepatic steatosis    a. seen on prior US.  Marland Kitchen Hyperlipidemia   . Hypertension   . Mild aortic regurgitation   . Mild aortic stenosis   . Premature atrial contractions   . PVC's (premature ventricular contractions)   . Thyroid disease   . TIA (transient ischemic attack)    2010  . Tobacco abuse    Review of Systems:   Review of Systems  Constitutional: Negative for chills, fever, malaise/fatigue and weight loss.  HENT: Negative for sore throat.        Dry mouth   Respiratory: Negative for shortness of breath.   Cardiovascular: Negative for chest pain, palpitations and leg swelling.  Musculoskeletal: Positive for falls and joint pain. Negative for myalgias.  Neurological: Negative for dizziness and headaches.    Physical Exam:  Vitals:   01/16/20 1327  BP: (!) 147/75  Pulse: 86  Temp: 98.3 F (36.8 C)  TempSrc: Oral  SpO2: 97%  Weight: 255 lb 6.4 oz (115.8 kg)  Height: 5\' 7"  (1.702 m)    General: Well-appearing female in no acute distress Mouth: Mucous membranes are very dry, no oral lesions noted Cardiac: regular rate and rhythm, nl S1/S2, no murmurs, rubs or gallops  Pulm: CTAB, no wheezes or crackles, no increased work of breathing on room air  MSK: R knee appears grossly normal, no effusions noted, no signs of infection, full ROM, no pain with passive or active ROM Ext: warm and well perfused, no peripheral edema   Assessment & Plan:   See Encounters Tab for problem based charting.  Patient discussed with Dr. Lynnae January

## 2020-01-16 NOTE — Assessment & Plan Note (Signed)
Refilled protonix 40 mg QD.

## 2020-01-18 NOTE — Progress Notes (Signed)
Internal Medicine Clinic Attending  Case discussed with Dr. Santos at the time of the visit.  We reviewed the resident's history and exam and pertinent patient test results.  I agree with the assessment, diagnosis, and plan of care documented in the resident's note.    

## 2020-01-24 ENCOUNTER — Other Ambulatory Visit: Payer: Self-pay | Admitting: Internal Medicine

## 2020-01-24 ENCOUNTER — Telehealth: Payer: Self-pay

## 2020-01-24 DIAGNOSIS — F32A Depression, unspecified: Secondary | ICD-10-CM

## 2020-01-24 DIAGNOSIS — F329 Major depressive disorder, single episode, unspecified: Secondary | ICD-10-CM

## 2020-01-24 MED ORDER — BUPROPION HCL ER (SR) 150 MG PO TB12: ORAL_TABLET | ORAL | 4 refills | Status: DC

## 2020-01-24 NOTE — Telephone Encounter (Signed)
Please call pt back.

## 2020-01-24 NOTE — Telephone Encounter (Signed)
Called patient and discussed results. All questions answered.

## 2020-01-30 ENCOUNTER — Other Ambulatory Visit: Payer: Self-pay | Admitting: Internal Medicine

## 2020-02-01 ENCOUNTER — Other Ambulatory Visit: Payer: Self-pay

## 2020-02-01 ENCOUNTER — Ambulatory Visit: Payer: Self-pay

## 2020-02-08 ENCOUNTER — Encounter: Payer: Self-pay | Admitting: Physician Assistant

## 2020-02-08 ENCOUNTER — Telehealth (INDEPENDENT_AMBULATORY_CARE_PROVIDER_SITE_OTHER): Payer: Self-pay | Admitting: Physician Assistant

## 2020-02-08 VITALS — BP 137/85 | Ht 67.0 in | Wt 250.0 lb

## 2020-02-08 DIAGNOSIS — G459 Transient cerebral ischemic attack, unspecified: Secondary | ICD-10-CM

## 2020-02-08 DIAGNOSIS — I10 Essential (primary) hypertension: Secondary | ICD-10-CM

## 2020-02-08 DIAGNOSIS — I2510 Atherosclerotic heart disease of native coronary artery without angina pectoris: Secondary | ICD-10-CM

## 2020-02-08 DIAGNOSIS — Z9861 Coronary angioplasty status: Secondary | ICD-10-CM

## 2020-02-08 DIAGNOSIS — E782 Mixed hyperlipidemia: Secondary | ICD-10-CM

## 2020-02-08 NOTE — Progress Notes (Signed)
Virtual Visit via Telephone Note   This visit type was conducted due to national recommendations for restrictions regarding the COVID-19 Pandemic (e.g. social distancing) in an effort to limit this patient's exposure and mitigate transmission in our community.  Due to her co-morbid illnesses, this patient is at least at moderate risk for complications without adequate follow up.  This format is felt to be most appropriate for this patient at this time.  The patient did not have access to video technology/had technical difficulties with video requiring transitioning to audio format only (telephone).  All issues noted in this document were discussed and addressed.  No physical exam could be performed with this format.  Please refer to the patient's chart for her  consent to telehealth for [facility]   The patient was identified using 2 identifiers.  Date:  02/10/2020   ID:  Emma Bonilla, DOB 1956-09-29, MRN [id number]  Patient Location: Home Provider Location: Home  PCP:  Welford Roche, MD  Cardiologist:  No primary care provider on file.  Electrophysiologist:  None   Evaluation Performed:  Follow-Up Visit  Chief Complaint:  followup  History of Present Illness:    Emma Bonilla is a 64 y.o. female with PMH of CAD, HTN, HLD, GERD and history of TIA.  Patient had a history of PCI to RCA overlapping stents in November 2018.  Echocardiogram at the time showed mild aortic stenosis with moderate aortic insufficiency.  Most recent repeat echocardiogram obtained on 06/08/2019 showed EF >65%, tricuspid aortic valve, mild AI and mild aortic stenosis, mild dilatation of the ascending aorta measuring at 39 mm.  Since the last visit, she has underwent sleep study and a CPAP titration in October 2020.  Patient was contacted today via telephone.  She denies any exertional chest pain or shortness of breath.  Overall she is doing quite well from cardiology perspective.  Last lipid panel  obtained in 2020 showed very well-controlled cholesterol.  She does not have any lower extremity edema, orthopnea or PND.  She can follow-up with Dr. Debara Pickett in 6 to 71-month and to have a fasting lipid panel and a CMP prior to that.  She says after her CPAP titration in October, she has not received any further report from her PCP.  I spoke to our clinical coordinator, she recommended the patient discuss this with her PCP or we can refer to our sleep specialist Dr. Claiborne Billings for further management.  Since Dr. Annamaria Boots read the initial report, I will asked the patient to discuss with PCP and Dr. Annamaria Boots first and if needs to, we would be happy to set up appointment with Dr. Claiborne Billings.   The patient does not have symptoms concerning for COVID-19 infection (fever, chills, cough, or new shortness of breath).    Past Medical History:  Diagnosis Date  . Arthritis   . Depression   . Diastolic dysfunction without heart failure   . GERD (gastroesophageal reflux disease)   . Hepatic steatosis    a. seen on prior US.  Marland Kitchen Hyperlipidemia   . Hypertension   . Mild aortic regurgitation   . Mild aortic stenosis   . Premature atrial contractions   . PVC's (premature ventricular contractions)   . Thyroid disease   . TIA (transient ischemic attack)    2010  . Tobacco abuse    Past Surgical History:  Procedure Laterality Date  . CORONARY STENT INTERVENTION N/A 10/13/2017   Procedure: CORONARY STENT INTERVENTION;  Surgeon: Leonie Man,  MD;  Location: [place] [facility];  Service: Cardiovascular;  Laterality: N/A;  . DILATION AND CURETTAGE OF UTERUS     30 years ago  . LEFT HEART CATH AND CORONARY ANGIOGRAPHY N/A 10/13/2017   Procedure: LEFT HEART CATH AND CORONARY ANGIOGRAPHY;  Surgeon: Leonie Man, MD;  Location: [place] [facility];  Service: Cardiovascular;  Laterality: N/A;     Current Meds  Medication Sig  . amitriptyline (ELAVIL) 25 MG tablet TAKE 2 TABLETS BY MOUTH AT BEDTIME  . atorvastatin  (LIPITOR) 40 MG tablet TAKE 1 TABLET BY MOUTH ONCE DAILY AT 6 PM  . buPROPion (WELLBUTRIN SR) 150 MG 12 hr tablet TAKE 1 TABLET (150MG  TOTAL) BY MOUTH 2 TIMES A DAY  . calcium-vitamin D (OSCAL WITH D) 500-200 MG-UNIT per tablet Take 1 tablet by mouth daily.  . cetirizine (ZYRTEC) 10 MG tablet TAKE 1 TABLET (10MG  TOTAL) BY MOUTH DAILY  . clopidogrel (PLAVIX) 75 MG tablet Take 1 tablet by mouth once daily with breakfast  . diclofenac Sodium (VOLTAREN) 1 % GEL Apply 2 g topically 4 (four) times daily.  Marland Kitchen ezetimibe (ZETIA) 10 MG tablet Take 1 tablet (10 mg total) by mouth daily.  . ferrous sulfate 325 (65 FE) MG tablet Take 1 tablet (325 mg total) by mouth daily.  Marland Kitchen losartan-hydrochlorothiazide (HYZAAR) 100-25 MG tablet Take 1 tablet by mouth daily.  . Melatonin 3 MG TABS Take 3 mg by mouth at bedtime.  . metoprolol succinate (TOPROL-XL) 25 MG 24 hr tablet Take 1/2 (one-half) tablet by mouth once daily  . NASONEX 50 MCG/ACT nasal spray PLACE 2 SPRAYS INTO THE NOSE DAILY AS NEEDED.  Marland Kitchen pantoprazole (PROTONIX) 40 MG tablet Take 1 tablet (40 mg total) by mouth daily.  Marland Kitchen PAZEO 0.7 % SOLN PLACE 1 DROP TO EYE DAILY.  . SYNTHROID 100 MCG tablet TAKE 1 TABLET (100MCG TOTAL) BY MOUTH DAILY.     Allergies:   Naproxen sodium   Social History   Tobacco Use  . Smoking status: Light Tobacco Smoker    Packs/day: 0.50    Years: 45.00    Pack years: 22.50    Types: Cigarettes  . Smokeless tobacco: Never Used  . Tobacco comment: 5-6 cigs per day  Substance Use Topics  . Alcohol use: Yes    Comment: holidays and special occasion  . Drug use: Yes    Frequency: 1.0 times per week    Types: Marijuana    Comment: once weekly or once every 2 weeks     Family Hx: The patient's family history includes Cancer in her mother; Colon cancer in her sister; Crohn's disease in her father and sister; Diabetes in her brother; Emphysema in her mother; Heart disease in her father and sister; Heart failure in her mother;  Hyperlipidemia in her child, child, and sister; Hypertension in her child, father, mother, sister, sister, and sister; Kidney cancer in her sister; Skin cancer in her maternal grandmother; Stroke in her maternal grandfather; Valvular heart disease in her sister.  ROS:   Please see the history of present illness.     All other systems reviewed and are negative.   Prior CV studies:   The following studies were reviewed today:  Echo 06/08/2019 1. The left ventricle has hyperdynamic systolic function, with an  ejection fraction of >65%. The cavity size was normal. Left ventricular  diastolic Doppler parameters are consistent with impaired relaxation.  Indeterminate filling pressures No evidence  of left ventricular regional wall motion  abnormalities.  2. The right ventricle has normal systolic function. The cavity was  normal. There is no increase in right ventricular wall thickness. Right  ventricular systolic pressure could not be assessed.  3. The aortic valve is tricuspid. Mild thickening of the aortic valve.  Mild calcification of the aortic valve. Aortic valve regurgitation is mild  by color flow Doppler. Mild stenosis of the aortic valve.  4. The aortic root is normal in size and structure.  5. There is mild dilatation of the ascending aorta measuring 39 mm.  6. When compared to the prior study: 02/05/2017, gradients appear to have  increased mostly due to an increase in cardiac output. Consider anemia, thyrotoxicosis, infection, etc.  Labs/Other Tests and Data Reviewed:    EKG:  An ECG dated 04/16/2018 was personally reviewed today and demonstrated:  Normal sinus rhythm without significant ST-T wave changes, poor R wave progression in anterior leads.  Recent Labs: 06/20/2019: BUN 10; Creatinine, Ser 1.10; Potassium 3.7; Sodium 142   Recent Lipid Panel Lab Results  Component Value Date/Time   CHOL 114 06/20/2019 02:18 PM   TRIG 74 06/20/2019 02:18 PM   HDL 35 (L)  06/20/2019 02:18 PM   CHOLHDL 3.3 06/20/2019 02:18 PM   CHOLHDL 5.1 10/14/2017 02:45 AM   LDLCALC 64 06/20/2019 02:18 PM    Wt Readings from Last 3 Encounters:  02/08/20 250 lb (113.4 kg)  01/16/20 255 lb 6.4 oz (115.8 kg)  09/16/19 250 lb (113.4 kg)     Objective:    Vital Signs:  BP 137/85   Ht 5\' 7"  (1.702 m)   Wt 250 lb (113.4 kg)   LMP 02/11/2011 Comment: no chance of pregnancy per pt  BMI 39.16 kg/m    VITAL SIGNS:  reviewed  ASSESSMENT & PLAN:    1. CAD: Denies any recent chest pain or exertional shortness of breath.  2. Hypertension: Blood pressure well controlled  3. Hyperlipidemia: On Lipitor.  Obtain Complete metabolic panel and a fasting lipid panel  4. History of TIA: No recent recurrence  COVID-19 Education: The signs and symptoms of COVID-19 were discussed with the patient and how to seek care for testing (follow up with PCP or arrange E-visit).  The importance of social distancing was discussed today.  Time:   Today, I have spent 12 minutes with the patient with telehealth technology discussing the above problems.     Medication Adjustments/Labs and Tests Ordered: Current medicines are reviewed at length with the patient today.  Concerns regarding medicines are outlined above.   Tests Ordered: Orders Placed This Encounter  Procedures  . Comprehensive metabolic panel  . Lipid panel    Medication Changes: No orders of the defined types were placed in this encounter.   Follow Up:  Either In Person or Virtual in [address]  Signed, Almyra Deforest, [place]  02/10/2020 11:17 PM    [facility]

## 2020-02-08 NOTE — Patient Instructions (Signed)
Medication Instructions:  Your physician recommends that you continue on your current medications as directed. Please refer to the Current Medication list given to you today.  *If you need a refill on your cardiac medications before your next appointment, please call your pharmacy*  Lab Work: You will need to have labs (blood work) drawn prior to your next appointment with Dr. Debara Pickett in 6-9 months:  FASTING LIPID PANEL-DO NOT EAT OR DRINK PAST MIDNIGHT. ([place])  CMET If you have labs (blood work) drawn today and your tests are completely normal, you will receive your results only by: Marland Kitchen MyChart Message (if you have MyChart) OR . A paper copy in the mail If you have any lab test that is abnormal or we need to change your treatment, we will call you to review the results.   Testing/Procedures: NONE ordered at this time of appointment   Follow-Up: At [facility], you and your health needs are our priority.  As part of our continuing mission to provide you with exceptional heart care, we have created designated Provider Care Teams.  These Care Teams include your primary Cardiologist (physician) and Advanced Practice Providers (APPs -  Physician Assistants and Nurse Practitioners) who all work together to provide you with the care you need, when you need it.  We recommend signing up for the patient portal called "MyChart".  Sign up information is provided on this After Visit Summary.  MyChart is used to connect with patients for Virtual Visits (Telemedicine).  Patients are able to view lab/test results, encounter notes, upcoming appointments, etc.  Non-urgent messages can be sent to your provider as well.   To learn more about what you can do with MyChart, go to [email or web address].    Your next appointment:   6-9 month(s)  The format for your next appointment:   In Person  Provider:   K. Mali Hilty, MD  Other Instructions

## 2020-03-15 ENCOUNTER — Other Ambulatory Visit: Payer: Self-pay | Admitting: Internal Medicine

## 2020-03-27 ENCOUNTER — Other Ambulatory Visit: Payer: Self-pay | Admitting: Internal Medicine

## 2020-03-27 ENCOUNTER — Encounter: Payer: Self-pay | Admitting: *Deleted

## 2020-03-28 ENCOUNTER — Other Ambulatory Visit: Payer: Self-pay | Admitting: Internal Medicine

## 2020-03-30 ENCOUNTER — Other Ambulatory Visit: Payer: Self-pay | Admitting: Internal Medicine

## 2020-03-30 NOTE — Telephone Encounter (Signed)
  *  STAT* If patient is at the pharmacy, call can be transferred to refill team.   1. Which medications need to be refilled? (please list name of each medication and dose if known)   metoprolol succinate (TOPROL-XL) 25 MG 24 hr tablet     2. Which pharmacy/location (including street and city if local pharmacy) is medication to be sent to? [place], [address]  3. Do they need a 30 day or 90 day supply? 30 days

## 2020-04-03 MED ORDER — METOPROLOL SUCCINATE ER 25 MG PO TB24: ORAL_TABLET | ORAL | 6 refills | Status: DC

## 2020-04-03 NOTE — Telephone Encounter (Signed)
Follow up  Pt is calling back to follow up her refill request, she said she is completely out of this med

## 2020-04-13 ENCOUNTER — Telehealth: Payer: Self-pay | Admitting: Internal Medicine

## 2020-04-13 NOTE — Telephone Encounter (Signed)
Pls contact pt regarding her medicine, [phone]

## 2020-04-13 NOTE — Telephone Encounter (Signed)
Returned call to patient. States she needs refills on losartan-HCTZ and pantoprazole at [facility]. These were both sent on 01/16/2020 for 90 day supply with refills. Call placed to [facility]. No answer. This information was left on their secure physician line with request for return call for any questions. Hubbard Hartshorn, BSN, RN-BC

## 2020-04-23 ENCOUNTER — Other Ambulatory Visit: Payer: Self-pay

## 2020-04-23 ENCOUNTER — Other Ambulatory Visit: Payer: Self-pay | Admitting: Internal Medicine

## 2020-04-23 ENCOUNTER — Ambulatory Visit: Payer: Self-pay | Admitting: Internal Medicine

## 2020-04-23 ENCOUNTER — Encounter: Payer: Self-pay | Admitting: Internal Medicine

## 2020-04-23 VITALS — BP 122/74 | HR 84 | Temp 98.4°F | Ht 67.0 in | Wt 250.4 lb

## 2020-04-23 DIAGNOSIS — R7303 Prediabetes: Secondary | ICD-10-CM

## 2020-04-23 DIAGNOSIS — I10 Essential (primary) hypertension: Secondary | ICD-10-CM

## 2020-04-23 DIAGNOSIS — E039 Hypothyroidism, unspecified: Secondary | ICD-10-CM

## 2020-04-23 DIAGNOSIS — G4733 Obstructive sleep apnea (adult) (pediatric): Secondary | ICD-10-CM

## 2020-04-23 DIAGNOSIS — R202 Paresthesia of skin: Secondary | ICD-10-CM

## 2020-04-23 DIAGNOSIS — R200 Anesthesia of skin: Secondary | ICD-10-CM

## 2020-04-23 LAB — POCT GLYCOSYLATED HEMOGLOBIN (HGB A1C): Hemoglobin A1C: 6.7 % — AB (ref 4.0–5.6)

## 2020-04-23 LAB — GLUCOSE, CAPILLARY: Glucose-Capillary: 98 mg/dL (ref 70–99)

## 2020-04-23 MED ORDER — EZETIMIBE 10 MG PO TABS: 10.0000 mg | ORAL_TABLET | Freq: Every day | ORAL | 3 refills | Status: DC

## 2020-04-23 MED ORDER — AMITRIPTYLINE HCL 25 MG PO TABS: 50.0000 mg | ORAL_TABLET | Freq: Every day | ORAL | 7 refills | Status: DC

## 2020-04-23 MED ORDER — ATORVASTATIN CALCIUM 40 MG PO TABS: ORAL_TABLET | ORAL | 3 refills | Status: DC

## 2020-04-23 MED ORDER — METFORMIN HCL 500 MG PO TABS: 500.0000 mg | ORAL_TABLET | Freq: Two times a day (BID) | ORAL | 11 refills | Status: DC

## 2020-04-23 NOTE — Patient Instructions (Addendum)
[name],   We will look into what we need to do to get you a CPAP machine.  Because your study was more than 6 months ago we may need to repeat it, but again we will look into this and let you know once we know.  I ordered a nerve conduction study on your right hand to check for carpal tunnel.  In the meantime I would recommend you use your wrist splint with metal rods in the side to keep the wrist straight.  We are also checking your thyroid today as well as checking you for diabetes. Will call you with the results.   I refilled all your medications and sent them to the appropriate pharmacies.  Please give [place] a call if you are having difficulty with refills.  Come back to see [place] in 6 months to a year or sooner if you need to.  -Dr. Frederico Hamman

## 2020-04-23 NOTE — Assessment & Plan Note (Signed)
Blood pressure remains well controlled on the combination pill losartan-HCTZ 100-25 mg daily.  Refill provided recently.  We will continue current regimen.

## 2020-04-23 NOTE — Assessment & Plan Note (Signed)
Patient is requesting results of sleep study that was performed in 07/2019.  I reviewed results which show moderate sleep apnea.  She continues to complain of loud snoring, fatigue, and headaches.  Her husband also tells her that he has noticed she stops breathing intermittently while she sleeps.    - DME CPAP order placed  - May need to repeat sleep study since it was performed more than 6 months ago.  We will look into this

## 2020-04-23 NOTE — Assessment & Plan Note (Signed)
Patient is requesting an A1c check.  She had a birthday recently and states most of her family members have been diagnosed with diabetes around the same age and is concerned she may have it as well.  Last A1c was checked in 06/2019 and it was 6.0.  We discussed this is in the prediabetes range.  We discussed lifestyle changes such as reducing carbohydrate intake and exercise/weight loss as well as medication management with Metformin. She will start working on lifestyle changes, but is not sure she would like to start medication at this time.  We will check A1c today and discuss with patient after results.

## 2020-04-23 NOTE — Assessment & Plan Note (Signed)
Patient presents complaining of intermittent, bilateral numbness and tingling in both hands that is worse on the right.  She does have a history of left-sided carpal tunnel and states her symptoms are similar.  She has not noted any particular activity that exacerbates her symptoms.  Shaking her hand does help with the tingling.  She is using a soft wrist brace that has not been helping.  Suspect she has bilateral carpal tunnel which is not uncommon in patients with hypothyroidism.  Recommended wearing a wrist splint to keep wrist straight instead of soft brace.   - Wrist splint  - Nerve conduction studies

## 2020-04-23 NOTE — Progress Notes (Signed)
   CC: Follow up on sleep study, R hand tingling, and HTN   HPI:  [name] is a 64 y.o. year-old female with PMH listed below who presents to clinic for Follow up on sleep study, R hand tingling, and HTN . Please see problem based assessment and plan for further details.   Past Medical History:  Diagnosis Date  . Arthritis   . Depression   . Diastolic dysfunction without heart failure   . GERD (gastroesophageal reflux disease)   . Hepatic steatosis    a. seen on prior US.  Marland Kitchen Hyperlipidemia   . Hypertension   . Mild aortic regurgitation   . Mild aortic stenosis   . Premature atrial contractions   . PVC's (premature ventricular contractions)   . Thyroid disease   . TIA (transient ischemic attack)    2010  . Tobacco abuse    Review of Systems:   Review of Systems  Constitutional: Positive for malaise/fatigue. Negative for chills, fever and weight loss.  Gastrointestinal: Negative for abdominal pain and constipation.  Neurological: Positive for tingling (R hand> L hand) and headaches. Negative for sensory change and weakness.    Physical Exam:  Vitals:   04/23/20 1417  BP: 122/74  Pulse: 84  Temp: 98.4 F (36.9 C)  TempSrc: Oral  SpO2: 100%  Weight: 250 lb 6.4 oz (113.6 kg)  Height: 5\' 7"  (1.702 m)    General: Well-appearing female in no acute distress Pulm: CTAB, no wheezes or crackles, no increased work of breathing on room air  Neuro: A&Ox3, sensation intact in all four extremities, no motor deficits on bilateral UE, no thenar atrophy noted  Ext: warm and well perfused, no peripheral edema   Assessment & Plan:   See Encounters Tab for problem based charting.  Patient discussed with Dr. Lynnae January

## 2020-04-23 NOTE — Assessment & Plan Note (Signed)
Patient has a long history of well-controlled hypothyroidism.  She is on Synthroid 100 MCG daily.  Last TSH check was on 12/2018 and it was normal.  We will check today.

## 2020-04-24 LAB — TSH: TSH: 1.46 u[IU]/mL (ref 0.450–4.500)

## 2020-04-24 NOTE — Progress Notes (Signed)
Internal Medicine Clinic Attending  Case discussed with Dr. Santos-Sanchez at the time of the visit.  We reviewed the resident's history and exam and pertinent patient test results.  I agree with the assessment, diagnosis, and plan of care documented in the resident's note.    

## 2020-06-19 ENCOUNTER — Telehealth: Payer: Self-pay | Admitting: Internal Medicine

## 2020-06-19 MED ORDER — EZETIMIBE 10 MG PO TABS: 10.0000 mg | ORAL_TABLET | Freq: Every day | ORAL | 3 refills | Status: DC

## 2020-06-19 NOTE — Telephone Encounter (Signed)
Received fax from [facility] Dept that zetia 10mg  is no longer free for patient. They suggested alternatives, which were statins, which patient already takes. Called patient who plans to continue taking zetia and is OK with paying for medication. She will check with health dept on cost, as she is able to purchase some prescriptions from there for $6/month. Advised that good Rx has med available with coupon for around $7 at [employer]. Asked her to call back if we need to change her pharmacy

## 2020-06-19 NOTE — Telephone Encounter (Signed)
Patient is calling to follow up. She states she would prefer to have medication sent to [name], [place].

## 2020-06-19 NOTE — Telephone Encounter (Signed)
RX sent to pharmacy requested. Thanks!

## 2020-06-19 NOTE — Addendum Note (Signed)
Addended by: Caprice Beaver T on: 06/19/2020 03:41 PM   Modules accepted: Orders

## 2020-06-28 ENCOUNTER — Other Ambulatory Visit: Payer: Self-pay | Admitting: *Deleted

## 2020-07-24 ENCOUNTER — Other Ambulatory Visit: Payer: Self-pay | Admitting: Internal Medicine

## 2020-07-26 ENCOUNTER — Other Ambulatory Visit: Payer: Self-pay | Admitting: Obstetrics and Gynecology

## 2020-07-26 DIAGNOSIS — Z1231 Encounter for screening mammogram for malignant neoplasm of breast: Secondary | ICD-10-CM

## 2020-07-30 ENCOUNTER — Other Ambulatory Visit: Payer: Self-pay | Admitting: Student

## 2020-07-30 DIAGNOSIS — F32A Depression, unspecified: Secondary | ICD-10-CM

## 2020-07-30 MED ORDER — BUPROPION HCL ER (SR) 150 MG PO TB12: ORAL_TABLET | ORAL | 5 refills | Status: DC

## 2020-07-30 NOTE — Telephone Encounter (Signed)
Refill request  buPROPion (WELLBUTRIN SR) 150 MG 12 hr tablet  Pharmacy  [place], [place] [facility] [address]

## 2020-08-15 ENCOUNTER — Other Ambulatory Visit: Payer: Self-pay | Admitting: *Deleted

## 2020-08-15 MED ORDER — LEVOTHYROXINE SODIUM 100 MCG PO TABS
ORAL_TABLET | ORAL | 3 refills | Status: DC
Start: 2020-08-15 — End: 2021-06-12

## 2020-08-30 ENCOUNTER — Ambulatory Visit: Payer: Self-pay

## 2020-09-12 ENCOUNTER — Ambulatory Visit: Payer: Self-pay

## 2020-10-12 LAB — LIPID PANEL
Chol/HDL Ratio: 3.1 ratio (ref 0.0–4.4)
Cholesterol, Total: 109 mg/dL (ref 100–199)
HDL: 35 mg/dL — ABNORMAL LOW (ref >39)
LDL Chol Calc (NIH): 60 mg/dL (ref 0–99)
Triglycerides: 61 mg/dL (ref 0–149)
VLDL Cholesterol Cal: 14 mg/dL (ref 5–40)

## 2020-10-12 LAB — COMPREHENSIVE METABOLIC PANEL
ALT: 20 IU/L (ref 0–32)
AST: 19 IU/L (ref 0–40)
Albumin/Globulin Ratio: 1.6 (ref 1.2–2.2)
Albumin: 4.1 g/dL (ref 3.8–4.8)
Alkaline Phosphatase: 134 IU/L — ABNORMAL HIGH (ref 44–121)
BUN/Creatinine Ratio: 12 (ref 12–28)
BUN: 13 mg/dL (ref 8–27)
Bilirubin Total: <0.2 mg/dL (ref 0.0–1.2)
CO2: 25 mmol/L (ref 20–29)
Calcium: 9.3 mg/dL (ref 8.7–10.3)
Chloride: 104 mmol/L (ref 96–106)
Creatinine, Ser: 1.11 mg/dL — ABNORMAL HIGH (ref 0.57–1.00)
GFR calc Af Amer: 61 mL/min/{1.73_m2} (ref >59)
GFR calc non Af Amer: 53 mL/min/{1.73_m2} — ABNORMAL LOW (ref >59)
Globulin, Total: 2.5 g/dL (ref 1.5–4.5)
Glucose: 96 mg/dL (ref 65–99)
Potassium: 4.3 mmol/L (ref 3.5–5.2)
Sodium: 144 mmol/L (ref 134–144)
Total Protein: 6.6 g/dL (ref 6.0–8.5)

## 2020-10-22 IMAGING — MG DIGITAL SCREENING BILATERAL MAMMOGRAM WITH TOMO AND CAD
8 series · 8 of 24 positions shown · non-contrast
Comparison: Previous exam(s).

CLINICAL DATA: Screening.

EXAM:
DIGITAL SCREENING BILATERAL MAMMOGRAM WITH TOMO AND CAD

[R MLO synth-2D]
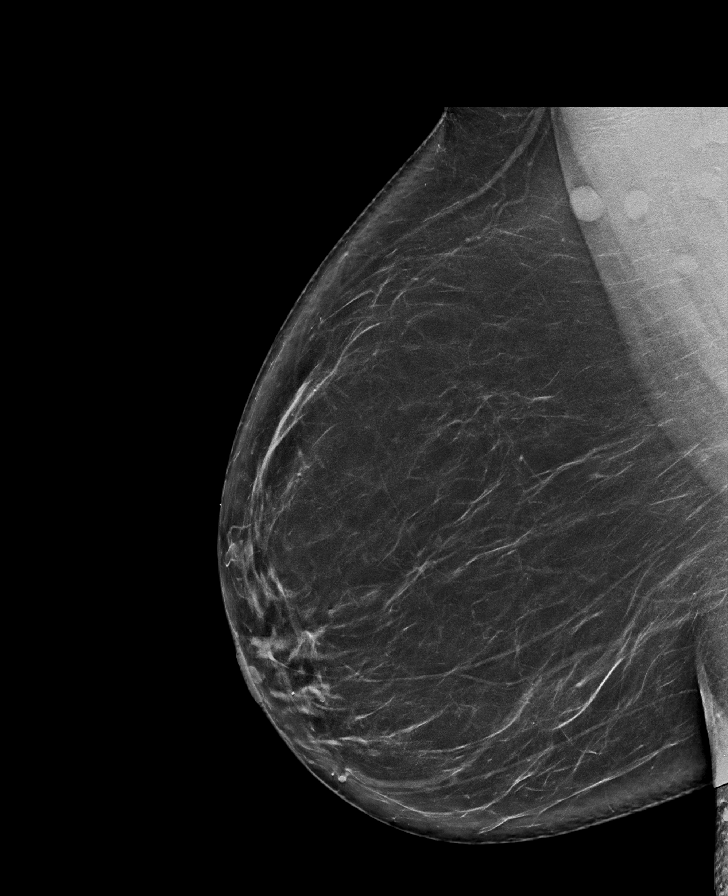

[L CC synth-2D]
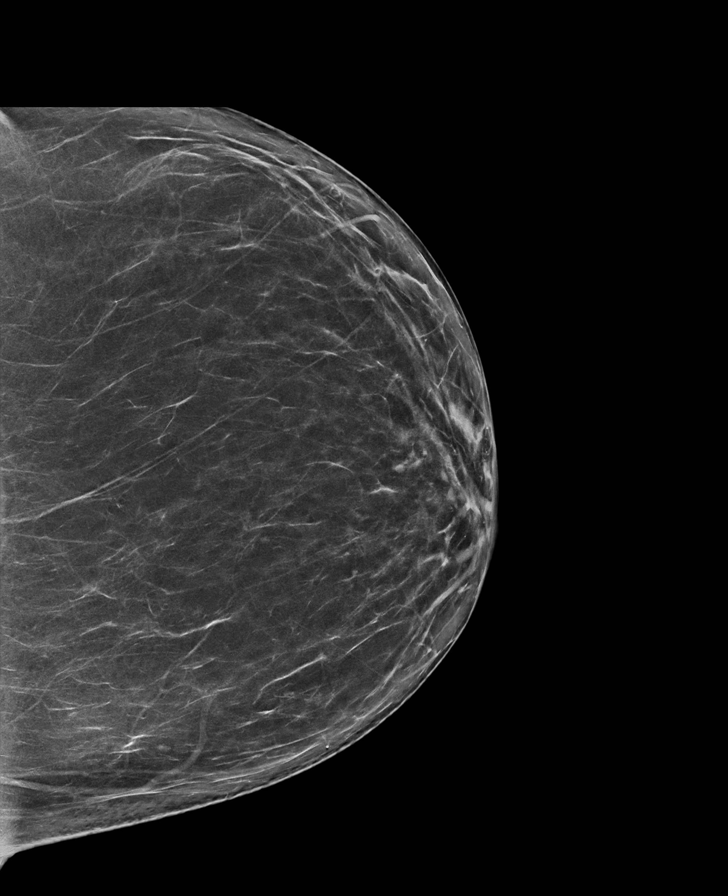

[L MLO synth-2D]
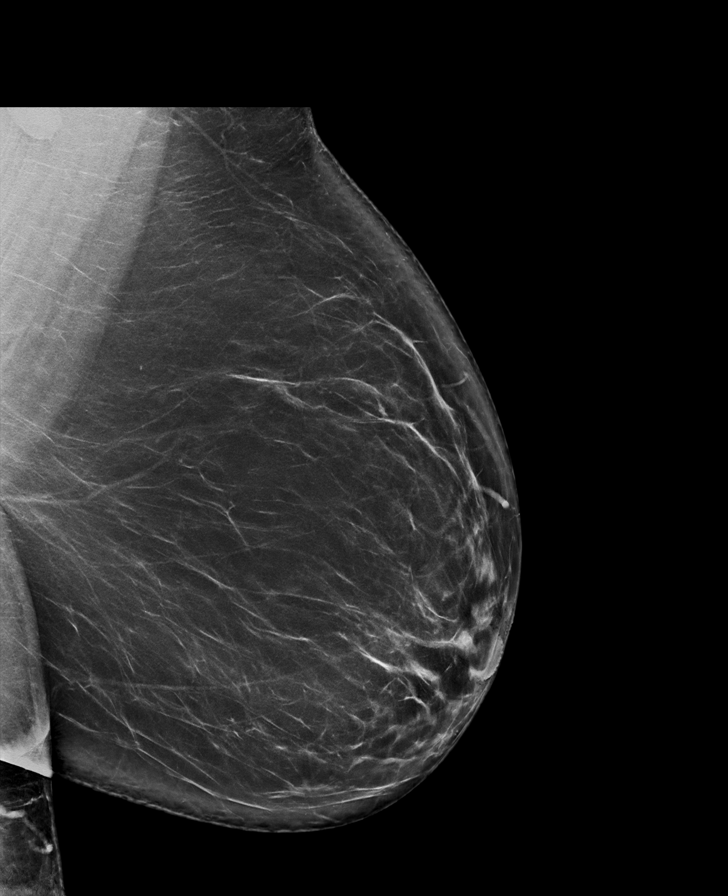

[R CC synth-2D]
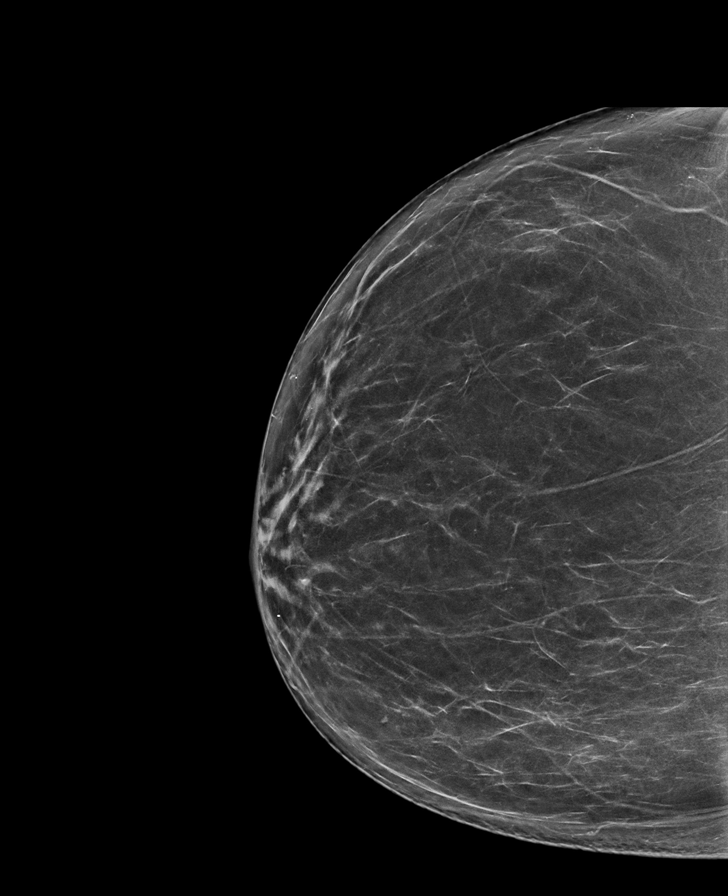

[R MLO tomo · tomo slice 47/94.0]
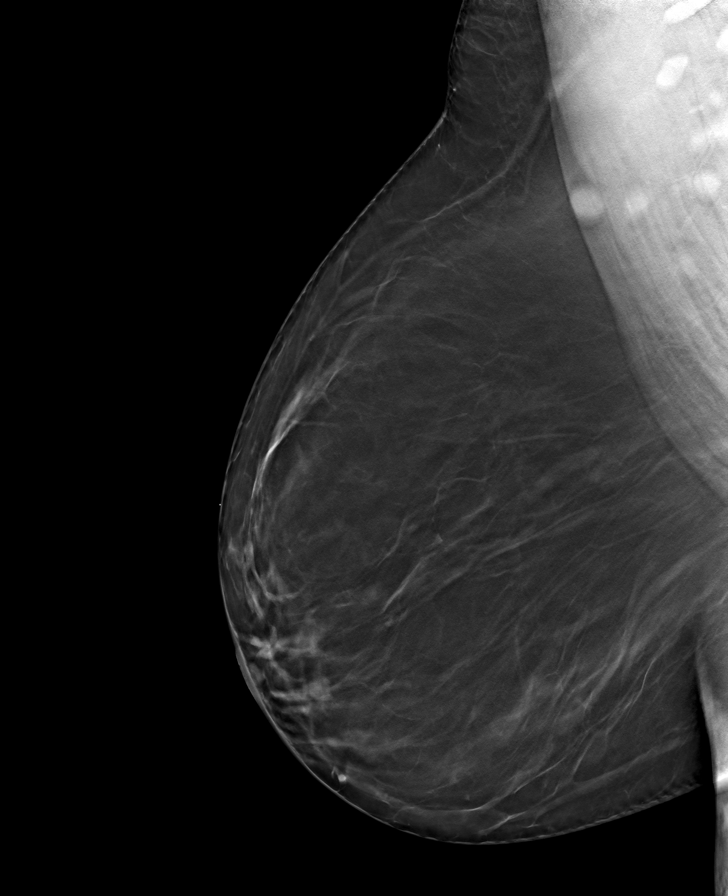

[L CC tomo · tomo slice 41/80.0]
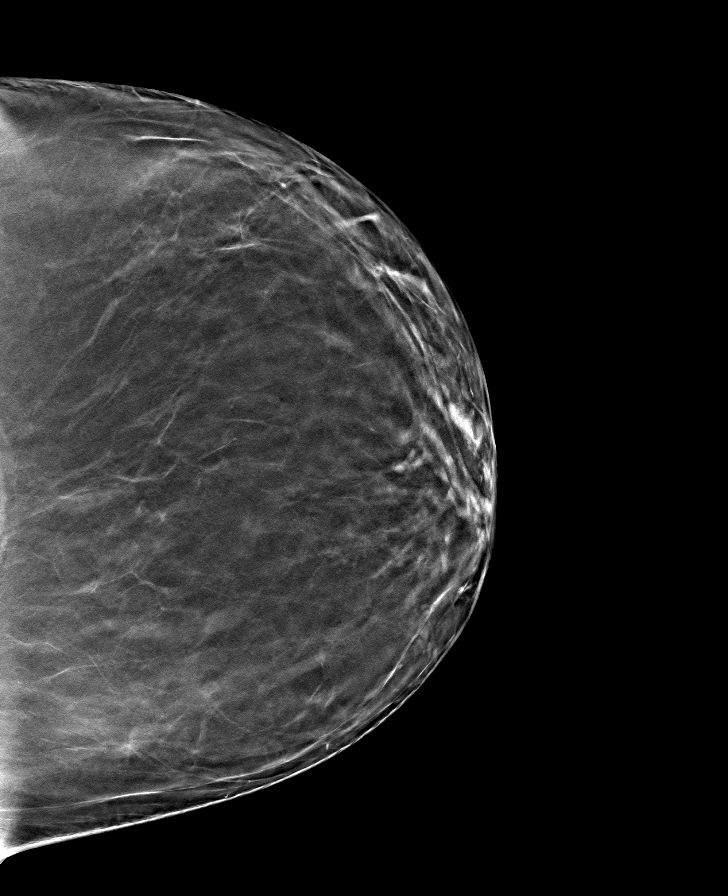

[L MLO tomo · tomo slice 50/99.0]
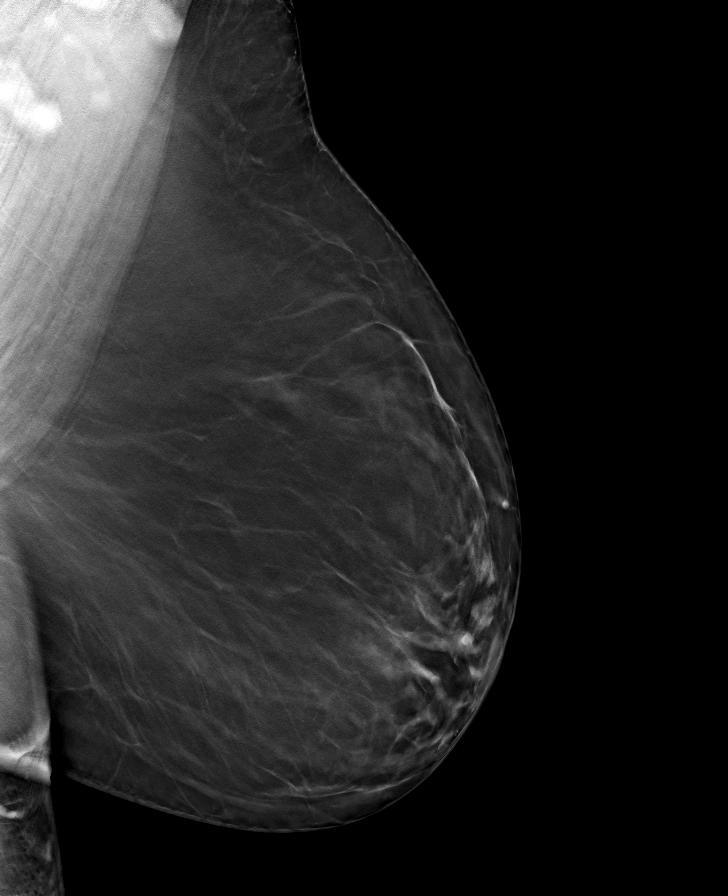

[R CC tomo · tomo slice 41/80.0]
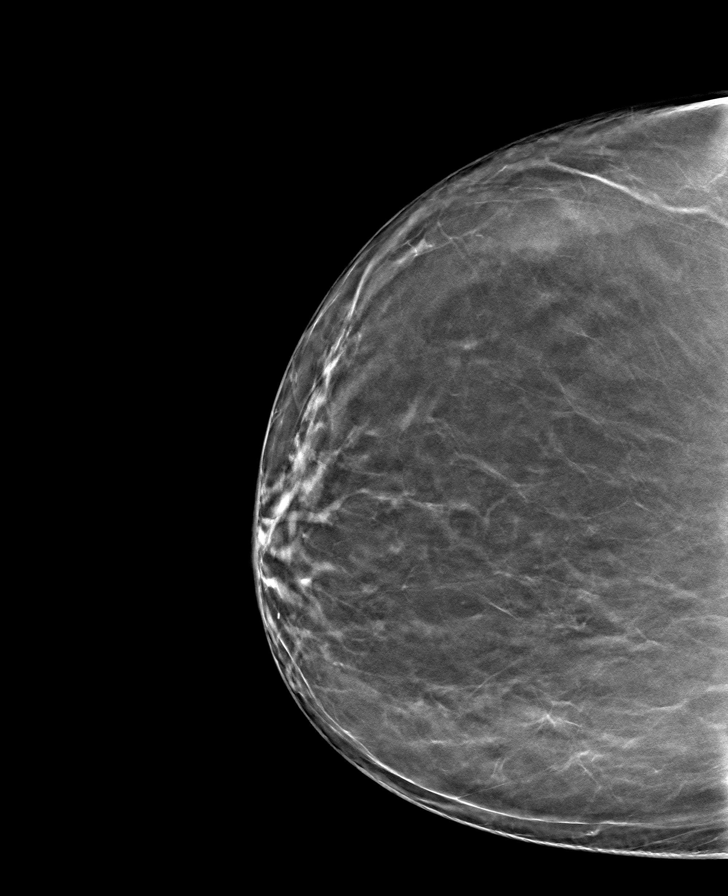

[8 of 24 positions shown; findings below may reference images not displayed]

ACR Breast Density Category b: There are scattered areas of
fibroglandular density.
FINDINGS: There are no findings suspicious for malignancy. Images were
processed with CAD.
IMPRESSION: No mammographic evidence of malignancy. A result letter of this
screening mammogram will be mailed directly to the patient.

RECOMMENDATION:
Screening mammogram in one year. (Code:[id number])

BI-RADS CATEGORY  1: Negative.

## 2020-11-28 ENCOUNTER — Other Ambulatory Visit: Payer: Self-pay | Admitting: Internal Medicine

## 2020-11-28 DIAGNOSIS — K219 Gastro-esophageal reflux disease without esophagitis: Secondary | ICD-10-CM

## 2020-11-28 MED ORDER — OMEPRAZOLE 40 MG PO CPDR: 40.0000 mg | DELAYED_RELEASE_CAPSULE | Freq: Every day | ORAL | 3 refills | Status: DC

## 2020-11-28 NOTE — Progress Notes (Signed)
Received letter from [facility] pharmacy regarding pantoprazole being on backorder. Will send script for omeprazole

## 2021-01-28 ENCOUNTER — Other Ambulatory Visit: Payer: Self-pay | Admitting: *Deleted

## 2021-01-28 MED ORDER — LOSARTAN POTASSIUM-HCTZ 100-25 MG PO TABS
1.0000 | ORAL_TABLET | Freq: Every day | ORAL | 3 refills | Status: DC
Start: 2021-01-28 — End: 2021-06-12

## 2021-01-28 NOTE — Telephone Encounter (Signed)
Next appt scheduled 03/01/21 with PCP.

## 2021-01-31 ENCOUNTER — Other Ambulatory Visit: Payer: Self-pay | Admitting: *Deleted

## 2021-01-31 MED ORDER — CETIRIZINE HCL 10 MG PO TABS
ORAL_TABLET | ORAL | 3 refills | Status: DC
Start: 2021-01-31 — End: 2021-06-12

## 2021-02-28 ENCOUNTER — Telehealth: Payer: Self-pay | Admitting: *Deleted

## 2021-03-01 ENCOUNTER — Ambulatory Visit (INDEPENDENT_AMBULATORY_CARE_PROVIDER_SITE_OTHER): Payer: 59 | Admitting: Student

## 2021-03-01 ENCOUNTER — Other Ambulatory Visit: Payer: Self-pay

## 2021-03-01 ENCOUNTER — Encounter: Payer: Self-pay | Admitting: Student

## 2021-03-01 VITALS — BP 126/80 | HR 81 | Temp 98.2°F | Ht 67.0 in | Wt 239.3 lb

## 2021-03-01 DIAGNOSIS — R7303 Prediabetes: Secondary | ICD-10-CM

## 2021-03-01 DIAGNOSIS — K219 Gastro-esophageal reflux disease without esophagitis: Secondary | ICD-10-CM

## 2021-03-01 DIAGNOSIS — F32A Depression, unspecified: Secondary | ICD-10-CM | POA: Diagnosis not present

## 2021-03-01 DIAGNOSIS — E7800 Pure hypercholesterolemia, unspecified: Secondary | ICD-10-CM

## 2021-03-01 DIAGNOSIS — I10 Essential (primary) hypertension: Secondary | ICD-10-CM

## 2021-03-01 DIAGNOSIS — E611 Iron deficiency: Secondary | ICD-10-CM | POA: Diagnosis not present

## 2021-03-01 DIAGNOSIS — G4733 Obstructive sleep apnea (adult) (pediatric): Secondary | ICD-10-CM

## 2021-03-01 DIAGNOSIS — D500 Iron deficiency anemia secondary to blood loss (chronic): Secondary | ICD-10-CM

## 2021-03-01 DIAGNOSIS — E039 Hypothyroidism, unspecified: Secondary | ICD-10-CM

## 2021-03-01 DIAGNOSIS — E782 Mixed hyperlipidemia: Secondary | ICD-10-CM

## 2021-03-01 DIAGNOSIS — S83003A Unspecified subluxation of unspecified patella, initial encounter: Secondary | ICD-10-CM

## 2021-03-01 DIAGNOSIS — R200 Anesthesia of skin: Secondary | ICD-10-CM

## 2021-03-01 DIAGNOSIS — I350 Nonrheumatic aortic (valve) stenosis: Secondary | ICD-10-CM

## 2021-03-01 DIAGNOSIS — R202 Paresthesia of skin: Secondary | ICD-10-CM

## 2021-03-01 LAB — POCT GLYCOSYLATED HEMOGLOBIN (HGB A1C): Hemoglobin A1C: 6.1 % — AB (ref 4.0–5.6)

## 2021-03-01 LAB — GLUCOSE, CAPILLARY: Glucose-Capillary: 105 mg/dL — ABNORMAL HIGH (ref 70–99)

## 2021-03-01 MED ORDER — BUPROPION HCL ER (SR) 150 MG PO TB12: ORAL_TABLET | ORAL | 5 refills | Status: DC

## 2021-03-01 MED ORDER — OMEPRAZOLE 40 MG PO CPDR: 40.0000 mg | DELAYED_RELEASE_CAPSULE | Freq: Two times a day (BID) | ORAL | 3 refills | Status: DC

## 2021-03-01 NOTE — Patient Instructions (Addendum)
Emma Bonilla,  Your hemoglobin A1c is consistent with pre-diabetes. With weight loss and good nutrition (lower simple sugars) hopefully we can bring this down to within normal ranges! I will send a referral for you to see Debera Lat, our diabetic educator. You will receive a call to schedule an appointment.   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 This may be causing your knees to give out on you. I have included exercises for you below. You will need to work on quadriceps (thigh muscle) strengthening to improve your knee symptoms. I would also try wearing your wrist splints every night to improve your carpal tunnel syndrome.   I will order you a sleep study for assessment of your sleep apnea as well as a CPAP (mask) to wear at night.   I have also ordered an ECHO (heart ultrasound) to assess your heart function and valve function. We will check blood work including cholesterol and your iron to see if we need to make any changes with your medications. For now, you can increase your omeprazole to twice daily to decrease your heartburn overnight.   I will call you with results. Please follow up in 3 months or sooner if you have any concerns.   Great to meet you,  Dr. Konrad Penta    Patellar Dislocation and Subluxation The kneecap (patella) is located in a groove at the end of the thigh bone (femur). Patellar dislocation and patellar subluxation are injuries that happen when the patella slips out of its normal position.  In a patellar subluxation, the patella slips partly out of the groove.  In a patellar dislocation, the patella slips all the way out of the groove. What are the causes? This condition may be caused by:  A hit to the knee.  Twisting the knee when the foot is planted. What increases the risk? You are more likely to develop this condition if you:  Are an athlete in your teens or 59s.  Have had this condition before.  Play certain kinds of sports, including sports  that: ? Include quick turns, quick changes in direction, or contact with other players, like soccer. ? Require jumping, such as basketball or volleyball. ? Require that cleats are worn. What are the signs or symptoms? Symptoms of this condition include:  A feeling that the knee is buckling, followed by sudden extreme pain in the knee.  A deformed knee.  A popping sensation, followed by a feeling that something is out of place.  Inability to bend or straighten the knee.  Swelling in the knee. How is this diagnosed? This condition may be diagnosed with:  A physical exam.  An X-ray. This may be done to see the position of the patella or to see if a bone has broken.  An MRI. This may be done to look at the alignment of your knee and the ligaments that hold your patella in place. How is this treated? Your patella may move back into place on its own when you straighten your knee. If your patella does not move back into place on its own, your health care provider will move it back into place. After your patella is back in its normal position, you may be able to go back to your normal activities, or you may be treated further by:  Wearing a knee brace to keep your knee from moving (immobilized) while it heals.  Doing exercises that help improve strength and movement in your knee.  Taking medicine to help  with pain and inflammation.  Applying ice to the knee to help with pain and inflammation.  Having surgery to prevent the patella from slipping out of place or to clean out any loose cartilage in your joint. This may be needed if other treatments do not help or if the condition keeps happening. Follow these instructions at home: If you have a brace:  Wear the brace as told by your health care provider. Remove it only as told by your health care provider.  Loosen the brace if your toes tingle, become numb, or turn cold and blue.  Keep the brace clean.  If the brace is not  waterproof: ? Do not let it get wet. ? Cover it with a watertight covering when you take a bath or shower. Managing pain, stiffness, and swelling  If directed, put ice on the affected area. ? If you have a removable brace, remove it as told by your health care provider. ? Put ice in a plastic bag. ? Place a towel between your skin and the bag. ? Leave the ice on for 20 minutes, 2-3 times a day.  Move your toes often to reduce stiffness and swelling.  Raise (elevate) the injured area above the level of your heart while you are sitting or lying down.   Activity  Do not use the injured limb to support your body weight until your health care provider says that you can. Use crutches as told by your health care provider.  Return to your normal activities as told by your health care provider. Ask your health care provider what activities are safe for you.  Do exercises as told by your health care provider. General instructions  Take over-the-counter and prescription medicines only as told by your health care provider.  Do not use any products that contain nicotine or tobacco, such as cigarettes, e-cigarettes, and chewing tobacco. These can delay healing. If you need help quitting, ask your health care provider.  Keep all follow-up visits as told by your health care provider. This is important. How is this prevented?  Warm up and stretch before being active.  Cool down and stretch after being active.  Give your body time to rest between periods of activity.  Make sure to use equipment that fits you.  Be safe and responsible while being active. This will help you avoid falls.  Do at least 150 minutes of moderate-intensity exercise each week, such as brisk walking or water aerobics.  Maintain physical fitness, including: ? Strength. ? Flexibility. ? Cardiovascular fitness. ? Endurance. Contact a health care provider if:  The pain in your knee gets worse and is not relieved by  medicine.  Your knee catches or locks. Get help right away if:  Your patella slips out of its normal position again.  The swelling in your knee gets worse. Summary  Patellar dislocation and patellar subluxation are injuries that happen when the patella slips out of its normal position.  If your patella does not move back into place on its own, your health care provider will move it back into place.  Return to your normal activities as told by your health care provider. Ask your health care provider what activities are safe for you.  Do not use the injured limb to support your body weight until your health care provider says that you can. Use crutches as told by your health care provider.  Keep all follow-up visits as told by your health care provider. This is important.  This information is not intended to replace advice given to you by your health care provider. Make sure you discuss any questions you have with your health care provider. Document Revised: 03/15/2019 Document Reviewed: 10/11/2018 Elsevier Patient Education  2021 [employer].   Patellar Tendon Tear Rehab Ask your health care provider which exercises are safe for you. Do exercises exactly as told by your health care provider and adjust them as directed. It is normal to feel mild stretching, pulling, tightness, or discomfort as you do these exercises. Stop right away if you feel sudden pain or your pain gets worse. Do not begin these exercises until told by your health care provider. Stretching and range-of-motion exercise This exercise warms up your muscles and joints and improves the movement and flexibility of your knee. The exercise also helps to relieve pain and stiffness. Knee flexion This is an exercise in which you stretch (flex) your knee by lying on your back and sliding your heel toward your buttocks. 1. Lie on your back with both legs straight. If this causes back discomfort, bend your healthy knee so your  foot is flat on the floor. 2. Slowly slide your left / right heel back toward your buttocks. Stop when you feel a gentle stretch in the front of your knee or thigh, or when your knee is at a __________ degree angle. 3. Hold this position for __________ seconds. 4. Slowly slide your left / right heel back to the starting position. Repeat __________ times. Complete this exercise __________ times a day.   Strengthening exercises These exercises build strength and endurance in your knee. Endurance is the ability to use your muscles for a long time, even after they get tired. Quadriceps, isometric This exercise stretches the muscles in front of your thigh (quadriceps) without moving your knee joint (isometric). 1. Lie on your back with your left / right leg extended and your other knee bent. 2. Slowly tense the muscles in the front of your left / right thigh. When you do this, you should see your kneecap slide up toward your hip or see increased dimpling just above the knee. This motion will push the back of the knee down toward the floor. 3. For __________ seconds, hold the muscle as tight as you can without increasing your pain. 4. Relax the muscles slowly and completely. Repeat __________ times. Complete this exercise __________ times a day.   Straight leg raises This exercise stretches the muscles in front of your thigh (quadriceps) and the muscles that move your hips (hip flexors). 1. Lie on your back with your left / right leg extended and your other knee bent. 2. Slowly tense the muscles in the front of your left / right thigh. When you do this, you should see your kneecap slide up or see increased dimpling just above your knee. 3. Keep these muscles tight as you raise your leg 4-6 inches (10-15 cm) off the floor. Do not let your knee bend. 4. Hold this position for __________ seconds. 5. Keep these muscles tense as you lower your leg. 6. Relax your muscles slowly and completely. Repeat  __________ times. Complete this exercise __________ times a day. Straight leg raises This exercise strengthens the muscles that rotate the leg at the hip and move it away from your body (hip abductors). 1. Lie on your side with your left / right leg in the top position. Lie so your head, shoulder, knee, and hip line up. You may bend your bottom knee  to help you keep your balance. 2. Lift your top leg 4-6 inches (10-15 cm) while keeping your toes pointed straight ahead. 3. Hold this position for __________ seconds. 4. Slowly lower your leg to the starting position. 5. Allow your muscles to relax completely. Repeat __________ times. Complete this exercise __________ times a day.   Straight leg raises extensors This exercise stretches the muscles that move your hips away from the front of the pelvis (hip extensors). 1. Lie on your abdomen on a firm surface. 2. Tense the muscles in your buttocks and lift your left / right leg about 4 inches (10 cm). Keep your knee straight as you lift your leg. If you cannot lift your leg that high without arching your back, place a pillow under your hips. 3. Hold this position for __________ seconds. 4. Slowly lower your leg to the starting position. 5. Allow your muscles to relax completely. Repeat __________ times. Complete this exercise __________ times a day.   This information is not intended to replace advice given to you by your health care provider. Make sure you discuss any questions you have with your health care provider. Document Revised: 03/10/2019 Document Reviewed: 09/07/2018 Elsevier Patient Education  2021 [employer].

## 2021-03-02 LAB — LIPID PANEL
Chol/HDL Ratio: 2.9 ratio (ref 0.0–4.4)
Cholesterol, Total: 114 mg/dL (ref 100–199)
HDL: 39 mg/dL — ABNORMAL LOW (ref >39)
LDL Chol Calc (NIH): 61 mg/dL (ref 0–99)
Triglycerides: 62 mg/dL (ref 0–149)
VLDL Cholesterol Cal: 14 mg/dL (ref 5–40)

## 2021-03-02 LAB — FERRITIN: Ferritin: 14 ng/mL — ABNORMAL LOW (ref 15–150)

## 2021-03-02 LAB — IRON AND TIBC
Iron Saturation: 8 % — CL (ref 15–55)
Iron: 28 ug/dL (ref 27–139)
Total Iron Binding Capacity: 355 ug/dL (ref 250–450)
UIBC: 327 ug/dL (ref 118–369)

## 2021-03-02 NOTE — Progress Notes (Signed)
   CC: Knee Instability   HPI:  [name] is a 65 y.o. obese lady w/ PMHx as noted below, presenting for a routine follow up appointment. She says she has overall felt well, although notes she is struggling most with instability of her left knee. Her knee will give out on her every once in a while as she walks and is associated with a painless popping sensation of her knee. She has not fallen and denies any cardiovascular symptoms such as light-headedness, dizziness, chest pain, palpitations or any other symptoms when she feels unsteady. She also notes persistent snoring with frequent night-time awakenings and says she does not sleep with a CPAP at night and had not heard back from her sleep study referral last visit. She has persistent tingling in her bilateral fingers with movement of her wrists and has been wearing her wrist splints intermittently. She notes worsening exertional SOB recently but denies CP or swelling of her extremities. She does sleep sitting up on multiple pillows at baseline. She says she is due for cardiology follow up as she has not visited with them since prior to [facility]. She also attributes her difficulty sleeping at night to persistent heartburn. She takes Omeprazole once daily in the mornings. Denies any dark or bloody stools, abdominal pain, nausea, vomiting or any other symptoms.   Past Medical History:  Diagnosis Date  . Arthritis   . Depression   . Diastolic dysfunction without heart failure   . GERD (gastroesophageal reflux disease)   . Hepatic steatosis    a. seen on prior US.  Marland Kitchen Hyperlipidemia   . Hypertension   . Mild aortic regurgitation   . Mild aortic stenosis   . Premature atrial contractions   . PVC's (premature ventricular contractions)   . Thyroid disease   . TIA (transient ischemic attack)    2010  . Tobacco abuse    Social Hx: Patient lives at home. She does note she has transportation issues that interfere with her ability to get to  appointments and rides the bus for travel. She continues to smoke 3 cigarettes per day.   Review of Systems:  All others negative except as noted above in HPI.   Physical Exam:  Vitals:   03/01/21 1015  BP: 126/80  Pulse: 81  Temp: 98.2 F (36.8 C)  TempSrc: Oral  SpO2: 98%  Weight: 239 lb 4.8 oz (108.5 kg)  Height: 5\' 7"  (1.702 m)   General: Patient is obese and appears tired but otherwise well in NAD.  Eyes: Sclera non-icteric. No conjunctival injection.  HENT: MMM. No nasal discharge. Respiratory: Lungs are CTA, bilaterally. No wheezes, rales, or rhonchi.  Cardiovascular: Regular rate and rhythm. There is a 3/6 SEM heard best along the right upper chest, radiating up to the neck. There are 2/6 systolic murmurs along the left upper and lower sternal borders. No lower extremity edema. Unable to visualize JVD 2/2 body habitus. Pulses are 2+ throughout all four extremities.  Neurological: Awake and alert, oriented x 3.  Musculoskeletal: There is bilateral lateral patellar subluxation upon extension of the knees, L > R. There are no effusions and no tenderness of the bilateral knees and LE's.  Abdominal: Soft and non-tender to palpation. Bowel sounds intact. No rebound or guarding. Skin: No lesions. No rashes.  Psych: Normal affect. Normal tone of voice.   Assessment & Plan:   See Encounters Tab for problem based charting.  Patient discussed with Dr. Jimmye Norman.

## 2021-03-02 NOTE — Assessment & Plan Note (Addendum)
Patient attributes her difficulty sleeping to persistent/worsening GERD. She denies nausea, vomiting, troubles swallowing or dark stools. She takes omeprazole once daily 3 hours prior to her first meal.   - Increase omeprazole to twice daily, 30 minutes prior to meals  - Will refer to GI for consideration of EGD as part of IDA / persistent GERD workup

## 2021-03-02 NOTE — Assessment & Plan Note (Signed)
Patient does have a 3/6 SEM in the right upper chest and 2/6 systolic murmurs along the left sternal border, consistent with AS and AR. Her last ECHO in 2020 showed mild AS and AR. She has had worsening exertional SOB recently and sleeps propped on multiple pillows chronically. She does not appear overtly hypervolemic on exam today; however, her symptoms could be related to her underlying valvular disease.   - Ordered Complete ECHO  - Urged her to schedule regular (annual) follow-up with her cardiologist

## 2021-03-02 NOTE — Assessment & Plan Note (Signed)
Blood pressure is under good control today.  BP Readings from Last 3 Encounters:  03/01/21 126/80  04/23/20 122/74  02/08/20 137/85   - Continue Toprol-XL 25mg  daily  - Continue Hyzaar 100-25mg  daily

## 2021-03-02 NOTE — Assessment & Plan Note (Signed)
Patient continues to struggle with frequent night-time awakenings, witnessed apneic episodes, and snoring. Last sleep study in August 2020 showed moderate OSA with AHI of 18.3/hr. She was referred for repeat sleep study last visit in March 2021 and DME order for CPAP was placed at that time; however, patient never heard back regarding this.   - Referral placed for repeat split night study - DME order placed for CPAP w/ autotitration  - Will inform patient to call back [facility] if she does not hear back

## 2021-03-03 DIAGNOSIS — S83003A Unspecified subluxation of unspecified patella, initial encounter: Secondary | ICD-10-CM | POA: Insufficient documentation

## 2021-03-03 NOTE — Assessment & Plan Note (Addendum)
Patient notes she has had morning fatigue and dyspnea on exertion, which may be related to chronic anemia. She was found today to have severe IDA with ferritin 14, iron 28, saturations 8% and TIBC 355 (consistent with mix of anemia of chronic disease). She did miss 1 week of PO iron supplementation although otherwise has been taking this consistently.   Per chart review, she had 55mm sessile polyps of the cecum and transverse colon with a diminutive polyp in the splenic flexure and 3 flat polyps of the rectum up to 16mm in 2018. She followed with GI s/p internal hemorrhoid banding who were concerned for recurrent hemorrhoidal bleed; however, they did not intervene, informing her they would need to clarify whether her Plavix she was started on by cardiology for a recent stent could be discontinued for this procedure. She was due for repeat colonoscopy last year, which was not done. She denies any active dark or bloody stools or other GI symptoms aside from heartburn.  - Informed patient she will need to schedule an appointment with her cardiologist - Will refer her back to [facility] for consideration of endoscopies.  - Will order IV iron transfusion and repeat iron studies at 3 month f/u visit

## 2021-03-03 NOTE — Assessment & Plan Note (Signed)
Patient was found at her last appointment 10 months ago to have DM. Her A1c has improved to 6.1% this visit, consistent with pre-DM.   - Educated her on dietary and lifestyle changes to prevent DM in the future  - Referral sent to [employer]  for DM education

## 2021-03-03 NOTE — Assessment & Plan Note (Signed)
Patient has had thyroid testing within the last year that was normal. Denies new symptoms of thyroid disease.   - Continue Levothyroxine 124mcg daily

## 2021-03-03 NOTE — Assessment & Plan Note (Signed)
Emma Bonilla was found to have bilateral lateral patellar subluxation on exam today with infrequent episodes of knee instability (~weekly). She had been to PT for this 2 years ago with improvement but notes transportation issues interfere with her ability to attend these sessions currently.   - Provided quadricept strengthening exercises  - Encouraged weight loss  - Continue close monitoring

## 2021-03-03 NOTE — Assessment & Plan Note (Signed)
Patient continues to experience tingling into her bilateral hands consistent with carpal tunnel syndrome. She wears her wrist splints inconsistently and was unable to complete nerve conduction studies following her last visit.   - Encourage regular use of nocturnal wrist splints for now  - If little improvement with this, consider nerve conduction studies.

## 2021-03-03 NOTE — Assessment & Plan Note (Signed)
LDL within goal <70 today.   - Continue yearly monitoring on Zetia 10mg  and Atorvastatin 40mg  daily

## 2021-03-04 ENCOUNTER — Telehealth: Payer: Self-pay | Admitting: Student

## 2021-03-04 NOTE — Progress Notes (Signed)
[facility] Attending  Case discussed with Dr. Speakman  At the time of the visit.  We reviewed the resident's history and exam and pertinent patient test results.  I agree with the assessment, diagnosis, and plan of care documented in the resident's note.  

## 2021-03-04 NOTE — Telephone Encounter (Signed)
I spoke with [name] regarding her low iron and how she would benefit from an IV iron transfusion. Unfortunately, she is unable to make her appointment for this 03/07/21 as transfusions cannot be performed when she is free (after 4pm). We discussed how she will need close follow up with GI and that I've sent in a referral for EGD and colonoscopy. I also urged her to make a follow up appointment with her cardiologist (Dr. Debara Pickett) for follow up of her murmurs and discuss how she may need to go off Plavix for above procedures.   She expressed understanding.   Jeralyn Bennett, MD 03/04/2021, 11:55 AM Pager: [phone]

## 2021-03-07 ENCOUNTER — Inpatient Hospital Stay (HOSPITAL_COMMUNITY): Admission: RE | Admit: 2021-03-07 | Payer: 59 | Source: Ambulatory Visit

## 2021-03-11 ENCOUNTER — Telehealth: Payer: Self-pay | Admitting: Student

## 2021-03-11 NOTE — Telephone Encounter (Signed)
TOC HFU sch for 03/12/2021 @ 11:15 am with Dr. Konrad Penta.

## 2021-03-12 ENCOUNTER — Encounter: Payer: Self-pay | Admitting: Student

## 2021-03-12 ENCOUNTER — Ambulatory Visit (INDEPENDENT_AMBULATORY_CARE_PROVIDER_SITE_OTHER): Payer: 59 | Admitting: Student

## 2021-03-12 VITALS — BP 120/80 | HR 87 | Temp 98.2°F | Ht 67.0 in | Wt 239.1 lb

## 2021-03-12 DIAGNOSIS — G4733 Obstructive sleep apnea (adult) (pediatric): Secondary | ICD-10-CM

## 2021-03-12 DIAGNOSIS — K219 Gastro-esophageal reflux disease without esophagitis: Secondary | ICD-10-CM

## 2021-03-12 DIAGNOSIS — E611 Iron deficiency: Secondary | ICD-10-CM | POA: Diagnosis not present

## 2021-03-12 DIAGNOSIS — Z122 Encounter for screening for malignant neoplasm of respiratory organs: Secondary | ICD-10-CM | POA: Diagnosis not present

## 2021-03-12 DIAGNOSIS — K921 Melena: Secondary | ICD-10-CM | POA: Diagnosis not present

## 2021-03-12 DIAGNOSIS — Z720 Tobacco use: Secondary | ICD-10-CM

## 2021-03-12 DIAGNOSIS — I350 Nonrheumatic aortic (valve) stenosis: Secondary | ICD-10-CM

## 2021-03-12 DIAGNOSIS — D500 Iron deficiency anemia secondary to blood loss (chronic): Secondary | ICD-10-CM

## 2021-03-12 DIAGNOSIS — R0600 Dyspnea, unspecified: Secondary | ICD-10-CM

## 2021-03-12 DIAGNOSIS — R0609 Other forms of dyspnea: Secondary | ICD-10-CM

## 2021-03-12 LAB — CBC
HCT: 42.8 % (ref 36.0–46.0)
Hemoglobin: 13.4 g/dL (ref 12.0–15.0)
MCH: 27.7 pg (ref 26.0–34.0)
MCHC: 31.3 g/dL (ref 30.0–36.0)
MCV: 88.6 fL (ref 80.0–100.0)
Platelets: 410 10*3/uL — ABNORMAL HIGH (ref 150–400)
RBC: 4.83 MIL/uL (ref 3.87–5.11)
RDW: 14.6 % (ref 11.5–15.5)
WBC: 5.5 10*3/uL (ref 4.0–10.5)
nRBC: 0 % (ref 0.0–0.2)

## 2021-03-12 NOTE — Patient Instructions (Signed)
Emma Bonilla,   I am glad to hear that your heartburn and knee symptoms have improved since your last visit!   Given your persistent GI bleeding, I will make your GI referral more urgent and will recommend both an upper and lower endoscopy to help aid in finding the source of your bleeding.   We will check your blood counts today to see if you may require blood transfusion in addition to your iron transfusion.   I also sent in a referral for a lung cancer screening CT given your long history of smoking; however, it is great to hear that you are working towards cutting back on your smoking! We can further discuss cessation options at a future visit.   I will call you with results of the above as soon as they are available. I will let you know when you will require a follow up visit at that time. Please don't hesitate to call [phone] in the meantime with any questions or concerns!  Dr. Konrad Penta    Rectal Bleeding  Rectal bleeding is when blood comes out of the opening of the butt (anus). People with this kind of bleeding may notice bright red blood in their underwear or in the toilet after they poop (have a bowel movement). They may also have blood mixed with their poop (stool), or dark red or black poop. Rectal bleeding is often a sign that something is wrong. This condition can be caused by many things. It needs to be checked by a doctor. Your doctor will do tests to know what is causing your condition. Follow these instructions at home: Watch for any changes in your condition. Take these actions to help with bleeding and discomfort: Medicines  Take over-the-counter and prescription medicines only as told by your doctor.  Ask your doctor about changing or stopping your normal medicines. This is important if you are taking blood thinners. Medicines that thin the blood can make rectal bleeding worse. Managing constipation Your condition may cause trouble pooping (constipation). To  prevent or treat trouble pooping, or to help make your poop soft, you may need to:  Drink enough fluid to keep your pee (urine) pale yellow.  Take over-the-counter or prescription medicines.  Eat foods that are high in fiber. These include beans, whole grains, and fresh fruits and vegetables.  Limit foods that are high in fat and sugar. These include fried or sweet foods.   General instructions  Try not to strain when you poop.  Try taking a warm bath. This may help with pain.  Keep all follow-up visits as told by your doctor. This is important. Contact a doctor if:  You have pain or swelling in your belly (abdomen).  You have a fever.  You feel weak.  You feel like you may vomit.  You cannot poop. Get help right away if:  You have new bleeding.  You have more bleeding than before.  You have black or dark red poop.  You vomit blood or something that looks like coffee grounds.  You pass out (faint).  You have very bad pain in your butt. Summary  Rectal bleeding is when blood comes out of the opening of the butt. This bleeding is often a sign that something is wrong.  Eat a diet that is high in fiber. This will help to keep your poop soft.  Talk to your doctor if you take medicines that thin the blood. These medicines can make bleeding worse.  Get help right  away if you have new or more bleeding, black or dark red poop, or blood in your vomit. Also, get help if you pass out or have very bad pain in your butt. This information is not intended to replace advice given to you by your health care provider. Make sure you discuss any questions you have with your health care provider. Document Revised: 10/19/2019 Document Reviewed: 10/19/2019 Elsevier Patient Education  2021 [employer].

## 2021-03-12 NOTE — Progress Notes (Signed)
   CC: Hematochezia   HPI:  Emma Bonilla is a 65 y.o. lady w/ PMHx internal hemorrhoidal banding with recurrent rectal bleeding following this after starting Plavix for coronary PCI in 2018, HFpEF with mild intrathoracic aortic aneurysm and AS/AR, moderate OSA (last sleep study in 2020) without CPAP at home, GERD, and recently-diagnosed severe iron deficiency, presenting due to persistent hematochezia. There was some confusion that she was supposed to return to [facility] today to receive IV iron transfusion. Although this was in the process of being scheduled, we are unable to administer IV replacement in the clinic. Please see problem-based assessment and plan for full details of above medical conditions.   Past Medical History:  Diagnosis Date  . Arthritis   . Depression   . Diastolic dysfunction without heart failure   . GERD (gastroesophageal reflux disease)   . Hepatic steatosis    a. seen on prior US.  Marland Kitchen Hyperlipidemia   . Hypertension   . Mild aortic regurgitation   . Mild aortic stenosis   . Premature atrial contractions   . PVC's (premature ventricular contractions)   . Thyroid disease   . TIA (transient ischemic attack)    2010  . Tobacco abuse    Review of Systems:    Positive for: intermittent hematochezia, DOE, heartburn (improving), bilateral knee subluxation (improving)  Negative for: nausea, vomiting, abdominal pain, light-headedness, dizziness, shortness of breath at rest, cough, fevers, chills, chest pain, weakness, numbness  Physical Exam:  Vitals:   03/12/21 1035  BP: 120/80  Pulse: 87  Temp: 98.2 F (36.8 C)  TempSrc: Oral  SpO2: 100%  Weight: 239 lb 1.6 oz (108.5 kg)  Height: 5\' 7"  (1.702 m)   General: Patient is obese. She appears tired but otherwise well in no acute distress. Eyes: Sclera non-icteric. No conjunctival injection.  HENT: MMM. No nasal discharge. Respiratory: Lungs are CTA, bilaterally. No wheezes, rales, or rhonchi. No tachypnea or  increased work of breathing on room air. Cardiovascular: Regular rate and rhythm. There is a 3/6 SEM heard over the right upper chest and 3/6 systolic murmur heard along the left sternal border. No audible rubs or gallops. No lower extremity edema.  Neurological: Alert and oriented x 3.  Musculoskeletal: There is no tenderness to palpation of the bilateral knee joints.  Abdominal: Soft, not distended and non-tender to palpation. Bowel sounds intact. No rebound or guarding. Skin: No lesions. No rashes.  Psych: Normal affect. Normal tone of voice.   Assessment & Plan:   See Encounters Tab for problem based charting.  Patient discussed with Dr. Dareen Piano.  Jeralyn Bennett, MD 03/13/2021, 11:33 AM Pager: [phone]

## 2021-03-13 ENCOUNTER — Telehealth: Payer: Self-pay | Admitting: Student

## 2021-03-13 ENCOUNTER — Other Ambulatory Visit: Payer: Self-pay | Admitting: Student

## 2021-03-13 DIAGNOSIS — E611 Iron deficiency: Secondary | ICD-10-CM

## 2021-03-13 NOTE — Assessment & Plan Note (Signed)
Patient continues to smoke about 1/4 PPD which she has been doing since young adulthood. She has >20 pack year smoking history, qualifies for lung cancer screening CT.   - Low dose lung cancer screening CT ordered  - Continue to encourage cessation with assistive therapies as desired

## 2021-03-13 NOTE — Assessment & Plan Note (Signed)
Patient does endorse DOE although denies SOB at rest, cough, or other symptoms. She appears clinically euvolemic despite a history of HFpEF seen on previous ECHO.   - Follow up ECHO results - Follow up cardiology recommendations  - Consider alternative diagnoses such as COPD vs. Pulmonary HTN (in setting of OSA) vs. AI condition (in setting of xerostomia) if above non-contributory

## 2021-03-13 NOTE — Assessment & Plan Note (Signed)
Patient has never had a CPAP mask at home. DME order was placed last visit.   - Will reach out to RN to discuss whether this is in process

## 2021-03-13 NOTE — Telephone Encounter (Signed)
Called to inform patient of her lab results. Her CBC did not show anemia. Her platelets were elevated, although likely reactive due to slow GI bleeding. Given that she does have severe iron deficiency, I scheduled her for single IV feraheme transfusion.   - IV feraheme x 1 @ 11am 03/29/21 - Repeat iron studies in 3 months (06/28/21) or sooner if concern for ongoing bleeding  - Instructed her to please call if she does not hear back from GI to schedule EGD/colonoscopy within the next couple of weeks.  - She will call to schedule follow up appointment with [facility] after she is seen by GI  No questions or concerns.  Jeralyn Bennett, MD 03/13/2021, 10:06 AM Pager: [phone]

## 2021-03-13 NOTE — Assessment & Plan Note (Signed)
Patient endorses intermittent melena. She has struggled recently with worsening heartburn symptoms and does occasionally feel she has had trouble swallowing although this is not consistently an issue. Heartburn symptoms seem to be improving with increase in PPI use to BID.   - Continue omeprazole 40mg  BID for now  - Will make GI referral more urgent for consideration of EGD in addition to colonoscopy

## 2021-03-13 NOTE — Assessment & Plan Note (Signed)
Patient was unable to come to hospital for IV feraheme transfusion this past week. CBC today does not show anemia despite ongoing reports of bleeding.   - Awaiting GI referral appointment  - Instructed her to call for more severe bleeding episodes  - IV feraheme single transfusion scheduled for 03/29/21 @ 11am

## 2021-03-13 NOTE — Assessment & Plan Note (Signed)
Patient has not yet received a call to schedule an ECHO for evaluation of progressive murmurs with hx of AS and AR in the past.   - Expect call to schedule ECHO - Cardiology follow up is scheduled fro 04/16/20

## 2021-03-14 ENCOUNTER — Encounter: Payer: Self-pay | Admitting: Dietician

## 2021-03-14 NOTE — Progress Notes (Signed)
[facility] Attending  Case discussed with Dr. Speakman  At the time of the visit.  We reviewed the resident's history and exam and pertinent patient test results.  I agree with the assessment, diagnosis, and plan of care documented in the resident's note.  

## 2021-03-18 ENCOUNTER — Encounter: Payer: Self-pay | Admitting: *Deleted

## 2021-03-28 NOTE — Discharge Instructions (Signed)
Ferumoxytol injection What is this medicine? FERUMOXYTOL is an iron complex. Iron is used to make healthy red blood cells, which carry oxygen and nutrients throughout the body. This medicine is used to treat iron deficiency anemia. This medicine may be used for other purposes; ask your health care provider or pharmacist if you have questions. COMMON BRAND NAME(S): Feraheme What should I tell my health care provider before I take this medicine? They need to know if you have any of these conditions:  anemia not caused by low iron levels  high levels of iron in the blood  magnetic resonance imaging (MRI) test scheduled  an unusual or allergic reaction to iron, other medicines, foods, dyes, or preservatives  pregnant or trying to get pregnant  breast-feeding How should I use this medicine? This medicine is for injection into a vein. It is given by a health care professional in a hospital or clinic setting. Talk to your pediatrician regarding the use of this medicine in children. Special care may be needed. Overdosage: If you think you have taken too much of this medicine contact a poison control center or emergency room at once. NOTE: This medicine is only for you. Do not share this medicine with others. What if I miss a dose? It is important not to miss your dose. Call your doctor or health care professional if you are unable to keep an appointment. What may interact with this medicine? This medicine may interact with the following medications:  other iron products This list may not describe all possible interactions. Give your health care provider a list of all the medicines, herbs, non-prescription drugs, or dietary supplements you use. Also tell them if you smoke, drink alcohol, or use illegal drugs. Some items may interact with your medicine. What should I watch for while using this medicine? Visit your doctor or healthcare professional regularly. Tell your doctor or healthcare  professional if your symptoms do not start to get better or if they get worse. You may need blood work done while you are taking this medicine. You may need to follow a special diet. Talk to your doctor. Foods that contain iron include: whole grains/cereals, dried fruits, beans, or peas, leafy green vegetables, and organ meats (liver, kidney). What side effects may I notice from receiving this medicine? Side effects that you should report to your doctor or health care professional as soon as possible:  allergic reactions like skin rash, itching or hives, swelling of the face, lips, or tongue  breathing problems  changes in blood pressure  feeling faint or lightheaded, falls  fever or chills  flushing, sweating, or hot feelings  swelling of the ankles or feet Side effects that usually do not require medical attention (report to your doctor or health care professional if they continue or are bothersome):  diarrhea  headache  nausea, vomiting  stomach pain This list may not describe all possible side effects. Call your doctor for medical advice about side effects. You may report side effects to FDA at 1-800-FDA-1088. Where should I keep my medicine? This drug is given in a hospital or clinic and will not be stored at home. NOTE: This sheet is a summary. It may not cover all possible information. If you have questions about this medicine, talk to your doctor, pharmacist, or health care provider.  2021 Elsevier/Gold Standard (2017-01-05 20:21:10)  

## 2021-03-29 ENCOUNTER — Other Ambulatory Visit: Payer: Self-pay

## 2021-03-29 ENCOUNTER — Ambulatory Visit (HOSPITAL_COMMUNITY)
Admission: RE | Admit: 2021-03-29 | Discharge: 2021-03-29 | Disposition: A | Discharge status: Home / Self Care | Payer: 59 | Source: Ambulatory Visit | Attending: Internal Medicine | Admitting: Internal Medicine

## 2021-03-29 DIAGNOSIS — E611 Iron deficiency: Secondary | ICD-10-CM | POA: Diagnosis present

## 2021-03-29 MED ORDER — SODIUM CHLORIDE 0.9 % IV SOLN
510.0000 mg | Freq: Once | INTRAVENOUS | Status: AC
  Administered 2021-03-29: 510 mg via INTRAVENOUS
  Filled 2021-03-29: qty 510

## 2021-04-15 ENCOUNTER — Encounter (HOSPITAL_COMMUNITY): Payer: Self-pay

## 2021-04-15 ENCOUNTER — Other Ambulatory Visit: Payer: Self-pay

## 2021-04-15 ENCOUNTER — Ambulatory Visit (HOSPITAL_COMMUNITY)
Admission: RE | Admit: 2021-04-15 | Discharge: 2021-04-15 | Disposition: A | Discharge status: Home / Self Care | Payer: Medicare HMO | Source: Ambulatory Visit | Attending: Internal Medicine | Admitting: Internal Medicine

## 2021-04-15 DIAGNOSIS — F17210 Nicotine dependence, cigarettes, uncomplicated: Secondary | ICD-10-CM | POA: Diagnosis not present

## 2021-04-15 DIAGNOSIS — J439 Emphysema, unspecified: Secondary | ICD-10-CM | POA: Diagnosis not present

## 2021-04-15 DIAGNOSIS — I700 Atherosclerosis of aorta: Secondary | ICD-10-CM | POA: Diagnosis not present

## 2021-04-15 DIAGNOSIS — Z122 Encounter for screening for malignant neoplasm of respiratory organs: Secondary | ICD-10-CM | POA: Insufficient documentation

## 2021-04-15 DIAGNOSIS — I2510 Atherosclerotic heart disease of native coronary artery without angina pectoris: Secondary | ICD-10-CM | POA: Insufficient documentation

## 2021-04-15 DIAGNOSIS — K760 Fatty (change of) liver, not elsewhere classified: Secondary | ICD-10-CM | POA: Insufficient documentation

## 2021-04-16 ENCOUNTER — Ambulatory Visit: Payer: 59 | Admitting: Internal Medicine

## 2021-04-18 ENCOUNTER — Ambulatory Visit (INDEPENDENT_AMBULATORY_CARE_PROVIDER_SITE_OTHER): Payer: 59 | Admitting: Gastroenterology

## 2021-04-18 ENCOUNTER — Encounter: Payer: Self-pay | Admitting: Gastroenterology

## 2021-04-18 ENCOUNTER — Telehealth: Payer: Self-pay

## 2021-04-18 VITALS — BP 120/60 | HR 72 | Ht 67.0 in | Wt 236.5 lb

## 2021-04-18 DIAGNOSIS — K219 Gastro-esophageal reflux disease without esophagitis: Secondary | ICD-10-CM

## 2021-04-18 DIAGNOSIS — Z7902 Long term (current) use of antithrombotics/antiplatelets: Secondary | ICD-10-CM

## 2021-04-18 DIAGNOSIS — Z86010 Personal history of colonic polyps: Secondary | ICD-10-CM

## 2021-04-18 DIAGNOSIS — E611 Iron deficiency: Secondary | ICD-10-CM | POA: Diagnosis not present

## 2021-04-18 MED ORDER — NA SULFATE-K SULFATE-MG SULF 17.5-3.13-1.6 GM/177ML PO SOLN: 1.0000 | Freq: Once | ORAL | 0 refills | Status: AC

## 2021-04-18 NOTE — Progress Notes (Signed)
04/18/2021 Emma Bonilla [id number] 1956/09/16   HISTORY OF PRESENT ILLNESS: This is a 65 year old female who is a patient of Dr. Doyne Keel.  She is overdue for surveillance colonoscopy.  While she is here today she also says that she has had acid reflux for several years.  She says that starting last year she has noted that she is unable to lay down flat because of the acid coming up.  It tends to bother her mostly at nighttime and if she remains upright and then it is much less of an issue.  She denies any nausea, vomiting, dysphagia.  She is on omeprazole 40 mg twice daily.  She said that she had an EGD several years ago and was told that she had 5 ulcers at that time.  Last colonoscopy 01/2017 showed the following:  - Non-thrombosed external hemorrhoids found on perianal exam. - One 4 mm polyp in the cecum, removed with a cold snare. Resected and retrieved. - One 4 mm polyp in the transverse colon, removed with a cold snare. Resected and retrieved. - One diminutive polyp at the splenic flexure, removed with a cold snare. Resected and retrieved. - Three 3 to 5 mm polyps in the rectum, removed with a cold snare. Resected and retrieved. - Internal hemorrhoids. - The examination was otherwise normal.  For the polyps were tubular adenomas.  Repeat colonoscopy is recommended at 3-year interval.  She admits to some bright red rectal bleeding about once a week.  Had internal hemorrhoid banding with Dr. Havery Moros in the past.  Is interested in repeat banding if internal hemorrhoids are seen again on colonoscopy.  It is noted that her iron studies are running low.  Hemoglobin is normal.  She has been on ferrous sulfate 325 mg daily for a long time, tolerates it well.  She is on Plavix for history of coronary artery disease with stents in the remote past.  Her cardiologist Dr. Debara Pickett.   Past Medical History:  Diagnosis Date  . Anemia   . Arthritis   . Depression   . Diastolic  dysfunction without heart failure   . GERD (gastroesophageal reflux disease)   . Hepatic steatosis    a. seen on prior US.  Marland Kitchen Hyperlipidemia   . Hypertension   . Mild aortic regurgitation   . Mild aortic stenosis   . Premature atrial contractions   . PVC's (premature ventricular contractions)   . Thyroid disease   . TIA (transient ischemic attack)    2010  . Tobacco abuse    Past Surgical History:  Procedure Laterality Date  . CORONARY STENT INTERVENTION N/A 10/13/2017   Procedure: CORONARY STENT INTERVENTION;  Surgeon: Leonie Man, MD;  Location: [facility];  Service: Cardiovascular;  Laterality: N/A;  . DILATION AND CURETTAGE OF UTERUS     30 years ago  . FOOT SURGERY    . LEFT HEART CATH AND CORONARY ANGIOGRAPHY N/A 10/13/2017   Procedure: LEFT HEART CATH AND CORONARY ANGIOGRAPHY;  Surgeon: Leonie Man, MD;  Location: [facility];  Service: Cardiovascular;  Laterality: N/A;    reports that she has been smoking cigarettes. She has a 22.50 pack-year smoking history. She has never used smokeless tobacco. She reports current alcohol use. She reports current drug use. Frequency: 1.00 time per week. Drug: Marijuana. family history includes Cancer in her mother and sister; Colon cancer in her sister; Congestive Heart Failure in her sister; Crohn's disease in her father and  sister; Diabetes in her brother; Emphysema in her mother; Heart disease in her father, sister, and sister; Heart failure in her mother; Hyperlipidemia in her child, child, sister, and sister; Hypertension in her child, father, mother, sister, sister, and sister; Liver cancer in her brother; Skin cancer in her maternal grandmother; Stroke in her maternal grandfather. Allergies  Allergen Reactions  . Naproxen Sodium Rash      Outpatient Encounter Medications as of 04/18/2021  Medication Sig  . amitriptyline (ELAVIL) 25 MG tablet Take 2 tablets (50 mg total) by mouth at bedtime.  Marland Kitchen atorvastatin  (LIPITOR) 40 MG tablet TAKE 1 TABLET BY MOUTH ONCE DAILY AT 6 PM  . buPROPion (WELLBUTRIN SR) 150 MG 12 hr tablet TAKE 1 TABLET (150MG  TOTAL) BY MOUTH 2 TIMES A DAY  . calcium-vitamin D (OSCAL WITH D) 500-200 MG-UNIT per tablet Take 1 tablet by mouth daily.  . cetirizine (ZYRTEC) 10 MG tablet TAKE 1 TABLET (10MG  TOTAL) BY MOUTH DAILY  . clopidogrel (PLAVIX) 75 MG tablet Take 1 tablet by mouth once daily with breakfast  . diclofenac Sodium (VOLTAREN) 1 % GEL Apply 2 g topically 4 (four) times daily.  Marland Kitchen ezetimibe (ZETIA) 10 MG tablet Take 1 tablet (10 mg total) by mouth daily.  . ferrous sulfate 325 (65 FE) MG tablet Take 1 tablet (325 mg total) by mouth daily.  Marland Kitchen levothyroxine (SYNTHROID) 100 MCG tablet TAKE 1 TABLET (100MCG TOTAL) BY MOUTH DAILY.  Marland Kitchen losartan-hydrochlorothiazide (HYZAAR) 100-25 MG tablet Take 1 tablet by mouth daily.  . Melatonin 3 MG TABS Take 3 mg by mouth at bedtime.  . metFORMIN (GLUCOPHAGE) 500 MG tablet Take 1 tablet (500 mg total) by mouth 2 (two) times daily with a meal.  . metoprolol succinate (TOPROL-XL) 25 MG 24 hr tablet Take 1/2 (one-half) tablet by mouth once daily  . NASONEX 50 MCG/ACT nasal spray PLACE 2 SPRAYS INTO THE NOSE DAILY AS NEEDED.  Marland Kitchen omeprazole (PRILOSEC) 40 MG capsule Take 1 capsule (40 mg total) by mouth 2 (two) times daily.  Marland Kitchen PAZEO 0.7 % SOLN PLACE 1 DROP TO EYE DAILY.   No facility-administered encounter medications on file as of 04/18/2021.     REVIEW OF SYSTEMS  : All other systems reviewed and negative except where noted in the History of Present Illness.   PHYSICAL EXAM: BP 120/60   Pulse 72   Ht 5\' 7"  (1.702 m)   Wt 236 lb 8 oz (107.3 kg)   LMP 02/11/2011 Comment: no chance of pregnancy per pt  BMI 37.04 kg/m  General: Well developed AA female in no acute distress Head: Normocephalic and atraumatic Eyes:  Sclerae anicteric, conjunctiva pink. Ears: Normal auditory acuity Lungs: Clear throughout to auscultation; no W/R/R. Heart:  Regular rate and rhythm; no M/R/G. Abdomen: Soft, non-distended.  BS present.  Mild epigastric TTP. Rectal:  Will be done at the time of colonoscopy. Musculoskeletal: Symmetrical with no gross deformities  Skin: No lesions on visible extremities Extremities: No edema  Neurological: Alert oriented x 4, grossly non-focal Psychological:  Alert and cooperative. Normal mood and affect  ASSESSMENT AND PLAN: *Personal history of colon polyps: Had several tubular adenomas removed in February 2018.  3-year recall was recommended.  We will schedule for colonoscopy with Dr. Havery Moros. *Iron deficiency: Hemoglobin is normal, but iron studies remain low despite ongoing supplementation with ferrous sulfate 325 mg daily.  I have advised her to increase it to twice daily for now. *GERD: Ongoing issues for years.  Symptoms recently  despite omeprazole 40 mg twice daily.  Reports having had EGD several years ago.  We will plan for EGD with Dr. Havery Moros as well. *Chronic antiplatelet use with Plavix due to history of coronary artery disease:  Hold Plavix 5 days before procedure - will instruct when and how to resume after procedure. Risks and benefits of procedure including bleeding, perforation, infection, missed lesions, medication reactions and possible hospitalization or surgery if complications occur explained. Additional rare but real risk of cardiovascular event such as heart attack or ischemia/infarct of other organs off Plavix explained and need to seek urgent help if this occurs. Will communicate by phone or EMR with patient's prescribing provider, Dr. Debara Pickett, to confirm that holding Plavix is reasonable in this case.    CC:  Jeralyn Bennett, MD

## 2021-04-18 NOTE — Telephone Encounter (Signed)
[facility] Pre-operative Risk Assessment     Request for surgical clearance:     Endoscopy Procedure  What type of surgery is being performed?     Endoscopy and Colonoscopy   When is this surgery scheduled?     07/15/21  What type of clearance is required ?   Pharmacy  Are there any medications that need to be held prior to surgery and how long? Plavix 5 days   Practice name and name of physician performing surgery?      [facility]  What is your office phone and fax number?      Phone- [phone]  [phone]  Anesthesia type (None, local, MAC, general) ?       MAC

## 2021-04-18 NOTE — Telephone Encounter (Signed)
Primary Cardiologist:Kenneth C Hilty, MD  Chart reviewed as part of pre-operative protocol coverage. Because of KORALYNN GREENSPAN past medical history and time since last visit, he/she will require a follow-up visit in order to better assess preoperative cardiovascular risk.  Pre-op covering staff: - Please schedule appointment and call patient to inform them. - Please contact requesting surgeon's office via preferred method (i.e, phone, fax) to inform them of need for appointment prior to surgery.  If applicable, this message will also be routed to pharmacy pool and/or primary cardiologist for input on holding anticoagulant/antiplatelet agent as requested below so that this information is available at time of patient's appointment.   Deberah Pelton, NP  04/18/2021, 3:17 PM

## 2021-04-18 NOTE — Telephone Encounter (Signed)
Pt already has an appointment scheduled with Coletta Memos, PA-C on 6-17 at 8:15 am. I have updated appointment notes to reflect pre-op clearance.  Will fax recommendations over to requesting surgeon's office.

## 2021-04-18 NOTE — Progress Notes (Signed)
Agree with assessment and plan as outlined.  

## 2021-04-18 NOTE — Patient Instructions (Signed)
If you are age 65 or older, your body mass index should be between 23-30. Your Body mass index is 37.04 kg/m. If this is out of the aforementioned range listed, please consider follow up with your Primary Care Provider.  If you are age 58 or younger, your body mass index should be between 19-25. Your Body mass index is 37.04 kg/m. If this is out of the aformentioned range listed, please consider follow up with your Primary Care Provider.   You have been scheduled for an endoscopy and colonoscopy. Please follow the written instructions given to you at your visit today. Please pick up your prep supplies at the pharmacy within the next 1-3 days. If you use inhalers (even only as needed), please bring them with you on the day of your procedure.  Due to recent changes in healthcare laws, you may see the results of your imaging and laboratory studies on MyChart before your provider has had a chance to review them.  We understand that in some cases there may be results that are confusing or concerning to you. Not all laboratory results come back in the same time frame and the provider may be waiting for multiple results in order to interpret others.  Please give [place] 48 hours in order for your provider to thoroughly review all the results before contacting the office for clarification of your results.   Thank you for choosing me and [facility].  Alonza Bogus, PA-C

## 2021-04-25 ENCOUNTER — Encounter: Payer: Self-pay | Admitting: *Deleted

## 2021-04-30 DIAGNOSIS — K760 Fatty (change of) liver, not elsewhere classified: Secondary | ICD-10-CM | POA: Insufficient documentation

## 2021-05-13 ENCOUNTER — Other Ambulatory Visit: Payer: Self-pay | Admitting: Internal Medicine

## 2021-05-13 DIAGNOSIS — K219 Gastro-esophageal reflux disease without esophagitis: Secondary | ICD-10-CM

## 2021-05-13 MED ORDER — OMEPRAZOLE 40 MG PO CPDR: 40.0000 mg | DELAYED_RELEASE_CAPSULE | Freq: Two times a day (BID) | ORAL | 2 refills | Status: DC

## 2021-05-13 NOTE — Telephone Encounter (Signed)
Refill Request     omeprazole (PRILOSEC) 40 MG capsule     [address] [place], [place] - [address] (Ph: [phone])

## 2021-05-13 NOTE — Telephone Encounter (Signed)
Last rx written 03/01/21 but as "No Print" ; re-send electronically. Thanks

## 2021-05-14 ENCOUNTER — Telehealth: Payer: Self-pay

## 2021-05-14 NOTE — Telephone Encounter (Signed)
Received refill request from [facility] for Metformin 500mg  tab, twice daily dosing. On med list, RX is expired. On patient's visit 03/01/21, patient's A1C decreased. Will forward to PCP to review and resend RX if appropriate. Thank you, SChaplin, RN,BSN

## 2021-05-15 NOTE — Progress Notes (Addendum)
Cardiology Clinic Note   Patient Name: Emma Bonilla Date of Encounter: 05/17/2021  Primary Care Provider:  Angelica Pou, MD Primary Cardiologist:  Pixie Casino, MD  Patient Profile    Emma Bonilla 65 year old female presents the clinic today for follow-up evaluation of her hypertension, coronary artery disease and preoperative cardiac evaluation.  Past Medical History    Past Medical History:  Diagnosis Date   Anemia    Arthritis    Depression    Diastolic dysfunction without heart failure    GERD (gastroesophageal reflux disease)    Hepatic steatosis    a. seen on prior US.   Hyperlipidemia    Hypertension    Mild aortic regurgitation    Mild aortic stenosis    Premature atrial contractions    PVC's (premature ventricular contractions)    Thyroid disease    TIA (transient ischemic attack)    2010   Tobacco abuse    Past Surgical History:  Procedure Laterality Date   CORONARY STENT INTERVENTION N/A 10/13/2017   Procedure: CORONARY STENT INTERVENTION;  Surgeon: Leonie Man, MD;  Location: [facility];  Service: Cardiovascular;  Laterality: N/A;   DILATION AND CURETTAGE OF UTERUS     30 years ago   FOOT SURGERY     LEFT HEART CATH AND CORONARY ANGIOGRAPHY N/A 10/13/2017   Procedure: LEFT HEART CATH AND CORONARY ANGIOGRAPHY;  Surgeon: Leonie Man, MD;  Location: [facility];  Service: Cardiovascular;  Laterality: N/A;    Allergies  Allergies  Allergen Reactions   Naproxen Sodium Rash    History of Present Illness    Ms. [name] has a PMH of essential hypertension, CAD status postcoronary angioplasty 11/18 she received overlapping stents to her RCA, aortic valve stenosis, TIA, OSA, hypothyroidism, tobacco abuse, iron deficiency anemia, depression, obesity, prediabetes, and HLD.  Her echocardiogram 7/20 showed EF greater than 65%, tricuspid aortic valve, mild aortic insufficiency, and mild aortic stenosis, mild dilation of  ascending aorta measuring 39 mm.  She was last connected with by Almyra Deforest, PA-C on 02/08/2020.  During that time she denied exertional chest pain and shortness of breath.  She was doing well from a cardiac standpoint.  Her cholesterol panel was reviewed and was well controlled.  She denied lower extremity edema, orthopnea, and PND.  She was compliant with her CPAP but had not received any further information/reports and wished to transfer her care to Dr. Claiborne Billings for further management.  It was recommended that she continue with her PCP for further management and titrations.  She presents to the clinic today for follow-up evaluation states has noticed some shortness of breath with increased physical activity.  She denies shortness of breath with normal daily activities.  She reports that she has been walking 5 to 10 minutes daily and cares for her 29-year-old grandchild.  She reports that her increased shortness of breath is been over the last 2 months.  She is being seen and evaluated by GI and has a colonoscopy planned.  She does occasionally note blood clots in her stool.  We reviewed her last CBC which showed low normal hemoglobin.  She reports she is compliant with her iron supplementation.  She did receive a iron infusion in May.  I have asked her to increase the iron in her diet and use cast iron cookware.  We reviewed her a sending aortic aneurysm.  I will order a follow-up echocardiogram, give her the salty 6  diet sheet, diet information on iron diet, have her increase her physical activity as tolerated, and follow-up in 12 months.  Today she denies chest pain, increased shortness of breath, lower extremity edema, fatigue, palpitations, melena, hematuria, hemoptysis, diaphoresis, weakness, presyncope, syncope, orthopnea, and PND.   Home Medications    Prior to Admission medications   Medication Sig Start Date End Date Taking? Authorizing Provider  amitriptyline (ELAVIL) 25 MG tablet Take 2 tablets  (50 mg total) by mouth at bedtime. 04/23/20   Welford Roche, MD  atorvastatin (LIPITOR) 40 MG tablet TAKE 1 TABLET BY MOUTH ONCE DAILY AT 6 PM 04/23/20   Welford Roche, MD  buPROPion (WELLBUTRIN SR) 150 MG 12 hr tablet TAKE 1 TABLET (150MG  TOTAL) BY MOUTH 2 TIMES A DAY 03/01/21   Jeralyn Bennett, MD  calcium-vitamin D (OSCAL WITH D) 500-200 MG-UNIT per tablet Take 1 tablet by mouth daily. 08/08/13   Jones Bales, MD  cetirizine (ZYRTEC) 10 MG tablet TAKE 1 TABLET (10MG  TOTAL) BY MOUTH DAILY 01/31/21   Gaylan Gerold, DO  clopidogrel (PLAVIX) 75 MG tablet Take 1 tablet by mouth once daily with breakfast 07/24/20   Pixie Casino, MD  diclofenac Sodium (VOLTAREN) 1 % GEL Apply 2 g topically 4 (four) times daily. 01/16/20   Santos-Sanchez, Merlene Morse, MD  ezetimibe (ZETIA) 10 MG tablet Take 1 tablet (10 mg total) by mouth daily. 06/19/20   Hilty, Nadean Corwin, MD  ferrous sulfate 325 (65 FE) MG tablet Take 1 tablet (325 mg total) by mouth daily. 03/31/18   Santos-Sanchez, Merlene Morse, MD  levothyroxine (SYNTHROID) 100 MCG tablet TAKE 1 TABLET (100MCG TOTAL) BY MOUTH DAILY. 08/15/20   Sanjuan Dame, MD  losartan-hydrochlorothiazide (HYZAAR) 100-25 MG tablet Take 1 tablet by mouth daily. 01/28/21   Riesa Pope, MD  Melatonin 3 MG TABS Take 3 mg by mouth at bedtime.    [provider]  metFORMIN (GLUCOPHAGE) 500 MG tablet Take 1 tablet (500 mg total) by mouth 2 (two) times daily with a meal. 04/23/20 04/23/21  Santos-Sanchez, Merlene Morse, MD  metoprolol succinate (TOPROL-XL) 25 MG 24 hr tablet Take 1/2 (one-half) tablet by mouth once daily 04/03/20   Hilty, Nadean Corwin, MD  NASONEX 50 MCG/ACT nasal spray PLACE 2 SPRAYS INTO THE NOSE DAILY AS NEEDED. 03/28/20   Aldine Contes, MD  omeprazole (PRILOSEC) 40 MG capsule Take 1 capsule (40 mg total) by mouth 2 (two) times daily. 05/13/21 08/11/21  Angelica Pou, MD  PAZEO 0.7 % SOLN PLACE 1 DROP TO EYE DAILY. 08/15/19   Welford Roche, MD     Family History    Family History  Problem Relation Age of Onset   Hypertension Mother    Emphysema Mother    Cancer Mother        lung   Heart failure Mother        CHF   Heart disease Father        MI at 99   Hypertension Father    Crohn's disease Father    Heart disease Sister        leaky valve   Hypertension Sister    Colon cancer Sister        7 Mets   Skin cancer Maternal Grandmother    Stroke Maternal Grandfather    Diabetes Brother    Liver cancer Brother    Hypertension Sister    Hyperlipidemia Sister    Heart disease Sister        LVAD 2014-MI in her  73s   Hypertension Sister    Crohn's disease Sister    Congestive Heart Failure Sister    Cancer Sister    Hyperlipidemia Sister    Hypertension Child    Hyperlipidemia Child    Hyperlipidemia Child    Pancreatic cancer Neg Hx    Stomach cancer Neg Hx    Esophageal cancer Neg Hx    She indicated that her mother is deceased. She indicated that her father is deceased. She indicated that only one of her five sisters is alive. She indicated that the status of her brother is unknown. She indicated that her maternal grandmother is deceased. She indicated that her maternal grandfather is deceased. She indicated that her paternal grandmother is deceased. She indicated that her paternal grandfather is deceased. She indicated that the status of her neg hx is unknown.  Social History    Social History   Socioeconomic History   Marital status: Married    Spouse name: Not on file   Number of children: 3   Years of education: 12   Highest education level: 12th grade  Occupational History   Not on file  Tobacco Use   Smoking status: Light Smoker    Packs/day: 0.50    Years: 45.00    Pack years: 22.50    Types: Cigarettes   Smokeless tobacco: Never   Tobacco comments:    3 cigs per day  Vaping Use   Vaping Use: Never used  Substance and Sexual Activity   Alcohol use: Yes    Comment: holidays and special  occasion   Drug use: Yes    Frequency: 1.0 times per week    Types: Marijuana    Comment: once weekly or once every 2 weeks   Sexual activity: Yes    Birth control/protection: None  Other Topics Concern   Not on file  Social History Narrative   Epworth Sleepiness Scale (08/21/15) = 9   Social Determinants of Health   Financial Resource Strain: Not on file  Food Insecurity: Not on file  Transportation Needs: Not on file  Physical Activity: Not on file  Stress: Not on file  Social Connections: Not on file  Intimate Partner Violence: Not on file     Review of Systems    General:  No chills, fever, night sweats or weight changes.  Cardiovascular:  No chest pain, dyspnea on exertion, edema, orthopnea, palpitations, paroxysmal nocturnal dyspnea. Dermatological: No rash, lesions/masses Respiratory: No cough, dyspnea Urologic: No hematuria, dysuria Abdominal:   No nausea, vomiting, diarrhea, bright red blood per rectum, melena, or hematemesis Neurologic:  No visual changes, wkns, changes in mental status. All other systems reviewed and are otherwise negative except as noted above.  Physical Exam    VS:  BP 118/72 (BP Location: Left Arm, Patient Position: Sitting, Cuff Size: Large)   Pulse 85   Ht 5\' 7"  (1.702 m)   Wt 231 lb 9.6 oz (105.1 kg)   LMP 02/11/2011 Comment: no chance of pregnancy per pt  BMI 36.27 kg/m  , BMI Body mass index is 36.27 kg/m. GEN: Well nourished, well developed, in no acute distress. HEENT: normal. Neck: Supple, no JVD, carotid bruits, or masses. Cardiac: RRR, no murmurs, rubs, or gallops. No clubbing, cyanosis, edema.  Radials/DP/PT 2+ and equal bilaterally.  Respiratory:  Respirations regular and unlabored, clear to auscultation bilaterally. GI: Soft, nontender, nondistended, BS + x 4. MS: no deformity or atrophy. Skin: warm and dry, no rash. Neuro:  Strength and  sensation are intact. Psych: Normal affect.  Accessory Clinical Findings     Recent Labs: 10/11/2020: ALT 20; BUN 13; Creatinine, Ser 1.11; Potassium 4.3; Sodium 144 03/12/2021: Hemoglobin 13.4; Platelets 410   Recent Lipid Panel    Component Value Date/Time   CHOL 114 03/01/2021 1114   TRIG 62 03/01/2021 1114   HDL 39 (L) 03/01/2021 1114   CHOLHDL 2.9 03/01/2021 1114   CHOLHDL 5.1 10/14/2017 0245   VLDL 24 10/14/2017 0245   LDLCALC 61 03/01/2021 1114    ECG personally reviewed by me today-sinus rhythm with first-degree AV block 85 bpm no ST or T wave deviation- No acute changes  Cardiac catheterization 10/13/2017 Prox RCA lesion is 70% stenosed. Prox RCA to Mid RCA lesion is 85% stenosed. A drug-eluting stent was successfully placed using a STENT SIERRA 3.00 X 38 MM. --Postdilated to 3.5 mm A drug-eluting stent was successfully placed using a STENT SIERRA 3.25 X 08 MM. Overlapping proximally; postdilated to 3.6 mm Post intervention, there is a 0% residual stenosis. Dist RCA lesion is 50% stenosed. RPDA lesion is 60% stenosed. The left ventricular systolic function is normal -hyperdynamic. The left ventricular ejection fraction is greater than 65% by visual estimate. LV end diastolic pressure is normal. There is mild aortic valve stenosis. Peak gradient between 25-30 mmHg   Severe single-vessel disease involving the proximal to mid RCA.  Long area of stenosis covered with a long stent  with a more focal proximal lesion covered with an overlapping stent proximally.   Known mild aortic stenosis.   Plan: Transfer to [facility] procedure [facility] for post PCI care and TR band removal Dual antiplatelet therapy for minimum of 3 months (aspirin plus Plavix, could stop aspirin at 3 months and continue Plavix (would likely continue beyond 1 year given the extent of stented segment) Continue aggressive cardiac risk factor modification with hypertension and lipid control. Anticipate discharge tomorrow from cardiology standpoint         Glenetta Hew,  MD  Diagnostic Dominance: Right    Intervention     Echocardiogram 06/08/2019 IMPRESSIONS     1. The left ventricle has hyperdynamic systolic function, with an  ejection fraction of >65%. The cavity size was normal. Left ventricular  diastolic Doppler parameters are consistent with impaired relaxation.  Indeterminate filling pressures No evidence  of left ventricular regional wall motion abnormalities.   2. The right ventricle has normal systolic function. The cavity was  normal. There is no increase in right ventricular wall thickness. Right  ventricular systolic pressure could not be assessed.   3. The aortic valve is tricuspid. Mild thickening of the aortic valve.  Mild calcification of the aortic valve. Aortic valve regurgitation is mild  by color flow Doppler. Mild stenosis of the aortic valve.   4. The aortic root is normal in size and structure.   5. There is mild dilatation of the ascending aorta measuring 39 mm.   6. When compared to the prior study: 02/05/2017, gradients appear to have  increased mostly due to an increase in cardiac output. Consider anemia,  thyrotoxicosis, infection, etc.   Assessment & Plan   1.  Coronary artery disease-denies chest pain today.  No recent episodes of chest discomfort, arm neck or back discomfort.  Underwent cardiac catheterization with PCI DESx2 11/18 to RCA Continue atorvastatin, ezetimibe, losartan, HCTZ, metoprolol Heart healthy low-sodium diet-salty 6 given Increase physical activity as tolerated   Essential hypertension-BP today 118/72.  Well-controlled at home. Continue, losartan,  HCTZ, metoprolol Heart healthy low-sodium diet-salty 6 given Increase physical activity as tolerated  Hyperlipidemia-03/01/2021: Cholesterol, Total 114; HDL 39; LDL Chol Calc (NIH) 61; Triglycerides 62 Continue atorvastatin, ezetimibe Heart healthy low-sodium high-fiber diet Increase physical activity as tolerated  Ascending aortic  aneurysm-measuring 39 mm on echocardiogram 06/08/2019. Maintain good blood pressure control Repeat echocardiogram  Preoperative cardiac evaluation-endoscopy, 07/15/2021, [facility]   Primary Cardiologist: Pixie Casino, MD  Chart reviewed as part of pre-operative protocol coverage. Given past medical history and time since last visit, based on ACC/AHA guidelines, FAE BLOSSOM would be at acceptable risk for the planned procedure without further cardiovascular testing.   Her Plavix may be held for 5 days prior to her procedure.  Please resume as soon as hemostasis is achieved.  Patient was advised that if she develops new symptoms prior to surgery to contact our office to arrange a follow-up appointment.  He verbalized understanding.  I will route this recommendation to the requesting party via Epic fax function and remove from pre-op pool.  Please call with questions.    Disposition: Follow-up with Dr. Debara Pickett in 12 months.   Jossie Ng. Raiana Pharris NP-C    05/17/2021, 8:36 AM [address] Office [phone] Fax [phone]  Notice: This dictation was prepared with Dragon dictation along with smaller phrase technology. Any transcriptional errors that result from this process are unintentional and may not be corrected upon review.  I spent 13 minutes examining this patient, reviewing medications, and using patient centered shared decision making involving her cardiac care.  Prior to her visit I spent greater than 20 minutes reviewing her past medical history,  medications, and prior cardiac tests.

## 2021-05-17 ENCOUNTER — Other Ambulatory Visit: Payer: Self-pay

## 2021-05-17 ENCOUNTER — Encounter: Payer: Self-pay | Admitting: Student

## 2021-05-17 ENCOUNTER — Ambulatory Visit (INDEPENDENT_AMBULATORY_CARE_PROVIDER_SITE_OTHER): Payer: Medicare HMO | Admitting: General Practice

## 2021-05-17 ENCOUNTER — Encounter: Payer: Self-pay | Admitting: General Practice

## 2021-05-17 VITALS — BP 118/72 | HR 85 | Ht 67.0 in | Wt 231.6 lb

## 2021-05-17 DIAGNOSIS — Z01810 Encounter for preprocedural cardiovascular examination: Secondary | ICD-10-CM

## 2021-05-17 DIAGNOSIS — Z9861 Coronary angioplasty status: Secondary | ICD-10-CM | POA: Diagnosis not present

## 2021-05-17 DIAGNOSIS — E782 Mixed hyperlipidemia: Secondary | ICD-10-CM

## 2021-05-17 DIAGNOSIS — I10 Essential (primary) hypertension: Secondary | ICD-10-CM | POA: Diagnosis not present

## 2021-05-17 DIAGNOSIS — I2510 Atherosclerotic heart disease of native coronary artery without angina pectoris: Secondary | ICD-10-CM | POA: Diagnosis not present

## 2021-05-17 DIAGNOSIS — I712 Thoracic aortic aneurysm, without rupture: Secondary | ICD-10-CM

## 2021-05-17 DIAGNOSIS — I7121 Aneurysm of the ascending aorta, without rupture: Secondary | ICD-10-CM

## 2021-05-17 MED ORDER — METFORMIN HCL 500 MG PO TABS: 500.0000 mg | ORAL_TABLET | Freq: Every day | ORAL | 3 refills | Status: DC

## 2021-05-17 NOTE — Patient Instructions (Signed)
Medication Instructions:  The current medical regimen is effective;  continue present plan and medications as directed. Please refer to the Current Medication list given to you today. *If you need a refill on your cardiac medications before your next appointment, please call your pharmacy*  Lab Work:    NONE     Testing/Procedures:  Echocardiogram - Your physician has requested that you have an echocardiogram. Echocardiography is a painless test that uses sound waves to create images of your heart. It provides your doctor with information about the size and shape of your heart and how well your heart's chambers and valves are working. This procedure takes approximately one hour. There are no restrictions for this procedure. This will be performed at our [facility] location - [address], [address].  Special Instructions USE CAST IRON FOR COOKING-THIS WILL INCREASE IRON CONSUNPTION  PLEASE READ AND FOLLOW IRON RICH FOODS-ATTACHED  PLEASE READ AND FOLLOW SALTY 6-ATTACHED-1,800mg  daily  PLEASE INCREASE PHYSICAL ACTIVITY AS TOLERATED  Follow-Up: Your next appointment:  12 month(s) In Person with K. Mali Hilty, MD OR IF UNAVAILABLE JESSE CLEAVER, FNP-C   Please call our office 2 months in advance to schedule this appointment   At [facility], you and your health needs are our priority.  As part of our continuing mission to provide you with exceptional heart care, we have created designated Provider Care Teams.  These Care Teams include your primary Cardiologist (physician) and Advanced Practice Providers (APPs -  Physician Assistants and Nurse Practitioners) who all work together to provide you with the care you need, when you need it.            6 SALTY THINGS TO AVOID     1,800MG  DAILY     Iron-Rich Diet  Iron is a mineral that helps your body produce hemoglobin. Hemoglobin is a protein in red blood cells that carries oxygen to your body's tissues. Eating too little iron may  cause you to feel weak and tired, and it can increase your risk of infection. Iron is naturally found in many foods, and many foods have iron added to them (are iron-fortified). You may need to follow an iron-rich diet if you do not have enough iron in your body due to certain medical conditions. The amount of iron that you need each day depends on your age, your sex, and any medical conditions you have. Follow instructions from your health care provider or a dietitian about how much ironyou should eat each day. What are tips for following this plan? Reading food labels Check food labels to see how many milligrams (mg) of iron are in each serving. Cooking Cook foods in pots and pans that are made from iron. Take these steps to make it easier for your body to absorb iron from certain foods: Soak beans overnight before cooking. Soak whole grains overnight and drain them before using. Ferment flours before baking, such as by using yeast in bread dough. Meal planning When you eat foods that contain iron, you should eat them with foods that are high in vitamin C. These include oranges, peppers, tomatoes, potatoes, and mangoes. Vitamin C helps your body absorb iron. Certain foods and drinks prevent your body from absorbing iron properly. Avoid eating these foods in the same meal as iron-rich foods or with iron supplements. These foods include: Coffee, black tea, and red wine. Milk, dairy products, and foods that are high in calcium. Beans and soybeans. Whole grains. General information Take  iron supplements only as told by your health care provider. An overdose of iron can be life-threatening. If you were prescribed iron supplements, take them with orange juice or a vitamin C supplement. When you eat iron-fortified foods or take an iron supplement, you should also eat foods that naturally contain iron, such as meat, poultry, and fish. Eating naturally iron-rich foods helps your body absorb the iron  that is added to other foods or contained in a supplement. Iron from animal sources is better absorbed than iron from plant sources. What foods should I eat? Fruits Prunes. Raisins. Eat fruits high in vitamin C, such as oranges, grapefruits, and strawberries,with iron-rich foods. Vegetables Spinach (cooked). Green peas. Broccoli. Fermented vegetables. Eat vegetables high in vitamin C, such as leafy greens, potatoes, bell peppers,and tomatoes, with iron-rich foods. Grains Iron-fortified breakfast cereal. Iron-fortified whole-wheat bread. Enrichedrice. Sprouted grains. Meats and other proteins Beef liver. Beef. [place]. Chicken. Oysters. Shrimp. [place]. Sardines. Chickpeas.Nuts. Tofu. Pumpkin seeds. Beverages Tomato juice. Fresh orange juice. Prune juice. Hibiscus tea. Iron-fortifiedinstant breakfast shakes. Sweets and desserts Blackstrap molasses. Seasonings and condiments Tahini. Fermented soy sauce. Other foods Wheat germ. The items listed above may not be a complete list of recommended foods and beverages. Contact a dietitian for more information. What foods should I limit? These are foods that should be limited while eating iron-rich foods as they canreduce the absorption of iron in your body. Grains Whole grains. Bran cereal. Bran flour. Meats and other proteins Soybeans. Products made from soy protein. Black beans. Lentils. Mung beans.Split peas. Dairy Milk. Cream. Cheese. Yogurt. Cottage cheese. Beverages Coffee. Black tea. Red wine. Sweets and desserts Cocoa. Chocolate. Ice cream. Seasonings and condiments Basil. Oregano. Large amounts of parsley. The items listed above may not be a complete list of foods and beverages you should limit. Contact a dietitian for more information. Summary Iron is a mineral that helps your body produce hemoglobin. Hemoglobin is a protein in red blood cells that carries oxygen to your body's tissues. Iron is naturally found in many foods, and  many foods have iron added to them (are iron-fortified). When you eat foods that contain iron, you should eat them with foods that are high in vitamin C. Vitamin C helps your body absorb iron. Certain foods and drinks prevent your body from absorbing iron properly, such as whole grains and dairy products. You should avoid eating these foods in the same meal as iron-rich foods or with iron supplements. This information is not intended to replace advice given to you by your health care provider. Make sure you discuss any questions you have with your healthcare provider. Document Revised: 10/29/2020 Document Reviewed: 10/29/2020 Elsevier Patient Education  2022 [employer].

## 2021-05-17 NOTE — Progress Notes (Signed)
Yes - ok to hold Plavix 5 days prior to endoscopy and restart after.  Dr Lemmie Evens

## 2021-05-17 NOTE — Telephone Encounter (Signed)
metFORMIN (GLUCOPHAGE) 500 MG tablet, REFILL REQUEST @ [facility], [place] Phone:  [phone]  Fax:  [phone]    Per patient the pharmacy is waiting for reply from the office.

## 2021-05-23 ENCOUNTER — Other Ambulatory Visit: Payer: Self-pay

## 2021-05-23 MED ORDER — AMITRIPTYLINE HCL 25 MG PO TABS: 50.0000 mg | ORAL_TABLET | Freq: Every day | ORAL | 0 refills | Status: DC

## 2021-05-23 NOTE — Telephone Encounter (Signed)
Indication not clear from glance at chart, she is new to me. Will fill for 12M but must see me/PCP before refills to ensure there is an ongoing indication.

## 2021-06-04 ENCOUNTER — Encounter: Payer: Self-pay | Admitting: *Deleted

## 2021-06-11 ENCOUNTER — Telehealth: Payer: Self-pay | Admitting: Internal Medicine

## 2021-06-11 ENCOUNTER — Other Ambulatory Visit: Payer: Self-pay | Admitting: Internal Medicine

## 2021-06-11 NOTE — Telephone Encounter (Signed)
Pharmacy is listed as [facility]. Will wait on fax to refill request meds

## 2021-06-11 NOTE — Telephone Encounter (Signed)
Patient was calling to let us know that she has [facility] and they are supposed fax our office to do refill for the patient

## 2021-06-11 NOTE — Telephone Encounter (Signed)
Return pt's call - stated she will changing to [facility] and they will be sending a fax. Change on pt's chart.

## 2021-06-11 NOTE — Telephone Encounter (Signed)
The patient is requesting a call back about a change in Pharmacy.  Please call the patient back.

## 2021-06-12 ENCOUNTER — Other Ambulatory Visit: Payer: Self-pay | Admitting: *Deleted

## 2021-06-12 ENCOUNTER — Other Ambulatory Visit: Payer: Self-pay

## 2021-06-12 ENCOUNTER — Ambulatory Visit (HOSPITAL_COMMUNITY): Payer: Medicare HMO | Attending: Cardiology

## 2021-06-12 DIAGNOSIS — I712 Thoracic aortic aneurysm, without rupture: Secondary | ICD-10-CM | POA: Diagnosis not present

## 2021-06-12 DIAGNOSIS — I7121 Aneurysm of the ascending aorta, without rupture: Secondary | ICD-10-CM

## 2021-06-12 DIAGNOSIS — K219 Gastro-esophageal reflux disease without esophagitis: Secondary | ICD-10-CM

## 2021-06-12 DIAGNOSIS — F32A Depression, unspecified: Secondary | ICD-10-CM

## 2021-06-12 LAB — ECHOCARDIOGRAM COMPLETE
AR max vel: 1.95 cm2
AV Area VTI: 2.09 cm2
AV Area mean vel: 1.97 cm2
AV Mean grad: 15 mmHg
AV Peak grad: 29.2 mmHg
Ao pk vel: 2.7 m/s
Area-P 1/2: 3.54 cm2
P 1/2 time: 309 msec
S' Lateral: 3.35 cm

## 2021-06-12 MED ORDER — METFORMIN HCL 500 MG PO TABS: 500.0000 mg | ORAL_TABLET | Freq: Every day | ORAL | 3 refills | Status: DC

## 2021-06-12 MED ORDER — METOPROLOL SUCCINATE ER 25 MG PO TB24: 12.5000 mg | ORAL_TABLET | Freq: Every day | ORAL | 3 refills | Status: DC

## 2021-06-12 MED ORDER — OMEPRAZOLE 40 MG PO CPDR: 40.0000 mg | DELAYED_RELEASE_CAPSULE | Freq: Two times a day (BID) | ORAL | 2 refills | Status: DC

## 2021-06-12 MED ORDER — LEVOTHYROXINE SODIUM 100 MCG PO TABS: ORAL_TABLET | ORAL | 3 refills | Status: DC

## 2021-06-12 MED ORDER — CETIRIZINE HCL 10 MG PO TABS: ORAL_TABLET | ORAL | 3 refills | Status: DC

## 2021-06-12 MED ORDER — BUPROPION HCL ER (SR) 150 MG PO TB12: ORAL_TABLET | ORAL | 5 refills | Status: DC

## 2021-06-12 MED ORDER — MOMETASONE FUROATE 50 MCG/ACT NA SUSP: NASAL | 1 refills | Status: DC

## 2021-06-12 MED ORDER — LOSARTAN POTASSIUM-HCTZ 100-25 MG PO TABS: 1.0000 | ORAL_TABLET | Freq: Every day | ORAL | 3 refills | Status: DC

## 2021-06-14 ENCOUNTER — Telehealth: Payer: Self-pay | Admitting: Internal Medicine

## 2021-06-14 ENCOUNTER — Other Ambulatory Visit: Payer: Self-pay

## 2021-06-14 MED ORDER — ATORVASTATIN CALCIUM 40 MG PO TABS: ORAL_TABLET | ORAL | 3 refills | Status: DC

## 2021-06-14 MED ORDER — CLOPIDOGREL BISULFATE 75 MG PO TABS: 75.0000 mg | ORAL_TABLET | Freq: Every day | ORAL | 3 refills | Status: DC

## 2021-06-14 MED ORDER — EZETIMIBE 10 MG PO TABS: 10.0000 mg | ORAL_TABLET | Freq: Every day | ORAL | 3 refills | Status: DC

## 2021-06-14 NOTE — Addendum Note (Signed)
Addended by: Alvin Critchley on: 06/14/2021 12:10 PM   Modules accepted: Orders

## 2021-06-14 NOTE — Telephone Encounter (Signed)
*  STAT* If patient is at the pharmacy, call can be transferred to refill team.   1. Which medications need to be refilled? (please list name of each medication and dose if known)  amitriptyline (ELAVIL) 25 MG tablet] atorvastatin (LIPITOR) 40 MG tablet clopidogrel (PLAVIX) 75 MG tablet  ezetimibe (ZETIA) 10 MG tablet  2. Which pharmacy/location (including street and city if local pharmacy) is medication to be sent to? [facility] ([facility] - [place], [address]   3. Do they need a 30 day or 90 day supply? 90 day supply

## 2021-07-01 ENCOUNTER — Other Ambulatory Visit: Payer: Self-pay | Admitting: Internal Medicine

## 2021-07-01 DIAGNOSIS — Z1231 Encounter for screening mammogram for malignant neoplasm of breast: Secondary | ICD-10-CM

## 2021-07-03 ENCOUNTER — Ambulatory Visit
Admission: RE | Admit: 2021-07-03 | Discharge: 2021-07-03 | Disposition: A | Discharge status: Home / Self Care | Payer: Medicare HMO | Source: Ambulatory Visit | Attending: Internal Medicine | Admitting: Internal Medicine

## 2021-07-03 ENCOUNTER — Other Ambulatory Visit: Payer: Self-pay

## 2021-07-03 DIAGNOSIS — Z1231 Encounter for screening mammogram for malignant neoplasm of breast: Secondary | ICD-10-CM

## 2021-07-09 ENCOUNTER — Telehealth: Payer: Self-pay | Admitting: Gastroenterology

## 2021-07-09 ENCOUNTER — Other Ambulatory Visit: Payer: Self-pay

## 2021-07-09 MED ORDER — AMITRIPTYLINE HCL 25 MG PO TABS: 50.0000 mg | ORAL_TABLET | Freq: Every day | ORAL | 0 refills | Status: DC

## 2021-07-09 NOTE — Telephone Encounter (Signed)
Haven't met patient yet; appt later this month.  Will determine ongoing indication for TCA and if necessary will provide 43M refills at that time.

## 2021-07-09 NOTE — Telephone Encounter (Signed)
amitriptyline (ELAVIL) 25 MG tablet, REFILL REQUEST @ [facility] ([facility]) - [place], [place].

## 2021-07-09 NOTE — Telephone Encounter (Signed)
Returned patients call to let her know that she can hold her plavix 5 days prior to her procedure per  Coletta Memos, NP office note 05/17/21.

## 2021-07-09 NOTE — Telephone Encounter (Signed)
Inbound call from patient. Have endo/colon scheduled 8/15. Wants to know when to stop taking Plavix.

## 2021-07-11 ENCOUNTER — Telehealth: Payer: Self-pay | Admitting: *Deleted

## 2021-07-11 NOTE — Telephone Encounter (Signed)
Information was sent through [place] for PA for [facility].  PA was approved 12/01/2020 thru 11/30/2021 AM.  Sander Nephew, RN 07/11/2021 10:08 AM.

## 2021-07-15 ENCOUNTER — Other Ambulatory Visit: Payer: Self-pay

## 2021-07-15 ENCOUNTER — Ambulatory Visit (AMBULATORY_SURGERY_CENTER): Payer: Medicare HMO | Admitting: Gastroenterology

## 2021-07-15 ENCOUNTER — Encounter: Payer: Self-pay | Admitting: Gastroenterology

## 2021-07-15 VITALS — BP 141/82 | HR 73 | Temp 96.8°F | Resp 15 | Ht 67.0 in | Wt 236.0 lb

## 2021-07-15 DIAGNOSIS — Z86010 Personal history of colonic polyps: Secondary | ICD-10-CM | POA: Diagnosis not present

## 2021-07-15 DIAGNOSIS — D123 Benign neoplasm of transverse colon: Secondary | ICD-10-CM | POA: Diagnosis not present

## 2021-07-15 DIAGNOSIS — D120 Benign neoplasm of cecum: Secondary | ICD-10-CM

## 2021-07-15 DIAGNOSIS — K219 Gastro-esophageal reflux disease without esophagitis: Secondary | ICD-10-CM | POA: Diagnosis not present

## 2021-07-15 DIAGNOSIS — I2510 Atherosclerotic heart disease of native coronary artery without angina pectoris: Secondary | ICD-10-CM | POA: Diagnosis not present

## 2021-07-15 DIAGNOSIS — E611 Iron deficiency: Secondary | ICD-10-CM | POA: Diagnosis not present

## 2021-07-15 DIAGNOSIS — F329 Major depressive disorder, single episode, unspecified: Secondary | ICD-10-CM | POA: Diagnosis not present

## 2021-07-15 MED ORDER — SODIUM CHLORIDE 0.9 % IV SOLN: 500.0000 mL | Freq: Once | INTRAVENOUS | Status: DC

## 2021-07-15 NOTE — Op Note (Addendum)
[facility] Patient Name: Emma Bonilla Procedure Date: 07/15/2021 2:29 PM MRN: [id number] Endoscopist: Remo Lipps P. Havery Moros , MD Age: 65 Referring MD:  Date of Birth: 03/29/1956 Gender: Female Account #: [id number] Procedure:                Colonoscopy Indications:              Iron deficiency (low iron levels but normal Hgb),                            on Plavix, history of colon polyps in 2018, strong                            FH of colon cancer in sister (diagnosed age 62s). Medicines:                Monitored Anesthesia Care Procedure:                Pre-Anesthesia Assessment:                           - Prior to the procedure, a History and Physical                            was performed, and patient medications and                            allergies were reviewed. The patient's tolerance of                            previous anesthesia was also reviewed. The risks                            and benefits of the procedure and the sedation                            options and risks were discussed with the patient.                            All questions were answered, and informed consent                            was obtained. Prior Anticoagulants: The patient has                            taken Plavix (clopidogrel), last dose was 5 days                            prior to procedure. ASA Grade Assessment: III - A                            patient with severe systemic disease. After                            reviewing the risks and benefits, the patient was  deemed in satisfactory condition to undergo the                            procedure.                           After obtaining informed consent, the colonoscope                            was passed under direct vision. Throughout the                            procedure, the patient's blood pressure, pulse, and                            oxygen saturations were monitored  continuously. The                            CF [id number] was introduced through the anus                            and advanced to the the terminal ileum, with                            identification of the appendiceal orifice and IC                            valve. The colonoscopy was performed without                            difficulty. The patient tolerated the procedure                            well. The quality of the bowel preparation was                            adequate. The terminal ileum, ileocecal valve,                            appendiceal orifice, and rectum were photographed. Scope In: 2:51:31 PM Scope Out: 3:10:23 PM Scope Withdrawal Time: 0 hours 15 minutes 4 seconds  Total Procedure Duration: 0 hours 18 minutes 52 seconds  Findings:                 The perianal and digital rectal examinations were                            normal.                           The terminal ileum appeared normal.                           A 3 to 4 mm polyp was found in the ileocecal valve.  The polyp was sessile. The polyp was removed with a                            cold snare. Resection and retrieval were complete.                           A 3 mm polyp was found in the hepatic flexure. The                            polyp was sessile. The polyp was removed with a                            cold snare. Resection and retrieval were complete.                           Internal hemorrhoids were found during                            retroflexion. The hemorrhoids were moderate in                            size. Stigmata of prior banding noted..                           The exam was otherwise without abnormality. Of                            note, there was some residual seed burden in the                            cecum, they could not be removed but lavaged and                            nothing noted in the area. Complications:             No immediate complications. Estimated blood loss:                            Minimal. Estimated Blood Loss:     Estimated blood loss was minimal. Impression:               - The examined portion of the ileum was normal.                           - One 3 to 4 mm polyp at the ileocecal valve,                            removed with a cold snare. Resected and retrieved.                           - One 3 mm polyp at the hepatic flexure, removed  with a cold snare. Resected and retrieved.                           - Internal hemorrhoids with stigmata of prior                            banding.                           - The examination was otherwise normal.                           No obvious cause for iron deficiency noted on                            colonoscopy. Recommendation:           - Patient has a contact number available for                            emergencies. The signs and symptoms of potential                            delayed complications were discussed with the                            patient. Return to normal activities tomorrow.                            Written discharge instructions were provided to the                            patient.                           - Resume previous diet.                           - Continue present medications.                           - Resume Plavix tomorrow                           - Await pathology results.                           - EGD was scheduled for today however the patient                            has 2 loose front teeth, EGD was cancelled today in                            this light. She will see the dentist next month and                            call  to reschedule EGD to further evaluate iron                            deficiency andGERD Remo Lipps P. Shahil Speegle, MD 07/15/2021 3:17:37 PM This report has been signed electronically.

## 2021-07-15 NOTE — Patient Instructions (Addendum)
RESUME PLAVIX ( YOUR BLOOD THINNER ) TOMORROW ,07/16/21  Handouts on polyps & hemorrhoids given to you today  Await patholog results on polyps removed      YOU HAD AN ENDOSCOPIC PROCEDURE TODAY AT [facility]:   Refer to the procedure report that was given to you for any specific questions about what was found during the examination.  If the procedure report does not answer your questions, please call your gastroenterologist to clarify.  If you requested that your care partner not be given the details of your procedure findings, then the procedure report has been included in a sealed envelope for you to review at your convenience later.  YOU SHOULD EXPECT: Some feelings of bloating in the abdomen. Passage of more gas than usual.  Walking can help get rid of the air that was put into your GI tract during the procedure and reduce the bloating. If you had a lower endoscopy (such as a colonoscopy or flexible sigmoidoscopy) you may notice spotting of blood in your stool or on the toilet paper. If you underwent a bowel prep for your procedure, you may not have a normal bowel movement for a few days.  Please Note:  You might notice some irritation and congestion in your nose or some drainage.  This is from the oxygen used during your procedure.  There is no need for concern and it should clear up in a day or so.  SYMPTOMS TO REPORT IMMEDIATELY:  Following lower endoscopy (colonoscopy or flexible sigmoidoscopy):  Excessive amounts of blood in the stool  Significant tenderness or worsening of abdominal pains  Swelling of the abdomen that is new, acute  Fever of 100F or higher   For urgent or emergent issues, a gastroenterologist can be reached at any hour by calling [phone]. Do not use MyChart messaging for urgent concerns.    DIET:  We do recommend a small meal at first, but then you may proceed to your regular diet.  Drink plenty of fluids but you should avoid  alcoholic beverages for 24 hours.  ACTIVITY:  You should plan to take it easy for the rest of today and you should NOT DRIVE or use heavy machinery until tomorrow (because of the sedation medicines used during the test).    FOLLOW UP: Our staff will call the number listed on your records 48-72 hours following your procedure to check on you and address any questions or concerns that you may have regarding the information given to you following your procedure. If we do not reach you, we will leave a message.  We will attempt to reach you two times.  During this call, we will ask if you have developed any symptoms of COVID 19. If you develop any symptoms (ie: fever, flu-like symptoms, shortness of breath, cough etc.) before then, please call [phone].  If you test positive for Covid 19 in the 2 weeks post procedure, please call and report this information to [place].    If any biopsies were taken you will be contacted by phone or by letter within the next 1-3 weeks.  Please call us at [phone] if you have not heard about the biopsies in 3 weeks.    SIGNATURES/CONFIDENTIALITY: You and/or your care partner have signed paperwork which will be entered into your electronic medical record.  These signatures attest to the fact that that the information above on your After Visit Summary has been reviewed and is understood.  Full responsibility  of the confidentiality of this discharge information lies with you and/or your care-partner.

## 2021-07-15 NOTE — Progress Notes (Signed)
UPPER ENDO Cancelled by CRNA & Dr Havery Moros due to pt's loose teeth.Pt to call and reschedule Upper Endo after dental work done as Dr Havery Moros ordered .

## 2021-07-15 NOTE — Progress Notes (Signed)
To PACU, VSS. Report to [email or web address].tb 

## 2021-07-15 NOTE — Progress Notes (Signed)
HPI :  65 y/o female with a history of CAD with stent in place, on Plavix - here for EGD and colonoscopy to evaluate the following: - iron deficiency (normal hemoglobin) - history of colon polyps / strong FH of colon cancer - GERD  She has been off PLavix 5 days. She denies any cardiopulmonary symptoms. On oral exam she has 2 loose teeth in the front bottom, she plans to see a dentist next month. We discussed this prior to the procedure and cancelled EGD Due to this issue. She wishes to proceed with the colonoscopy. Otherwise feels okay without complaints.   Past Medical History:  Diagnosis Date   Anemia    Arthritis    Depression    Diastolic dysfunction without heart failure    GERD (gastroesophageal reflux disease)    Hepatic steatosis    a. seen on prior US.   Hyperlipidemia    Hypertension    Mild aortic regurgitation    Mild aortic stenosis    Premature atrial contractions    PVC's (premature ventricular contractions)    Thyroid disease    TIA (transient ischemic attack)    2010   Tobacco abuse      Past Surgical History:  Procedure Laterality Date   CORONARY STENT INTERVENTION N/A 10/13/2017   Procedure: CORONARY STENT INTERVENTION;  Surgeon: Leonie Man, MD;  Location: [facility];  Service: Cardiovascular;  Laterality: N/A;   DILATION AND CURETTAGE OF UTERUS     30 years ago   FOOT SURGERY     LEFT HEART CATH AND CORONARY ANGIOGRAPHY N/A 10/13/2017   Procedure: LEFT HEART CATH AND CORONARY ANGIOGRAPHY;  Surgeon: Leonie Man, MD;  Location: [facility];  Service: Cardiovascular;  Laterality: N/A;   Family History  Problem Relation Age of Onset   Hypertension Mother    Emphysema Mother    Cancer Mother        lung   Heart failure Mother        CHF   Heart disease Father        MI at 50   Hypertension Father    Crohn's disease Father    Heart disease Sister        leaky valve   Hypertension Sister    Colon cancer Sister         39 Mets   Skin cancer Maternal Grandmother    Stroke Maternal Grandfather    Diabetes Brother    Liver cancer Brother    Hypertension Sister    Hyperlipidemia Sister    Heart disease Sister        LVAD 2014-MI in her 57s   Hypertension Sister    Crohn's disease Sister    Congestive Heart Failure Sister    Cancer Sister    Hyperlipidemia Sister    Hypertension Child    Hyperlipidemia Child    Hyperlipidemia Child    Pancreatic cancer Neg Hx    Stomach cancer Neg Hx    Esophageal cancer Neg Hx    Social History   Tobacco Use   Smoking status: Light Smoker    Packs/day: 0.50    Years: 45.00    Pack years: 22.50    Types: Cigarettes   Smokeless tobacco: Never   Tobacco comments:    3 cigs per day  Vaping Use   Vaping Use: Never used  Substance Use Topics   Alcohol use: Yes    Comment: holidays and special  occasion   Drug use: Yes    Frequency: 1.0 times per week    Types: Marijuana    Comment: once weekly or once every 2 weeks   Current Outpatient Medications  Medication Sig Dispense Refill   amitriptyline (ELAVIL) 25 MG tablet Take 2 tablets (50 mg total) by mouth at bedtime. 90 tablet 0   atorvastatin (LIPITOR) 40 MG tablet TAKE 1 TABLET BY MOUTH ONCE DAILY AT 6 PM 90 tablet 3   buPROPion (WELLBUTRIN SR) 150 MG 12 hr tablet TAKE 1 TABLET ('150MG'$  TOTAL) BY MOUTH 2 TIMES A DAY 60 tablet 5   calcium-vitamin D (OSCAL WITH D) 500-200 MG-UNIT per tablet Take 1 tablet by mouth daily. 30 tablet 3   cetirizine (ZYRTEC) 10 MG tablet TAKE 1 TABLET ('10MG'$  TOTAL) BY MOUTH DAILY 90 tablet 3   clopidogrel (PLAVIX) 75 MG tablet Take 1 tablet (75 mg total) by mouth daily with breakfast. 90 tablet 3   ezetimibe (ZETIA) 10 MG tablet Take 1 tablet (10 mg total) by mouth daily. 90 tablet 3   ferrous sulfate 325 (65 FE) MG tablet Take 1 tablet (325 mg total) by mouth daily. 30 tablet 3   levothyroxine (SYNTHROID) 100 MCG tablet TAKE 1 TABLET (100MCG TOTAL) BY MOUTH DAILY. 90 tablet 3    losartan-hydrochlorothiazide (HYZAAR) 100-25 MG tablet Take 1 tablet by mouth daily. 90 tablet 3   Melatonin 3 MG TABS Take 3 mg by mouth at bedtime.     metFORMIN (GLUCOPHAGE) 500 MG tablet Take 1 tablet (500 mg total) by mouth daily. 90 tablet 3   metoprolol succinate (TOPROL-XL) 25 MG 24 hr tablet Take 0.5 tablets (12.5 mg total) by mouth daily. 90 tablet 3   omeprazole (PRILOSEC) 40 MG capsule Take 1 capsule (40 mg total) by mouth 2 (two) times daily. 180 capsule 2   mometasone (NASONEX) 50 MCG/ACT nasal spray PLACE 2 SPRAYS INTO THE NOSE DAILY AS NEEDED. 17 g 1   PAZEO 0.7 % SOLN PLACE 1 DROP TO EYE DAILY. 7.5 mL 0   Current Facility-Administered Medications  Medication Dose Route Frequency Provider Last Rate Last Admin   0.9 %  sodium chloride infusion  500 mL Intravenous Once Jaquasha Carnevale, Carlota Raspberry, MD       Allergies  Allergen Reactions   Naproxen Sodium Rash     Review of Systems: All systems reviewed and negative except where noted in HPI.    MM 3D SCREEN BREAST BILATERAL  Result Date: 07/06/2021 CLINICAL DATA:  Screening. EXAM: DIGITAL SCREENING BILATERAL MAMMOGRAM WITH TOMOSYNTHESIS AND CAD TECHNIQUE: Bilateral screening digital craniocaudal and mediolateral oblique mammograms were obtained. Bilateral screening digital breast tomosynthesis was performed. The images were evaluated with computer-aided detection. COMPARISON:  Previous exam(s). ACR Breast Density Category a: The breast tissue is almost entirely fatty. FINDINGS: There are no findings suspicious for malignancy. IMPRESSION: No mammographic evidence of malignancy. A result letter of this screening mammogram will be mailed directly to the patient. RECOMMENDATION: Screening mammogram in one year. (Code:SM-B-01Y) BI-RADS CATEGORY  1: Negative. Electronically Signed   By: Ammie Ferrier M.D.   On: 07/06/2021 08:32    Physical Exam: BP (!) 104/54   Pulse 98   Temp (!) 96.8 F (36 C)   Ht '5\' 7"'$  (1.702 m)   Wt 236 lb  (107 kg)   LMP 02/11/2011 Comment: no chance of pregnancy per pt  SpO2 97%   BMI 36.96 kg/m  Constitutional: Pleasant,well-developed, female in no acute distress. Cardiovascular: Normal rate,  regular rhythm.  Pulmonary/chest: Effort normal and breath sounds normal.  Abdominal: Soft, nondistended, nontender.  Psychiatric: Normal mood and affect. Behavior is normal.   ASSESSMENT AND PLAN: 65 y/o female with a history of CAD on PLavix, history of IDA, colon polyps, GERD, here for EGD and colonoscopy today. She has 2 loose front teeth on the lower side, seeing dentist next month. Discussed with CRNA and will plan on holding off on EGD today given this issue, needs to have her teeth fixed prior to safely doing upper endoscopy. This issue has been going on for months per her report. Otherwise will plan on doing her colonoscopy today. Discussed risks / benefits and she wishes to proceed, further recommendations pending the results.  Jolly Mango, MD [facility]   Jeralyn Bennett, MD

## 2021-07-15 NOTE — Progress Notes (Signed)
VS by DT  Pt's states no medical or surgical changes since previsit or office visit.  

## 2021-07-17 ENCOUNTER — Telehealth: Payer: Self-pay

## 2021-07-17 NOTE — Telephone Encounter (Signed)
Second follow up call attempt, no answer, unable to LM

## 2021-07-17 NOTE — Telephone Encounter (Signed)
No voicemail set up so unable to leave message on follow up call.

## 2021-07-25 ENCOUNTER — Encounter: Payer: Medicare HMO | Admitting: Internal Medicine

## 2021-07-30 ENCOUNTER — Encounter: Payer: Self-pay | Admitting: Internal Medicine

## 2021-08-21 ENCOUNTER — Other Ambulatory Visit: Payer: Self-pay

## 2021-08-21 MED ORDER — AMITRIPTYLINE HCL 25 MG PO TABS: 50.0000 mg | ORAL_TABLET | Freq: Every day | ORAL | 3 refills | Status: DC

## 2021-08-26 ENCOUNTER — Telehealth: Payer: Self-pay | Admitting: Dietician

## 2021-08-26 NOTE — Telephone Encounter (Signed)
Fax request received for diabetes testing supplies on behalf of this patient from mail order pharmacy. Patient has prediabetes on her problem list, not diabetes, is on metformin and  Lab Results  Component Value Date   HGBA1C 6.1 (A) 03/01/2021   HGBA1C 6.7 (A) 04/23/2020   HGBA1C 6.0 (A) 06/20/2019   HGBA1C 5.8 07/20/2017   HGBA1C 5.6 04/10/2014   Unsure if diabetes testing supplies are covered with her current diagnosis and if testing recommended by physician. Her last office visit here was 03/12/21. Please advise. Thank you!

## 2021-10-17 ENCOUNTER — Telehealth: Payer: Self-pay

## 2021-10-17 DIAGNOSIS — R6889 Other general symptoms and signs: Secondary | ICD-10-CM | POA: Diagnosis not present

## 2021-10-17 NOTE — Telephone Encounter (Signed)
   [facility] Pre-operative Risk Assessment    Patient Name: Emma Bonilla  DOB: 11/03/1956 MRN: [id number]  HEARTCARE STAFF:  - IMPORTANT!!!!!! Under Visit Info/Reason for Call, type in Other and utilize the format Clearance MM/DD/YY or Clearance TBD. Do not use dashes or single digits. - Please review there is not already an duplicate clearance open for this procedure. - If request is for dental extraction, please clarify the # of teeth to be extracted. - If the patient is currently at the dentist's office, call Pre-Op Callback Staff (MA/nurse) to input urgent request.  - If the patient is not currently in the dentist office, please route to the Pre-Op pool.  Request for surgical clearance:  What type of surgery is being performed? Extraction 12 teeth's  When is this surgery scheduled? TBD  What type of clearance is required (medical clearance vs. Pharmacy clearance to hold med vs. Both)? Medical   Are there any medications that need to be held prior to surgery and how long? Plavix   Practice name and name of physician performing surgery? [facility]   What is the office phone number? [phone]   7.   What is the office fax number? [phone]  8.   Anesthesia type (None, local, MAC, general) ? Local    Nino Amano L Mylon Mabey 10/17/2021, 4:13 PM  _________________________________________________________________   (provider comments below)

## 2021-10-17 NOTE — Telephone Encounter (Signed)
   Primary Cardiologist: Pixie Casino, MD  Chart reviewed as part of pre-operative protocol coverage. Given past medical history and time since last visit, based on ACC/AHA guidelines, Emma Bonilla would be at acceptable risk for the planned procedure without further cardiovascular testing.   Her Plavix may be held for 5 days prior to her procedure.  Please resume as soon as hemostasis is achieved.  I will route this recommendation to the requesting party via Epic fax function and remove from pre-op pool.  Please call with questions.  Emma Bonilla. Emma Guida NP-C    10/17/2021, 4:25 PM [address] Office [phone] Fax [phone]

## 2021-11-07 ENCOUNTER — Ambulatory Visit (INDEPENDENT_AMBULATORY_CARE_PROVIDER_SITE_OTHER): Payer: Medicare HMO | Admitting: Internal Medicine

## 2021-11-07 VITALS — BP 127/66 | HR 97 | Temp 98.4°F | Ht 67.0 in | Wt 237.0 lb

## 2021-11-07 DIAGNOSIS — K760 Fatty (change of) liver, not elsewhere classified: Secondary | ICD-10-CM

## 2021-11-07 DIAGNOSIS — Z23 Encounter for immunization: Secondary | ICD-10-CM

## 2021-11-07 DIAGNOSIS — I10 Essential (primary) hypertension: Secondary | ICD-10-CM

## 2021-11-07 DIAGNOSIS — R7303 Prediabetes: Secondary | ICD-10-CM

## 2021-11-07 DIAGNOSIS — K029 Dental caries, unspecified: Secondary | ICD-10-CM

## 2021-11-07 DIAGNOSIS — Z9861 Coronary angioplasty status: Secondary | ICD-10-CM

## 2021-11-07 DIAGNOSIS — E039 Hypothyroidism, unspecified: Secondary | ICD-10-CM | POA: Diagnosis not present

## 2021-11-07 DIAGNOSIS — I350 Nonrheumatic aortic (valve) stenosis: Secondary | ICD-10-CM | POA: Diagnosis not present

## 2021-11-07 DIAGNOSIS — I2510 Atherosclerotic heart disease of native coronary artery without angina pectoris: Secondary | ICD-10-CM | POA: Diagnosis not present

## 2021-11-07 DIAGNOSIS — E611 Iron deficiency: Secondary | ICD-10-CM | POA: Diagnosis not present

## 2021-11-07 DIAGNOSIS — K219 Gastro-esophageal reflux disease without esophagitis: Secondary | ICD-10-CM

## 2021-11-07 DIAGNOSIS — G4733 Obstructive sleep apnea (adult) (pediatric): Secondary | ICD-10-CM

## 2021-11-07 DIAGNOSIS — R682 Dry mouth, unspecified: Secondary | ICD-10-CM

## 2021-11-07 DIAGNOSIS — Z720 Tobacco use: Secondary | ICD-10-CM

## 2021-11-07 DIAGNOSIS — R6889 Other general symptoms and signs: Secondary | ICD-10-CM | POA: Diagnosis not present

## 2021-11-07 DIAGNOSIS — S83003D Unspecified subluxation of unspecified patella, subsequent encounter: Secondary | ICD-10-CM

## 2021-11-07 DIAGNOSIS — R2689 Other abnormalities of gait and mobility: Secondary | ICD-10-CM

## 2021-11-07 DIAGNOSIS — D500 Iron deficiency anemia secondary to blood loss (chronic): Secondary | ICD-10-CM

## 2021-11-07 NOTE — Progress Notes (Signed)
Emma Bonilla is here to meet new PCP (me) and to review her medical conditions.  She raised the following concerns:  Hx of GIB - no melena in bowels (none since 20 years, found ulcers, stress related.) Received outpatient iron infusion a few months ago, felt a little better.  Continues on PPI. GI f/u planned- couldn't do EGD due to loose tooth.  Awaiting tooth extraction.  She has poor dentition and painful chewing/biting; has been advised that all teeth will need to be removed. Hasn't made f/u visit yet.  Emma Bonilla inquired whether there was such thing as a borderline diabetic? Yes, prediabetes- borderline.  She is being treated for this with metformin.  Concerned about daily multiple episodes of sweating, happens in flashes, but she does not feel hot (not like her former post-menopausal hot flashes).Usually affects face and back. STarted in past few months.  Does not feel faint or dizzy during episodes, not dypsneic. Spells last several minutes.    She is concerned that she may have a balance problem.  R hip nagging pain, feels it give out.  Happens when she stands, feels weak on one side. 3 recent falls - one when tripped on dog leash outside; had to crawl to house and call husbands name to assist her.  The other falls occurred inside, when she stands and begins walking and putting things away.  Has chronic intermittent pain in LBP.  LBP intensity worse than hip, though she doesn't feel it is contributing to balance problem.  Sleep pattern is normal, regular.    Sleeps through the night. Goes to sleep late, loves to read.  Amitript - started for sleep some time ago and for headache prevention, then increased for depression.  Her symptoms are controlled, though she has anticholinergic SEs and takes saliva substitutes (she didn't realize that dry mouth was se of amitr.)  Appetite is poor; no taste, feels hungry but after making a meal won't want to eat it.  Craves fresh fruit  like grapes,  strawberries, cantaloupe.  "No taste for food" otherwise. Pants are getting saggy. Craves white potato.  We discussed whether her painful teeth may be interfering.  She doesn't think so.  Takes care of a 65 yo sometimes; "she keeps me going".  Patient Active Problem List   Diagnosis Date Noted   Hx of adenomatous colonic polyps 04/18/2021   Iron deficiency 04/18/2021   Patellar subluxation 03/03/2021   Numbness and tingling in both hands 04/23/2020   Prediabetes 04/23/2020   Chronic pain of right knee 01/16/2020   Dry mouth 01/16/2020   Obstructive sleep apnea hypopnea, moderate 06/20/2019   Chronic cough 12/24/2018   Obesity 10/13/2017   DOE (dyspnea on exertion)    CAD S/P percutaneous coronary angioplasty 10/2017 10/12/2017   Muscle spasm 07/20/2017   Iron deficiency anemia 03/27/2017   Lipoma 02/14/2017   Internal hemorrhoids 02/13/2017   Bartholin gland cyst 11/21/2016   TIA (transient ischemic attack) 08/21/2015   Aortic valve stenosis 06/11/2015   Dental caries 05/09/2013   Seasonal allergies 04/03/2012   Hypothyroidism 03/30/2012   Osteoarthritis 11/07/2011   Antiplatelet or antithrombotic long-term use 09/01/2011   Essential hypertension 07/25/2011   Depression 07/25/2011   Gastroesophageal reflux disease 07/25/2011   Tobacco abuse 07/25/2011   Hyperlipidemia 07/25/2011    Current Outpatient Medications:    atorvastatin (LIPITOR) 40 MG tablet, TAKE 1 TABLET BY MOUTH ONCE DAILY AT 6 PM, Disp: 90 tablet, Rfl: 3   buPROPion (WELLBUTRIN SR) 150 MG  12 hr tablet, TAKE 1 TABLET (150MG  TOTAL) BY MOUTH 2 TIMES A DAY, Disp: 60 tablet, Rfl: 5   calcium-vitamin D (OSCAL WITH D) 500-200 MG-UNIT per tablet, Take 1 tablet by mouth daily., Disp: 30 tablet, Rfl: 3   cetirizine (ZYRTEC) 10 MG tablet, TAKE 1 TABLET (10MG  TOTAL) BY MOUTH DAILY, Disp: 90 tablet, Rfl: 3   clopidogrel (PLAVIX) 75 MG tablet, Take 1 tablet (75 mg total) by mouth daily with breakfast., Disp: 90 tablet, Rfl:  3   ezetimibe (ZETIA) 10 MG tablet, Take 1 tablet (10 mg total) by mouth daily., Disp: 90 tablet, Rfl: 3   ferrous sulfate 325 (65 FE) MG tablet, Take 1 tablet (325 mg total) by mouth daily., Disp: 30 tablet, Rfl: 3   levothyroxine (SYNTHROID) 100 MCG tablet, TAKE 1 TABLET (100MCG TOTAL) BY MOUTH DAILY., Disp: 90 tablet, Rfl: 3   losartan-hydrochlorothiazide (HYZAAR) 100-25 MG tablet, Take 1 tablet by mouth daily., Disp: 90 tablet, Rfl: 3   Melatonin 3 MG TABS, Take 3 mg by mouth at bedtime., Disp: , Rfl:    metFORMIN (GLUCOPHAGE) 500 MG tablet, Take 1 tablet (500 mg total) by mouth daily., Disp: 90 tablet, Rfl: 3   metoprolol succinate (TOPROL-XL) 25 MG 24 hr tablet, Take 0.5 tablets (12.5 mg total) by mouth daily., Disp: 90 tablet, Rfl: 3   omeprazole (PRILOSEC) 40 MG capsule, Take 1 capsule (40 mg total) by mouth 2 (two) times daily., Disp: 180 capsule, Rfl: 2   amitriptyline (ELAVIL) 25 MG tablet, Take 2 tablets (50 mg total) by mouth at bedtime., Disp: 180 tablet, Rfl: 3   PAZEO 0.7 % SOLN, PLACE 1 DROP TO EYE DAILY., Disp: 7.5 mL, Rfl: 0  BP 127/66 (BP Location: Left Arm, Patient Position: Sitting, Cuff Size: Small)   Pulse 97   Temp 98.4 F (36.9 C) (Oral)   Ht 5\' 7"  (1.702 m)   Wt 237 lb (107.5 kg)   LMP 02/11/2011 Comment: no chance of pregnancy per pt  SpO2 99%   BMI 37.12 kg/m   Physical Exam: Obese habitus. Face and upper body were diaporetic during most of visit, though HR normal.  Skin felt normal temp.   Heart RRR, Systolic murmur at base, no lung rales. No LE edema and no JVD. R hip no tenderness over greater trochanter.  No pain with external and internal rotation.  Able to stand on each leg separately without sense of giveway.  GAit is cautious, balanced, reduced arm swing.  Assessment and plan (see also problem based documentation).  Emma Bonilla has a number of active issues; her first order of business is to proceed with dental extraction so that she can chew  comfortably and can proceed with EGD to further evaluate her iron deficiency (she continues to take oral Fe after receiving an infusion of Feraheme in 03/2021).  The R hip sensation of instability may be due to OA, though I am not reproducing much discomfort on exam today.  SHe is eager to work with PT to work on balance "I have to keep up with the little ones".   The spells of diaphoresis or as not yet understood.  They do not feel like her postmenopausal hot flashes, during which she indeed felt "hot".  Check TSH.  I'll continue to think about this.  Her meds are not likely to cause such an adverse effect, though I'll review again.  She'll return in 1-2 months to continue to work through her problem list.

## 2021-11-07 NOTE — Patient Instructions (Signed)
Emma Bonilla, I enjoyed meeting with you today!  We discussed many things, including your balance challenges and falls, and agreed to physical therapy.  We talked about your sweating when you are not feeling hot, and we will recheck your thyroid function today.  It's time to recheck your prediabetes today as well.  Your blood pressure looks perfect!  Flu shot today, thank you.  Please come back in a month or two for a recheck. TAke care and stay well!  I hope you and your family have a lovely holiday. Dr. Jimmye Norman

## 2021-11-09 LAB — CBC
Hematocrit: 43.9 % (ref 34.0–46.6)
Hemoglobin: 14 g/dL (ref 11.1–15.9)
MCH: 28.1 pg (ref 26.6–33.0)
MCHC: 31.9 g/dL (ref 31.5–35.7)
MCV: 88 fL (ref 79–97)
Platelets: 366 10*3/uL (ref 150–450)
RBC: 4.99 x10E6/uL (ref 3.77–5.28)
RDW: 14 % (ref 11.7–15.4)
WBC: 5.6 10*3/uL (ref 3.4–10.8)

## 2021-11-09 LAB — TSH: TSH: 5.09 u[IU]/mL — ABNORMAL HIGH (ref 0.450–4.500)

## 2021-11-09 LAB — IRON AND TIBC
Iron Saturation: 11 % — ABNORMAL LOW (ref 15–55)
Iron: 39 ug/dL (ref 27–139)
Total Iron Binding Capacity: 342 ug/dL (ref 250–450)
UIBC: 303 ug/dL (ref 118–369)

## 2021-11-09 LAB — HEMOGLOBIN A1C
Est. average glucose Bld gHb Est-mCnc: 134 mg/dL
Hgb A1c MFr Bld: 6.3 % — ABNORMAL HIGH (ref 4.8–5.6)

## 2021-11-09 LAB — FERRITIN: Ferritin: 22 ng/mL (ref 15–150)

## 2021-12-03 ENCOUNTER — Encounter: Payer: Self-pay | Admitting: Internal Medicine

## 2021-12-17 NOTE — Assessment & Plan Note (Signed)
Knees doing much better; minimal symptoms, no sense of instability (though her hip is now feeling somewhat unstable).

## 2021-12-17 NOTE — Assessment & Plan Note (Signed)
Check TSH today; she has been having diaphoresis episodes, with feeling hot.  NO other symptoms of hyperthyroidism, but worth evaluating.

## 2021-12-17 NOTE — Assessment & Plan Note (Signed)
Well controlled 127/66 on Toprol XL 25 mg 1/2 tab daily, and losartan 100/HCTZ 25 mg 1 tab daily.  Continue current regimen.

## 2021-12-17 NOTE — Assessment & Plan Note (Signed)
Check A1c today.  She is concerned that she will develop diabetes.  NO polydypsia or polyuria.

## 2021-12-17 NOTE — Assessment & Plan Note (Signed)
Extensive disease; has been advised by a dentist to have all teeth extracted.  SHe has not followed up as she is not looking forward to this.  Cannot undergo EGD to eval hx of FE def due to a loose tooth.

## 2021-12-17 NOTE — Assessment & Plan Note (Signed)
Remains on amitriptyline 50 mg nightly which has helped with insomnia, depression, and headache prevention.  She would like to discuss alternatives but is hesitant to change a regimen which has successfully managed her symptoms.  We'll continue to talk about this.

## 2021-12-17 NOTE — Assessment & Plan Note (Signed)
Asymptomatic.  Murmur audible.  Last echo 05/2021 compared to 2 yrs prior; no signif change.  Mild aortic valve stenosis. Aortic valve mean gradient measures 15.0 mmHg. Aortic valve peak gradient measures 29.2 mmHg. Aortic valve area, by VTI measures 2.09 cm.

## 2021-12-17 NOTE — Assessment & Plan Note (Signed)
Screening low dose CT 03/2021:  Isolated posterior right upper lobe pulmonary nodule of volume derived equivalent diameter 5.7 mm. Benign appearing study, repeat 12 m.

## 2021-12-17 NOTE — Assessment & Plan Note (Signed)
Asymptomatic (no angina or exertional dypsnea).  Management of risk factors for secondary prevention.  Antiplatelet is Plavix long term since PCA 2018 per cardiology.

## 2021-12-17 NOTE — Assessment & Plan Note (Signed)
Not discussed at this visit; I'll f/u next time.

## 2021-12-17 NOTE — Assessment & Plan Note (Signed)
Received outpatient iron infusion which helped her symptoms somewhat.  No recent melena; she is taking bid PPI, also on Plavix long term.  EGD pending extraction of a loose tooth.

## 2021-12-17 NOTE — Assessment & Plan Note (Signed)
Asymptomatic on PPI, cannot undergo desired EGD due to a loose tooth.  Fe deficiency was treated with infusion a few months ago.  Continue omeprazole 40 mg bid per GI.

## 2021-12-31 ENCOUNTER — Telehealth: Payer: Self-pay | Admitting: Internal Medicine

## 2021-12-31 NOTE — Telephone Encounter (Signed)
°*  STAT* If patient is at the pharmacy, call can be transferred to refill team.   1. Which medications need to be refilled? (please list name of each medication and dose if known) atorvastatin (LIPITOR) 40 MG tablet  2. Which pharmacy/location (including street and city if local pharmacy) is medication to be sent to?  [facility], [place]: [phone] F: [phone]  3. Do they need a 30 day or 90 day supply? 90 ds

## 2022-01-01 ENCOUNTER — Other Ambulatory Visit: Payer: Self-pay

## 2022-01-01 ENCOUNTER — Ambulatory Visit: Payer: Medicare Other | Attending: Internal Medicine

## 2022-01-01 DIAGNOSIS — R2681 Unsteadiness on feet: Secondary | ICD-10-CM | POA: Diagnosis not present

## 2022-01-01 DIAGNOSIS — R2689 Other abnormalities of gait and mobility: Secondary | ICD-10-CM | POA: Diagnosis present

## 2022-01-01 DIAGNOSIS — M6281 Muscle weakness (generalized): Secondary | ICD-10-CM | POA: Diagnosis present

## 2022-01-01 NOTE — Therapy (Signed)
OUTPATIENT PHYSICAL THERAPY THORACOLUMBAR EVALUATION   Patient Name: Emma Bonilla MRN: [id number] DOB:26-Jun-1956, 66 y.o., female Today's Date: 01/01/2022   PT End of Session - 01/01/22 1255     Visit Number 1    Number of Visits 17    Date for PT Re-Evaluation 02/26/22    Authorization Type [employer] Medicare    PT Start Time 0623   arrived late   PT Stop Time 1305    PT Time Calculation (min) 35 min    Activity Tolerance Patient tolerated treatment well    Behavior During Therapy WFL for tasks assessed/performed             Past Medical History:  Diagnosis Date   Anemia    Arthritis    Depression    Diastolic dysfunction without heart failure    GERD (gastroesophageal reflux disease)    Hepatic steatosis    a. seen on prior US.   Hyperlipidemia    Hypertension    Mild aortic regurgitation    Mild aortic stenosis    Premature atrial contractions    PVC's (premature ventricular contractions)    Thyroid disease    TIA (transient ischemic attack)    2010   Tobacco abuse    Past Surgical History:  Procedure Laterality Date   CORONARY STENT INTERVENTION N/A 10/13/2017   Procedure: CORONARY STENT INTERVENTION;  Surgeon: Leonie Man, MD;  Location: [facility];  Service: Cardiovascular;  Laterality: N/A;   DILATION AND CURETTAGE OF UTERUS     30 years ago   FOOT SURGERY     LEFT HEART CATH AND CORONARY ANGIOGRAPHY N/A 10/13/2017   Procedure: LEFT HEART CATH AND CORONARY ANGIOGRAPHY;  Surgeon: Leonie Man, MD;  Location: [facility];  Service: Cardiovascular;  Laterality: N/A;   Patient Active Problem List   Diagnosis Date Noted   Hepatic steatosis 04/30/2021   Hx of adenomatous colonic polyps 04/18/2021   Patellar subluxation 03/03/2021   Numbness and tingling in both hands 04/23/2020   Prediabetes 04/23/2020   Chronic pain of right knee 01/16/2020   Mouth dryness due to TCA 01/16/2020   Obstructive sleep apnea hypopnea, moderate 06/20/2019    Chronic cough 12/24/2018   Obesity 10/13/2017   CAD S/P percutaneous coronary angioplasty 10/2017 10/12/2017   Iron deficiency anemia 03/27/2017   Lipoma 02/14/2017   Internal hemorrhoids 02/13/2017   Bartholin gland cyst 11/21/2016   TIA (transient ischemic attack) 08/21/2015   Aortic valve stenosis 06/11/2015   Dental caries 05/09/2013   Seasonal allergies 04/03/2012   Hypothyroidism 03/30/2012   Osteoarthritis 11/07/2011   Antiplatelet or antithrombotic long-term use 09/01/2011   Essential hypertension 07/25/2011   Depression 07/25/2011   Gastroesophageal reflux disease 07/25/2011   Tobacco abuse 07/25/2011   Hyperlipidemia 07/25/2011    PCP: Angelica Pou, MD  REFERRING PROVIDER: Angelica Pou, MD  REFERRING DIAG:  R26.89 (ICD-10-CM) - Balance problem  THERAPY DIAG:  Unsteadiness on feet  Muscle weakness (generalized)  Other abnormalities of gait and mobility  ONSET DATE: Chronic  SUBJECTIVE:  SUBJECTIVE STATEMENT: Pt presents to PT with reports of imbalance and chronic R hip pain. She has PMH of CVA with R sided weakness, believes this is the reason for her hip pain. Has had a few falls in the last few months, with increasing unsteadiness and R LE buckling. She denies N/T down R LE, but does have some referred pain down her anterior/posterior R LE.   PERTINENT HISTORY:  Chronic lower back and R hip pain with increasing imbalance and unsteadiness following CVA in 2012  PAIN:  Are you having pain? No NPRS scale: 0/10 (8/10 at worst) Pain location: R hip PAIN TYPE: sharp Pain description: intermittent  Aggravating factors: prolonged walking/standing, stairs Relieving factors: rest, heat  PRECAUTIONS: Fall  WEIGHT BEARING RESTRICTIONS No  FALLS:  Has patient  fallen in last 6 months? Yes, Number of falls: 3 falls; most recent in January 2023; R LE "gave out" per pt  LIVING ENVIRONMENT: Lives with: lives with their family Lives in: House/apartment Stairs: Yes; External: 3 steps; can reach both Has following equipment at home: Single point cane  OCCUPATION: Not working  PLOF: Independent and Independent with basic ADLs  PATIENT GOALS: decrease R hip pain and improve    OBJECTIVE:   DIAGNOSTIC FINDINGS:  N/A  PATIENT SURVEYS:  FOTO 41% function; 51% predicted  COGNITION:  Overall cognitive status: Within functional limits for tasks assessed     SENSATION:  Light touch: Appears intact  POSTURE:  Medium body habitus, rounded shoulders, fwd head  PALPATION: TTP to R posterior gluteal musculature, R greater trochanteric musculature  LE MMT:  MMT Right 01/01/2022 Left 01/01/2022  Hip flexion (L2, L3) 3/5 4/5  Knee extension (L3) 5/5 5/5  Knee flexion 4/5 4/5  Hip abduction 3/5 3+/5  Hip adduction 3/5 3/5   (Blank rows = not tested, score listed is out of 5 possible points.  N = WNL, D = diminished, C = clear for gross weakness with myotome testing, * = concordant pain with testing)    FUNCTIONAL TESTS:  30 Second Sit to Stand: 12 reps SLS: 3 seconds Rt  GAIT: Distance walked: 32ft Assistive device utilized: None Level of assistance: Complete Independence Comments: antalgic gait R LE  TODAY'S TREATMENT  [facility] Adult PT Treatment:                                                DATE: 01/01/2022 Therapeutic Exercise: Supine clamshell x 15 GTB Supine SLR  x 5 ea Tandem stance Rt back x 30"  PATIENT EDUCATION:  Education details: eval findings, FOTO, HEP, POC Person educated: Patient Education method: Explanation, Demonstration, and Handouts Education comprehension: verbalized understanding and returned demonstration   HOME EXERCISE PROGRAM: Access Code: [email or web address] URL: [email or web address] Date:  01/01/2022 Prepared by: Octavio Manns  Exercises Hooklying Clamshell with Resistance - 1 x daily - 7 x weekly - 3 sets - 15 reps Small Range Straight Leg Raise - 1 x daily - 7 x weekly - 2 sets - 10 reps Standing Tandem Balance with Counter Support - 1 x daily - 7 x weekly - 2 reps - 30 sec hold   ASSESSMENT:  CLINICAL IMPRESSION: Patient is a 66 y.o. F who was seen today for physical therapy evaluation and treatment for chronic R hip pain and imbalance. Objective impairments include decreased activity tolerance, decreased balance, decreased mobility, difficulty  walking, decreased strength, and pain. These impairments are limiting patient from community activity and yard work. Personal factors including Fitness and 3+ comorbidities: depression, CVA, and HTN  are also affecting patient's functional outcome. She demonstrates significant proximal hip weakness, R>L, that is impacting her balance and safe mobility. Patient will benefit from skilled PT to address above impairments and improve overall function.  REHAB POTENTIAL: Good  CLINICAL DECISION MAKING: Evolving/moderate complexity  EVALUATION COMPLEXITY: Moderate   GOALS: Goals reviewed with patient? No  SHORT TERM GOALS:  STG Name Target Date Goal status  1 Pt will be compliant and knowledgeable with initial HEP for improved comfort and carryover Baseline: initial HEP given 01/22/2022 INITIAL  2 Pt will self report R hip pain no greater than 5/10 for improved comfort and functional ability Baseline: 8/10 at worst 01/22/2022 INITIAL   LONG TERM GOALS:   LTG Name Target Date Goal status  1 Pt will improve FOTO function score to no less than 51% as proxy for functional improvement Baseline: 41% 02/26/2022 INITIAL  2 Pt will self report R hip pain no greater than 3/10 for improved comfort and functional ability Baseline: 8/10 at worst 02/26/2022 INITIAL  3 Pt will be able to hold R SLS for at least 15 seconds for improving balance and  safety Baseline: 3 seconds 02/26/2022 INITIAL  4 Pt will improve LE MMT to no less than 4/5 for all tested motions for improved strength and mobility Baseline: see chart 02/26/2022 INITIAL   PLAN: PT FREQUENCY: 1-2x/week  PT DURATION: 8 weeks  PLANNED INTERVENTIONS: Therapeutic exercises, Therapeutic activity, Neuro Muscular re-education, Balance training, Gait training, Patient/Family education, Joint mobilization, Aquatic Therapy, Dry Needling, Cryotherapy, Moist heat, Vasopneumatic device, and Manual therapy  PLAN FOR NEXT SESSION: assess HEP response, progress LE strength and balance as able   Ward Chatters, PT 01/01/2022, 1:09 PM

## 2022-01-07 ENCOUNTER — Encounter: Payer: Self-pay | Admitting: Internal Medicine

## 2022-01-07 MED ORDER — ATORVASTATIN CALCIUM 40 MG PO TABS: ORAL_TABLET | ORAL | 1 refills | Status: DC

## 2022-01-14 ENCOUNTER — Other Ambulatory Visit: Payer: Self-pay

## 2022-01-14 ENCOUNTER — Ambulatory Visit: Payer: Medicare Other

## 2022-01-14 DIAGNOSIS — R2681 Unsteadiness on feet: Secondary | ICD-10-CM

## 2022-01-14 DIAGNOSIS — M6281 Muscle weakness (generalized): Secondary | ICD-10-CM

## 2022-01-14 DIAGNOSIS — R2689 Other abnormalities of gait and mobility: Secondary | ICD-10-CM

## 2022-01-14 NOTE — Therapy (Signed)
OUTPATIENT PHYSICAL THERAPY TREATMENT NOTE   Patient Name: Emma Bonilla MRN: [id number] DOB:June 04, 1956, 66 y.o., female Today's Date: 01/14/2022  PCP: Angelica Pou, MD REFERRING PROVIDER: Angelica Pou, MD   PT End of Session - 01/14/22 1733     Visit Number 2    Number of Visits 17    Date for PT Re-Evaluation 02/26/22    Authorization Type U[employer] Medicare    PT Start Time 1735    PT Stop Time 1815    PT Time Calculation (min) 40 min    Activity Tolerance Patient tolerated treatment well    Behavior During Therapy [facility] for tasks assessed/performed             Past Medical History:  Diagnosis Date   Anemia    Arthritis    Depression    Diastolic dysfunction without heart failure    GERD (gastroesophageal reflux disease)    Hepatic steatosis    a. seen on prior US.   Hyperlipidemia    Hypertension    Mild aortic regurgitation    Mild aortic stenosis    Premature atrial contractions    PVC's (premature ventricular contractions)    Thyroid disease    TIA (transient ischemic attack)    2010   Tobacco abuse    Past Surgical History:  Procedure Laterality Date   CORONARY STENT INTERVENTION N/A 10/13/2017   Procedure: CORONARY STENT INTERVENTION;  Surgeon: Leonie Man, MD;  Location: [place] [facility];  Service: Cardiovascular;  Laterality: N/A;   DILATION AND CURETTAGE OF UTERUS     30 years ago   FOOT SURGERY     LEFT HEART CATH AND CORONARY ANGIOGRAPHY N/A 10/13/2017   Procedure: LEFT HEART CATH AND CORONARY ANGIOGRAPHY;  Surgeon: Leonie Man, MD;  Location: [place] [facility];  Service: Cardiovascular;  Laterality: N/A;   Patient Active Problem List   Diagnosis Date Noted   Hepatic steatosis 04/30/2021   Hx of adenomatous colonic polyps 04/18/2021   Patellar subluxation 03/03/2021   Numbness and tingling in both hands 04/23/2020   Prediabetes 04/23/2020   Chronic pain of right knee 01/16/2020   Mouth dryness due to TCA 01/16/2020    Obstructive sleep apnea hypopnea, moderate 06/20/2019   Chronic cough 12/24/2018   Obesity 10/13/2017   CAD S/P percutaneous coronary angioplasty 10/2017 10/12/2017   Iron deficiency anemia 03/27/2017   Lipoma 02/14/2017   Internal hemorrhoids 02/13/2017   Bartholin gland cyst 11/21/2016   TIA (transient ischemic attack) 08/21/2015   Aortic valve stenosis 06/11/2015   Dental caries 05/09/2013   Seasonal allergies 04/03/2012   Hypothyroidism 03/30/2012   Osteoarthritis 11/07/2011   Antiplatelet or antithrombotic long-term use 09/01/2011   Essential hypertension 07/25/2011   Depression 07/25/2011   Gastroesophageal reflux disease 07/25/2011   Tobacco abuse 07/25/2011   Hyperlipidemia 07/25/2011    REFERRING DIAG: R26.89 (ICD-10-CM) - Balance problem  THERAPY DIAG:  Unsteadiness on feet  Muscle weakness (generalized)  Other abnormalities of gait and mobility  PERTINENT HISTORY: Chronic lower back and R hip pain with increasing imbalance and unsteadiness following CVA in 2012  PRECAUTIONS: Fall  SUBJECTIVE: I've had a good day so far. My pain has been lower the past couple days.   PAIN:  Are you having pain? Yes NPRS scale: 2/10  Pain location: R hip PAIN TYPE: sharp Pain description: intermittent  Aggravating factors: prolonged walking/standing, stairs Relieving factors: rest, heat   OBJECTIVE:    DIAGNOSTIC FINDINGS:  N/A  PATIENT SURVEYS:  FOTO 41% function; 51% predicted   COGNITION:          Overall cognitive status: Within functional limits for tasks assessed                        SENSATION:          Light touch: Appears intact   POSTURE:  Medium body habitus, rounded shoulders, fwd head   PALPATION: TTP to R posterior gluteal musculature, R greater trochanteric musculature   LE MMT:   MMT Right 01/01/2022 Left 01/01/2022  Hip flexion (L2, L3) 3/5 4/5  Knee extension (L3) 5/5 5/5  Knee flexion 4/5 4/5  Hip abduction 3/5 3+/5  Hip  adduction 3/5 3/5    (Blank rows = not tested, score listed is out of 5 possible points.  N = WNL, D = diminished, C = clear for gross weakness with myotome testing, * = concordant pain with testing)      FUNCTIONAL TESTS:  30 Second Sit to Stand: 12 reps SLS: 3 seconds Rt 01/14/22 30 sec STS: 12 reps SLS: 10 sec Rt (hands hovering over FM bar)   GAIT: Distance walked: 75ft Assistive device utilized: None Level of assistance: Complete Independence Comments: antalgic gait R LE   TODAY'S TREATMENT  [facility] Adult PT Treatment:                                                DATE: 01/14/2022 Therapeutic Exercise: Nustep level 4 x 5 mins Supine clamshell GTB 2 x 10  Supine SLR 2 x 10 BIL Supine march GTB 2 x 10 BIL Supine hip adduction ball squeeze 5 sec hold 2 x 10 Seated march x 10 BIL Seated knee flexion GTB x 10 BIL STS x 30 sec (12 reps) Standing 3 way hip at FM x 10 ea BIL Standing hamstring curl at FM x 10 BIL Mini squats at FM x 10 Neuromuscular re-ed: Tandem stance BIL x 30 sec ea Romberg stance head turns 2 x 30 sec Romberg stance eyes closed 2 x 30 sec Romberg stance with UE reaching 2 x 30 sec SLS 2 x 30 sec   [facility] Adult PT Treatment:                                                DATE: 01/01/2022 Therapeutic Exercise: Supine clamshell x 15 GTB Supine SLR  x 5 ea Tandem stance Rt back x 30"   PATIENT EDUCATION:  Education details: eval findings, FOTO, HEP, POC Person educated: Patient Education method: Explanation, Demonstration, and Handouts Education comprehension: verbalized understanding and returned demonstration     HOME EXERCISE PROGRAM: Access Code: [email or web address] URL: [email or web address] Date: 01/01/2022 Prepared by: Octavio Manns   Exercises Hooklying Clamshell with Resistance - 1 x daily - 7 x weekly - 3 sets - 15 reps Small Range Straight Leg Raise - 1 x daily - 7 x weekly - 2 sets - 10 reps Standing Tandem Balance with Counter Support  - 1 x daily - 7 x weekly - 2 reps - 30 sec hold     ASSESSMENT:   CLINICAL IMPRESSION: Patient presents to PT with improved pain levels, LE  weakness, and balance deficits. Focus of session on bilateral LE strengthening and balance and patient displayed good participation throughout all exercises with no adverse effects. She has improved her SLS balance on R to 10 seconds before loss of balance. Patient continues to benefit from skilled PT services and should be progressed as able to improve functional independence.   REHAB POTENTIAL: Good   CLINICAL DECISION MAKING: Evolving/moderate complexity   EVALUATION COMPLEXITY: Moderate     GOALS: Goals reviewed with patient? No   SHORT TERM GOALS:   STG Name Target Date Goal status  1 Pt will be compliant and knowledgeable with initial HEP for improved comfort and carryover Baseline: initial HEP given 01/22/2022 INITIAL  2 Pt will self report R hip pain no greater than 5/10 for improved comfort and functional ability Baseline: 8/10 at worst 01/22/2022 INITIAL    LONG TERM GOALS:    LTG Name Target Date Goal status  1 Pt will improve FOTO function score to no less than 51% as proxy for functional improvement Baseline: 41% 02/26/2022 INITIAL  2 Pt will self report R hip pain no greater than 3/10 for improved comfort and functional ability Baseline: 8/10 at worst 02/26/2022 INITIAL  3 Pt will be able to hold R SLS for at least 15 seconds for improving balance and safety Baseline: 3 seconds 02/26/2022 INITIAL  4 Pt will improve LE MMT to no less than 4/5 for all tested motions for improved strength and mobility Baseline: see chart 02/26/2022 INITIAL    PLAN: PT FREQUENCY: 1-2x/week   PT DURATION: 8 weeks   PLANNED INTERVENTIONS: Therapeutic exercises, Therapeutic activity, Neuro Muscular re-education, Balance training, Gait training, Patient/Family education, Joint mobilization, Aquatic Therapy, Dry Needling, Cryotherapy, Moist heat,  Vasopneumatic device, and Manual therapy   PLAN FOR NEXT SESSION: assess HEP response, progress LE strength and balance as able    Evelene Croon, PTA 01/14/2022, 6:21 PM

## 2022-01-16 ENCOUNTER — Ambulatory Visit: Payer: Medicare Other

## 2022-01-16 ENCOUNTER — Encounter: Payer: Self-pay | Admitting: Internal Medicine

## 2022-01-16 ENCOUNTER — Ambulatory Visit (INDEPENDENT_AMBULATORY_CARE_PROVIDER_SITE_OTHER): Payer: Medicare Other | Admitting: Internal Medicine

## 2022-01-16 ENCOUNTER — Other Ambulatory Visit: Payer: Self-pay

## 2022-01-16 VITALS — BP 115/62 | HR 86 | Temp 98.3°F | Ht 67.0 in | Wt 238.0 lb

## 2022-01-16 DIAGNOSIS — G4733 Obstructive sleep apnea (adult) (pediatric): Secondary | ICD-10-CM | POA: Diagnosis not present

## 2022-01-16 DIAGNOSIS — I2510 Atherosclerotic heart disease of native coronary artery without angina pectoris: Secondary | ICD-10-CM

## 2022-01-16 DIAGNOSIS — Z9861 Coronary angioplasty status: Secondary | ICD-10-CM

## 2022-01-16 DIAGNOSIS — J3089 Other allergic rhinitis: Secondary | ICD-10-CM

## 2022-01-16 DIAGNOSIS — E6609 Other obesity due to excess calories: Secondary | ICD-10-CM

## 2022-01-16 DIAGNOSIS — Z800 Family history of malignant neoplasm of digestive organs: Secondary | ICD-10-CM

## 2022-01-16 DIAGNOSIS — R7303 Prediabetes: Secondary | ICD-10-CM

## 2022-01-16 DIAGNOSIS — M170 Bilateral primary osteoarthritis of knee: Secondary | ICD-10-CM

## 2022-01-16 DIAGNOSIS — E039 Hypothyroidism, unspecified: Secondary | ICD-10-CM

## 2022-01-16 DIAGNOSIS — R2681 Unsteadiness on feet: Secondary | ICD-10-CM | POA: Diagnosis not present

## 2022-01-16 DIAGNOSIS — F4381 Prolonged grief disorder: Secondary | ICD-10-CM

## 2022-01-16 DIAGNOSIS — Z6837 Body mass index (BMI) 37.0-37.9, adult: Secondary | ICD-10-CM

## 2022-01-16 DIAGNOSIS — D500 Iron deficiency anemia secondary to blood loss (chronic): Secondary | ICD-10-CM | POA: Diagnosis not present

## 2022-01-16 DIAGNOSIS — F4321 Adjustment disorder with depressed mood: Secondary | ICD-10-CM

## 2022-01-16 DIAGNOSIS — F5104 Psychophysiologic insomnia: Secondary | ICD-10-CM

## 2022-01-16 DIAGNOSIS — K029 Dental caries, unspecified: Secondary | ICD-10-CM

## 2022-01-16 DIAGNOSIS — K648 Other hemorrhoids: Secondary | ICD-10-CM | POA: Diagnosis not present

## 2022-01-16 DIAGNOSIS — K760 Fatty (change of) liver, not elsewhere classified: Secondary | ICD-10-CM

## 2022-01-16 DIAGNOSIS — M6281 Muscle weakness (generalized): Secondary | ICD-10-CM

## 2022-01-16 DIAGNOSIS — Z7902 Long term (current) use of antithrombotics/antiplatelets: Secondary | ICD-10-CM

## 2022-01-16 DIAGNOSIS — I10 Essential (primary) hypertension: Secondary | ICD-10-CM

## 2022-01-16 MED ORDER — AMITRIPTYLINE HCL 25 MG PO TABS: 25.0000 mg | ORAL_TABLET | Freq: Every day | ORAL | 3 refills | Status: DC

## 2022-01-16 NOTE — Assessment & Plan Note (Signed)
Discussed.  Labs today.  Discussed healthy weight loss.  She is interested, though having no luck by herself.  Referral to RD and to providers weight loss/exercise program.

## 2022-01-16 NOTE — Assessment & Plan Note (Signed)
Discussed at length.  She desires assistance with weight loss.  Referral to be placed with dietician.  [facility] will [facility] more about it and determine if it will be doable  W/ regard to transportation.

## 2022-01-16 NOTE — Assessment & Plan Note (Signed)
Due for iron studies to determine efficacy of recent IV followed by oral supplementation.  Not experiencing symptoms of anemia.

## 2022-01-16 NOTE — Assessment & Plan Note (Signed)
Sees DR. Hilty annually, next 05/2022. No symptoms.

## 2022-01-16 NOTE — Therapy (Signed)
OUTPATIENT PHYSICAL THERAPY TREATMENT NOTE   Patient Name: Emma Bonilla MRN: [id number] DOB:11-Oct-1956, 66 y.o., female Today's Date: 01/16/2022  PCP: Angelica Pou, MD REFERRING PROVIDER: Angelica Pou, MD   PT End of Session - 01/16/22 1637     Visit Number 3    Number of Visits 17    Date for PT Re-Evaluation 02/26/22    Authorization Type UHC Medicare    PT Start Time 1640    PT Stop Time 1720    PT Time Calculation (min) 40 min    Activity Tolerance Patient tolerated treatment well    Behavior During Therapy [facility] for tasks assessed/performed              Past Medical History:  Diagnosis Date   Anemia    Arthritis    Depression    Diastolic dysfunction without heart failure    Family history of rectal cancer    GERD (gastroesophageal reflux disease)    Hepatic steatosis    a. seen on prior US.   Hyperlipidemia    Hypertension    Mild aortic regurgitation    Mild aortic stenosis    Premature atrial contractions    PVC's (premature ventricular contractions)    Thyroid disease    TIA (transient ischemic attack)    2010   Tobacco abuse    Past Surgical History:  Procedure Laterality Date   CORONARY STENT INTERVENTION N/A 10/13/2017   Procedure: CORONARY STENT INTERVENTION;  Surgeon: Leonie Man, MD;  Location: [place] [facility];  Service: Cardiovascular;  Laterality: N/A;   DILATION AND CURETTAGE OF UTERUS     30 years ago   FOOT SURGERY     LEFT HEART CATH AND CORONARY ANGIOGRAPHY N/A 10/13/2017   Procedure: LEFT HEART CATH AND CORONARY ANGIOGRAPHY;  Surgeon: Leonie Man, MD;  Location: [place] [facility];  Service: Cardiovascular;  Laterality: N/A;   Patient Active Problem List   Diagnosis Date Noted   Family history of rectal cancer 01/16/2022   Chronic insomnia 01/16/2022   Hepatic steatosis 04/30/2021   Hx of adenomatous colonic polyps 04/18/2021   Patellar subluxation 03/03/2021   Numbness and tingling in both hands  04/23/2020   Prediabetes 04/23/2020   Mouth dryness due to TCA 01/16/2020   Obstructive sleep apnea hypopnea, moderate 06/20/2019   Obesity 10/13/2017   CAD S/P percutaneous coronary angioplasty 10/2017 10/12/2017   Iron deficiency anemia 03/27/2017   Lipoma 02/14/2017   Internal hemorrhoids 02/13/2017   TIA (transient ischemic attack) 08/21/2015   Aortic valve stenosis 06/11/2015   Dental caries 05/09/2013   Perennial allergic rhinitis 04/03/2012   Hypothyroidism 03/30/2012   Osteoarthritis 11/07/2011   Antiplatelet or antithrombotic long-term use 09/01/2011   Essential hypertension 07/25/2011   Reaction, adjustment, with depressed mood, prolonged 07/25/2011   Gastroesophageal reflux disease 07/25/2011   Tobacco abuse 07/25/2011   Hyperlipidemia 07/25/2011    REFERRING DIAG: R26.89 (ICD-10-CM) - Balance problem  THERAPY DIAG:  Unsteadiness on feet  Muscle weakness (generalized)  PERTINENT HISTORY: Chronic lower back and R hip pain with increasing imbalance and unsteadiness following CVA in 2012  PRECAUTIONS: Fall  SUBJECTIVE:  Pt presents to PT with reports of R knee pain, but does report decreased lower back and R hip pain. Has been compliant with her HEP with no adverse effect. Pt is ready to begin PT at this time.   PAIN:  Are you having pain? Yes NPRS scale: 3/10  Pain location: R knee  PAIN TYPE: sharp Pain description: intermittent  Aggravating factors: prolonged walking/standing, stairs Relieving factors: rest, heat   OBJECTIVE:    DIAGNOSTIC FINDINGS:  N/A   PATIENT SURVEYS:  FOTO 41% function; 51% predicted   COGNITION:          Overall cognitive status: Within functional limits for tasks assessed                        SENSATION:          Light touch: Appears intact   POSTURE:  Medium body habitus, rounded shoulders, fwd head   PALPATION: TTP to R posterior gluteal musculature, R greater trochanteric musculature   LE MMT:   MMT  Right 01/01/2022 Left 01/01/2022  Hip flexion (L2, L3) 3/5 4/5  Knee extension (L3) 5/5 5/5  Knee flexion 4/5 4/5  Hip abduction 3/5 3+/5  Hip adduction 3/5 3/5    (Blank rows = not tested, score listed is out of 5 possible points.  N = WNL, D = diminished, C = clear for gross weakness with myotome testing, * = concordant pain with testing)      FUNCTIONAL TESTS:  30 Second Sit to Stand: 12 reps SLS: 3 seconds Rt 01/14/22 30 sec STS: 12 reps SLS: 10 sec Rt (hands hovering over FM bar)   GAIT: Distance walked: 75ft Assistive device utilized: None Level of assistance: Complete Independence Comments: antalgic gait R LE   TODAY'S TREATMENT  [facility] Adult PT Treatment:                                                DATE: 01/16/2022 Therapeutic Exercise: Nustep level 6 x 5 min while taking subjective Supine clamshell  BTB 2x15 Supine SLR 2 x 10 BIL 2# Seated march x 10 BIL Seated knee flexion GTB x 10 BIL STS 2x10 - no UE supprot Standing hip abd/ext 2x10 2# Standing hamstring curl at FM x 10 BIL Neuromuscular re-ed: Tandem stance BIL x 30 sec ea Tandem walk in // x 2 laps SLS x 30 sec each  [facility] Adult PT Treatment:                                                DATE: 01/14/2022 Therapeutic Exercise: Nustep level 4 x 5 mins Supine clamshell GTB 2 x 10  Supine SLR 2 x 10 BIL Supine march GTB 2 x 10 BIL Supine hip adduction ball squeeze 5 sec hold 2 x 10 Seated march x 10 BIL Seated knee flexion GTB x 10 BIL STS x 30 sec (12 reps) Standing 3 way hip at FM x 10 ea BIL Standing hamstring curl at FM x 10 BIL Mini squats at FM x 10 Neuromuscular re-ed: Tandem stance BIL x 30 sec ea Romberg stance head turns 2 x 30 sec Romberg stance eyes closed 2 x 30 sec Romberg stance with UE reaching 2 x 30 sec SLS 2 x 30 sec  PATIENT EDUCATION:  Education details: eval findings, FOTO, HEP, POC Person educated: Patient Education method: Explanation, Demonstration, and Handouts Education  comprehension: verbalized understanding and returned demonstration     HOME EXERCISE PROGRAM: Access Code: [email or web address] URL: [email or web address] Date: 01/01/2022 Prepared  by: Octavio Manns   Exercises Hooklying Clamshell with Resistance - 1 x daily - 7 x weekly - 3 sets - 15 reps Small Range Straight Leg Raise - 1 x daily - 7 x weekly - 2 sets - 10 reps Standing Tandem Balance with Counter Support - 1 x daily - 7 x weekly - 2 reps - 30 sec hold     ASSESSMENT: Pt was able to complete prescribed exercises with no adverse effect or increase in pain. Therapy today focused on improving strength and balance, with particular emphasis on proximal hip. She continues to benefit from skilled PT services and will continue to be seen and progressed as tolerated.    GOALS: Goals reviewed with patient? No   SHORT TERM GOALS:   STG Name Target Date Goal status  1 Pt will be compliant and knowledgeable with initial HEP for improved comfort and carryover Baseline: initial HEP given 01/22/2022 INITIAL  2 Pt will self report R hip pain no greater than 5/10 for improved comfort and functional ability Baseline: 8/10 at worst 01/22/2022 INITIAL    LONG TERM GOALS:    LTG Name Target Date Goal status  1 Pt will improve FOTO function score to no less than 51% as proxy for functional improvement Baseline: 41% 02/26/2022 INITIAL  2 Pt will self report R hip pain no greater than 3/10 for improved comfort and functional ability Baseline: 8/10 at worst 02/26/2022 INITIAL  3 Pt will be able to hold R SLS for at least 15 seconds for improving balance and safety Baseline: 3 seconds 02/26/2022 INITIAL  4 Pt will improve LE MMT to no less than 4/5 for all tested motions for improved strength and mobility Baseline: see chart 02/26/2022 INITIAL    PLAN: PT FREQUENCY: 1-2x/week   PT DURATION: 8 weeks   PLANNED INTERVENTIONS: Therapeutic exercises, Therapeutic activity, Neuro Muscular re-education,  Balance training, Gait training, Patient/Family education, Joint mobilization, Aquatic Therapy, Dry Needling, Cryotherapy, Moist heat, Vasopneumatic device, and Manual therapy   PLAN FOR NEXT SESSION: assess HEP response, progress LE strength and balance as able    Ward Chatters, PT 01/16/2022, 5:26 PM

## 2022-01-16 NOTE — Assessment & Plan Note (Signed)
A1c 6.3 at last visit.  Fruit comprises large portion of her diet.  Referral placed to dietician for this and for weight management.

## 2022-01-16 NOTE — Patient Instructions (Signed)
Ms. Dustin, Burrill to catch up with you today!  We covered a lot of info.  Reduce your amitriptyline to once a day then stop when you're ready.  We are checking liver, thyroid, and iron tests today.  I"ll call you with the results.  I am referring you to: 1)  dietician to help support you in  healthy weight loss 2)  exercise program - see what you think! 3)  therapist to work with you on grief  Gwenlyn Saran doing great and I want to you keep going in this direction!  Let's get together in 3 months.  Take care and stay well,  Dr. Jimmye Norman

## 2022-01-16 NOTE — Progress Notes (Signed)
Emma Bonilla is here for scheduled f/u after get-acquanted visit 2 months ago, to continue to review her medical problems and symptoms reported at that visit.    She is pleased to report that the bothersome diaphoresis episodes we discussed last visit have resolved w/o intervention.  No cause was found.  She has been in process of having teeth extracted (poor dentition, bone resorption) which was also necessary to proceed with EGD to evaluate iron deficiency anemia (repleted iv followed by ongoing oral supp bid, no constipation).  Yesterday all teeth on R side extracted, left side scheduled for 02/05/22.  Plans for dentures if enough bone ridge.  Will be making appt with GI for EGD after this. Energy is ok.  She continues to have intermittent BRBPR which has been attributed to hemorrhoids - her doctors have been aware.  Last colonoscopy about a year ago - she is concerned bc her sister died 2/2 rectal cancer.  Appetite and food preferences are unchanged.  Continues to crave fruit.  Not eating much protein. Desires weight loss but needs some guidance.  Getting PT for knee and hip pain and for balance problems discussed at last visit and is getting better.    LLQ/pelvic pain intermittent, hx of remote cyst in fallopian tube.  Questions whether it should be reexamined.  Had allergic rxn to NSAID for treatment at that time, rash.  Ibuprofen used since then w/o a problem.  Sister died of rectal cancer.  Another sister and her dad had Crohn's disease.  No breast cancer.    [place] - change all meds by weekend. Doesn't need acid med though  Patient Active Problem List   Diagnosis Date Noted   Family history of rectal cancer 01/16/2022   Hepatic steatosis 04/30/2021   Hx of adenomatous colonic polyps 04/18/2021   Patellar subluxation 03/03/2021   Numbness and tingling in both hands 04/23/2020   Prediabetes 04/23/2020   Chronic pain of right knee 01/16/2020   Mouth dryness due to TCA 01/16/2020    Obstructive sleep apnea hypopnea, moderate 06/20/2019   Chronic cough 12/24/2018   Obesity 10/13/2017   CAD S/P percutaneous coronary angioplasty 10/2017 10/12/2017   Iron deficiency anemia 03/27/2017   Lipoma 02/14/2017   Internal hemorrhoids 02/13/2017   TIA (transient ischemic attack) 08/21/2015   Aortic valve stenosis 06/11/2015   Dental caries 05/09/2013   Seasonal allergies 04/03/2012   Hypothyroidism 03/30/2012   Osteoarthritis 11/07/2011   Antiplatelet or antithrombotic long-term use 09/01/2011   Essential hypertension 07/25/2011   Depression 07/25/2011   Gastroesophageal reflux disease 07/25/2011   Tobacco abuse 07/25/2011   Hyperlipidemia 07/25/2011    Current Outpatient Medications:    atorvastatin (LIPITOR) 40 MG tablet, TAKE 1 TABLET BY MOUTH ONCE DAILY AT 6 PM, Disp: 90 tablet, Rfl: 1   buPROPion (WELLBUTRIN SR) 150 MG 12 hr tablet, TAKE 1 TABLET (150MG  TOTAL) BY MOUTH 2 TIMES A DAY, Disp: 60 tablet, Rfl: 5   calcium-vitamin D (OSCAL WITH D) 500-200 MG-UNIT per tablet, Take 1 tablet by mouth daily., Disp: 30 tablet, Rfl: 3   cetirizine (ZYRTEC) 10 MG tablet, TAKE 1 TABLET (10MG  TOTAL) BY MOUTH DAILY, Disp: 90 tablet, Rfl: 3   clopidogrel (PLAVIX) 75 MG tablet, Take 1 tablet (75 mg total) by mouth daily with breakfast., Disp: 90 tablet, Rfl: 3   ezetimibe (ZETIA) 10 MG tablet, Take 1 tablet (10 mg total) by mouth daily., Disp: 90 tablet, Rfl: 3   ferrous sulfate 325 (65 FE) MG  tablet, Take 1 tablet (325 mg total) by mouth daily., Disp: 30 tablet, Rfl: 3   levothyroxine (SYNTHROID) 100 MCG tablet, TAKE 1 TABLET (100MCG TOTAL) BY MOUTH DAILY., Disp: 90 tablet, Rfl: 3   losartan-hydrochlorothiazide (HYZAAR) 100-25 MG tablet, Take 1 tablet by mouth daily., Disp: 90 tablet, Rfl: 3   Melatonin 3 MG TABS, Take 3 mg by mouth at bedtime., Disp: , Rfl:    metFORMIN (GLUCOPHAGE) 500 MG tablet, Take 1 tablet (500 mg total) by mouth daily., Disp: 90 tablet, Rfl: 3   metoprolol  succinate (TOPROL-XL) 25 MG 24 hr tablet, Take 0.5 tablets (12.5 mg total) by mouth daily., Disp: 90 tablet, Rfl: 3   omeprazole (PRILOSEC) 40 MG capsule, Take 1 capsule (40 mg total) by mouth 2 (two) times daily., Disp: 180 capsule, Rfl: 2   amitriptyline (ELAVIL) 25 MG tablet, Take 1 tablet (25 mg total) by mouth at bedtime. After a couple of weeks, stop the amitriptyline altogether., Disp: 180 tablet, Rfl: 3   PAZEO 0.7 % SOLN, PLACE 1 DROP TO EYE DAILY., Disp: 7.5 mL, Rfl: 0   BP 115/62 (BP Location: Right Arm, Patient Position: Sitting, Cuff Size: Large)    Pulse 86    Temp 98.3 F (36.8 C) (Oral)    Ht 5\' 7"  (1.702 m)    Wt 238 lb (108 kg)    LMP 02/11/2011 Comment: no chance of pregnancy per pt   SpO2 94%    BMI 37.28 kg/m   Exam:  Knees w/o edema, warmth, joint line tenderness.  Crepitus bilateral.  Skin warm and dry today. Gait more confident today, more upright posture.    Assessment and plan:  Emma Bonilla is doing well.  Problem list reviewed and updated today.  She is proceeding with full dental extractions after which she'll undergo diagnostic EGD for ongoing w/u of FE deficiency anemia.  SHe is on bid PPI and has no GERD or epigastric pain sxs.  Iron studies today.  Discussed her mood, as she has been prescribed amitriptyline and bupropion.  The amitriptyline has caused dry mouth (which has probably contributed to her dental caries) and we discussed recommendation to stop, and if needed to replace with a safer alternative.  She is eager to do so.  Her sadness is associated with grieving loss of many friends and family members. She has never processed her grief and looks forward to a referral to [facility] therapist.  Referral placed.  Discussed radiographic finding of hepatic steatosis on lung CT.  Weight loss is first intervention.  Check liver tests today.  She desires referral to dietician for healthy weight loss and is interested in hearing more about the Provider's Exercise Program at  the [facility].  Referrals placed.    TSH minimally elevated at last visit - recheck to determine if dose change needed.    Carries dx of OSA but reports that she doesn't have a CPAP machine.  She would like to pursue this.  Ambulatory sleep study should suffice.  Will order.

## 2022-01-16 NOTE — Assessment & Plan Note (Signed)
Excellent control.  No change in regimen.

## 2022-01-16 NOTE — Assessment & Plan Note (Signed)
Improvement in knee pain and mobility upon initiation of PT which she has been enjoying.  No acute findings on knee exam at this time.

## 2022-01-16 NOTE — Assessment & Plan Note (Addendum)
Nose congestion, lots of drainage.  Itchy watery eyes, perennial.  Query environmental allergen in the home.  She continues antihistamine with acceptable results.  Monitor.

## 2022-01-16 NOTE — Assessment & Plan Note (Signed)
Ms. Gelardi does have chronic fe-deficiency anemia, chronic blood loss (perhaps only from hemorrhoids? EGD not yet completed) suspected.  At this time, benefits of ongoing antiplatelet therapy outweigh risks. She is taking PPI.

## 2022-01-16 NOTE — Assessment & Plan Note (Addendum)
Carrying grief of loss of her parents, guilt, and more recnt losses.  "This death is getting me down, I'm not dealing with it." Was present when her mother died.  Not present with other relatives deaths.  Agrees to therapy.  WIll place order.  Risk of TCA's discussed - she is having xerostomia and would like to stop.  She will first decrease to 1 nightly then stop when she feels ready.  She will continue the bupropion.

## 2022-01-16 NOTE — Assessment & Plan Note (Signed)
Labs last visit reviewed, mild elevation of TSH.  Recheck today to determine if dose adjustment needed.

## 2022-01-16 NOTE — Assessment & Plan Note (Signed)
Takes melatonin nearly nightly
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
## 2022-01-16 NOTE — Assessment & Plan Note (Signed)
All teeth of R mouth extracted this week; those of L mouth will be removed in March 2023.

## 2022-01-16 NOTE — Assessment & Plan Note (Signed)
Interested in CPAP, has not been fitted.  Will need sleep study, should be able to do at home.

## 2022-01-17 LAB — IRON AND TIBC
Iron Saturation: 18 % (ref 15–55)
Iron: 59 ug/dL (ref 27–139)
Total Iron Binding Capacity: 322 ug/dL (ref 250–450)
UIBC: 263 ug/dL (ref 118–369)

## 2022-01-17 LAB — TSH: TSH: 1.74 u[IU]/mL (ref 0.450–4.500)

## 2022-01-17 LAB — HEPATIC FUNCTION PANEL
ALT: 16 IU/L (ref 0–32)
AST: 17 IU/L (ref 0–40)
Albumin: 4.2 g/dL (ref 3.8–4.8)
Alkaline Phosphatase: 126 IU/L — ABNORMAL HIGH (ref 44–121)
Bilirubin Total: 0.3 mg/dL (ref 0.0–1.2)
Bilirubin, Direct: 0.1 mg/dL (ref 0.00–0.40)
Total Protein: 6.8 g/dL (ref 6.0–8.5)

## 2022-01-17 LAB — FERRITIN: Ferritin: 46 ng/mL (ref 15–150)

## 2022-01-20 ENCOUNTER — Telehealth: Payer: Self-pay | Admitting: Internal Medicine

## 2022-01-20 NOTE — Telephone Encounter (Signed)
Left vm mssg reporting normal results on last week's blood tests.

## 2022-01-21 ENCOUNTER — Ambulatory Visit: Payer: Medicare Other

## 2022-01-21 ENCOUNTER — Other Ambulatory Visit: Payer: Self-pay

## 2022-01-21 DIAGNOSIS — F32A Depression, unspecified: Secondary | ICD-10-CM

## 2022-01-21 MED ORDER — BUPROPION HCL ER (SR) 150 MG PO TB12: ORAL_TABLET | ORAL | 3 refills | Status: DC

## 2022-01-21 MED ORDER — LOSARTAN POTASSIUM-HCTZ 100-25 MG PO TABS: 1.0000 | ORAL_TABLET | Freq: Every day | ORAL | 3 refills | Status: DC

## 2022-01-21 NOTE — Therapy (Incomplete)
OUTPATIENT PHYSICAL THERAPY TREATMENT NOTE   Patient Name: Emma Bonilla MRN: [id number] DOB:14-Aug-1956, 66 y.o., female Today's Date: 01/21/2022  PCP: Angelica Pou, MD REFERRING PROVIDER: Angelica Pou, MD      Past Medical History:  Diagnosis Date   Anemia    Arthritis    Depression    Diastolic dysfunction without heart failure    Family history of rectal cancer    GERD (gastroesophageal reflux disease)    Hepatic steatosis    a. seen on prior US.   Hyperlipidemia    Hypertension    Mild aortic regurgitation    Mild aortic stenosis    Premature atrial contractions    PVC's (premature ventricular contractions)    Thyroid disease    TIA (transient ischemic attack)    2010   Tobacco abuse    Past Surgical History:  Procedure Laterality Date   CORONARY STENT INTERVENTION N/A 10/13/2017   Procedure: CORONARY STENT INTERVENTION;  Surgeon: Leonie Man, MD;  Location: [facility];  Service: Cardiovascular;  Laterality: N/A;   DILATION AND CURETTAGE OF UTERUS     30 years ago   FOOT SURGERY     LEFT HEART CATH AND CORONARY ANGIOGRAPHY N/A 10/13/2017   Procedure: LEFT HEART CATH AND CORONARY ANGIOGRAPHY;  Surgeon: Leonie Man, MD;  Location: [facility];  Service: Cardiovascular;  Laterality: N/A;   Patient Active Problem List   Diagnosis Date Noted   Family history of rectal cancer 01/16/2022   Chronic insomnia 01/16/2022   Hepatic steatosis 04/30/2021   Hx of adenomatous colonic polyps 04/18/2021   Patellar subluxation 03/03/2021   Numbness and tingling in both hands 04/23/2020   Prediabetes 04/23/2020   Mouth dryness due to TCA 01/16/2020   Obstructive sleep apnea hypopnea, moderate 06/20/2019   Obesity 10/13/2017   CAD S/P percutaneous coronary angioplasty 10/2017 10/12/2017   Iron deficiency anemia 03/27/2017   Lipoma 02/14/2017   Internal hemorrhoids 02/13/2017   TIA (transient ischemic attack) 08/21/2015   Aortic  valve stenosis 06/11/2015   Dental caries 05/09/2013   Perennial allergic rhinitis 04/03/2012   Hypothyroidism 03/30/2012   Osteoarthritis 11/07/2011   Antiplatelet or antithrombotic long-term use 09/01/2011   Essential hypertension 07/25/2011   Reaction, adjustment, with depressed mood, prolonged 07/25/2011   Gastroesophageal reflux disease 07/25/2011   Tobacco abuse 07/25/2011   Hyperlipidemia 07/25/2011    REFERRING DIAG: R26.89 (ICD-10-CM) - Balance problem  THERAPY DIAG:  No diagnosis found.  PERTINENT HISTORY: Chronic lower back and R hip pain with increasing imbalance and unsteadiness following CVA in 2012  PRECAUTIONS: Fall  SUBJECTIVE:  ***  PAIN:  Are you having pain? Yes NPRS scale: 3/10  Pain location: R knee PAIN TYPE: sharp Pain description: intermittent  Aggravating factors: prolonged walking/standing, stairs Relieving factors: rest, heat   OBJECTIVE:     PATIENT SURVEYS:  FOTO 41% function; 51% predicted   LE MMT:   MMT Right 01/01/2022 Left 01/01/2022  Hip flexion (L2, L3) 3/5 4/5  Knee extension (L3) 5/5 5/5  Knee flexion 4/5 4/5  Hip abduction 3/5 3+/5  Hip adduction 3/5 3/5    (Blank rows = not tested, score listed is out of 5 possible points.  N = WNL, D = diminished, C = clear for gross weakness with myotome testing, * = concordant pain with testing)      FUNCTIONAL TESTS:  30 Second Sit to Stand: 12 reps SLS: 3 seconds Rt 01/14/22 30 sec STS:  12 reps SLS: 10 sec Rt (hands hovering over FM bar)   GAIT: Distance walked: 61ft Assistive device utilized: None Level of assistance: Complete Independence Comments: antalgic gait R LE   TODAY'S TREATMENT  [facility] Adult PT Treatment:                                                DATE: 01/21/2022 Therapeutic Exercise: Nustep level 6 x 5 min while taking subjective Supine clamshell  BTB 2x15 Supine SLR 2 x 10 BIL 2# Seated march x 10 BIL Seated knee flexion GTB x 10 BIL STS 2x10 - no UE  supprot Standing hip abd/ext 2x10 2# Standing hamstring curl at FM x 10 BIL Neuromuscular re-ed: Tandem stance BIL x 30 sec ea Tandem walk in // x 2 laps SLS x 30 sec each  [facility] Adult PT Treatment:                                                DATE: 01/16/2022 Therapeutic Exercise: Nustep level 6 x 5 min while taking subjective Supine clamshell  BTB 2x15 Supine SLR 2 x 10 BIL 2# Seated march x 10 BIL Seated knee flexion GTB x 10 BIL STS 2x10 - no UE supprot Standing hip abd/ext 2x10 2# Standing hamstring curl at FM x 10 BIL Neuromuscular re-ed: Tandem stance BIL x 30 sec ea Tandem walk in // x 2 laps SLS x 30 sec each  [facility] Adult PT Treatment:                                                DATE: 01/14/2022 Therapeutic Exercise: Nustep level 4 x 5 mins Supine clamshell GTB 2 x 10  Supine SLR 2 x 10 BIL Supine march GTB 2 x 10 BIL Supine hip adduction ball squeeze 5 sec hold 2 x 10 Seated march x 10 BIL Seated knee flexion GTB x 10 BIL STS x 30 sec (12 reps) Standing 3 way hip at FM x 10 ea BIL Standing hamstring curl at FM x 10 BIL Mini squats at FM x 10 Neuromuscular re-ed: Tandem stance BIL x 30 sec ea Romberg stance head turns 2 x 30 sec Romberg stance eyes closed 2 x 30 sec Romberg stance with UE reaching 2 x 30 sec SLS 2 x 30 sec  PATIENT EDUCATION:  Education details: eval findings, FOTO, HEP, POC Person educated: Patient Education method: Explanation, Demonstration, and Handouts Education comprehension: verbalized understanding and returned demonstration     HOME EXERCISE PROGRAM: Access Code: [email or web address] URL: [email or web address] Date: 01/01/2022 Prepared by: Octavio Manns   Exercises Hooklying Clamshell with Resistance - 1 x daily - 7 x weekly - 3 sets - 15 reps Small Range Straight Leg Raise - 1 x daily - 7 x weekly - 2 sets - 10 reps Standing Tandem Balance with Counter Support - 1 x daily - 7 x weekly - 2 reps - 30 sec hold      ASSESSMENT: ***   GOALS: Goals reviewed with patient? No   SHORT TERM GOALS:   STG Name Target Date  Goal status  1 Pt will be compliant and knowledgeable with initial HEP for improved comfort and carryover Baseline: initial HEP given 01/22/2022 INITIAL  2 Pt will self report R hip pain no greater than 5/10 for improved comfort and functional ability Baseline: 8/10 at worst 01/22/2022 INITIAL    LONG TERM GOALS:    LTG Name Target Date Goal status  1 Pt will improve FOTO function score to no less than 51% as proxy for functional improvement Baseline: 41% 02/26/2022 INITIAL  2 Pt will self report R hip pain no greater than 3/10 for improved comfort and functional ability Baseline: 8/10 at worst 02/26/2022 INITIAL  3 Pt will be able to hold R SLS for at least 15 seconds for improving balance and safety Baseline: 3 seconds 02/26/2022 INITIAL  4 Pt will improve LE MMT to no less than 4/5 for all tested motions for improved strength and mobility Baseline: see chart 02/26/2022 INITIAL    PLAN: PT FREQUENCY: 1-2x/week   PT DURATION: 8 weeks   PLANNED INTERVENTIONS: Therapeutic exercises, Therapeutic activity, Neuro Muscular re-education, Balance training, Gait training, Patient/Family education, Joint mobilization, Aquatic Therapy, Dry Needling, Cryotherapy, Moist heat, Vasopneumatic device, and Manual therapy   PLAN FOR NEXT SESSION: assess HEP response, progress LE strength and balance as able    Ward Chatters, PT 01/21/2022, 10:17 AM

## 2022-01-21 NOTE — Telephone Encounter (Signed)
buPROPion (WELLBUTRIN SR) 150 MG 12 hr tablet  losartan-hydrochlorothiazide (HYZAAR) 100-25 MG tablet, REFILL REQUEST @ [place], [place] - [address]

## 2022-01-22 NOTE — Addendum Note (Signed)
Addended by: Marcelino Duster on: 01/22/2022 12:52 PM   Modules accepted: Orders

## 2022-01-23 ENCOUNTER — Ambulatory Visit: Payer: Medicare Other

## 2022-01-23 ENCOUNTER — Other Ambulatory Visit: Payer: Self-pay

## 2022-01-23 ENCOUNTER — Encounter: Payer: Self-pay | Admitting: Internal Medicine

## 2022-01-23 DIAGNOSIS — R2689 Other abnormalities of gait and mobility: Secondary | ICD-10-CM

## 2022-01-23 DIAGNOSIS — R2681 Unsteadiness on feet: Secondary | ICD-10-CM | POA: Diagnosis not present

## 2022-01-23 DIAGNOSIS — M6281 Muscle weakness (generalized): Secondary | ICD-10-CM

## 2022-01-23 NOTE — Therapy (Signed)
OUTPATIENT PHYSICAL THERAPY TREATMENT NOTE   Patient Name: Emma Bonilla MRN: [id number] DOB:[birth year], 66 y.o., female Today's Date: 01/23/2022  PCP: Angelica Pou, MD REFERRING PROVIDER: Angelica Pou, MD   PT End of Session - 01/23/22 1659     Visit Number 4    Number of Visits 17    Date for PT Re-Evaluation 02/26/22    Authorization Type [employer]    PT Start Time 1700    PT Stop Time 4098    PT Time Calculation (min) 40 min    Activity Tolerance Patient tolerated treatment well    Behavior During Therapy [facility] for tasks assessed/performed               Past Medical History:  Diagnosis Date   Anemia    Arthritis    Depression    Diastolic dysfunction without heart failure    Family history of rectal cancer    GERD (gastroesophageal reflux disease)    Hepatic steatosis    a. seen on prior US.   Hyperlipidemia    Hypertension    Mild aortic regurgitation    Mild aortic stenosis    Premature atrial contractions    PVC's (premature ventricular contractions)    Thyroid disease    TIA (transient ischemic attack)    2010   Tobacco abuse    Past Surgical History:  Procedure Laterality Date   CORONARY STENT INTERVENTION N/A 10/13/2017   Procedure: CORONARY STENT INTERVENTION;  Surgeon: Leonie Man, MD;  Location: [facility];  Service: Cardiovascular;  Laterality: N/A;   DILATION AND CURETTAGE OF UTERUS     30 years ago   FOOT SURGERY     LEFT HEART CATH AND CORONARY ANGIOGRAPHY N/A 10/13/2017   Procedure: LEFT HEART CATH AND CORONARY ANGIOGRAPHY;  Surgeon: Leonie Man, MD;  Location: [facility];  Service: Cardiovascular;  Laterality: N/A;   Patient Active Problem List   Diagnosis Date Noted   Family history of rectal cancer 01/16/2022   Chronic insomnia 01/16/2022   Hepatic steatosis 04/30/2021   Hx of adenomatous colonic polyps 04/18/2021   Patellar subluxation 03/03/2021   Numbness and tingling in both hands  04/23/2020   Prediabetes 04/23/2020   Mouth dryness due to TCA 01/16/2020   Obstructive sleep apnea hypopnea, moderate 06/20/2019   Obesity 10/13/2017   CAD S/P percutaneous coronary angioplasty 10/2017 10/12/2017   Iron deficiency anemia 03/27/2017   Lipoma 02/14/2017   Internal hemorrhoids 02/13/2017   TIA (transient ischemic attack) 08/21/2015   Aortic valve stenosis 06/11/2015   Dental caries 05/09/2013   Perennial allergic rhinitis 04/03/2012   Hypothyroidism 03/30/2012   Osteoarthritis 11/07/2011   Antiplatelet or antithrombotic long-term use 09/01/2011   Essential hypertension 07/25/2011   Reaction, adjustment, with depressed mood, prolonged 07/25/2011   Gastroesophageal reflux disease 07/25/2011   Tobacco abuse 07/25/2011   Hyperlipidemia 07/25/2011    REFERRING DIAG: R26.89 (ICD-10-CM) - Balance problem  THERAPY DIAG:  Unsteadiness on feet  Muscle weakness (generalized)  Other abnormalities of gait and mobility  PERTINENT HISTORY: Chronic lower back and R hip pain with increasing imbalance and unsteadiness following CVA in 2012  PRECAUTIONS: Fall  SUBJECTIVE:  Pt presents to PT with reports of continued decrease in R knee pain. Has been compliant with HEP with no adverse effect. Pt is ready to begin PT at this time.   PAIN:  Are you having pain? Yes NPRS scale: 3/10  Pain location: R knee  PAIN TYPE: sharp Pain description: intermittent  Aggravating factors: prolonged walking/standing, stairs Relieving factors: rest, heat   OBJECTIVE:     PATIENT SURVEYS:  FOTO 41% function; 51% predicted   LE MMT:   MMT Right 01/01/2022 Left 01/01/2022  Hip flexion (L2, L3) 3/5 4/5  Knee extension (L3) 5/5 5/5  Knee flexion 4/5 4/5  Hip abduction 3/5 3+/5  Hip adduction 3/5 3/5    (Blank rows = not tested, score listed is out of 5 possible points.  N = WNL, D = diminished, C = clear for gross weakness with myotome testing, * = concordant pain with testing)       FUNCTIONAL TESTS:  30 Second Sit to Stand: 12 reps SLS: 3 seconds Rt 01/14/22 30 sec STS: 12 reps SLS: 10 sec Rt (hands hovering over FM bar)   GAIT: Distance walked: 17f Assistive device utilized: None Level of assistance: Complete Independence Comments: antalgic gait R LE   TODAY'S TREATMENT  [facility] Adult PT Treatment:                                                DATE: 01/23/2022 Therapeutic Exercise: Nustep level 6 x 5 min while taking subjective Standing hip abd/ext 2x10 each RTB TKE with small ball at wall 2x10 - 5" hold Supine SLR 2 x 10 BIL 2.5# LAQ 2x10 5# STS 2x10 - no UE support; cues to reduce knee hyperextension Neuromuscular re-ed: Tandem stance BIL x 30 sec ea  [facility] Adult PT Treatment:                                                DATE: 01/16/2022 Therapeutic Exercise: Nustep level 6 x 5 min while taking subjective Supine clamshell  BTB 2x15 Supine SLR 2 x 10 BIL 2# Seated march x 10 BIL Seated knee flexion GTB x 10 BIL STS 2x10 - no UE supprot Standing hip abd/ext 2x10 2# Standing hamstring curl at FM x 10 BIL Neuromuscular re-ed: Tandem stance BIL x 30 sec ea Tandem walk in // x 2 laps SLS x 30 sec each  [facility] Adult PT Treatment:                                                DATE: 01/14/2022 Therapeutic Exercise: Nustep level 4 x 5 mins Supine clamshell GTB 2 x 10  Supine SLR 2 x 10 BIL Supine march GTB 2 x 10 BIL Supine hip adduction ball squeeze 5 sec hold 2 x 10 Seated march x 10 BIL Seated knee flexion GTB x 10 BIL STS x 30 sec (12 reps) Standing 3 way hip at FM x 10 ea BIL Standing hamstring curl at FM x 10 BIL Mini squats at FM x 10 Neuromuscular re-ed: Tandem stance BIL x 30 sec ea Romberg stance head turns 2 x 30 sec Romberg stance eyes closed 2 x 30 sec Romberg stance with UE reaching 2 x 30 sec SLS 2 x 30 sec  PATIENT EDUCATION:  Education details: eval findings, FOTO, HEP, POC Person educated: Patient Education method:  Explanation, Demonstration,  and Handouts Education comprehension: verbalized understanding and returned demonstration     HOME EXERCISE PROGRAM: Access Code: [email or web address] URL: [email or web address] Date: 01/01/2022 Prepared by: Octavio Manns   Exercises Hooklying Clamshell with Resistance - 1 x daily - 7 x weekly - 3 sets - 15 reps Small Range Straight Leg Raise - 1 x daily - 7 x weekly - 2 sets - 10 reps Standing Tandem Balance with Counter Support - 1 x daily - 7 x weekly - 2 reps - 30 sec hold     ASSESSMENT: Pt was able to complete all prescribed exercises with no adverse effect or increase in pain. Therapy today continued to focus on improving LE strength and balance in order to improve mobility and safety. Pt continues to progress well with therapy and will continue to be seen and progressed as tolerated.    GOALS: Goals reviewed with patient? No   SHORT TERM GOALS:   STG Name Target Date Goal status  1 Pt will be compliant and knowledgeable with initial HEP for improved comfort and carryover Baseline: initial HEP given 01/22/2022 MET  2 Pt will self report R hip pain no greater than 5/10 for improved comfort and functional ability Baseline: 8/10 at worst 01/22/2022 MET    LONG TERM GOALS:    LTG Name Target Date Goal status  1 Pt will improve FOTO function score to no less than 51% as proxy for functional improvement Baseline: 41% 02/26/2022 INITIAL  2 Pt will self report R hip pain no greater than 3/10 for improved comfort and functional ability Baseline: 8/10 at worst 02/26/2022 INITIAL  3 Pt will be able to hold R SLS for at least 15 seconds for improving balance and safety Baseline: 3 seconds 02/26/2022 INITIAL  4 Pt will improve LE MMT to no less than 4/5 for all tested motions for improved strength and mobility Baseline: see chart 02/26/2022 INITIAL    PLAN: PT FREQUENCY: 1-2x/week   PT DURATION: 8 weeks   PLANNED INTERVENTIONS: Therapeutic exercises,  Therapeutic activity, Neuro Muscular re-education, Balance training, Gait training, Patient/Family education, Joint mobilization, Aquatic Therapy, Dry Needling, Cryotherapy, Moist heat, Vasopneumatic device, and Manual therapy   PLAN FOR NEXT SESSION: assess HEP response, progress LE strength and balance as able    Ward Chatters, PT 01/23/2022, 5:43 PM

## 2022-01-28 ENCOUNTER — Ambulatory Visit: Payer: Medicare Other

## 2022-01-28 ENCOUNTER — Other Ambulatory Visit: Payer: Self-pay

## 2022-01-28 DIAGNOSIS — R2681 Unsteadiness on feet: Secondary | ICD-10-CM | POA: Diagnosis not present

## 2022-01-28 DIAGNOSIS — M6281 Muscle weakness (generalized): Secondary | ICD-10-CM

## 2022-01-28 NOTE — Therapy (Addendum)
OUTPATIENT PHYSICAL THERAPY TREATMENT NOTE   Patient Name: Emma Bonilla MRN: [id number] DOB:28-Mar-1956, 66 y.o., female Today's Date: 01/28/2022  PCP: Angelica Pou, MD REFERRING PROVIDER: Angelica Pou, MD   PT End of Session - 01/28/22 1656     Visit Number 5    Number of Visits 17    Date for PT Re-Evaluation 02/26/22    Authorization Type [employer] Medicare    PT Start Time 1700    PT Stop Time 7096    PT Time Calculation (min) 40 min    Activity Tolerance Patient tolerated treatment well    Behavior During Therapy [facility] for tasks assessed/performed                Past Medical History:  Diagnosis Date   Anemia    Arthritis    Depression    Diastolic dysfunction without heart failure    Family history of rectal cancer    GERD (gastroesophageal reflux disease)    Hepatic steatosis    a. seen on prior US.   Hyperlipidemia    Hypertension    Mild aortic regurgitation    Mild aortic stenosis    Premature atrial contractions    PVC's (premature ventricular contractions)    Thyroid disease    TIA (transient ischemic attack)    2010   Tobacco abuse    Past Surgical History:  Procedure Laterality Date   CORONARY STENT INTERVENTION N/A 10/13/2017   Procedure: CORONARY STENT INTERVENTION;  Surgeon: Leonie Man, MD;  Location: [place] [facility];  Service: Cardiovascular;  Laterality: N/A;   DILATION AND CURETTAGE OF UTERUS     30 years ago   FOOT SURGERY     LEFT HEART CATH AND CORONARY ANGIOGRAPHY N/A 10/13/2017   Procedure: LEFT HEART CATH AND CORONARY ANGIOGRAPHY;  Surgeon: Leonie Man, MD;  Location: [place] [facility];  Service: Cardiovascular;  Laterality: N/A;   Patient Active Problem List   Diagnosis Date Noted   Family history of rectal cancer 01/16/2022   Chronic insomnia 01/16/2022   Hepatic steatosis 04/30/2021   Hx of adenomatous colonic polyps 04/18/2021   Patellar subluxation 03/03/2021   Numbness and tingling in both  hands 04/23/2020   Prediabetes 04/23/2020   Mouth dryness due to TCA 01/16/2020   Obstructive sleep apnea hypopnea, moderate 06/20/2019   Obesity 10/13/2017   CAD S/P percutaneous coronary angioplasty 10/2017 10/12/2017   Iron deficiency anemia 03/27/2017   Lipoma 02/14/2017   Internal hemorrhoids 02/13/2017   TIA (transient ischemic attack) 08/21/2015   Aortic valve stenosis 06/11/2015   Dental caries 05/09/2013   Perennial allergic rhinitis 04/03/2012   Hypothyroidism 03/30/2012   Osteoarthritis 11/07/2011   Antiplatelet or antithrombotic long-term use 09/01/2011   Essential hypertension 07/25/2011   Reaction, adjustment, with depressed mood, prolonged 07/25/2011   Gastroesophageal reflux disease 07/25/2011   Tobacco abuse 07/25/2011   Hyperlipidemia 07/25/2011    REFERRING DIAG: R26.89 (ICD-10-CM) - Balance problem  THERAPY DIAG:  Unsteadiness on feet  Muscle weakness (generalized)  PERTINENT HISTORY: Chronic lower back and R hip pain with increasing imbalance and unsteadiness following CVA in 2012  PRECAUTIONS: Fall  SUBJECTIVE:  Pt presents to PT with reports of decreased pain today, noting no pain at present. Has been compliant with her HEP with no adverse effect. Pt is ready to begin PT at this time.   PAIN:  Are you having pain? Yes NPRS scale: 0/10  Pain location: R knee PAIN TYPE: sharp  Pain description: intermittent  Aggravating factors: prolonged walking/standing, stairs Relieving factors: rest, heat   OBJECTIVE:     PATIENT SURVEYS:  FOTO 41% function; 51% predicted   LE MMT:   MMT Right 01/01/2022 Left 01/01/2022  Hip flexion (L2, L3) 3/5 4/5  Knee extension (L3) 5/5 5/5  Knee flexion 4/5 4/5  Hip abduction 3/5 3+/5  Hip adduction 3/5 3/5    (Blank rows = not tested, score listed is out of 5 possible points.  N = WNL, D = diminished, C = clear for gross weakness with myotome testing, * = concordant pain with testing)      FUNCTIONAL TESTS:   30 Second Sit to Stand: 12 reps SLS: 3 seconds Rt 01/14/22 30 sec STS: 12 reps SLS: 10 sec Rt (hands hovering over FM bar)   GAIT: Distance walked: 51f Assistive device utilized: None Level of assistance: Complete Independence Comments: antalgic gait R LE   TODAY'S TREATMENT  [facility] Adult PT Treatment:                                                DATE: 01/28/2022 Therapeutic Exercise: Nustep level 6 x 5 min while taking subjective Lateral walk at coutner x 3 laps YTB Standing hip ext 2x10 YTB Total gym knee ext 2x10 15# TKE with small ball at wall 2x10 - 5" hold Supine SLR 2 x 10 BIL 2.5# Bridge 2x10  Supine clamshell BTB 2x20 Supine march 2x20 BTB STS 2x10 - no UE support; cues to reduce knee hyperextension Neuromuscular re-ed: Tandem stance BIL x 30 sec ea SLS x 30" each  [facility] Adult PT Treatment:                                                DATE: 01/16/2022 Therapeutic Exercise: Nustep level 6 x 5 min while taking subjective Supine clamshell  BTB 2x15 Supine SLR 2 x 10 BIL 2# Seated march x 10 BIL Seated knee flexion GTB x 10 BIL STS 2x10 - no UE supprot Standing hip abd/ext 2x10 2# Standing hamstring curl at FM x 10 BIL Neuromuscular re-ed: Tandem stance BIL x 30 sec ea Tandem walk in // x 2 laps SLS x 30 sec each  [facility] Adult PT Treatment:                                                DATE: 01/14/2022 Therapeutic Exercise: Nustep level 4 x 5 mins Supine clamshell GTB 2 x 10  Supine SLR 2 x 10 BIL Supine march GTB 2 x 10 BIL Supine hip adduction ball squeeze 5 sec hold 2 x 10 Seated march x 10 BIL Seated knee flexion GTB x 10 BIL STS x 30 sec (12 reps) Standing 3 way hip at FM x 10 ea BIL Standing hamstring curl at FM x 10 BIL Mini squats at FM x 10 Neuromuscular re-ed: Tandem stance BIL x 30 sec ea Romberg stance head turns 2 x 30 sec Romberg stance eyes closed 2 x 30 sec Romberg stance with UE reaching 2 x 30 sec SLS 2  x 30 sec  PATIENT EDUCATION:   Education details: eval findings, FOTO, HEP, POC Person educated: Patient Education method: Explanation, Demonstration, and Handouts Education comprehension: verbalized understanding and returned demonstration     HOME EXERCISE PROGRAM: Access Code: [email or web address] URL: [email or web address] Date: 01/01/2022 Prepared by: Octavio Manns   Exercises Hooklying Clamshell with Resistance - 1 x daily - 7 x weekly - 3 sets - 15 reps Small Range Straight Leg Raise - 1 x daily - 7 x weekly - 2 sets - 10 reps Standing Tandem Balance with Counter Support - 1 x daily - 7 x weekly - 2 reps - 30 sec hold     ASSESSMENT: Pt was able to again complete all prescribed exercises with no adverse effect. Therapy today continue to focus on global LE strengthening and improving balance. She continues to progress well with therapy, showing improving strength and activity tolerance. Pt is progressing well with therapy and will continue to be seen and progressed as tolerated.    GOALS: Goals reviewed with patient? No   SHORT TERM GOALS:   STG Name Target Date Goal status  1 Pt will be compliant and knowledgeable with initial HEP for improved comfort and carryover Baseline: initial HEP given 01/22/2022 MET  2 Pt will self report R hip pain no greater than 5/10 for improved comfort and functional ability Baseline: 8/10 at worst 01/22/2022 MET    LONG TERM GOALS:    LTG Name Target Date Goal status  1 Pt will improve FOTO function score to no less than 51% as proxy for functional improvement Baseline: 41% 02/26/2022 INITIAL  2 Pt will self report R hip pain no greater than 3/10 for improved comfort and functional ability Baseline: 8/10 at worst 02/26/2022 INITIAL  3 Pt will be able to hold R SLS for at least 15 seconds for improving balance and safety Baseline: 3 seconds 02/26/2022 INITIAL  4 Pt will improve LE MMT to no less than 4/5 for all tested motions for improved strength and mobility Baseline:  see chart 02/26/2022 INITIAL    PLAN: PT FREQUENCY: 1-2x/week   PT DURATION: 8 weeks   PLANNED INTERVENTIONS: Therapeutic exercises, Therapeutic activity, Neuro Muscular re-education, Balance training, Gait training, Patient/Family education, Joint mobilization, Aquatic Therapy, Dry Needling, Cryotherapy, Moist heat, Vasopneumatic device, and Manual therapy   PLAN FOR NEXT SESSION: assess HEP response, progress LE strength and balance as able    Ward Chatters, PT 01/28/2022, 5:40 PM

## 2022-01-30 ENCOUNTER — Ambulatory Visit: Payer: Medicare Other | Attending: Internal Medicine

## 2022-01-30 ENCOUNTER — Other Ambulatory Visit: Payer: Self-pay

## 2022-01-30 DIAGNOSIS — M6281 Muscle weakness (generalized): Secondary | ICD-10-CM | POA: Diagnosis present

## 2022-01-30 DIAGNOSIS — R2681 Unsteadiness on feet: Secondary | ICD-10-CM | POA: Diagnosis present

## 2022-01-30 DIAGNOSIS — R2689 Other abnormalities of gait and mobility: Secondary | ICD-10-CM | POA: Insufficient documentation

## 2022-01-30 NOTE — Therapy (Signed)
OUTPATIENT PHYSICAL THERAPY TREATMENT NOTE   Patient Name: Emma Bonilla MRN: [id number] DOB:[birth year], 66 y.o., female Today's Date: 01/30/2022  PCP: Angelica Pou, MD REFERRING PROVIDER: Angelica Pou, MD   PT End of Session - 01/30/22 1653     Visit Number 6    Number of Visits 17    Date for PT Re-Evaluation 02/26/22    Authorization Type [employer] Medicare    PT Start Time 6803    PT Stop Time 1738    PT Time Calculation (min) 40 min    Activity Tolerance Patient tolerated treatment well    Behavior During Therapy [facility] for tasks assessed/performed                 Past Medical History:  Diagnosis Date   Anemia    Arthritis    Depression    Diastolic dysfunction without heart failure    Family history of rectal cancer    GERD (gastroesophageal reflux disease)    Hepatic steatosis    a. seen on prior US.   Hyperlipidemia    Hypertension    Mild aortic regurgitation    Mild aortic stenosis    Premature atrial contractions    PVC's (premature ventricular contractions)    Thyroid disease    TIA (transient ischemic attack)    2010   Tobacco abuse    Past Surgical History:  Procedure Laterality Date   CORONARY STENT INTERVENTION N/A 10/13/2017   Procedure: CORONARY STENT INTERVENTION;  Surgeon: Leonie Man, MD;  Location: [place] [facility];  Service: Cardiovascular;  Laterality: N/A;   DILATION AND CURETTAGE OF UTERUS     30 years ago   FOOT SURGERY     LEFT HEART CATH AND CORONARY ANGIOGRAPHY N/A 10/13/2017   Procedure: LEFT HEART CATH AND CORONARY ANGIOGRAPHY;  Surgeon: Leonie Man, MD;  Location: [place] [facility];  Service: Cardiovascular;  Laterality: N/A;   Patient Active Problem List   Diagnosis Date Noted   Family history of rectal cancer 01/16/2022   Chronic insomnia 01/16/2022   Hepatic steatosis 04/30/2021   Hx of adenomatous colonic polyps 04/18/2021   Patellar subluxation 03/03/2021   Numbness and tingling in both  hands 04/23/2020   Prediabetes 04/23/2020   Mouth dryness due to TCA 01/16/2020   Obstructive sleep apnea hypopnea, moderate 06/20/2019   Obesity 10/13/2017   CAD S/P percutaneous coronary angioplasty 10/2017 10/12/2017   Iron deficiency anemia 03/27/2017   Lipoma 02/14/2017   Internal hemorrhoids 02/13/2017   TIA (transient ischemic attack) 08/21/2015   Aortic valve stenosis 06/11/2015   Dental caries 05/09/2013   Perennial allergic rhinitis 04/03/2012   Hypothyroidism 03/30/2012   Osteoarthritis 11/07/2011   Antiplatelet or antithrombotic long-term use 09/01/2011   Essential hypertension 07/25/2011   Reaction, adjustment, with depressed mood, prolonged 07/25/2011   Gastroesophageal reflux disease 07/25/2011   Tobacco abuse 07/25/2011   Hyperlipidemia 07/25/2011    REFERRING DIAG: R26.89 (ICD-10-CM) - Balance problem  THERAPY DIAG:  Unsteadiness on feet  Muscle weakness (generalized)  Other abnormalities of gait and mobility  PERTINENT HISTORY: Chronic lower back and R hip pain with increasing imbalance and unsteadiness following CVA in 2012  PRECAUTIONS: Fall  SUBJECTIVE:  Pt presents to PT with reports of R knee pain. Thinks this is due to the rainy weather moving in. Pt is ready to begin PT treatment at this time.   PAIN:  Are you having pain? Yes NPRS scale: 2/10  Pain location: R  knee PAIN TYPE: sharp Pain description: intermittent  Aggravating factors: prolonged walking/standing, stairs Relieving factors: rest, heat   OBJECTIVE:     PATIENT SURVEYS:  FOTO 41% function; 51% predicted   LE MMT:   MMT Right 01/01/2022 Left 01/01/2022  Hip flexion (L2, L3) 3/5 4/5  Knee extension (L3) 5/5 5/5  Knee flexion 4/5 4/5  Hip abduction 3/5 3+/5  Hip adduction 3/5 3/5    (Blank rows = not tested, score listed is out of 5 possible points.  N = WNL, D = diminished, C = clear for gross weakness with myotome testing, * = concordant pain with testing)       FUNCTIONAL TESTS:  30 Second Sit to Stand: 12 reps SLS: 3 seconds Rt 01/14/22 30 sec STS: 12 reps SLS: 10 sec Rt (hands hovering over FM bar)   GAIT: Distance walked: 46f Assistive device utilized: None Level of assistance: Complete Independence Comments: antalgic gait R LE   TODAY'S TREATMENT  [facility] Adult PT Treatment:                                                DATE: 01/30/2022 Therapeutic Exercise: Nustep level 6 x 5 min while taking subjective Lateral walk in // x 3 laps RTB Standing hip ext 2x10 RTB Total gym knee ext 2x10 15# Total gym knee flex 2x10 25# STS 2x10 - no UE support; cues to reduce knee hyperextension Neuromuscular re-ed: Tandem walk x 2 laps in // Tandem stance on foam BIL x 30 sec ea Hurdle (6) step over fwd x 3 laps in // Hurdle (6) step over lat x 2 laps in //  PATIENT EDUCATION:  Education details: eval findings, FOTO, HEP, POC Person educated: Patient Education method: Explanation, Demonstration, and Handouts Education comprehension: verbalized understanding and returned demonstration     HOME EXERCISE PROGRAM: Access Code: [email or web address] URL: [email or web address] Date: 01/01/2022 Prepared by: DOctavio Manns  Exercises Hooklying Clamshell with Resistance - 1 x daily - 7 x weekly - 3 sets - 15 reps Small Range Straight Leg Raise - 1 x daily - 7 x weekly - 2 sets - 10 reps Standing Tandem Balance with Counter Support - 1 x daily - 7 x weekly - 2 reps - 30 sec hold     ASSESSMENT: Pt was able to complete all prescribed exercises with no adverse effect or increase in pain. Therapy today again focused on improving LE strength and balance. She continues to demonstrate improved strength and functional ability. Pt is progressing well with PT and will continue to be seen and progressed as tolerated.    GOALS: Goals reviewed with patient? No   SHORT TERM GOALS:   STG Name Target Date Goal status  1 Pt will be compliant and knowledgeable  with initial HEP for improved comfort and carryover Baseline: initial HEP given 01/22/2022 MET  2 Pt will self report R hip pain no greater than 5/10 for improved comfort and functional ability Baseline: 8/10 at worst 01/22/2022 MET    LONG TERM GOALS:    LTG Name Target Date Goal status  1 Pt will improve FOTO function score to no less than 51% as proxy for functional improvement Baseline: 41% 02/26/2022 INITIAL  2 Pt will self report R hip pain no greater than 3/10 for improved comfort and functional ability Baseline: 8/10 at worst 02/26/2022  INITIAL  3 Pt will be able to hold R SLS for at least 15 seconds for improving balance and safety Baseline: 3 seconds 02/26/2022 INITIAL  4 Pt will improve LE MMT to no less than 4/5 for all tested motions for improved strength and mobility Baseline: see chart 02/26/2022 INITIAL    PLAN: PT FREQUENCY: 1-2x/week   PT DURATION: 8 weeks   PLANNED INTERVENTIONS: Therapeutic exercises, Therapeutic activity, Neuro Muscular re-education, Balance training, Gait training, Patient/Family education, Joint mobilization, Aquatic Therapy, Dry Needling, Cryotherapy, Moist heat, Vasopneumatic device, and Manual therapy   PLAN FOR NEXT SESSION: assess HEP response, progress LE strength and balance as able    Ward Chatters, PT 01/30/2022, 5:53 PM

## 2022-02-03 MED ORDER — EZETIMIBE 10 MG PO TABS: 10.0000 mg | ORAL_TABLET | Freq: Every day | ORAL | 1 refills | Status: DC

## 2022-02-03 NOTE — Addendum Note (Signed)
Addended by: Patria Mane A on: 02/03/2022 07:55 AM ? ? Modules accepted: Orders ? ?

## 2022-02-04 ENCOUNTER — Ambulatory Visit: Payer: Medicare Other

## 2022-02-05 ENCOUNTER — Telehealth: Payer: Self-pay | Admitting: Dietician

## 2022-02-05 NOTE — Telephone Encounter (Signed)
Left voicemail for return call about prediabetes virtual classes ?

## 2022-02-10 ENCOUNTER — Ambulatory Visit: Payer: Medicare Other

## 2022-02-10 ENCOUNTER — Encounter: Payer: Self-pay | Admitting: Gastroenterology

## 2022-02-10 NOTE — Therapy (Incomplete)
?OUTPATIENT PHYSICAL THERAPY TREATMENT NOTE ? ? ?Patient Name: Emma Bonilla ?MRN: [id number] ?DOB:1956/04/11, 66 y.o., female ?Today's Date: 02/10/2022 ? ?PCP: Angelica Pou, MD ?REFERRING PROVIDER: Angelica Pou, MD ? ? ? ? ? ? ? ? ?Past Medical History:  ?Diagnosis Date  ? Anemia   ? Arthritis   ? Depression   ? Diastolic dysfunction without heart failure   ? Family history of rectal cancer   ? GERD (gastroesophageal reflux disease)   ? Hepatic steatosis   ? a. seen on prior US.  ? Hyperlipidemia   ? Hypertension   ? Mild aortic regurgitation   ? Mild aortic stenosis   ? Premature atrial contractions   ? PVC's (premature ventricular contractions)   ? Thyroid disease   ? TIA (transient ischemic attack)   ? 2010  ? Tobacco abuse   ? ?Past Surgical History:  ?Procedure Laterality Date  ? CORONARY STENT INTERVENTION N/A 10/13/2017  ? Procedure: CORONARY STENT INTERVENTION;  Surgeon: Leonie Man, MD;  Location: [place] [facility];  Service: Cardiovascular;  Laterality: N/A;  ? DILATION AND CURETTAGE OF UTERUS    ? 30 years ago  ? FOOT SURGERY    ? LEFT HEART CATH AND CORONARY ANGIOGRAPHY N/A 10/13/2017  ? Procedure: LEFT HEART CATH AND CORONARY ANGIOGRAPHY;  Surgeon: Leonie Man, MD;  Location: [place] [facility];  Service: Cardiovascular;  Laterality: N/A;  ? ?Patient Active Problem List  ? Diagnosis Date Noted  ? Family history of rectal cancer 01/16/2022  ? Chronic insomnia 01/16/2022  ? Hepatic steatosis 04/30/2021  ? Hx of adenomatous colonic polyps 04/18/2021  ? Patellar subluxation 03/03/2021  ? Numbness and tingling in both hands 04/23/2020  ? Prediabetes 04/23/2020  ? Mouth dryness due to TCA 01/16/2020  ? Obstructive sleep apnea hypopnea, moderate 06/20/2019  ? Obesity 10/13/2017  ? CAD S/P percutaneous coronary angioplasty 10/2017 10/12/2017  ? Iron deficiency anemia 03/27/2017  ? Lipoma 02/14/2017  ? Internal hemorrhoids 02/13/2017  ? TIA (transient ischemic attack) 08/21/2015  ?  Aortic valve stenosis 06/11/2015  ? Dental caries 05/09/2013  ? Perennial allergic rhinitis 04/03/2012  ? Hypothyroidism 03/30/2012  ? Osteoarthritis 11/07/2011  ? Antiplatelet or antithrombotic long-term use 09/01/2011  ? Essential hypertension 07/25/2011  ? Reaction, adjustment, with depressed mood, prolonged 07/25/2011  ? Gastroesophageal reflux disease 07/25/2011  ? Tobacco abuse 07/25/2011  ? Hyperlipidemia 07/25/2011  ? ? ?REFERRING DIAG: R26.89 (ICD-10-CM) - Balance problem ? ?THERAPY DIAG:  ?No diagnosis found. ? ?PERTINENT HISTORY: Chronic lower back and R hip pain with increasing imbalance and unsteadiness following CVA in 2012 ? ?PRECAUTIONS: Fall ? ?SUBJECTIVE:  ?*** ? ?PAIN:  ?Are you having pain? Yes ?NPRS scale: 2/10  ?Pain location: R knee ?PAIN TYPE: sharp ?Pain description: intermittent  ?Aggravating factors: prolonged walking/standing, stairs ?Relieving factors: rest, heat ? ? ?OBJECTIVE:  ?   ?PATIENT SURVEYS:  ?FOTO 41% function; 51% predicted ?  ?LE MMT: ?  ?MMT Right ?01/01/2022 Left ?01/01/2022  ?Hip flexion (L2, L3) 3/5 4/5  ?Knee extension (L3) 5/5 5/5  ?Knee flexion 4/5 4/5  ?Hip abduction 3/5 3+/5  ?Hip adduction 3/5 3/5  ?  ?(Blank rows = not tested, score listed is out of 5 possible points.  N = WNL, D = diminished, C = clear for gross weakness with myotome testing, * = concordant pain with testing)  ?  ?  ?FUNCTIONAL TESTS:  ?30 Second Sit to Stand: 12 reps ?SLS: 3 seconds Rt ?01/14/22  30 sec STS: 12 reps ?SLS: 10 sec Rt (hands hovering over FM bar) ?  ?GAIT: ?Distance walked: 43f ?Assistive device utilized: None ?Level of assistance: Complete Independence ?Comments: antalgic gait R LE ?  ?[facility]RE[facility]  [facility]                                                [facility] ?Therapeutic Exercise: ?Nustep level 6 x 5 min while taking subjective ?Lateral walk in // x 3 laps RTB ?Standing hip ext 2x10 RTB ?Total gym knee ext 2x10 15# ?Total gym knee flex 2x10 25# ?STS  2x10 - no UE support; cues to reduce knee hyperextension ?Neuromuscular re-ed: ?Tandem walk x 2 laps in // ?Tandem stance on foam BIL x 30 sec ea ?Hurdle (6) step over fwd x 3 laps in // ?Hurdle (6) step over lat x 2 laps in // ? [facility]                                                [facility] ?Therapeutic Exercise: ?Nustep level 6 x 5 min while taking subjective ?Lateral walk in // x 3 laps RTB ?Standing hip ext 2x10 RTB ?Total gym knee ext 2x10 15# ?Total gym knee flex 2x10 25# ?STS 2x10 - no UE support; cues to reduce knee hyperextension ?Neuromuscular re-ed: ?Tandem walk x 2 laps in // ?Tandem stance on foam BIL x 30 sec ea ?Hurdle (6) step over fwd x 3 laps in // ?Hurdle (6) step over lat x 2 laps in // ? ?PATIENT EDUCATION:  ?Education details: eval findings, FOTO, HEP, POC ?Person educated: Patient ?Education method: Explanation, Demonstration, and Handouts ?Education comprehension: verbalized understanding and returned demonstration ?  ?  ?HOME EXERCISE PROGRAM: ?Access Code: [email or web address] ?URL: [email or web address] ?Date: 01/01/2022 ?Prepared by: DOctavio Manns?  ?Exercises ?Hooklying Clamshell with Resistance - 1 x daily - 7 x weekly - 3 sets - 15 reps ?Small Range Straight Leg Raise - 1 x daily - 7 x weekly - 2 sets - 10 reps ?Standing Tandem Balance with Counter Support - 1 x daily - 7 x weekly - 2 reps - 30 sec hold ?  ?  ?ASSESSMENT: ?*** ?  ?GOALS: ?Goals reviewed with patient? No ?  ?SHORT TERM GOALS: ?  ?STG Name Target Date Goal status  ?1 Pt will be compliant and knowledgeable with initial HEP for improved comfort and carryover ?Baseline: initial HEP given 01/22/2022 MET  ?2 Pt will self report R hip pain no greater than 5/10 for improved comfort and functional ability ?Baseline: 8/10 at worst 01/22/2022 MET  ?  ?LONG TERM GOALS:  ?  ?LTG Name Target Date Goal status  ?1 Pt will improve FOTO function score to no less than 51% as proxy for functional  improvement ?Baseline: 41% 02/26/2022 INITIAL  ?2 Pt will self report R hip pain no greater than 3/10 for improved comfort and functional ability ?Baseline: 8/10 at worst 02/26/2022 INITIAL  ?3 Pt will be able to hold R SLS for at least 15 seconds for improving balance and safety ?Baseline: 3 seconds 02/26/2022 INITIAL  ?4 Pt will improve LE MMT to no less than 4/5 for all tested  motions for improved strength and mobility ?Baseline: see chart 02/26/2022 INITIAL  ?  ?PLAN: ?PT FREQUENCY: 1-2x/week ?  ?PT DURATION: 8 weeks ?  ?PLANNED INTERVENTIONS: Therapeutic exercises, Therapeutic activity, Neuro Muscular re-education, Balance training, Gait training, Patient/Family education, Joint mobilization, Aquatic Therapy, Dry Needling, Cryotherapy, Moist heat, Vasopneumatic device, and Manual therapy ?  ?PLAN FOR NEXT SESSION: assess HEP response, progress LE strength and balance as able ? ? ? ?Ward Chatters, PT ?02/10/2022, 8:47 AM ? ?  ? ?

## 2022-02-11 NOTE — Telephone Encounter (Signed)
Will mail letter asking if patient would like referral to [facility] diabetes prevention program that is covered at 100% by her insurance. ?

## 2022-02-13 ENCOUNTER — Encounter: Payer: Medicare Other | Admitting: Dietician

## 2022-02-13 ENCOUNTER — Telehealth: Payer: Self-pay | Admitting: *Deleted

## 2022-02-13 NOTE — Telephone Encounter (Addendum)
Transfer all prescriptions to the [facility] on [address] [facility] [address] [employer] [facility] [employer]. ? ?Call to [place] stated they are willing to call [facility] to get the refills and medication list for the patient.  Will send over request for refills if needed. ?

## 2022-02-14 ENCOUNTER — Other Ambulatory Visit: Payer: Self-pay | Admitting: Student

## 2022-02-17 ENCOUNTER — Telehealth: Payer: Self-pay | Admitting: Behavioral Health

## 2022-02-17 ENCOUNTER — Institutional Professional Consult (permissible substitution): Payer: Medicare Other | Admitting: Behavioral Health

## 2022-02-17 NOTE — Telephone Encounter (Signed)
Pt agreed to f:f appt r/s'g to 03/17/22 '@1'$ :00pm for 60 min. ? ?Dr. Theodis Shove ?

## 2022-02-24 ENCOUNTER — Other Ambulatory Visit: Payer: Self-pay | Admitting: Internal Medicine

## 2022-02-24 DIAGNOSIS — G4733 Obstructive sleep apnea (adult) (pediatric): Secondary | ICD-10-CM

## 2022-02-26 ENCOUNTER — Other Ambulatory Visit: Payer: Self-pay

## 2022-02-26 DIAGNOSIS — K219 Gastro-esophageal reflux disease without esophagitis: Secondary | ICD-10-CM

## 2022-02-27 MED ORDER — METFORMIN HCL 500 MG PO TABS: 500.0000 mg | ORAL_TABLET | Freq: Every day | ORAL | 3 refills | Status: DC

## 2022-02-27 MED ORDER — OMEPRAZOLE 40 MG PO CPDR: 40.0000 mg | DELAYED_RELEASE_CAPSULE | Freq: Two times a day (BID) | ORAL | 2 refills | Status: DC

## 2022-03-11 ENCOUNTER — Ambulatory Visit (HOSPITAL_BASED_OUTPATIENT_CLINIC_OR_DEPARTMENT_OTHER): Payer: Medicare Other | Admitting: Internal Medicine

## 2022-03-11 VITALS — Ht 67.0 in | Wt 237.0 lb

## 2022-03-17 ENCOUNTER — Ambulatory Visit: Payer: Medicare Other | Admitting: Behavioral Health

## 2022-03-17 DIAGNOSIS — F4321 Adjustment disorder with depressed mood: Secondary | ICD-10-CM

## 2022-03-17 DIAGNOSIS — F419 Anxiety disorder, unspecified: Secondary | ICD-10-CM

## 2022-03-17 DIAGNOSIS — F331 Major depressive disorder, recurrent, moderate: Secondary | ICD-10-CM

## 2022-03-17 NOTE — BH Specialist Note (Signed)
Integrated Behavioral Health Initial In-Person Visit ? ?MRN: [id number] ?Name: Emma Bonilla ? ?Number of [place] Clinician visits: 1 ?Session Start time: 1300 ?Session End time: 8676 ?Total time in minutes: 45 min ? ?Types of Service: Individual psychotherapy ? ?Interpretor:No. Interpretor Name and Language: n/a ? ? ? Warm Hand Off Completed. ? ?  ? ?  ? ? ?Subjective: ?Emma Bonilla is a 66 y.o. female accompanied by  self ?Patient was referred by Dr. Lenise Herald, MD for grief support & mental health wellness. ?Patient reports the following symptoms/concerns: Pt expresses deep grief fllwg the death of multiple Family members since the death of her Father in 47. Since that time she has lost at least 8 more ppl. ?Duration of problem: since 2018/03/29 when her Str Emma Bonilla died of rectal cancer; Severity of problem: moderate; Pt is tearful & describes the loss of Family on many levels. Pt has other accumulated losses. ? ?Objective: ?Mood: Anxious and Depressed and Affect: Appropriate ?Risk of harm to self or others: No plan to harm self or others ? ?Life Context: ?Family and Social: Pt lives w/her Husb.; the Families have been close since they were married. Pt has a Solicitor of friends she can count on for support. ?School/Work: Pt does not attend Sch & is not currently employed ?Self-Care: Pt is trying to address her grief. She realizes she needs assistance to do this w/a Professionl. ?Life Changes: Pt has been stressed since the beginning of COVID-19. She cannot do her life w/o psychotherapy & the aid of another person who can assist her to healing. ? ?Patient and/or Family's Strengths/Protective Factors: ?Social and Emotional competence, Concrete supports in place (healthy food, safe environments, etc.), Sense of purpose, and Physical Health (exercise, healthy diet, medication compliance, etc.) ? ?Goals Addressed: ?Patient will: ?Reduce symptoms of: anxiety, depression, and  stress ?Increase knowledge and/or ability of: coping skills, healthy habits, and stress reduction  ?Demonstrate ability to: Increase healthy adjustment to current life circumstances and Begin healthy grieving over loss ? ?Progress towards Goals: ?Estb'd today; Pt will attend psychotherapy sessions to further her healing. ? ?Interventions: ?Interventions utilized: Solution-Focused Strategies, Supportive Counseling, and initial grief validation/normalization of feelings   ?Standardized Assessments completed:  screeners prn ? ?Patient and/or Family Response: Pt is receptive to F2F visit today & has scheduled another subsequent session.  ? ?Patient Centered Plan: ?Patient is on the following Treatment Plan(s):  Pt will fllw suggestions & collaborate w/Provider to promote healing. ? ?Assessment: ?Patient currently experiencing elevation in her grief rxn as time goes by since the death of her close Str in 03/29/2018. Pt is reminded this mos of how hard her death impacted Pt. ?  ?Patient may benefit from cont'd sessions to address grief & constellation changes in the Family dynamic. ? ?Plan: ?Follow up with behavioral health clinician on : First avail 60 min F2F  ?Behavioral recommendations: Keep notes in the book I gave you about your feelings, rxns, & thoughts. Detail & record the deaths you have exp'd. ?Referral(s): [place] (In Clinic) ?"From scale of 1-10, how likely are you to follow plan?": 8 ? ?Donnetta Hutching, LMFT ? ? ? ? ? ? ? ? ?

## 2022-03-21 ENCOUNTER — Ambulatory Visit (HOSPITAL_BASED_OUTPATIENT_CLINIC_OR_DEPARTMENT_OTHER): Payer: Medicare Other | Attending: Internal Medicine | Admitting: Internal Medicine

## 2022-03-21 DIAGNOSIS — E6609 Other obesity due to excess calories: Secondary | ICD-10-CM | POA: Diagnosis present

## 2022-03-21 DIAGNOSIS — Z6837 Body mass index (BMI) 37.0-37.9, adult: Secondary | ICD-10-CM | POA: Diagnosis present

## 2022-03-21 DIAGNOSIS — G4733 Obstructive sleep apnea (adult) (pediatric): Secondary | ICD-10-CM | POA: Insufficient documentation

## 2022-03-23 ENCOUNTER — Encounter (HOSPITAL_BASED_OUTPATIENT_CLINIC_OR_DEPARTMENT_OTHER): Payer: Medicare Other | Admitting: Internal Medicine

## 2022-03-29 DIAGNOSIS — G4733 Obstructive sleep apnea (adult) (pediatric): Secondary | ICD-10-CM | POA: Diagnosis not present

## 2022-03-29 DIAGNOSIS — E6609 Other obesity due to excess calories: Secondary | ICD-10-CM | POA: Diagnosis not present

## 2022-03-29 DIAGNOSIS — Z6837 Body mass index (BMI) 37.0-37.9, adult: Secondary | ICD-10-CM

## 2022-03-29 NOTE — Procedures (Signed)
? ? ?  Patient Name: Emma Bonilla, Emma Bonilla ?Study Date: 03/24/2022 ?Gender: Female ?D.O.B: [birth year] ?Age (years): 21 ?Referring Provider: Welford Roche MD ?Height (inches): 67 ?Interpreting Physician: Baird Lyons MD, ABSM ?Weight (lbs): 237 ?RPSGT: Jacolyn Reedy ?BMI: 37 ?MRN: [id number] ?Neck Size: 18.00 ? ?CLINICAL INFORMATION ?Sleep Study Type: HST ?Indication for sleep study: Fatigue, Obesity, OSA, Weight Gain, Witnesses Apnea / Gasping During Sleep ?Epworth Sleepiness Score: N/A ? ?Most recent polysomnogram dated 07/19/2019 revealed an AHI of 18.3/h and RDI of 22.0/h. Most recent titration study dated 09/16/2019 was optimal at 10cm H2O with an AHI of 4.7/h. ? ?SLEEP STUDY TECHNIQUE ?A multi-channel overnight portable sleep study was performed. The channels recorded were: nasal airflow, thoracic respiratory movement, and oxygen saturation with a pulse oximetry. Snoring was also monitored. ? ?MEDICATIONS ?Patient self administered medications include: AMITRIPTYLINE, METOPROLOL, BUPROPION, MELATONIN. ? ?SLEEP ARCHITECTURE ?Patient was studied for 373 minutes. The sleep efficiency was 100.0 % and the patient was supine for 76.8%. The arousal index was 0.0 per hour. ? ?RESPIRATORY PARAMETERS ?The overall AHI was 21.9 per hour, with a central apnea index of 0 per hour. ?The oxygen nadir was 92% during sleep. ? ?CARDIAC DATA ?Mean heart rate during sleep was 77.5 bpm. ? ?IMPRESSIONS ?- Moderate obstructive sleep apnea occurred during this study (AHI = 21.9/h). ?- The patient had minimal or no oxygen desaturation during the study (Min O2 = 92%) ?- Patient snored. ? ?DIAGNOSIS ?- Obstructive Sleep Apnea (G47.33) ? ?RECOMMENDATIONS ?- Suggest CPAP titration sleep study or autopap. Other options would be based on clinical judgment. ?- Be careful with alcohol, sedatives and other CNS depressants that may worsen sleep apnea and disrupt normal sleep architecture. ?- Sleep hygiene should be reviewed to assess factors  that may improve sleep quality. ?- Weight management and regular exercise should be initiated or continued. ? ?[Electronically signed] 03/29/2022 11:25 AM ? ?Baird Lyons MD, ABSM ?Diplomate, Tax adviser of Sleep Medicine ?NPI: [id number] ? ?  ? ? ? ? ? ? ? ? ? ? ? ? ? ? ? ? ? ? ? ? ? ?Emma Bonilla ?Diplomate, Tax adviser of Sleep Medicine ? ?ELECTRONICALLY SIGNED ON:  03/29/2022, 11:23 AM ?[place] ?PH: [phone]   FX: [phone] ?ACCREDITED BY THE AMERICAN ACADEMY OF SLEEP MEDICINE ?

## 2022-04-01 ENCOUNTER — Encounter: Payer: Self-pay | Admitting: Gastroenterology

## 2022-04-01 ENCOUNTER — Ambulatory Visit (INDEPENDENT_AMBULATORY_CARE_PROVIDER_SITE_OTHER): Payer: Medicare Other | Admitting: Gastroenterology

## 2022-04-01 VITALS — BP 142/78 | HR 82 | Ht 67.0 in | Wt 236.4 lb

## 2022-04-01 DIAGNOSIS — Z7902 Long term (current) use of antithrombotics/antiplatelets: Secondary | ICD-10-CM

## 2022-04-01 DIAGNOSIS — D508 Other iron deficiency anemias: Secondary | ICD-10-CM

## 2022-04-01 DIAGNOSIS — K219 Gastro-esophageal reflux disease without esophagitis: Secondary | ICD-10-CM

## 2022-04-01 NOTE — Progress Notes (Signed)
Agree with assessment and plan as outlined.  

## 2022-04-01 NOTE — Patient Instructions (Signed)
You have been scheduled for an endoscopy. Please follow written instructions given to you at your visit today. ?If you use inhalers (even only as needed), please bring them with you on the day of your procedure.  ? ?You will be contacted by our office prior to your procedure for directions on holding your Plavix.  If you do not hear from our office 1 week prior to your scheduled procedure, please call [phone] to discuss.   ? ?Due to recent changes in healthcare laws, you may see the results of your imaging and laboratory studies on MyChart before your provider has had a chance to review them.  We understand that in some cases there may be results that are confusing or concerning to you. Not all laboratory results come back in the same time frame and the provider may be waiting for multiple results in order to interpret others.  Please give [place] 48 hours in order for your provider to thoroughly review all the results before contacting the office for clarification of your results.   ? ?If you are age 66 or older, your body mass index should be between 23-30. Your Body mass index is 37.03 kg/m?Marland Kitchen If this is out of the aforementioned range listed, please consider follow up with your Primary Care Provider. ? ?If you are age 78 or younger, your body mass index should be between 19-25. Your Body mass index is 37.03 kg/m?Marland Kitchen If this is out of the aformentioned range listed, please consider follow up with your Primary Care Provider.  ? ?________________________________________________________ ? ?The [facility] providers would like to encourage you to use [facility] to communicate with providers for non-urgent requests or questions.  Due to long hold times on the telephone, sending your provider a message by [facility] may be a faster and more efficient way to get a response.  Please allow 48 business hours for a response.  Please remember that this is for non-urgent requests.   ?_______________________________________________________  ? ?I appreciate the  opportunity to care for you ? ?Thank You  ? ?Jessica Zehr,PA-C  ?

## 2022-04-01 NOTE — Progress Notes (Signed)
? ? ? ?04/01/2022 ?[name] ?[id number] ?24-Oct-1956 ? ? ?HISTORY OF PRESENT ILLNESS:  This is a 66 year old female who is a patient of Dr. Doyne Keel.  She was seen by me almost a year ago for complaints of GERD, iron deficiency with a normal hemoglobin, and history of colon polyps.  She was scheduled for an EGD and colonoscopy.  She had a colonoscopy in August 2022 with some polyps removed and was put in for a 5-year colonoscopy recall.  The EGD was canceled at that time because she had some loose teeth.  She has addressed that issue now, has gotten all teeth extracted and is working on getting dentures.  She would like to go ahead and reschedule the EGD.  She continues to have GERD.  She is on omeprazole 40 mg twice daily.  That helps for the most part, but she needs to remain upright especially after eating, etc.  In regards to iron deficiency she was on ferrous sulfate once a day and when I saw her almost a year ago I increased it to twice a day.  Her hemoglobin still remains normal and her iron studies have improved to low normal ranges. ? ?She is on Plavix for history of coronary artery disease with stents in the remote past.  Her cardiologist is Dr. Debara Pickett. ? ? ?Past Medical History:  ?Diagnosis Date  ? Anemia   ? Arthritis   ? Depression   ? Diastolic dysfunction without heart failure   ? Family history of rectal cancer   ? GERD (gastroesophageal reflux disease)   ? Hepatic steatosis   ? a. seen on prior US.  ? Hyperlipidemia   ? Hypertension   ? Mild aortic regurgitation   ? Mild aortic stenosis   ? Premature atrial contractions   ? PVC's (premature ventricular contractions)   ? Thyroid disease   ? TIA (transient ischemic attack)   ? 2010  ? Tobacco abuse   ? ?Past Surgical History:  ?Procedure Laterality Date  ? CORONARY STENT INTERVENTION N/A 10/13/2017  ? Procedure: CORONARY STENT INTERVENTION;  Surgeon: Leonie Man, MD;  Location: [facility];  Service: Cardiovascular;  Laterality: N/A;   ? DILATION AND CURETTAGE OF UTERUS    ? 30 years ago  ? FOOT SURGERY    ? LEFT HEART CATH AND CORONARY ANGIOGRAPHY N/A 10/13/2017  ? Procedure: LEFT HEART CATH AND CORONARY ANGIOGRAPHY;  Surgeon: Leonie Man, MD;  Location: [facility];  Service: Cardiovascular;  Laterality: N/A;  ? ? reports that she has been smoking cigarettes. She has a 22.50 pack-year smoking history. She has never used smokeless tobacco. She reports current alcohol use. She reports current drug use. Frequency: 1.00 time per week. Drug: Marijuana. ?family history includes Cancer in her mother and sister; Colon cancer in her sister; Congestive Heart Failure in her sister; Crohn's disease in her father and sister; Diabetes in her brother; Emphysema in her mother; Heart disease in her father, sister, and sister; Heart failure in her mother; Hyperlipidemia in her child, child, sister, and sister; Hypertension in her child, father, mother, sister, sister, and sister; Liver cancer in her brother; Skin cancer in her maternal grandmother; Stroke in her maternal grandfather. ?Allergies  ?Allergen Reactions  ? Naproxen Sodium Rash  ? ? ?  ?Outpatient Encounter Medications as of 04/01/2022  ?Medication Sig  ? amitriptyline (ELAVIL) 25 MG tablet Take 1 tablet (25 mg total) by mouth at bedtime. After a couple  of weeks, stop the amitriptyline altogether.  ? atorvastatin (LIPITOR) 40 MG tablet TAKE 1 TABLET BY MOUTH ONCE DAILY AT 6 PM  ? buPROPion (WELLBUTRIN SR) 150 MG 12 hr tablet TAKE 1 TABLET ('150MG'$  TOTAL) BY MOUTH 2 TIMES A DAY  ? calcium-vitamin D (OSCAL WITH D) 500-200 MG-UNIT per tablet Take 1 tablet by mouth daily.  ? clopidogrel (PLAVIX) 75 MG tablet Take 1 tablet (75 mg total) by mouth daily with breakfast.  ? EQ ALLERGY RELIEF, CETIRIZINE, 10 MG tablet Take 1 tablet by mouth once daily  ? ezetimibe (ZETIA) 10 MG tablet Take 1 tablet (10 mg total) by mouth daily.  ? ferrous sulfate 325 (65 FE) MG tablet Take 1 tablet (325 mg total) by  mouth daily.  ? levothyroxine (SYNTHROID) 100 MCG tablet TAKE 1 TABLET (100MCG TOTAL) BY MOUTH DAILY.  ? losartan-hydrochlorothiazide (HYZAAR) 100-25 MG tablet Take 1 tablet by mouth daily.  ? Melatonin 3 MG TABS Take 3 mg by mouth at bedtime.  ? metFORMIN (GLUCOPHAGE) 500 MG tablet Take 1 tablet (500 mg total) by mouth daily.  ? metoprolol succinate (TOPROL-XL) 25 MG 24 hr tablet Take 0.5 tablets (12.5 mg total) by mouth daily.  ? omeprazole (PRILOSEC) 40 MG capsule Take 1 capsule (40 mg total) by mouth 2 (two) times daily.  ? PAZEO 0.7 % SOLN PLACE 1 DROP TO EYE DAILY.  ? ?No facility-administered encounter medications on file as of 04/01/2022.  ? ? ? ?REVIEW OF SYSTEMS  : All other systems reviewed and negative except where noted in the History of Present Illness. ? ? ?PHYSICAL EXAM: ?BP (!) 142/78   Pulse 82   Ht '5\' 7"'$  (1.702 m)   Wt 236 lb 6.4 oz (107.2 kg)   LMP 02/11/2011 Comment: no chance of pregnancy per pt  SpO2 98%   BMI 37.03 kg/m?  ?General: Well developed female in no acute distress ?Head: Normocephalic and atraumatic ?Eyes:  Sclerae anicteric, conjunctiva pink. ?Ears: Normal auditory acuity ?Lungs: Clear throughout to auscultation; no W/R/R. ?Heart: Regular rate and rhythm; no M/R/G. ?Abdomen: Soft, non-distended.  BS present.  Mild epigastric TTP. ?Musculoskeletal: Symmetrical with no gross deformities  ?Skin: No lesions on visible extremities ?Extremities: No edema  ?Neurological: Alert oriented x 4, grossly non-focal ?Psychological:  Alert and cooperative. Normal mood and affect ? ?ASSESSMENT AND PLAN: ?*GERD: Ongoing issues for years.  Does take omeprazole 40 mg twice daily and still gets occasional symptoms even with that.  Had an EGD several years ago.  Was supposed to have one back in August 2022, but she had some issues with loose teeth at that time so it was canceled.  She has had that issue addressed, all of her teeth have been extracted and she is working on getting dentures.  We will  plan for EGD with Dr. Havery Moros. ?*Iron deficiency: Hemoglobin is normal.  I increased her ferrous sulfate to twice daily when I saw her almost a year ago.  Her iron levels have improved since that time and are now actually within normal range. ?*Antiplatelet use with Plavix for history of CAD:  Hold Plavix for 5 days before procedure - will instruct when and how to resume after procedure. Risks and benefits of procedure including bleeding, perforation, infection, missed lesions, medication reactions and possible hospitalization or surgery if complications occur explained. Additional rare but real risk of cardiovascular event such as heart attack or ischemia/infarct of other organs off of Plavix explained and need to seek urgent help if  this occurs. Will communicate by phone or EMR with patient's prescribing provider, Dr. Debara Pickett, to confirm that holding Plavix is reasonable in this case.   ? ? ?CC:  Angelica Pou, MD ? ?  ?

## 2022-04-03 ENCOUNTER — Other Ambulatory Visit: Payer: Self-pay | Admitting: Internal Medicine

## 2022-04-03 DIAGNOSIS — G4733 Obstructive sleep apnea (adult) (pediatric): Secondary | ICD-10-CM

## 2022-04-08 ENCOUNTER — Ambulatory Visit: Payer: Medicare Other | Admitting: Behavioral Health

## 2022-04-08 DIAGNOSIS — F419 Anxiety disorder, unspecified: Secondary | ICD-10-CM

## 2022-04-08 NOTE — BH Specialist Note (Signed)
Integrated Behavioral Health Follow Up In-Person Visit ? ?MRN: [id number] ?Name: Emma Bonilla ? ?Number of [place] Clinician visits: 2 ?Session Start time: 1100 ?Session End time: 1200 ?Total time in minutes: 60 min ? ?Types of Service: Individual psychotherapy ? ?Interpretor:No. Interpretor Name and Language: n/a ? ? ?Subjective: ?JERMAINE THOLL is a 66 y.o. female accompanied by  self ?Patient was referred by Dr. Lenise Herald, MD for anx/dep & mental health well-being. Pt has Hx of multiple losses & tragic Family situations. ?Patient reports the following symptoms/concerns: She has isolated from people in the past when her anx levels are elevated. She is becoming aware of when she is triggered & has coping skills to address these times.  ?Duration of problem: yrs; Severity of problem: moderate ? ?Objective: ?Mood: Anxious and Affect: Appropriate ?Risk of harm to self or others: No plan to harm self or others ? ?Life Context: ?Family and Social: Pt is married to [employer] He has recently reported she stops breathing in her sleep. This has resulted in her concerns for f/u & recent Sleep Study in the home. Pt has minimal social interactions outside her immediate Family network. She keeps her 66yo [place] enjoys caring for her. 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 ?Self-Care: Pt is inc'g her attempts to care for self & her health. Pt uses number & color games on her phone for distraction from her anxiety. She will leave an uncomfortable situation if this does not work. ?Life Changes: Pt is in procedures to get her dentures soon. All teeth were removed due to bone degeneration in her jaw per Pt report. She will be glad to have her dentures over the Summer. ? ?Patient and/or Family's Strengths/Protective Factors: ?Social and Emotional competence, Concrete supports in place (healthy food, safe environments, etc.), Sense of purpose, Physical  Health (exercise, healthy diet, medication compliance, etc.), and Caregiver has knowledge of parenting & child development ? ?Goals Addressed: ?Patient will: ? Reduce symptoms of: anxiety manifesting in intense anticipatory worry @ times ? Increase knowledge and/or ability of: coping skills, self-management skills, and stress reduction  ? Demonstrate ability to: Increase healthy adjustment to current life circumstances and Begin healthy grieving over losses that have occurred since the death of her Father in 83. Pt is Family-centered & has many relatives she tries to remain in contact with for her mental health well-being. ? ?Progress towards Goals: ?Ongoing ? ?Interventions: ?Interventions utilized:  Solution-Focused Strategies and Supportive Counseling ?Standardized Assessments completed:  screeners prn ? ?Patient and/or Family Response: Pt is receptive to visit today & has scheduled a future appt. ? ?Patient Centered Plan: ?Patient is on the following Treatment Plan(s): Discussed w/Pt addt'l ways to address her anxiety. Provided tools & resources she can access on her phone. Try [email or web address] ?Assessment: ?Patient currently experiencing somewhat of a reduction in her anx levels & she is more aware of triggering events & how to handle them.  ? ?Patient may benefit from cont'd Cslg. ? ?Plan: ?Follow up with behavioral health clinician on : 2-3 wks for 60 min F2F ?Behavioral recommendations: Try calm.com, do deep breathing when you feel you are holding your breath. ?Referral(s): [place] (In Clinic) ?"From scale of 1-10, how likely are you to follow plan?": 7 ? ?Donnetta Hutching, LMFT ? ? ?

## 2022-04-10 ENCOUNTER — Telehealth: Payer: Self-pay | Admitting: *Deleted

## 2022-04-10 NOTE — Telephone Encounter (Signed)
Tele pre op appt 04/11/22 @ 3 pm. GI office already went over the instructions in regard to holding he Plavix, which is followed by pt's cardiologist Dr. Debara Pickett. I informed the pt that she needed a telephone appt with the pre op provider, which at the time the provider will go over the recommendations in regard to holding her Plavix. Pt has been scheduled for tele pre op appt 04/11/22 @ 3 pm. Pt thanked me for the call and the help. I will send FYI to requesting office the pt needs an appt for pre op clearance and has been set for 04/11/22.  ?  ?Med rec and consent are done.  ?

## 2022-04-10 NOTE — Telephone Encounter (Signed)
Ok to hold Plavix 5 days prior to procedure and resume when safe from a bleeding standpoint afterward. ? ?Dr Lemmie Evens ?

## 2022-04-10 NOTE — Telephone Encounter (Signed)
Emma Bonilla 66 year old female is requesting preoperative cardiac evaluation for endoscopy procedure.  She was last seen in the clinic on 05/17/2021.  She was doing well from a cardiac standpoint at that time.  Follow-up was planned for 12 months. ? ?Her PMH includes essential hypertension, CAD status postcoronary angioplasty 11/18 she received overlapping stents to her RCA, aortic valve stenosis, TIA, OSA, hypothyroidism, tobacco abuse, iron deficiency anemia, depression, obesity, prediabetes, and HLD.  Her echocardiogram 7/20 showed EF greater than 65%, tricuspid aortic valve, mild aortic insufficiency, and mild aortic stenosis, mild dilation of ascending aorta measuring 39 mm. ? ? ?May her Plavix be held prior to her procedure? ? ?Thank you for your help.  Please direct your response to CV DIV preop pool. ? ?Jossie Ng. Alexea Blase NP-C ? ?  ?04/10/2022, 3:51 PM ?[place] ?[address] ?Office [phone] Fax [phone] ? ?

## 2022-04-10 NOTE — Telephone Encounter (Signed)
[facility] Pre-operative Risk Assessment  ?   ?Request for surgical clearance:     Endoscopy Procedure ? ?What type of surgery is being performed?     Endoscopy  ? ?When is this surgery scheduled?     05/05/2022 ? ?What type of clearance is required ?   Pharmacy ? ?Are there any medications that need to be held prior to surgery and how long? Plavix 5 days  Dr Havery Moros  ? ?Practice name and name of physician performing surgery?      [facility] ? ?What is your office phone and fax number?      Phone- [phone]  Fax- [phone] ? ?Anesthesia type (None, local, MAC, general) ?       MAC  ?

## 2022-04-10 NOTE — Telephone Encounter (Signed)
Ok to hold 5 days  Pt aware to hold 5 days  ?

## 2022-04-10 NOTE — Telephone Encounter (Signed)
Tele pre op appt 04/11/22 @ 3 pm. GI office already went over the instructions in regard to holding he Plavix, which is followed by pt's cardiologist Dr. Debara Pickett. I informed the pt that she needed a telephone appt with the pre op provider, which at the time the provider will go over the recommendations in regard to holding her Plavix. Pt has been scheduled for tele pre op appt 04/11/22 @ 3 pm. Pt thanked me for the call and the help. I will send FYI to requesting office the pt needs an appt for pre op clearance and has been set for 04/11/22.  ? ?Med rec and consent are done.  ?  ?Patient Consent for Virtual Visit  ? ? ?   ? ?Emma Bonilla has provided verbal consent on 04/10/2022 for a virtual visit (video or telephone). ? ? ?CONSENT FOR VIRTUAL VISIT FOR:  Emma Bonilla  ?By participating in this virtual visit I agree to the following: ? ?I hereby voluntarily request, consent and authorize [facility] and its employed or contracted physicians, physician assistants, nurse practitioners or other licensed health care professionals (the Practitioner), to provide me with telemedicine health care services (the ?Services") as deemed necessary by the treating Practitioner. I acknowledge and consent to receive the Services by the Practitioner via telemedicine. I understand that the telemedicine visit will involve communicating with the Practitioner through live audiovisual communication technology and the disclosure of certain medical information by electronic transmission. I acknowledge that I have been given the opportunity to request an in-person assessment or other available alternative prior to the telemedicine visit and am voluntarily participating in the telemedicine visit. ? ?I understand that I have the right to withhold or withdraw my consent to the use of telemedicine in the course of my care at any time, without affecting my right to future care or treatment, and that the Practitioner or I may terminate the  telemedicine visit at any time. I understand that I have the right to inspect all information obtained and/or recorded in the course of the telemedicine visit and may receive copies of available information for a reasonable fee.  I understand that some of the potential risks of receiving the Services via telemedicine include:  ?Delay or interruption in medical evaluation due to technological equipment failure or disruption; ?Information transmitted may not be sufficient (e.g. poor resolution of images) to allow for appropriate medical decision making by the Practitioner; and/or  ?In rare instances, security protocols could fail, causing a breach of personal health information. ? ?Furthermore, I acknowledge that it is my responsibility to provide information about my medical history, conditions and care that is complete and accurate to the best of my ability. I acknowledge that Practitioner's advice, recommendations, and/or decision may be based on factors not within their control, such as incomplete or inaccurate data provided by me or distortions of diagnostic images or specimens that may result from electronic transmissions. I understand that the practice of medicine is not an exact science and that Practitioner makes no warranties or guarantees regarding treatment outcomes. I acknowledge that a copy of this consent can be made available to me via my patient portal ([facility]), or I can request a printed copy by calling the office of [facility].   ? ?I understand that my insurance will be billed for this visit.  ? ?I have read or had this consent read to me. ?I understand the contents of this consent, which adequately explains the benefits and  risks of the Services being provided via telemedicine.  ?I have been provided ample opportunity to ask questions regarding this consent and the Services and have had my questions answered to my satisfaction. ?I give my informed consent for the services to be  provided through the use of telemedicine in my medical care ? ? ? ?

## 2022-04-10 NOTE — Telephone Encounter (Signed)
Preoperative team, please contact this patient and set up a phone call appointment for further cardiac evaluation.  Thank you for your help. ? ?Emma Bonilla. Corinthia Helmers NP-C ? ?  ?04/10/2022, 4:49 PM ?[place] ?[address] ?Office [phone] Fax [phone] ? ?

## 2022-04-11 ENCOUNTER — Ambulatory Visit (INDEPENDENT_AMBULATORY_CARE_PROVIDER_SITE_OTHER): Payer: Medicare Other | Admitting: General Practice

## 2022-04-11 DIAGNOSIS — Z01810 Encounter for preprocedural cardiovascular examination: Secondary | ICD-10-CM | POA: Diagnosis not present

## 2022-04-11 NOTE — Progress Notes (Signed)
? ?Virtual Visit via Telephone Note  ? ?This visit type was conducted due to national recommendations for restrictions regarding the COVID-19 Pandemic (e.g. social distancing) in an effort to limit this patient's exposure and mitigate transmission in our community.  Due to her co-morbid illnesses, this patient is at least at moderate risk for complications without adequate follow up.  This format is felt to be most appropriate for this patient at this time.  The patient did not have access to video technology/had technical difficulties with video requiring transitioning to audio format only (telephone).  All issues noted in this document were discussed and addressed.  No physical exam could be performed with this format.  Please refer to the patient's chart for her  consent to telehealth for [facility]. ? ?Evaluation Performed:  Preoperative cardiovascular risk assessment ?_____________  ? ?Date:  04/11/2022  ? ?Patient ID:  Emma Bonilla, Emma Bonilla [birth year], MRN [id number] ?Patient Location:  ?Home ?Provider location:   ?Office ? ?Primary Care Provider:  Angelica Pou, MD ?Primary Cardiologist:  Pixie Casino, MD ? ?Chief Complaint  ?  ?66 y.o. y/o female with a h/o coronary artery disease, hypertension, hyperlipidemia, GERD, who is pending endoscopy, and presents today for telephonic preoperative cardiovascular risk assessment. ? ?Past Medical History  ?  ?Past Medical History:  ?Diagnosis Date  ? Anemia   ? Arthritis   ? Depression   ? Diastolic dysfunction without heart failure   ? Family history of rectal cancer   ? GERD (gastroesophageal reflux disease)   ? Hepatic steatosis   ? a. seen on prior US.  ? Hyperlipidemia   ? Hypertension   ? Mild aortic regurgitation   ? Mild aortic stenosis   ? Premature atrial contractions   ? PVC's (premature ventricular contractions)   ? Thyroid disease   ? TIA (transient ischemic attack)   ? 2010  ? Tobacco abuse   ? ?Past Surgical History:  ?Procedure Laterality  Date  ? CORONARY STENT INTERVENTION N/A 10/13/2017  ? Procedure: CORONARY STENT INTERVENTION;  Surgeon: Leonie Man, MD;  Location: [facility];  Service: Cardiovascular;  Laterality: N/A;  ? DILATION AND CURETTAGE OF UTERUS    ? 30 years ago  ? FOOT SURGERY    ? LEFT HEART CATH AND CORONARY ANGIOGRAPHY N/A 10/13/2017  ? Procedure: LEFT HEART CATH AND CORONARY ANGIOGRAPHY;  Surgeon: Leonie Man, MD;  Location: [facility];  Service: Cardiovascular;  Laterality: N/A;  ? ? ?Allergies ? ?Allergies  ?Allergen Reactions  ? Naproxen Sodium Rash  ? ? ?History of Present Illness  ?  ?Emma Bonilla is a 66 y.o. female who presents via audio/video conferencing for a telehealth visit today.  Pt was last seen in cardiology clinic on 05/17/2021 by Coletta Memos, NP-C.  At that time Emma Bonilla was doing well .  The patient is now pending procedure as outlined above. Since her last visit, she remained stable from a cardiac standpoint. ? ?Today she denies chest pain, shortness of breath, lower extremity edema, fatigue, palpitations, hematuria, hemoptysis, diaphoresis, weakness, presyncope, syncope, orthopnea, and PND. ? ? ? ?Home Medications  ?  ?Prior to Admission medications   ?Medication Sig Start Date End Date Taking? Authorizing Provider  ?amitriptyline (ELAVIL) 25 MG tablet Take 1 tablet (25 mg total) by mouth at bedtime. After a couple of weeks, stop the amitriptyline altogether. ?Patient not taking: Reported on 04/10/2022 01/16/22   Angelica Pou, MD  ?  atorvastatin (LIPITOR) 40 MG tablet TAKE 1 TABLET BY MOUTH ONCE DAILY AT 6 PM 01/07/22   Hilty, Nadean Corwin, MD  ?buPROPion [facility] SR) 150 MG 12 hr tablet TAKE 1 TABLET ('150MG'$  TOTAL) BY MOUTH 2 TIMES A DAY 01/21/22   Angelica Pou, MD  ?calcium-vitamin D (OSCAL WITH D) 500-200 MG-UNIT per tablet Take 1 tablet by mouth daily. 08/08/13   Jones Bales, MD  ?clopidogrel (PLAVIX) 75 MG tablet Take 1 tablet (75 mg total) by mouth daily  with breakfast. 06/14/21   Hilty, Nadean Corwin, MD  ?EQ ALLERGY RELIEF, CETIRIZINE, 10 MG tablet Take 1 tablet by mouth once daily 02/14/22   Angelica Pou, MD  ?ezetimibe (ZETIA) 10 MG tablet Take 1 tablet (10 mg total) by mouth daily. 02/03/22   Hilty, Nadean Corwin, MD  ?ferrous sulfate 325 (65 FE) MG tablet Take 1 tablet (325 mg total) by mouth daily. 03/31/18   Santos-Sanchez, Merlene Morse, MD  ?levothyroxine (SYNTHROID) 100 MCG tablet TAKE 1 TABLET (100MCG TOTAL) BY MOUTH DAILY. 06/12/21   Angelica Pou, MD  ?losartan-hydrochlorothiazide (HYZAAR) 100-25 MG tablet Take 1 tablet by mouth daily. 01/21/22   Angelica Pou, MD  ?Melatonin 3 MG TABS Take 3 mg by mouth at bedtime.    [provider]  ?metFORMIN (GLUCOPHAGE) 500 MG tablet Take 1 tablet (500 mg total) by mouth daily. 02/27/22 02/27/23  Angelica Pou, MD  ?metoprolol succinate (TOPROL-XL) 25 MG 24 hr tablet Take 0.5 tablets (12.5 mg total) by mouth daily. 06/12/21   Angelica Pou, MD  ?omeprazole (PRILOSEC) 40 MG capsule Take 1 capsule (40 mg total) by mouth 2 (two) times daily. 02/27/22 05/28/22  Angelica Pou, MD  ?PAZEO 0.7 % SOLN PLACE 1 DROP TO EYE DAILY. 08/15/19   Welford Roche, MD  ? ? ?Physical Exam  ?  ?Vital Signs:  [name] does not have vital signs available for review today. ? ?Given telephonic nature of communication, physical exam is limited. ?AAOx3. NAD. Normal affect.  Speech and respirations are unlabored. ? ?Accessory Clinical Findings  ?  ?None ? ?Assessment & Plan  ?  ?1.  Preoperative Cardiovascular Risk Assessment: ? ?  ? ?Primary Cardiologist: Pixie Casino, MD ? ?Chart reviewed as part of pre-operative protocol coverage. Given past medical history and time since last visit, based on ACC/AHA guidelines, Emma Bonilla would be at acceptable risk for the planned procedure without further cardiovascular testing.  ? ?Patient was advised that if she develops new symptoms prior to surgery to  contact our office to arrange a follow-up appointment.  Emma Bonilla verbalized understanding. ? ?His Plavix may be held for 5 days prior to his procedure.  Please resume as soon as hemostasis is achieved. ? ? ? ? ?A copy of this note will be routed to requesting surgeon. ? ?Time:   ?Today, I have spent 8 minutes with the patient with telehealth technology discussing medical history, symptoms, and management plan.  Prior to her phone evaluation I spent greater than 10 minutes reviewing her past medical history and medications. ? ? ?Deberah Pelton, NP ? ?04/11/2022, 12:10 PM ? ?

## 2022-04-11 NOTE — Progress Notes (Signed)
PT HAS BEEN CLEARED . CLEARANCE NOTES HAVE BEEN FAXED TO REQUESTING OFFICE [phone] ?

## 2022-04-17 ENCOUNTER — Other Ambulatory Visit: Payer: Self-pay

## 2022-04-17 ENCOUNTER — Ambulatory Visit (INDEPENDENT_AMBULATORY_CARE_PROVIDER_SITE_OTHER): Payer: Medicare Other | Admitting: Internal Medicine

## 2022-04-17 VITALS — BP 137/85 | HR 72 | Temp 98.3°F | Ht 67.0 in | Wt 233.3 lb

## 2022-04-17 DIAGNOSIS — F17210 Nicotine dependence, cigarettes, uncomplicated: Secondary | ICD-10-CM | POA: Diagnosis not present

## 2022-04-17 DIAGNOSIS — E039 Hypothyroidism, unspecified: Secondary | ICD-10-CM | POA: Diagnosis not present

## 2022-04-17 DIAGNOSIS — R202 Paresthesia of skin: Secondary | ICD-10-CM

## 2022-04-17 DIAGNOSIS — D509 Iron deficiency anemia, unspecified: Secondary | ICD-10-CM | POA: Diagnosis not present

## 2022-04-17 DIAGNOSIS — R200 Anesthesia of skin: Secondary | ICD-10-CM | POA: Diagnosis not present

## 2022-04-17 DIAGNOSIS — K219 Gastro-esophageal reflux disease without esophagitis: Secondary | ICD-10-CM

## 2022-04-17 DIAGNOSIS — I10 Essential (primary) hypertension: Secondary | ICD-10-CM

## 2022-04-17 DIAGNOSIS — E119 Type 2 diabetes mellitus without complications: Secondary | ICD-10-CM | POA: Diagnosis not present

## 2022-04-17 DIAGNOSIS — Z6835 Body mass index (BMI) 35.0-35.9, adult: Secondary | ICD-10-CM

## 2022-04-17 DIAGNOSIS — F32A Depression, unspecified: Secondary | ICD-10-CM

## 2022-04-17 DIAGNOSIS — K760 Fatty (change of) liver, not elsewhere classified: Secondary | ICD-10-CM | POA: Diagnosis not present

## 2022-04-17 DIAGNOSIS — F4321 Adjustment disorder with depressed mood: Secondary | ICD-10-CM

## 2022-04-17 DIAGNOSIS — E669 Obesity, unspecified: Secondary | ICD-10-CM

## 2022-04-17 MED ORDER — METOPROLOL SUCCINATE ER 25 MG PO TB24: 25.0000 mg | ORAL_TABLET | Freq: Every day | ORAL | 3 refills | Status: DC

## 2022-04-17 NOTE — Patient Instructions (Signed)
Emma Bonilla,  It was great to see you today.  We discussed your hand numbness, which is a nerve irritation or pressure somewhere between your neck and your hand, most likely your neck based on your examination.  If you should feel that your grip is weakening, please let me know.    We are increasing your metoprolol/Toprol to one full tablet of the 25 mg daily.  Please let me know if your top number is not below 135 (but goal is < 130).  See you in 3 months.  In the meantime, I'll follow up your referrals to the exercise program, the dietician, and look into your CPAP.  Take care and stay well,  Dr. Jimmye Norman

## 2022-04-17 NOTE — Progress Notes (Signed)
Last week had about 7 days of lower left sided back pain, something she'd never felt before, didn't worsen or improve with repositioning, no hematuria, bowels were normal (leafy vegs and certain fruits keep her regular).  Felt queasy and had poor appetite, then pain resolved spontaneously.  Pain reminded her of a remote gyn associated growth (cyst? Tumor?) and was given something to shrink it, didn't require surgery.  Similar in intensity and location. Hurt to walk.  Slept in sitting position.  No history of kidney stone.    Abdominal girth fluctuates with gas.  Numbness and tingling in hands - happening daily now, both sides.  Not there upon awakening in the morning, doesn't awaken at night.  No neck stiffness or pain. Carpal tunnel?  Sudden starts awake at night and not sleeping as much.  Sleepy during the day.  Husband says she snores. Did home study for CPAP, waiting for equipment.  Recent phone visit with cardiology for plavix recs in anticipation of colonoscopy.  Has been receiving grief counseling which has been beneficial.  Third visit upcoming. Even more deaths have occurred in the family which has been emotionally very challenging.  Stopped amitriptyline, mouth less dry.  Sleep med? trazadone Referred to [facility] for exercise, hasn't heard from them. REferred to [facility] for dietary counselling- hasn't heard back from her regarding elegibility with prediabetes.   Stomach doctor told her to take iron twice a day.  Getting sbps at home 140s, should bring cuff next time.     BP 137/85 (BP Location: Right Arm, Patient Position: Sitting, Cuff Size: Small)   Pulse 72   Temp 98.3 F (36.8 C) (Oral)   Ht '5\' 7"'$  (1.702 m)   Wt 233 lb 4.8 oz (105.8 kg)   LMP 02/11/2011 Comment: no chance of pregnancy per pt  SpO2 98%   BMI 36.54 kg/m   Hands tingle, exacerbated by spurlings test bilat.  FROM neck, no paracervical or trapezius spasm.  FROM shoulders.  Grips strong and symmetric bilaterally  (she has had no perceived weakness of hand grips).  Sensation to light touch is normal over dorsal and palmar fingers and hands.  Radial pulses are full and symmetric.  No color/temp/perfusion abnormalities of fingers.  She indicates that the numbness and tingling is over all 5 distal plantar surfaces.  Phalens and TInel's signs are positive though elicit sensations in all 5 fingers.  Back/flank/abdominal exams normal.  Patient examined both supine and sitting on exam table.  No tender areas on palpation or percussion of back, flank, orf LLQ.  No masses palpated on abdominal exam.  Nml bowel sounds.  Normal percussion notes.    Gastroesophageal reflux disease EGD in near future - Dr. Havery Moros.  Iron levels (suspected iron deficiency anemia from chronic GI loss) recently normal though pt reports her GI doctor instructed her to increase her iron tablet from once to twice a day.  She continues to take it once a day.    Type 2 diabetes mellitus, controlled ([place]) A1C 6.3 10/2021, recheck every 6+ months if no symptoms.  Referral to medical nutrition therapy for weight loss goals.  Iron deficiency anemia Fe has improved to normal range.  Remains on daily Fe supplement, tolerated well.  EGD is scheduled to evaluate for source.  Numbness and tingling in both hands Symptoms continue, though doesn't awaken her at night nor are they present upon morning awakening.  Spurling's as well as Tinels and Phalens tests reproduce symptoms of all distal fingers, not  just medial nerve distribution.  No motor impairment.  She doesn't wish to pursue NCS at this time.  Monitor.    Hypothyroidism TSH nml 01/2022 on current dose.  Monitor annually and prn symptoms.  Reaction, adjustment, with depressed mood, prolonged Grief counseling has been beneficial; 3rd visit upcoming.  Additional family members have died in the interim.  Very challenging.  SHe feels supported and is not experiencing complicated grief.   Obesity,  Class II, BMI 35-39.9 Resend referral for weight loss and nutritional management of chronic conditions using dx of T2DM (formerly listed incorrectly as prediabetes).   Essential hypertension SBPs more commonly in 140s at home.  Increase metoprolol from 12.5 to 25 mg daily.  Recommended that she bring home BP cuff to next visit for comparison.  Episode of L flank/back/LQ pain with spontaneous resolution; dx unclear.  A passed kidney stone is possible. Constipation unlikely given her symptoms report (no large BM or flatus associated with resolution of symptoms).  She will let us know if pain recurs.    Due for ophthalmology exam, TDaP, 2nd pneumococcal, zoster vaccines to be addressed at future visit.

## 2022-04-17 NOTE — Telephone Encounter (Signed)
Please send all of patients medications for a refill [facility] ([facility] ) - [address] [place], [place] Phone:  [phone]  Fax:  [phone]

## 2022-04-21 NOTE — Telephone Encounter (Signed)
atorvastatin (LIPITOR) 40 MG tablet, REFILL REQUEST @ [facility] ON [address].

## 2022-04-22 MED ORDER — CLOPIDOGREL BISULFATE 75 MG PO TABS: 75.0000 mg | ORAL_TABLET | Freq: Every day | ORAL | 3 refills | Status: DC

## 2022-04-22 MED ORDER — LOSARTAN POTASSIUM-HCTZ 100-25 MG PO TABS: 1.0000 | ORAL_TABLET | Freq: Every day | ORAL | 0 refills | Status: DC

## 2022-04-22 MED ORDER — ATORVASTATIN CALCIUM 40 MG PO TABS
ORAL_TABLET | ORAL | 0 refills | Status: DC
Start: 2022-04-22 — End: 2022-04-30

## 2022-04-22 MED ORDER — BUPROPION HCL ER (SR) 150 MG PO TB12: ORAL_TABLET | ORAL | 3 refills | Status: AC

## 2022-04-22 MED ORDER — LEVOTHYROXINE SODIUM 100 MCG PO TABS: ORAL_TABLET | ORAL | 3 refills | Status: DC

## 2022-04-22 MED ORDER — LOSARTAN POTASSIUM-HCTZ 100-25 MG PO TABS: 1.0000 | ORAL_TABLET | Freq: Every day | ORAL | 3 refills | Status: DC

## 2022-04-22 MED ORDER — METFORMIN HCL 500 MG PO TABS: 500.0000 mg | ORAL_TABLET | Freq: Every day | ORAL | 3 refills | Status: DC

## 2022-04-22 MED ORDER — EZETIMIBE 10 MG PO TABS: 10.0000 mg | ORAL_TABLET | Freq: Every day | ORAL | 1 refills | Status: DC

## 2022-04-22 NOTE — Telephone Encounter (Signed)
Pt called / informed of atorvastatin and hyzaar refills.

## 2022-04-22 NOTE — Telephone Encounter (Signed)
I called pt to let her know to call [facility] b/c there is one more refill on Atorvastatin. And she confirmd she switching from [facility].

## 2022-04-22 NOTE — Telephone Encounter (Signed)
Call from pt - stated [facility] does not have rx nor refill for Atorvastatin. Pt stated she will be out of Hyzaar also, needs a refill. She wants these 2 meds refilled at [facility] at least a 30 day supply  and other ones at [facility].

## 2022-04-23 ENCOUNTER — Encounter: Payer: Self-pay | Admitting: Gastroenterology

## 2022-04-29 ENCOUNTER — Ambulatory Visit: Payer: Medicare Other | Admitting: Behavioral Health

## 2022-04-30 ENCOUNTER — Other Ambulatory Visit: Payer: Self-pay

## 2022-04-30 DIAGNOSIS — E119 Type 2 diabetes mellitus without complications: Secondary | ICD-10-CM | POA: Insufficient documentation

## 2022-04-30 MED ORDER — ATORVASTATIN CALCIUM 40 MG PO TABS: ORAL_TABLET | ORAL | 0 refills | Status: DC

## 2022-04-30 NOTE — Assessment & Plan Note (Signed)
A1C 6.3 10/2021, recheck every 6+ months if no symptoms.  Referral to medical nutrition therapy for weight loss goals.

## 2022-04-30 NOTE — Assessment & Plan Note (Signed)
Fe has improved to normal range.  Remains on daily Fe supplement, tolerated well.  EGD is scheduled to evaluate for source.

## 2022-04-30 NOTE — Assessment & Plan Note (Signed)
EGD in near future - Dr. Havery Moros.  Iron levels (suspected iron deficiency anemia from chronic GI loss) recently normal though pt reports her GI doctor instructed her to increase her iron tablet from once to twice a day.  She continues to take it once a day.

## 2022-04-30 NOTE — Assessment & Plan Note (Signed)
Symptoms continue, though doesn't awaken her at night nor are they present upon morning awakening.  Spurling's as well as Tinels and Phalens tests reproduce symptoms of all distal fingers, not just medial nerve distribution.  No motor impairment.  She doesn't wish to pursue NCS at this time.  Monitor.

## 2022-04-30 NOTE — Assessment & Plan Note (Signed)
TSH nml 01/2022 on current dose.  Monitor annually and prn symptoms.

## 2022-04-30 NOTE — Assessment & Plan Note (Signed)
Grief counseling has been beneficial; 3rd visit upcoming.  Additional family members have died in the interim.  Very challenging.  SHe feels supported and is not experiencing complicated grief.

## 2022-04-30 NOTE — Assessment & Plan Note (Signed)
Resend referral for weight loss and nutritional management of chronic conditions using dx of T2DM (formerly listed incorrectly as prediabetes).

## 2022-04-30 NOTE — Assessment & Plan Note (Signed)
SBPs more commonly in 140s at home.  Increase metoprolol from 12.5 to 25 mg daily.  Recommended that she bring home BP cuff to next visit for comparison.

## 2022-05-05 ENCOUNTER — Encounter: Payer: Self-pay | Admitting: Gastroenterology

## 2022-05-05 ENCOUNTER — Other Ambulatory Visit: Payer: Self-pay

## 2022-05-05 ENCOUNTER — Ambulatory Visit (AMBULATORY_SURGERY_CENTER): Payer: Medicare Other | Admitting: Gastroenterology

## 2022-05-05 VITALS — BP 127/76 | HR 69 | Temp 97.5°F | Resp 16 | Ht 67.0 in | Wt 236.0 lb

## 2022-05-05 DIAGNOSIS — D508 Other iron deficiency anemias: Secondary | ICD-10-CM

## 2022-05-05 DIAGNOSIS — K2950 Unspecified chronic gastritis without bleeding: Secondary | ICD-10-CM | POA: Diagnosis not present

## 2022-05-05 DIAGNOSIS — K219 Gastro-esophageal reflux disease without esophagitis: Secondary | ICD-10-CM | POA: Diagnosis not present

## 2022-05-05 DIAGNOSIS — K31819 Angiodysplasia of stomach and duodenum without bleeding: Secondary | ICD-10-CM | POA: Diagnosis not present

## 2022-05-05 MED ORDER — ATORVASTATIN CALCIUM 40 MG PO TABS: ORAL_TABLET | ORAL | 3 refills | Status: DC

## 2022-05-05 MED ORDER — SODIUM CHLORIDE 0.9 % IV SOLN: 500.0000 mL | Freq: Once | INTRAVENOUS | Status: DC

## 2022-05-05 NOTE — Progress Notes (Signed)
Called to room to assist during endoscopic procedure.  Patient ID and intended procedure confirmed with present staff. Received instructions for my participation in the procedure from the performing physician.  

## 2022-05-05 NOTE — Op Note (Signed)
[facility] Patient Name: Emma Bonilla Procedure Date: 05/05/2022 10:16 AM MRN: [id number] Endoscopist: Remo Lipps P. Havery Moros , MD Age: 66 Referring MD:  Date of Birth: [birth year] Gender: Female Account #: [id number] Procedure:                Upper GI endoscopy Indications:              Iron deficiency anemia - resolved with iron                            supplementation (no cause on colonoscopy 07/2021),                            history of gastro-esophageal reflux disease on                            omeprazole '40mg'$  twice daily Medicines:                Monitored Anesthesia Care Procedure:                Pre-Anesthesia Assessment:                           - Prior to the procedure, a History and Physical                            was performed, and patient medications and                            allergies were reviewed. The patient's tolerance of                            previous anesthesia was also reviewed. The risks                            and benefits of the procedure and the sedation                            options and risks were discussed with the patient.                            All questions were answered, and informed consent                            was obtained. Prior Anticoagulants: The patient has                            taken Plavix (clopidogrel), last dose was 5 days                            prior to procedure. ASA Grade Assessment: III - A                            patient with severe systemic disease. After  reviewing the risks and benefits, the patient was                            deemed in satisfactory condition to undergo the                            procedure.                           After obtaining informed consent, the endoscope was                            passed under direct vision. Throughout the                            procedure, the patient's blood pressure, pulse, and                             oxygen saturations were monitored continuously. The                            Endoscope was introduced through the mouth, and                            advanced to the second part of duodenum. The upper                            GI endoscopy was accomplished without difficulty.                            The patient tolerated the procedure well. Scope In: Scope Out: Findings:                 Esophagogastric landmarks were identified: the                            Z-line was found at 39 cm, the gastroesophageal                            junction was found at 39 cm and the upper extent of                            the gastric folds was found at 39 cm from the                            incisors.                           The exam of the esophagus was otherwise normal.                           A few small angiodysplastic lesions with no                            bleeding were  found in the gastric body.                           Patchy mildly erythematous mucosa was found in the                            gastric fundus.                           The exam of the stomach was otherwise normal.                           Biopsies were taken with a cold forceps in the                            gastric body, at the incisura and in the gastric                            antrum for Helicobacter pylori testing.                           The examined duodenum was normal. Complications:            No immediate complications. Estimated blood loss:                            Minimal. Estimated Blood Loss:     Estimated blood loss was minimal. Impression:               - Esophagogastric landmarks identified.                           - Normal esophagus otherwise                           - Two non-bleeding angiodysplastic lesions in the                            stomach.                           - Erythematous mucosa in the gastric fundus.                           - Normal stomach  otherwise - biopsies taken to rule                            out H pylori                           - Normal examined duodenum.                           No evidence of Barrett's noted. Possible small AVMs                            are contributing to IDA in the  setting of Plavix                            use, which has resolved with iron supplementation.                            No concerning pathology noted. Recommendation:           - Patient has a contact number available for                            emergencies. The signs and symptoms of potential                            delayed complications were discussed with the                            patient. Return to normal activities tomorrow.                            Written discharge instructions were provided to the                            patient.                           - Resume previous diet.                           - Continue present medications.                           - Resume Plavix later today                           - Await pathology results with further                            recommendations Remo Lipps P. Mar Zettler, MD 05/05/2022 10:36:57 AM This report has been signed electronically.

## 2022-05-05 NOTE — Progress Notes (Signed)
Patient to call Dr. Doyne Keel office to schedule office appointment for rectal bleeding after checking her and her daughter's schedule.B.Tessa Lerner RN.

## 2022-05-05 NOTE — Patient Instructions (Addendum)
Please read handouts provided. Continue present medications. Await pathology results. Resume Plavix later today. Resume previous diet.  YOU HAD AN ENDOSCOPIC PROCEDURE TODAY AT [facility]:   Refer to the procedure report that was given to you for any specific questions about what was found during the examination.  If the procedure report does not answer your questions, please call your gastroenterologist to clarify.  If you requested that your care partner not be given the details of your procedure findings, then the procedure report has been included in a sealed envelope for you to review at your convenience later.  YOU SHOULD EXPECT: Some feelings of bloating in the abdomen. Passage of more gas than usual.  Walking can help get rid of the air that was put into your GI tract during the procedure and reduce the bloating. If you had a lower endoscopy (such as a colonoscopy or flexible sigmoidoscopy) you may notice spotting of blood in your stool or on the toilet paper. If you underwent a bowel prep for your procedure, you may not have a normal bowel movement for a few days.  Please Note:  You might notice some irritation and congestion in your nose or some drainage.  This is from the oxygen used during your procedure.  There is no need for concern and it should clear up in a day or so.  SYMPTOMS TO REPORT IMMEDIATELY:  Following upper endoscopy (EGD)  Vomiting of blood or coffee ground material  New chest pain or pain under the shoulder blades  Painful or persistently difficult swallowing  New shortness of breath  Fever of 100F or higher  Black, tarry-looking stools  For urgent or emergent issues, a gastroenterologist can be reached at any hour by calling [phone]. Do not use MyChart messaging for urgent concerns.    DIET:  We do recommend a small meal at first, but then you may proceed to your regular diet.  Drink plenty of fluids but you should avoid alcoholic  beverages for 24 hours.  ACTIVITY:  You should plan to take it easy for the rest of today and you should NOT DRIVE or use heavy machinery until tomorrow (because of the sedation medicines used during the test).    FOLLOW UP: Our staff will call the number listed on your records 24-72 hours following your procedure to check on you and address any questions or concerns that you may have regarding the information given to you following your procedure. If we do not reach you, we will leave a message.  We will attempt to reach you two times.  During this call, we will ask if you have developed any symptoms of COVID 19. If you develop any symptoms (ie: fever, flu-like symptoms, shortness of breath, cough etc.) before then, please call [phone].  If you test positive for Covid 19 in the 2 weeks post procedure, please call and report this information to [place].    If any biopsies were taken you will be contacted by phone or by letter within the next 1-3 weeks.  Please call us at [phone] if you have not heard about the biopsies in 3 weeks.    SIGNATURES/CONFIDENTIALITY: You and/or your care partner have signed paperwork which will be entered into your electronic medical record.  These signatures attest to the fact that that the information above on your After Visit Summary has been reviewed and is understood.  Full responsibility of the confidentiality of this discharge information lies with you  and/or your care-partner.

## 2022-05-05 NOTE — Progress Notes (Signed)
Pt's states no medical or surgical changes since previsit or office visit. 

## 2022-05-05 NOTE — Progress Notes (Signed)
[facility] History and Physical   Primary Care Physician:  Angelica Pou, MD   Reason for Procedure:   GERD, history of IDA with no cause on colonoscopy last year  Plan:    EGD     HPI: Emma Bonilla is a 66 y.o. female  here for EGD to evaluate chronic GERD and history of IDA. On omeprazole '40mg'$  BID with pretty good control of reflux now but has symptoms when she lies down. History of IDA on iron with resolution of iron deficiency, no cause on colonoscopy last year. EGD recommended last year but deferred until the patient completed having her dental work done (had loose teeth which have since been pulled). Otherwise feels well without any cardiopulmonary symptoms. She has been off Plavix for 5 days for this exam.   Past Medical History:  Diagnosis Date   Anemia    Arthritis    Depression    Diastolic dysfunction without heart failure    Family history of rectal cancer    GERD (gastroesophageal reflux disease)    Hepatic steatosis    a. seen on prior US.   Hyperlipidemia    Hypertension    Mild aortic regurgitation    Mild aortic stenosis    Premature atrial contractions    PVC's (premature ventricular contractions)    Thyroid disease    TIA (transient ischemic attack)    2010   Tobacco abuse     Past Surgical History:  Procedure Laterality Date   CORONARY STENT INTERVENTION N/A 10/13/2017   Procedure: CORONARY STENT INTERVENTION;  Surgeon: Leonie Man, MD;  Location: [facility];  Service: Cardiovascular;  Laterality: N/A;   DILATION AND CURETTAGE OF UTERUS     30 years ago   FOOT SURGERY     LEFT HEART CATH AND CORONARY ANGIOGRAPHY N/A 10/13/2017   Procedure: LEFT HEART CATH AND CORONARY ANGIOGRAPHY;  Surgeon: Leonie Man, MD;  Location: [facility];  Service: Cardiovascular;  Laterality: N/A;    Prior to Admission medications   Medication Sig Start Date End Date Taking? Authorizing Provider  atorvastatin (LIPITOR) 40  MG tablet TAKE 1 TABLET BY MOUTH ONCE DAILY AT 6 PM 04/30/22  Yes Hilty, Nadean Corwin, MD  buPROPion [facility] SR) 150 MG 12 hr tablet TAKE 1 TABLET ('150MG'$  TOTAL) BY MOUTH 2 TIMES A DAY 04/22/22  Yes Angelica Pou, MD  calcium-vitamin D (OSCAL WITH D) 500-200 MG-UNIT per tablet Take 1 tablet by mouth daily. 08/08/13  Yes Jones Bales, MD  ezetimibe (ZETIA) 10 MG tablet Take 1 tablet (10 mg total) by mouth daily. 04/22/22  Yes Angelica Pou, MD  ferrous sulfate 325 (65 FE) MG tablet Take 1 tablet (325 mg total) by mouth daily. 03/31/18  Yes Santos-Sanchez, Merlene Morse, MD  levothyroxine (SYNTHROID) 100 MCG tablet TAKE 1 TABLET (100MCG TOTAL) BY MOUTH DAILY. 04/22/22  Yes Angelica Pou, MD  losartan-hydrochlorothiazide (HYZAAR) 100-25 MG tablet Take 1 tablet by mouth daily. 04/22/22  Yes Angelica Pou, MD  Melatonin 3 MG TABS Take 3 mg by mouth at bedtime.   Yes [provider]  metFORMIN (GLUCOPHAGE) 500 MG tablet Take 1 tablet (500 mg total) by mouth daily. 04/22/22 04/22/23 Yes Angelica Pou, MD  metoprolol succinate (TOPROL-XL) 25 MG 24 hr tablet Take 1 tablet (25 mg total) by mouth daily. 04/17/22  Yes Angelica Pou, MD  omeprazole (PRILOSEC) 40 MG capsule Take 1 capsule (40 mg total) by  mouth 2 (two) times daily. 02/27/22 05/28/22 Yes Angelica Pou, MD  clopidogrel (PLAVIX) 75 MG tablet Take 1 tablet (75 mg total) by mouth daily with breakfast. 04/22/22   Angelica Pou, MD  EQ ALLERGY RELIEF, CETIRIZINE, 10 MG tablet Take 1 tablet by mouth once daily 02/14/22   Angelica Pou, MD  PAZEO 0.7 % SOLN PLACE 1 DROP TO EYE DAILY. 08/15/19   Welford Roche, MD    Current Outpatient Medications  Medication Sig Dispense Refill   atorvastatin (LIPITOR) 40 MG tablet TAKE 1 TABLET BY MOUTH ONCE DAILY AT 6 PM 30 tablet 0   buPROPion (WELLBUTRIN SR) 150 MG 12 hr tablet TAKE 1 TABLET ('150MG'$  TOTAL) BY MOUTH 2 TIMES A DAY 180 tablet 3    calcium-vitamin D (OSCAL WITH D) 500-200 MG-UNIT per tablet Take 1 tablet by mouth daily. 30 tablet 3   ezetimibe (ZETIA) 10 MG tablet Take 1 tablet (10 mg total) by mouth daily. 90 tablet 1   ferrous sulfate 325 (65 FE) MG tablet Take 1 tablet (325 mg total) by mouth daily. 30 tablet 3   levothyroxine (SYNTHROID) 100 MCG tablet TAKE 1 TABLET (100MCG TOTAL) BY MOUTH DAILY. 90 tablet 3   losartan-hydrochlorothiazide (HYZAAR) 100-25 MG tablet Take 1 tablet by mouth daily. 30 tablet 0   Melatonin 3 MG TABS Take 3 mg by mouth at bedtime.     metFORMIN (GLUCOPHAGE) 500 MG tablet Take 1 tablet (500 mg total) by mouth daily. 90 tablet 3   metoprolol succinate (TOPROL-XL) 25 MG 24 hr tablet Take 1 tablet (25 mg total) by mouth daily. 90 tablet 3   omeprazole (PRILOSEC) 40 MG capsule Take 1 capsule (40 mg total) by mouth 2 (two) times daily. 180 capsule 2   clopidogrel (PLAVIX) 75 MG tablet Take 1 tablet (75 mg total) by mouth daily with breakfast. 90 tablet 3   EQ ALLERGY RELIEF, CETIRIZINE, 10 MG tablet Take 1 tablet by mouth once daily 90 tablet 0   PAZEO 0.7 % SOLN PLACE 1 DROP TO EYE DAILY. 7.5 mL 0   Current Facility-Administered Medications  Medication Dose Route Frequency Provider Last Rate Last Admin   0.9 %  sodium chloride infusion  500 mL Intravenous Once Lakindra Wible, Carlota Raspberry, MD        Allergies as of 05/05/2022 - Review Complete 05/05/2022  Allergen Reaction Noted   Naproxen sodium Rash 07/25/2011    Family History  Problem Relation Age of Onset   Hypertension Mother    Emphysema Mother    Cancer Mother        lung   Heart failure Mother        CHF   Heart disease Father        MI at 96   Hypertension Father    Crohn's disease Father    Heart disease Sister        leaky valve   Hypertension Sister    Colon cancer Sister        33 Mets   Skin cancer Maternal Grandmother    Stroke Maternal Grandfather    Diabetes Brother    Liver cancer Brother    Hypertension Sister     Hyperlipidemia Sister    Heart disease Sister        LVAD 2014-MI in her 72s   Hypertension Sister    Crohn's disease Sister    Congestive Heart Failure Sister    Cancer Sister    Hyperlipidemia Sister  Hypertension Child    Hyperlipidemia Child    Hyperlipidemia Child    Pancreatic cancer Neg Hx    Stomach cancer Neg Hx    Esophageal cancer Neg Hx     Social History   Socioeconomic History   Marital status: Married    Spouse name: Not on file   Number of children: 3   Years of education: 12   Highest education level: 12th grade  Occupational History   Not on file  Tobacco Use   Smoking status: Light Smoker    Packs/day: 0.50    Years: 45.00    Pack years: 22.50    Types: Cigarettes   Smokeless tobacco: Never   Tobacco comments:    2 cigs per day  Vaping Use   Vaping Use: Never used  Substance and Sexual Activity   Alcohol use: Yes    Comment: holidays and special occasion   Drug use: Yes    Frequency: 1.0 times per week    Types: Marijuana    Comment: once weekly or once every 2 weeks   Sexual activity: Yes    Birth control/protection: None  Other Topics Concern   Not on file  Social History Narrative   Epworth Sleepiness Scale (08/21/15) = 9   Social Determinants of Health   Financial Resource Strain: Not on file  Food Insecurity: Not on file  Transportation Needs: Not on file  Physical Activity: Not on file  Stress: Not on file  Social Connections: Not on file  Intimate Partner Violence: Not on file    Review of Systems: All other review of systems negative except as mentioned in the HPI.  Physical Exam: Vital signs BP 126/74   Pulse 70   Temp (!) 97.5 F (36.4 C) (Temporal)   Ht '5\' 7"'$  (1.702 m)   Wt 236 lb (107 kg)   LMP 02/11/2011 Comment: no chance of pregnancy per pt  SpO2 97%   BMI 36.96 kg/m   General:   Alert,  Well-developed, pleasant and cooperative in NAD Lungs:  Clear throughout to auscultation.   Heart:  Regular rate  and rhythm Abdomen:  Soft, nontender and nondistended.   Neuro/Psych:  Alert and cooperative. Normal mood and affect. A and O x 3  Jolly Mango, MD [facility]

## 2022-05-05 NOTE — Progress Notes (Signed)
To pacu, VSS. Report to Rn.tb 

## 2022-05-06 ENCOUNTER — Telehealth: Payer: Self-pay

## 2022-05-06 ENCOUNTER — Other Ambulatory Visit: Payer: Self-pay

## 2022-05-06 MED ORDER — ATORVASTATIN CALCIUM 40 MG PO TABS: ORAL_TABLET | ORAL | 3 refills | Status: AC

## 2022-05-06 MED ORDER — EZETIMIBE 10 MG PO TABS: 10.0000 mg | ORAL_TABLET | Freq: Every day | ORAL | 3 refills | Status: DC

## 2022-05-06 MED ORDER — CLOPIDOGREL BISULFATE 75 MG PO TABS: 75.0000 mg | ORAL_TABLET | Freq: Every day | ORAL | 3 refills | Status: AC

## 2022-05-06 NOTE — Telephone Encounter (Signed)
  Follow up Call-     05/05/2022    9:25 AM 07/15/2021    1:55 PM  Call back number  Post procedure Call Back phone  # [phone]  Permission to leave phone message Yes Yes     Patient questions:  Do you have a fever, pain , or abdominal swelling? No. Pain Score  0 *  Have you tolerated food without any problems? Yes.    Have you been able to return to your normal activities? Yes.    Do you have any questions about your discharge instructions: Diet   No. Medications  No. Follow up visit  No.  Do you have questions or concerns about your Care? No.  Actions: * If pain score is 4 or above: No action needed, pain <4.

## 2022-05-12 NOTE — Addendum Note (Signed)
Encounter addended by: Annie Paras on: 05/12/2022 12:53 PM  Actions taken: Letter saved

## 2022-05-16 ENCOUNTER — Telehealth: Payer: Self-pay | Admitting: *Deleted

## 2022-05-16 NOTE — Telephone Encounter (Signed)
Patient called in c/o elevated BP 161/101 right arm and 151/90 left arm. States she has had a h/a and lightheadedness "all week." She was advised to head directly to ED as she is symptomatic. States she has someone to drive her now. She was also advised to keep appt at [facility] on 6/19. She has appt with cardiology on 6/30.

## 2022-05-18 NOTE — Progress Notes (Unsigned)
   CC: bp follow up  HPI:  [name] is a 66 y.o. with medical history as below presenting to [facility] for bp follow up.   Please see problem-based list for further details, assessments, and plans.  Past Medical History:  Diagnosis Date   Anemia    Arthritis    Depression    Diastolic dysfunction without heart failure    Family history of rectal cancer    GERD (gastroesophageal reflux disease)    Hepatic steatosis    a. seen on prior US.   Hyperlipidemia    Hypertension    Mild aortic regurgitation    Mild aortic stenosis    Premature atrial contractions    PVC's (premature ventricular contractions)    Thyroid disease    TIA (transient ischemic attack)    2010   Tobacco abuse     Current Outpatient Medications (Endocrine & Metabolic):    levothyroxine (SYNTHROID) 100 MCG tablet, TAKE 1 TABLET (100MCG TOTAL) BY MOUTH DAILY.   metFORMIN (GLUCOPHAGE) 500 MG tablet, Take 1 tablet (500 mg total) by mouth daily.  Current Outpatient Medications (Cardiovascular):    atorvastatin (LIPITOR) 40 MG tablet, TAKE 1 TABLET BY MOUTH ONCE DAILY AT 6 PM   ezetimibe (ZETIA) 10 MG tablet, Take 1 tablet (10 mg total) by mouth daily.   losartan-hydrochlorothiazide (HYZAAR) 100-25 MG tablet, Take 1 tablet by mouth daily.   metoprolol succinate (TOPROL-XL) 25 MG 24 hr tablet, Take 1 tablet (25 mg total) by mouth daily.  Current Outpatient Medications (Respiratory):    EQ ALLERGY RELIEF, CETIRIZINE, 10 MG tablet, Take 1 tablet by mouth once daily   Current Outpatient Medications (Hematological):    clopidogrel (PLAVIX) 75 MG tablet, Take 1 tablet (75 mg total) by mouth daily with breakfast.   ferrous sulfate 325 (65 FE) MG tablet, Take 1 tablet (325 mg total) by mouth daily.  Current Outpatient Medications (Other):    buPROPion (WELLBUTRIN SR) 150 MG 12 hr tablet, TAKE 1 TABLET ('150MG'$  TOTAL) BY MOUTH 2 TIMES A DAY   calcium-vitamin D (OSCAL WITH D) 500-200 MG-UNIT per tablet, Take 1  tablet by mouth daily.   Melatonin 3 MG TABS, Take 3 mg by mouth at bedtime.   omeprazole (PRILOSEC) 40 MG capsule, Take 1 capsule (40 mg total) by mouth 2 (two) times daily.   PAZEO 0.7 % SOLN, PLACE 1 DROP TO EYE DAILY.  Review of Systems:  Review of system negative unless stated in the problem list or HPI.    Physical Exam:  Vitals:   05/19/22 0903 05/19/22 0931  BP: (!) 160/94 (!) 141/86  Pulse: 75 79  Temp: 98.5 F (36.9 C)   TempSrc: Oral   SpO2: 98%   Weight: 227 lb 1.6 oz (103 kg)   Height: '5\' 7"'$  (1.702 m)     Physical Exam General: NAD HENT: NCAT Lungs: CTAB, no wheeze, rhonchi or rales.  Cardiovascular: Normal heart sounds, no r/m/g, 2+ pulses in all extremities. No LE edema Abdomen: No TTP, normal bowel sounds MSK: No asymmetry or muscle atrophy.  Skin: no lesions noted on exposed skin Neuro: Alert and oriented x4. CN grossly intact Psych: Normal mood and normal affect   Assessment & Plan:   See Encounters Tab for problem based charting.  Patient discussed with Dr. Roderic Ovens, MD

## 2022-05-19 ENCOUNTER — Ambulatory Visit (INDEPENDENT_AMBULATORY_CARE_PROVIDER_SITE_OTHER): Payer: Medicare Other | Admitting: Internal Medicine

## 2022-05-19 ENCOUNTER — Encounter: Payer: Self-pay | Admitting: Internal Medicine

## 2022-05-19 VITALS — BP 141/86 | HR 79 | Temp 98.5°F | Ht 67.0 in | Wt 227.1 lb

## 2022-05-19 DIAGNOSIS — F17210 Nicotine dependence, cigarettes, uncomplicated: Secondary | ICD-10-CM | POA: Diagnosis not present

## 2022-05-19 DIAGNOSIS — I10 Essential (primary) hypertension: Secondary | ICD-10-CM

## 2022-05-19 DIAGNOSIS — G4733 Obstructive sleep apnea (adult) (pediatric): Secondary | ICD-10-CM | POA: Diagnosis not present

## 2022-05-19 DIAGNOSIS — Z9189 Other specified personal risk factors, not elsewhere classified: Secondary | ICD-10-CM

## 2022-05-19 DIAGNOSIS — Z23 Encounter for immunization: Secondary | ICD-10-CM

## 2022-05-19 DIAGNOSIS — Z299 Encounter for prophylactic measures, unspecified: Secondary | ICD-10-CM | POA: Insufficient documentation

## 2022-05-19 MED ORDER — AMLODIPINE BESYLATE 5 MG PO TABS: 5.0000 mg | ORAL_TABLET | Freq: Every day | ORAL | 0 refills | Status: DC

## 2022-05-19 NOTE — Assessment & Plan Note (Signed)
TDaP given, and Dexa scan ordered this visit.

## 2022-05-19 NOTE — Assessment & Plan Note (Addendum)
BP at not at goal. BP in clinic today 160/94, HR 75. Reported home readings with SBP 140-160s, and Dia 80s-100s. Attempts to check BP twice at home. Home medications include Hyzaar 100-25 mg qd, toprol 25 mg qd. Reports good medication compliance. Tolerating medication without adverse effects. Denies headaches, vision changes, lightheadedness, chest pain, SHOB, leg swelling or changes in speech. Exam benign and vitals otherwise wnl. Last creatinine was 1.11 in 11/21. Counseled on the importance of daily exercise, low salt diet, and weight loss. Her elevated bp may be secondary to untreated OSA (see tab). She is smokes 3-4 cigs per day down from 1 ppd in 2018, I congratulated patient on the decrease and encouraged cessation.  -Continue Hyzaar 100-25 mg, Toprol 25 mg qd, and start Amlodipine 5 mg qd.  -BP log and bring to next visit  -Continue lifestyle changes

## 2022-05-19 NOTE — Patient Instructions (Addendum)
Emma Bonilla, it was a pleasure seeing you today! You endorsed feeling well today. Below are some of the things we talked about this visit. We look forward to seeing you in the follow up appointment!  Today we discussed: You presented today for blood pressure follow-up.  Your blood pressure has been running high at home and it was high in the clinic.  We are adding on blood pressure medication called amlodipine.  Please take this medication every day.  High blood pressure may be due to the sleep apnea as you do not have your supplies yet to wear at night.  Sleep apnea can cause uncontrolled blood pressure.  We will reach out to ensure you get the supplies you need.  I will check for you to follow-up in 3 weeks to recheck your blood pressure and adjust your medications and repeat lab work.  I have ordered the following labs today:  Lab Orders  No laboratory test(s) ordered today      Referrals ordered today:   Referral Orders  No referral(s) requested today     I have ordered the following medication/changed the following medications:   Stop the following medications: There are no discontinued medications.   Start the following medications: Meds ordered this encounter  Medications   amLODipine (NORVASC) 5 MG tablet    Sig: Take 1 tablet (5 mg total) by mouth daily.    Dispense:  30 tablet    Refill:  0     Follow-up: 3 week follow up for BP and lab work  Please make sure to arrive 15 minutes prior to your next appointment. If you arrive late, you may be asked to reschedule.   We look forward to seeing you next time. Please call our clinic at [phone] if you have any questions or concerns. The best time to call is Monday-Friday from 9am-4pm, but there is someone available 24/7. If after hours or the weekend, call the main hospital number and ask for the Internal Medicine Resident On-Call. If you need medication refills, please notify your pharmacy one week in advance and  they will send [place] a request.  Thank you for letting us take part in your care. Wishing you the best!  Thank you, Idamae Schuller, MD

## 2022-05-19 NOTE — Progress Notes (Signed)
Internal Medicine Clinic Attending  Case discussed with the resident at the time of the visit.  We reviewed the resident's history and exam and pertinent patient test results.  I agree with the assessment, diagnosis, and plan of care documented in the resident's note.  

## 2022-05-20 NOTE — Assessment & Plan Note (Signed)
Patient had sleep study done that showed moderate OSA with AHI of 21.9. She has not received her CPAP yet. Ordered placed this visit for CPAP. This should help patient with controlling her hypertension as well.  -Start CPAP nightly

## 2022-05-21 NOTE — Addendum Note (Signed)
Addended by: Marcelino Duster on: 05/21/2022 09:43 AM   Modules accepted: Orders

## 2022-05-22 ENCOUNTER — Encounter: Payer: Medicare Other | Admitting: Behavioral Health

## 2022-05-29 ENCOUNTER — Other Ambulatory Visit: Payer: Self-pay

## 2022-05-29 MED ORDER — CETIRIZINE HCL 10 MG PO TABS: 10.0000 mg | ORAL_TABLET | Freq: Every day | ORAL | 0 refills | Status: DC

## 2022-05-30 ENCOUNTER — Ambulatory Visit (INDEPENDENT_AMBULATORY_CARE_PROVIDER_SITE_OTHER): Payer: Medicare Other | Admitting: Internal Medicine

## 2022-05-30 ENCOUNTER — Encounter (HOSPITAL_BASED_OUTPATIENT_CLINIC_OR_DEPARTMENT_OTHER): Payer: Self-pay | Admitting: Internal Medicine

## 2022-05-30 VITALS — BP 128/64 | HR 73 | Ht 67.0 in | Wt 228.0 lb

## 2022-05-30 DIAGNOSIS — I10 Essential (primary) hypertension: Secondary | ICD-10-CM | POA: Diagnosis not present

## 2022-05-30 DIAGNOSIS — E782 Mixed hyperlipidemia: Secondary | ICD-10-CM

## 2022-05-30 DIAGNOSIS — I350 Nonrheumatic aortic (valve) stenosis: Secondary | ICD-10-CM

## 2022-05-30 DIAGNOSIS — Z9861 Coronary angioplasty status: Secondary | ICD-10-CM | POA: Diagnosis not present

## 2022-05-30 DIAGNOSIS — I2510 Atherosclerotic heart disease of native coronary artery without angina pectoris: Secondary | ICD-10-CM

## 2022-05-30 NOTE — Progress Notes (Signed)
OFFICE NOTE  Chief Complaint:  Follow-up  Primary Care Physician: Angelica Pou, MD  HPI:  Emma Bonilla is a pleasant 66 year old female is currently referred to me from the internal medicine clinic for evaluation of palpitations. Ms. Xu has a strong family history of heart disease with both her mother who had congestive heart failure in her father who had died of an MI. She has a personal history of hypertension, dyslipidemia, about a 40-50 pack year smoking history and a known murmur. She presents for evaluation of palpitations. She reports this is been going on for about 2 months and mostly takes place at rest. She notices at night. There are episodes were heart races for several seconds and then goes back to normal. She said she's had 2 or 3 of these episodes in the past month. She does report a prior history of TIA in 2010 and started taking aspirin after that. She has good control of her cholesterol on Crestor. She denies any chest pain or worsening shortness of breath. She's never had presyncope or syncopal symptoms.  Mrs. Carcione returns today for follow-up of echo and monitor. The echocardiogram shows an EF of 50-53% with diastolic dysfunction. Additionally there was mild aortic stenosis with a functionally bicuspid valve which is moderately calcified. There was moderate regurgitation. The mean gradient was 13 mmHg. She also had a monitor which demonstrated PACs and PVCs but otherwise sinus rhythm. No A. fib or ventricular arrhythmias were noted. She reports her palpitations have picked up some since she wore the monitor however suspect it's more of the same. She denies any cardiac sounding chest pain or worsening shortness of breath with exertion although I have a high retest probability of coronary disease.  I saw Ms. Livingston back today in follow-up. She has since been started on low-dose beta blocker and is tolerating it well. She says this is significantly help with her  palpitations and she has no more symptoms.  06/16/2016  Mrs. Szafran was seen back today in follow-up. She denies any worsening palpitations. EKG shows normal sinus rhythm. Unfortunate she's had close to 10 pound weight gain. She attributed this to stopping smoking, which I commended her on. I have however encourage her to work on some more exercise.  01/19/2017  Mrs. Grantham was seen today in follow-up. She recently got the news that her sister has stage IV colon cancer. Based on these findings she is concerned that she may also have colon cancer and has scheduled an upcoming colonoscopy on February 26. Given her establish cardiac history, preoperative risk assessment was recommended. She last had an echo more than a year ago which showed mild aortic stenosis and a functionally bicuspid aortic valve with moderate aortic insufficiency. LVEF is 65-70%. She also had hypertension which was not adequately controlled however blood pressure today is improved at 134/78. She denies any chest pain or worsening shortness of breath.  11/19/2017  Mrs. Sedivy returns today for follow-up of her hospitalization.  She recently was admitted with unstable angina and underwent left heart catheterization by Dr. Ellyn Hack on 10/13/2017.  This demonstrated percent stenosis to the mid RCA and 70% proximal RCA stenosis.  She underwent extensive overlapping 3.0 and 3.25 x 38 mm drug-eluting stents.  LVEF was preserved at 60%.  It was noted she has mild aortic valve stenosis with a peak gradient between 25 and 30 mmHg.  Since discharge she has had marked improvement in her symptoms.  She denies any significant shortness  of breath.  She occasionally gets chest discomfort which is improved with Tylenol and worse with lifting that sounds musculoskeletal.  She is scheduled to start cardiac rehab on January 14.  She has had no side effects of high-dose atorvastatin, which was started in the hospital.  In addition I increased her losartan  and blood pressure is at goal today 132/89.  The good news is she reported she stopped smoking about 4 days ago.  She says she is going to be committed to this.  She is concerned because her sister has a LVAD.  05/03/2018  Mrs. Xie returns today for follow-up.  She was recently hospitalized again with chest pain.  She ruled out for MI.  It was felt that her symptoms were related to reflux.  She had worsening belching and reported she ran out of her pantoprazole.  This was reordered however it only 20 mg.  She was previously taking 40 mg.  She denies any worsening symptoms.  Is been more than a year since her last echo and she was noted to have at least mild aortic stenosis.  Her murmur is difficult to auscultate.  05/30/2022  Mrs. Christiansen returns today for follow-up.  Overall she says she is doing well.  About 6 weeks ago she had a spike in her blood pressure.  She does have a diagnosis of obstructive sleep apnea but has been waiting for about 2 years to get equipment.  She said she was just contacted yesterday.  Her primary care provider added amlodipine and blood pressure appears to be better controlled today 128/64.  She also had an increase in her metoprolol.  She denies any chest pain or worsening shortness of breath.  She did have an echo in 2022 which showed mild aortic stenosis.  PMHx:  Past Medical History:  Diagnosis Date   Anemia    Arthritis    Depression    Diastolic dysfunction without heart failure    Family history of rectal cancer    GERD (gastroesophageal reflux disease)    Hepatic steatosis    a. seen on prior US.   Hyperlipidemia    Hypertension    Mild aortic regurgitation    Mild aortic stenosis    Premature atrial contractions    PVC's (premature ventricular contractions)    Thyroid disease    TIA (transient ischemic attack)    2010   Tobacco abuse     Past Surgical History:  Procedure Laterality Date   CORONARY STENT INTERVENTION N/A 10/13/2017    Procedure: CORONARY STENT INTERVENTION;  Surgeon: Leonie Man, MD;  Location: [facility];  Service: Cardiovascular;  Laterality: N/A;   DILATION AND CURETTAGE OF UTERUS     30 years ago   FOOT SURGERY     LEFT HEART CATH AND CORONARY ANGIOGRAPHY N/A 10/13/2017   Procedure: LEFT HEART CATH AND CORONARY ANGIOGRAPHY;  Surgeon: Leonie Man, MD;  Location: [facility];  Service: Cardiovascular;  Laterality: N/A;    FAMHx:  Family History  Problem Relation Age of Onset   Hypertension Mother    Emphysema Mother    Cancer Mother        lung   Heart failure Mother        CHF   Heart disease Father        MI at 30   Hypertension Father    Crohn's disease Father    Heart disease Sister  leaky valve   Hypertension Sister    Colon cancer Sister        14 Mets   Skin cancer Maternal Grandmother    Stroke Maternal Grandfather    Diabetes Brother    Liver cancer Brother    Hypertension Sister    Hyperlipidemia Sister    Heart disease Sister        LVAD 2014-MI in her 56s   Hypertension Sister    Crohn's disease Sister    Congestive Heart Failure Sister    Cancer Sister    Hyperlipidemia Sister    Hypertension Child    Hyperlipidemia Child    Hyperlipidemia Child    Pancreatic cancer Neg Hx    Stomach cancer Neg Hx    Esophageal cancer Neg Hx     SOCHx:   reports that she has been smoking cigarettes. She has a 22.50 pack-year smoking history. She has never used smokeless tobacco. She reports current alcohol use. She reports current drug use. Frequency: 1.00 time per week. Drug: Marijuana.  ALLERGIES:  Allergies  Allergen Reactions   Naproxen Sodium Rash    ROS: Pertinent items noted in HPI and remainder of comprehensive ROS otherwise negative.  HOME MEDS: Current Outpatient Medications  Medication Sig Dispense Refill   amLODipine (NORVASC) 5 MG tablet Take 1 tablet (5 mg total) by mouth daily. 30 tablet 0   atorvastatin (LIPITOR) 40 MG  tablet TAKE 1 TABLET BY MOUTH ONCE DAILY AT 6 PM 90 tablet 3   buPROPion (WELLBUTRIN SR) 150 MG 12 hr tablet TAKE 1 TABLET ('150MG'$  TOTAL) BY MOUTH 2 TIMES A DAY 180 tablet 3   calcium-vitamin D (OSCAL WITH D) 500-200 MG-UNIT per tablet Take 1 tablet by mouth daily. 30 tablet 3   cetirizine (EQ ALLERGY RELIEF, CETIRIZINE,) 10 MG tablet Take 1 tablet (10 mg total) by mouth daily. 90 tablet 0   clopidogrel (PLAVIX) 75 MG tablet Take 1 tablet (75 mg total) by mouth daily with breakfast. 90 tablet 3   ezetimibe (ZETIA) 10 MG tablet Take 1 tablet (10 mg total) by mouth daily. 90 tablet 3   ferrous sulfate 325 (65 FE) MG tablet Take 1 tablet (325 mg total) by mouth daily. 30 tablet 3   levothyroxine (SYNTHROID) 100 MCG tablet TAKE 1 TABLET (100MCG TOTAL) BY MOUTH DAILY. 90 tablet 3   losartan-hydrochlorothiazide (HYZAAR) 100-25 MG tablet Take 1 tablet by mouth daily. 30 tablet 0   Melatonin 3 MG TABS Take 3 mg by mouth at bedtime.     metFORMIN (GLUCOPHAGE) 500 MG tablet Take 1 tablet (500 mg total) by mouth daily. 90 tablet 3   metoprolol succinate (TOPROL-XL) 25 MG 24 hr tablet Take 1 tablet (25 mg total) by mouth daily. 90 tablet 3   omeprazole (PRILOSEC) 40 MG capsule Take 1 capsule (40 mg total) by mouth 2 (two) times daily. 180 capsule 2   PAZEO 0.7 % SOLN PLACE 1 DROP TO EYE DAILY. 7.5 mL 0   No current facility-administered medications for this visit.    LABS/IMAGING: No results found for this or any previous visit (from the past 48 hour(s)). No results found.  WEIGHTS: Wt Readings from Last 3 Encounters:  05/30/22 228 lb (103.4 kg)  05/19/22 227 lb 1.6 oz (103 kg)  05/05/22 236 lb (107 kg)    VITALS: BP 128/64   Pulse 73   Ht '5\' 7"'$  (1.702 m)   Wt 228 lb (103.4 kg)   LMP 02/11/2011 Comment: no  chance of pregnancy per pt  SpO2 97%   BMI 35.71 kg/m   EXAM: General appearance: alert and no distress Neck: no carotid bruit, no JVD and thyroid not enlarged, symmetric, no  tenderness/mass/nodules Lungs: clear to auscultation bilaterally Heart: regular rate and rhythm, systolic murmur: early systolic 2/6, crescendo at 2nd right intercostal space and diastolic murmur: mid diastolic 2/6, blowing at lower left sternal border Abdomen: soft, non-tender; bowel sounds normal; no masses,  no organomegaly and obese Extremities: extremities normal, atraumatic, no cyanosis or edema Pulses: 2+ and symmetric Skin: Skin color, texture, turgor normal. No rashes or lesions Neurologic: Grossly normal Psych: Pleasant  EKG: Normal sinus rhythm at 73, low voltage QRS-personally reviewed  ASSESSMENT: Unstable angina - s/p PCI to the RCA with overlapping DES (10/2017) Palpitations - PACs and PVCs - resolved with beta blocker Mild aortic stenosis with a functionally bicuspid valve, moderate aortic insufficiency, EF 65-70% Hypertension Dyslipidemia GERD  PLAN: 1.   Mrs. Whipkey seems to be doing well now with better blood pressure control after her primary care provider had added additional medications.  She is overdue for repeat lipid which was last in April 2022 demonstrating a well-controlled LDL of 61.  We will go ahead and order that today as she is fasting.  Her aortic stenosis murmur is stable however I will like to repeat that prior to her next visit in 1 year.  Otherwise no medication changes today.  Plan follow-up with me annually or sooner as necessary.  Pixie Casino, MD, [facility], [place] Director of the Advanced Lipid Disorders &  Cardiovascular Risk Reduction Clinic Attending Cardiologist  Direct Dial: [phone]  Fax: [phone]  Website:  [email or web address] 05/30/2022, 11:15 AM

## 2022-05-30 NOTE — Patient Instructions (Signed)
Medication Instructions:  Your physician recommends that you continue on your current medications as directed. Please refer to the Current Medication list given to you today.  *If you need a refill on your cardiac medications before your next appointment, please call your pharmacy*   Lab Work: LIPID PANEL today -- 3rd Floor  If you have labs (blood work) drawn today and your tests are completely normal, you will receive your results only by: MyChart Message (if you have MyChart) OR A paper copy in the mail If you have any lab test that is abnormal or we need to change your treatment, we will call you to review the results.   Testing/Procedures: Your physician has requested that you have an echocardiogram. Echocardiography is a painless test that uses sound waves to create images of your heart. It provides your doctor with information about the size and shape of your heart and how well your heart's chambers and valves are working. This procedure takes approximately one hour. There are no restrictions for this procedure. -- due June 2024    Follow-Up: At [facility], you and your health needs are our priority.  As part of our continuing mission to provide you with exceptional heart care, we have created designated Provider Care Teams.  These Care Teams include your primary Cardiologist (physician) and Advanced Practice Providers (APPs -  Physician Assistants and Nurse Practitioners) who all work together to provide you with the care you need, when you need it.  We recommend signing up for the patient portal called "MyChart".  Sign up information is provided on this After Visit Summary.  MyChart is used to connect with patients for Virtual Visits (Telemedicine).  Patients are able to view lab/test results, encounter notes, upcoming appointments, etc.  Non-urgent messages can be sent to your provider as well.   To learn more about what you can do with MyChart, go to [email or web address].     Your next appointment:   12 month(s)  The format for your next appointment:   In Person  Provider:   Pixie Casino, MD {

## 2022-05-31 LAB — LIPID PANEL
Chol/HDL Ratio: 3.2 ratio (ref 0.0–4.4)
Cholesterol, Total: 106 mg/dL (ref 100–199)
HDL: 33 mg/dL — ABNORMAL LOW (ref >39)
LDL Chol Calc (NIH): 56 mg/dL (ref 0–99)
Triglycerides: 82 mg/dL (ref 0–149)
VLDL Cholesterol Cal: 17 mg/dL (ref 5–40)

## 2022-06-05 ENCOUNTER — Other Ambulatory Visit: Payer: Self-pay | Admitting: Internal Medicine

## 2022-06-05 DIAGNOSIS — Z1231 Encounter for screening mammogram for malignant neoplasm of breast: Secondary | ICD-10-CM

## 2022-06-10 ENCOUNTER — Other Ambulatory Visit: Payer: Self-pay | Admitting: Internal Medicine

## 2022-06-10 DIAGNOSIS — I10 Essential (primary) hypertension: Secondary | ICD-10-CM

## 2022-06-10 MED ORDER — AMLODIPINE BESYLATE 5 MG PO TABS: 5.0000 mg | ORAL_TABLET | Freq: Every day | ORAL | 0 refills | Status: DC

## 2022-06-10 NOTE — Telephone Encounter (Signed)
Refill Request  Pt requesting her medication be sent to the correct Pharmacy listed Below which is Mail Order Not the [facility].  amLODipine (NORVASC) 5 MG tablet    [facility] ([facility] ) - [place], [place] (Ph: [phone])

## 2022-06-12 ENCOUNTER — Ambulatory Visit (INDEPENDENT_AMBULATORY_CARE_PROVIDER_SITE_OTHER): Payer: Medicare Other | Admitting: Dietician

## 2022-06-12 ENCOUNTER — Encounter: Payer: Self-pay | Admitting: Dietician

## 2022-06-12 ENCOUNTER — Ambulatory Visit (INDEPENDENT_AMBULATORY_CARE_PROVIDER_SITE_OTHER): Payer: Medicare Other | Admitting: Internal Medicine

## 2022-06-12 VITALS — BP 118/67 | HR 81 | Temp 98.6°F | Ht 67.0 in | Wt 226.0 lb

## 2022-06-12 DIAGNOSIS — F17210 Nicotine dependence, cigarettes, uncomplicated: Secondary | ICD-10-CM

## 2022-06-12 DIAGNOSIS — K219 Gastro-esophageal reflux disease without esophagitis: Secondary | ICD-10-CM

## 2022-06-12 DIAGNOSIS — E669 Obesity, unspecified: Secondary | ICD-10-CM | POA: Diagnosis not present

## 2022-06-12 DIAGNOSIS — I10 Essential (primary) hypertension: Secondary | ICD-10-CM

## 2022-06-12 DIAGNOSIS — Z23 Encounter for immunization: Secondary | ICD-10-CM | POA: Diagnosis not present

## 2022-06-12 DIAGNOSIS — Z7984 Long term (current) use of oral hypoglycemic drugs: Secondary | ICD-10-CM

## 2022-06-12 DIAGNOSIS — R202 Paresthesia of skin: Secondary | ICD-10-CM

## 2022-06-12 DIAGNOSIS — F4329 Adjustment disorder with other symptoms: Secondary | ICD-10-CM

## 2022-06-12 DIAGNOSIS — Z6835 Body mass index (BMI) 35.0-35.9, adult: Secondary | ICD-10-CM

## 2022-06-12 DIAGNOSIS — J3089 Other allergic rhinitis: Secondary | ICD-10-CM

## 2022-06-12 DIAGNOSIS — I350 Nonrheumatic aortic (valve) stenosis: Secondary | ICD-10-CM | POA: Diagnosis not present

## 2022-06-12 DIAGNOSIS — R200 Anesthesia of skin: Secondary | ICD-10-CM

## 2022-06-12 DIAGNOSIS — E119 Type 2 diabetes mellitus without complications: Secondary | ICD-10-CM

## 2022-06-12 DIAGNOSIS — R0789 Other chest pain: Secondary | ICD-10-CM | POA: Diagnosis not present

## 2022-06-12 DIAGNOSIS — Z9861 Coronary angioplasty status: Secondary | ICD-10-CM

## 2022-06-12 DIAGNOSIS — R198 Other specified symptoms and signs involving the digestive system and abdomen: Secondary | ICD-10-CM

## 2022-06-12 DIAGNOSIS — G4733 Obstructive sleep apnea (adult) (pediatric): Secondary | ICD-10-CM | POA: Diagnosis not present

## 2022-06-12 DIAGNOSIS — E785 Hyperlipidemia, unspecified: Secondary | ICD-10-CM

## 2022-06-12 DIAGNOSIS — I2510 Atherosclerotic heart disease of native coronary artery without angina pectoris: Secondary | ICD-10-CM

## 2022-06-12 DIAGNOSIS — D509 Iron deficiency anemia, unspecified: Secondary | ICD-10-CM | POA: Diagnosis not present

## 2022-06-12 DIAGNOSIS — Z0000 Encounter for general adult medical examination without abnormal findings: Secondary | ICD-10-CM

## 2022-06-12 DIAGNOSIS — E7841 Elevated Lipoprotein(a): Secondary | ICD-10-CM

## 2022-06-12 LAB — POCT GLYCOSYLATED HEMOGLOBIN (HGB A1C): Hemoglobin A1C: 5.9 % — AB (ref 4.0–5.6)

## 2022-06-12 LAB — GLUCOSE, CAPILLARY: Glucose-Capillary: 97 mg/dL (ref 70–99)

## 2022-06-12 MED ORDER — AMLODIPINE BESYLATE 5 MG PO TABS: 5.0000 mg | ORAL_TABLET | Freq: Every day | ORAL | 3 refills | Status: AC

## 2022-06-12 MED ORDER — NITROGLYCERIN 0.4 MG SL SUBL: 0.4000 mg | SUBLINGUAL_TABLET | SUBLINGUAL | 0 refills | Status: DC | PRN

## 2022-06-12 NOTE — Progress Notes (Signed)
66 yo [name] is here for routine f/u of chronic conditions, though she has been experiencing recent chest pain (see below).  Nephew recently passed away as well as other relatives; much grieving in the household, though she is taking care of herself.   Acute onset CP on 06/09/22 in afternoon, hadn't had lunch, was sitting, located in middle of chest, described as a sharp squeeze or grab.  Did not affect breathing.  Did not radiate, not associated with nausea.  Did develop diaphoresis.  The episode lasted < 5 minutes, eased spontaneously w/o intervention.  Episodic since then.  Did have an episode when walking to clinic this am which lasted about 30 seconds, resolved while still walking.  Doesn't happen at when lying down or when asleep.  Not associated with meals and she hasn't been eating any greasy or heavy food.  She does have considerable burping but doing so doesn't relieve the pain.  On recent EGD, Dr. Enis Gash found redness in the stomach, she reported he said esophagus was not inflamed.  GI will be seeing her again to f/u her Hg and Fe studies.  Not sleeping mch, due for CPAP fitting  Sees registered dietitian Butch Penny today for weight loss strategies.  Was referred to the physicians referral exercise program but has not been contacted yet.  Health maintenance: Eye appt - 05/21/22 Dr. [facility] Doctor at [facility] - no retinopathy, early cataracts, waiting on new eyeglasses.  Needs 2nd pneumococcal today, and zoster vaccination. Foot exam today.    Physical exam: BP 118/67 (BP Location: Left Arm, Patient Position: Sitting, Cuff Size: Normal)   Pulse 81   Temp 98.6 F (37 C) (Oral)   Ht '5\' 7"'$  (1.702 m)   Wt 226 lb (102.5 kg)   LMP 02/11/2011 Comment: no chance of pregnancy per pt  SpO2 96%   BMI 35.40 kg/m   Affect is sad, and worried.  Belches during exam, but not having the chest pain during our visit.  Lungs are clear in all fields.  Heart in 4 listening posts is RRR, murmur at base,  audible at mitral but sounds like referred sound.  No tenderness over the chest wall between the breasts which is where she was feeling the now-resolved discomfort.  Abdomen with nml percussion sounds, no tenderness in RUQ. LE's no edema, skin of feet in excellent condition, 2+ dp pulses, fully sensate.  Assessment and plan:  Atypical chest pain Features are atypical for angina.  NTG SL prn for possible esophageal spasm, though she is already on amlodipine which has some smooth muscle relaxing properties. Identifying esophageal dysmotility would be hit and miss depending on whether she was symptomatic during an esophagram.  Not ordered yet.  She will let me know how the NTG works (effectiveness doesn't differentiate between coronary and esophageal etiology, however, and given high pretest probability of CAD given her history, I will reach out to her cardiologist if NTG is helpful).    Essential hypertension Well controlled at 118/67 with addition of amlodipine 5 mg to Hyzaar 100-'25mg'$  daily and Toprol 25 mg daily.    Type 2 diabetes mellitus, controlled (HCC) A1c 5.9 today.  Continue metformin.  Foot exam normal pulses and sensation with no lesions.  Up-to-date on eye appointment 05/21/2022 with no retinopathy, very early cataracts.  Numbness and tingling in both hands Minimal bother at this time.  She is not interested in pursuing an evaluation.  See prior notes.  Obesity, Class II, BMI 35-39.9 Slowly losing  weight, today 226 pounds down from 236 pounds 1 month ago.  BMI 35.4.  She is meeting today with Butch Penny, registered dietitian.  Grief reaction with prolonged bereavement Nephew passed away recently.  Information provided for complementary grief counseling through hospice.  Gastroesophageal reflux disease Recent EGD with Dr. Havery Moros negative for esophagitis or PUD.  She continues to take PPI twice daily as he has prescribed, which relieves her heartburn.  Iron deficiency anemia EGD  negative for source of iron deficiency.  She continues to take an iron tablet once daily which will be reevaluated waited in the GI office (Hgb and FE).  Hyperlipidemia Most recent lipid panel 05/30/2022 TC 106, HDL 33, LDL 56.  Well-controlled for secondary prevention goals.  CAD S/P percutaneous coronary angioplasty 10/2017 Visit with cardiologist Dr. Debara Pickett May 30, 2022, where lipid panel was drawn.    Aortic valve stenosis He plans to continue surveillance echocardiograms for her mild aortic stenosis every 2 years, though she is asymptomatic and her murmur is stable.   Perennial allergic rhinitis Requests refill of cetrizine which has been helpful for relief of symptoms.

## 2022-06-12 NOTE — Patient Instructions (Signed)
Try to eat some protein with your fruit some of the time.   Monitor your portions of fruits( sugary foods)  and starchy foods.   Best of luck with getting used to you CPAP.  Stay with it it will help.  Butch Penny [phone]

## 2022-06-12 NOTE — Assessment & Plan Note (Addendum)
Features are atypical for angina.  NTG SL prn for possible esophageal spasm, though she is already on amlodipine which has some smooth muscle relaxing properties. Identifying esophageal dysmotility would be hit and miss depending on whether she was symptomatic during an esophagram.  Not ordered yet.  She will let me know how the NTG works (effectiveness doesn't differentiate between coronary and esophageal etiology, however, and given high pretest probability of CAD given her history, I will reach out to her cardiologist if NTG is helpful).

## 2022-06-12 NOTE — Patient Instructions (Signed)
Emma Bonilla, It was good to see you again. The chest pain you're experiencing is perplexing.  Because you have had your heart and gastroenterology evaluations so recently and all checked out ok there, you may be having spasms of the esophagus muscle.  Try the nitroglycerin under your tongue.  If it helps relieve the chest pain, this is helpful information.  If it doesn't help, please don't take any further medicine and let me know so we can pursue next steps.  Your blood pressure and diabetes control are excellent!  Keep up the good work.  See you in 6 months at the latest, earlier if needed.  Dr. Jimmye Norman

## 2022-06-12 NOTE — Progress Notes (Signed)
Medical Nutrition Therapy:  Appt start time: 1100 end time:  1200. Total time: 60 Visit # 1   Assessment:  Primary concerns today: diabetes meal planning and weight management. Physician requests goals of weight loss, healthy dietary choices for T2DM, hepatic steatosis, obesity, OA. Amorie says she has but out bread, potatoes, sweets since finding out she has diabetes. Unfortunately she is eating crackers instead of bread and stopped potatoes and peanut butter sandwiches as well. She is getting her first CPAP today and is waiting to hear from a dentist about getting dentures. She has not heard form the PREP class and is ready to start. She would like assistance in having them contact her. She is interested in self monitoring her blood sugars at home and agreed we'd discuss at her next visit.   Preferred Learning Style: No preference indicated  Learning Readiness:Ready, Change in progress  ANTHROPOMETRICS: Estimated body mass index is 35.4 kg/m as calculated from the following:   Height as of an earlier encounter on 06/12/22: '5\' 7"'$  (1.702 m).   Weight as of an earlier encounter on 06/12/22: 226 lb (102.5 kg).  WEIGHT HISTORY: Has lost 10# in last year.  Wt Readings from Last 10 Encounters:  06/12/22 226 lb (102.5 kg)  05/30/22 228 lb (103.4 kg)  05/19/22 227 lb 1.6 oz (103 kg)  05/05/22 236 lb (107 kg)  04/17/22 233 lb 4.8 oz (105.8 kg)  04/01/22 236 lb 6.4 oz (107.2 kg)  03/11/22 237 lb (107.5 kg)  01/16/22 238 lb (108 kg)  11/07/21 237 lb (107.5 kg)  07/15/21 236 lb (107 kg)    SLEEP: sleeps poorly, used to be awakened by night mare, now does not fall to sleep well or stay asleep. Wakes feeling poorly and unrested.   MEDICATIONS:  Current Outpatient Medications on File Prior to Visit  Medication Sig Dispense Refill   amLODipine (NORVASC) 5 MG tablet Take 1 tablet (5 mg total) by mouth daily. 90 tablet 3   atorvastatin (LIPITOR) 40 MG tablet TAKE 1 TABLET BY MOUTH ONCE DAILY AT 6 PM  90 tablet 3   buPROPion (WELLBUTRIN SR) 150 MG 12 hr tablet TAKE 1 TABLET ('150MG'$  TOTAL) BY MOUTH 2 TIMES A DAY 180 tablet 3   calcium-vitamin D (OSCAL WITH D) 500-200 MG-UNIT per tablet Take 1 tablet by mouth daily. 30 tablet 3   cetirizine (EQ ALLERGY RELIEF, CETIRIZINE,) 10 MG tablet Take 1 tablet (10 mg total) by mouth daily. 90 tablet 0   clopidogrel (PLAVIX) 75 MG tablet Take 1 tablet (75 mg total) by mouth daily with breakfast. 90 tablet 3   ezetimibe (ZETIA) 10 MG tablet Take 1 tablet (10 mg total) by mouth daily. 90 tablet 3   ferrous sulfate 325 (65 FE) MG tablet Take 1 tablet (325 mg total) by mouth daily. 30 tablet 3   levothyroxine (SYNTHROID) 100 MCG tablet TAKE 1 TABLET (100MCG TOTAL) BY MOUTH DAILY. 90 tablet 3   losartan-hydrochlorothiazide (HYZAAR) 100-25 MG tablet Take 1 tablet by mouth daily. 30 tablet 0   Melatonin 3 MG TABS Take 3 mg by mouth at bedtime.     metFORMIN (GLUCOPHAGE) 500 MG tablet Take 1 tablet (500 mg total) by mouth daily. 90 tablet 3   metoprolol succinate (TOPROL-XL) 25 MG 24 hr tablet Take 1 tablet (25 mg total) by mouth daily. 90 tablet 3   nitroGLYCERIN (NITROSTAT) 0.4 MG SL tablet Place 1 tablet (0.4 mg total) under the tongue every 5 (five) minutes as needed  for chest pain. 30 tablet 0   omeprazole (PRILOSEC) 40 MG capsule Take 1 capsule (40 mg total) by mouth 2 (two) times daily. 180 capsule 2   PAZEO 0.7 % SOLN PLACE 1 DROP TO EYE DAILY. 7.5 mL 0   No current facility-administered medications on file prior to visit.    BLOOD SUGAR: Lab Results  Component Value Date   HGBA1C 5.9 (A) 06/12/2022   HGBA1C 6.3 (H) 11/07/2021   HGBA1C 6.1 (A) 03/01/2021   HGBA1C 6.7 (A) 04/23/2020   HGBA1C 6.0 (A) 06/20/2019    BP Readings from Last 3 Encounters:  06/12/22 118/67  05/30/22 128/64  05/19/22 (!) 141/86    Lipid Panel     Component Value Date/Time   CHOL 106 05/30/2022 1137   TRIG 82 05/30/2022 1137   HDL 33 (L) 05/30/2022 1137   CHOLHDL  3.2 05/30/2022 1137   CHOLHDL 5.1 10/14/2017 0245   VLDL 24 10/14/2017 0245   LDLCALC 56 05/30/2022 1137   LABVLDL 17 05/30/2022 1137      Latest Ref Rng & Units 10/11/2020   11:32 AM 06/20/2019    2:18 PM 04/17/2018    5:16 AM  BMP  Glucose 65 - 99 mg/dL 96  100  130   BUN 8 - 27 mg/dL '13  10  9   '$ Creatinine 0.57 - 1.00 mg/dL 1.11  1.10  0.88   BUN/Creat Ratio 12 - '28 12  9    '$ Sodium 134 - 144 mmol/L 144  142  141   Potassium 3.5 - 5.2 mmol/L 4.3  3.7  3.5   Chloride 96 - 106 mmol/L 104  99  103   CO2 20 - 29 mmol/L '25  27  29   '$ Calcium 8.7 - 10.3 mg/dL 9.3  9.3  8.9     DIETARY INTAKE: Usual eating pattern includes 2 meals and 3 snacks per day. Everyday foods include fruit, eggs, chicken, beans, fresh fruits and vegetables.  Avoided foods include uses milk on cereal.  Food Intolerances: lactose  Dining Out (times/week): need to assess at future visit  24-hr recall: patient states intake is not the same without teeth B ( 830 AM -1200 PM): 2 eggs, 1 c homemade grits, 2 sausage patties, water  Snk ( AM): fruit- 1 plum L ( PM): fruit- 4 strawberries and a handful of grapes Snk ( PM): fruit- watermelon and another handful of grapes D ( 8 PM): 2 breaded chicken strips Snk ( PM): denies snacking when she awakens form sleep Beverages: water,diet soda and 1 regular soda every other week  Usual physical activity: "chases after 66 year old all day"   Estimated energy needs: ~1500-1800 calories/day for weight loss   Progress Towards Goal(s):  In progress.   Nutritional Diagnosis:  [place]-3.3 Overweight/obesity As related to excess calorie intake as compared to output.  As evidenced by her BMI>30.    Intervention:  Nutrition education about diabetes meal planning and weight loss, A1C, effects of sleep and inflammation on weight and health. Action Goal: eat smaller portions of fruit and have protein with carbs to balance meals  Outcome goal: increased knowledge about how to decrease her  weight Coordination of care: co cat PREP on behalf of patient  Teaching Method Utilized: Visual, Auditory,Hands on Handouts given during visit include: After visit summary, meal planing books Barriers to learning/adherence to lifestyle change: competing values Demonstrated degree of understanding via:  Teach Back   Monitoring/Evaluation:  Dietary intake, exercise, and  body weight in 2 week(s) Debera Lat, RD 06/12/2022 12:39 PM. .

## 2022-06-13 ENCOUNTER — Telehealth: Payer: Self-pay

## 2022-06-13 LAB — BMP8+ANION GAP
Anion Gap: 18 mmol/L (ref 10.0–18.0)
BUN/Creatinine Ratio: 10 — ABNORMAL LOW (ref 12–28)
BUN: 10 mg/dL (ref 8–27)
CO2: 24 mmol/L (ref 20–29)
Calcium: 9.6 mg/dL (ref 8.7–10.3)
Chloride: 101 mmol/L (ref 96–106)
Creatinine, Ser: 1.01 mg/dL — ABNORMAL HIGH (ref 0.57–1.00)
Glucose: 91 mg/dL (ref 70–99)
Potassium: 3.6 mmol/L (ref 3.5–5.2)
Sodium: 143 mmol/L (ref 134–144)
eGFR: 61 mL/min/{1.73_m2} (ref >59)

## 2022-06-13 MED ORDER — CETIRIZINE HCL 10 MG PO TABS: 10.0000 mg | ORAL_TABLET | Freq: Every day | ORAL | 0 refills | Status: DC

## 2022-06-13 NOTE — Assessment & Plan Note (Signed)
EGD negative for source of iron deficiency.  She continues to take an iron tablet once daily which will be reevaluated waited in the GI office (Hgb and FE).

## 2022-06-13 NOTE — Assessment & Plan Note (Signed)
He plans to continue surveillance echocardiograms for her mild aortic stenosis every 2 years, though she is asymptomatic and her murmur is stable.

## 2022-06-13 NOTE — Telephone Encounter (Signed)
[facility]MT pt requesting call back reference PREP referral.  

## 2022-06-13 NOTE — Assessment & Plan Note (Signed)
Recent EGD with Dr. Havery Moros negative for esophagitis or PUD.  She continues to take PPI twice daily as he has prescribed, which relieves her heartburn.

## 2022-06-13 NOTE — Assessment & Plan Note (Signed)
Slowly losing weight, today 226 pounds down from 236 pounds 1 month ago.  BMI 35.4.  She is meeting today with Butch Penny, registered dietitian.

## 2022-06-13 NOTE — Assessment & Plan Note (Signed)
Visit with cardiologist Dr. Debara Pickett May 30, 2022, where lipid panel was drawn.

## 2022-06-13 NOTE — Assessment & Plan Note (Signed)
A1c 5.9 today.  Continue metformin.  Foot exam normal pulses and sensation with no lesions.  Up-to-date on eye appointment 05/21/2022 with no retinopathy, very early cataracts.

## 2022-06-13 NOTE — Assessment & Plan Note (Signed)
Minimal bother at this time.  She is not interested in pursuing an evaluation.  See prior notes.

## 2022-06-13 NOTE — Assessment & Plan Note (Signed)
Requests refill of cetrizine which has been helpful for relief of symptoms.

## 2022-06-13 NOTE — Assessment & Plan Note (Signed)
Well controlled at 118/67 with addition of amlodipine 5 mg to Hyzaar 100-'25mg'$  daily and Toprol 25 mg daily.

## 2022-06-13 NOTE — Assessment & Plan Note (Signed)
Most recent lipid panel 05/30/2022 TC 106, HDL 33, LDL 56.  Well-controlled for secondary prevention goals.

## 2022-06-13 NOTE — Assessment & Plan Note (Signed)
Nephew passed away recently.  Information provided for complementary grief counseling through hospice.

## 2022-06-20 ENCOUNTER — Other Ambulatory Visit: Payer: Self-pay | Admitting: Internal Medicine

## 2022-06-20 DIAGNOSIS — Z780 Asymptomatic menopausal state: Secondary | ICD-10-CM

## 2022-06-20 DIAGNOSIS — Z9189 Other specified personal risk factors, not elsewhere classified: Secondary | ICD-10-CM

## 2022-06-21 ENCOUNTER — Other Ambulatory Visit: Payer: Self-pay | Admitting: Internal Medicine

## 2022-06-21 DIAGNOSIS — E66812 Obesity, class 2: Secondary | ICD-10-CM

## 2022-06-21 DIAGNOSIS — K760 Fatty (change of) liver, not elsewhere classified: Secondary | ICD-10-CM

## 2022-06-21 DIAGNOSIS — E669 Obesity, unspecified: Secondary | ICD-10-CM

## 2022-06-21 DIAGNOSIS — E119 Type 2 diabetes mellitus without complications: Secondary | ICD-10-CM

## 2022-06-26 ENCOUNTER — Encounter: Payer: Self-pay | Admitting: Dietician

## 2022-06-26 ENCOUNTER — Other Ambulatory Visit: Payer: Self-pay | Admitting: Dietician

## 2022-06-26 ENCOUNTER — Ambulatory Visit (INDEPENDENT_AMBULATORY_CARE_PROVIDER_SITE_OTHER): Payer: Medicare Other | Admitting: Dietician

## 2022-06-26 DIAGNOSIS — E119 Type 2 diabetes mellitus without complications: Secondary | ICD-10-CM

## 2022-06-26 DIAGNOSIS — Z7984 Long term (current) use of oral hypoglycemic drugs: Secondary | ICD-10-CM | POA: Diagnosis not present

## 2022-06-26 NOTE — Patient Instructions (Addendum)
Call Karl Bales [phone] to learn more about the exercise program.   Keep eating healthy foods and portions.   You did great checking your blood sugars!!!    Please continue checking as we discussed.   Bring your meter  and your log book to our next visit.  Please schedule a visit in about 4 weeks.   Butch Penny [phone]

## 2022-06-26 NOTE — Progress Notes (Addendum)
Medical Nutrition Therapy:  Appt start time: 1430 end time:  1530 Total time: 60 Visit # 2  Assessment:  Primary concerns today: diabetes meal planning and weight management. Physician requests goals of weight loss, healthy dietary choices for T2DM, hepatic steatosis, obesity, OA. Favour says she is having a hard time eating bread instead craxckers, but she has eaten protein with her starch most of the time, had smaller portions of fruits that she stated was difficult for her. He has also monitored her portions of starchy foods.  She is getting used to her new CPAP.  She states she feels much better. She has not started an exercise program. She has lost 3# in the past 2 weeks for a total loss of 15# in 5 months. Blood pressure and sugar both improving with blood pressure at target and blood sugar slightly higher than target.   ANTHROPOMETRICS: Estimated body mass index is 34.96 kg/m as calculated from the following:   Height as of 06/12/22: '5\' 7"'$  (1.702 m).   Weight as of this encounter: 223 lb 3.2 oz (101.2 kg).  WEIGHT HISTORY: Has lost 10# in last year.  Wt Readings from Last 10 Encounters:  06/26/22 223 lb 3.2 oz (101.2 kg)  06/12/22 226 lb (102.5 kg)  05/30/22 228 lb (103.4 kg)  05/19/22 227 lb 1.6 oz (103 kg)  05/05/22 236 lb (107 kg)  04/17/22 233 lb 4.8 oz (105.8 kg)  04/01/22 236 lb 6.4 oz (107.2 kg)  03/11/22 237 lb (107.5 kg)  01/16/22 238 lb (108 kg)  11/07/21 237 lb (107.5 kg)    MEDICATIONS:  Current Outpatient Medications on File Prior to Visit  Medication Sig Dispense Refill   amLODipine (NORVASC) 5 MG tablet Take 1 tablet (5 mg total) by mouth daily. 90 tablet 3   atorvastatin (LIPITOR) 40 MG tablet TAKE 1 TABLET BY MOUTH ONCE DAILY AT 6 PM 90 tablet 3   buPROPion (WELLBUTRIN SR) 150 MG 12 hr tablet TAKE 1 TABLET ('150MG'$  TOTAL) BY MOUTH 2 TIMES A DAY 180 tablet 3   calcium-vitamin D (OSCAL WITH D) 500-200 MG-UNIT per tablet Take 1 tablet by mouth daily. 30 tablet 3    cetirizine (EQ ALLERGY RELIEF, CETIRIZINE,) 10 MG tablet Take 1 tablet (10 mg total) by mouth daily. 90 tablet 0   clopidogrel (PLAVIX) 75 MG tablet Take 1 tablet (75 mg total) by mouth daily with breakfast. 90 tablet 3   ezetimibe (ZETIA) 10 MG tablet Take 1 tablet (10 mg total) by mouth daily. 90 tablet 3   ferrous sulfate 325 (65 FE) MG tablet Take 1 tablet (325 mg total) by mouth daily. 30 tablet 3   levothyroxine (SYNTHROID) 100 MCG tablet TAKE 1 TABLET (100MCG TOTAL) BY MOUTH DAILY. 90 tablet 3   losartan-hydrochlorothiazide (HYZAAR) 100-25 MG tablet Take 1 tablet by mouth daily. 30 tablet 0   Melatonin 3 MG TABS Take 3 mg by mouth at bedtime.     metFORMIN (GLUCOPHAGE) 500 MG tablet Take 1 tablet (500 mg total) by mouth daily. 90 tablet 3   metoprolol succinate (TOPROL-XL) 25 MG 24 hr tablet Take 1 tablet (25 mg total) by mouth daily. 90 tablet 3   nitroGLYCERIN (NITROSTAT) 0.4 MG SL tablet Place 1 tablet (0.4 mg total) under the tongue every 5 (five) minutes as needed for chest pain. 30 tablet 0   omeprazole (PRILOSEC) 40 MG capsule Take 1 capsule (40 mg total) by mouth 2 (two) times daily. 180 capsule 2   PAZEO  0.7 % SOLN PLACE 1 DROP TO EYE DAILY. 7.5 mL 0   No current facility-administered medications on file prior to visit.    BLOOD SUGAR: 88 mg/dL about 3 -4 hours after eating using her nwe meter Lab Results  Component Value Date   HGBA1C 5.9 (A) 06/12/2022   HGBA1C 6.3 (H) 11/07/2021   HGBA1C 6.1 (A) 03/01/2021   HGBA1C 6.7 (A) 04/23/2020   HGBA1C 6.0 (A) 06/20/2019     Lipid Panel     Component Value Date/Time   CHOL 106 05/30/2022 1137   TRIG 82 05/30/2022 1137   HDL 33 (L) 05/30/2022 1137   CHOLHDL 3.2 05/30/2022 1137   CHOLHDL 5.1 10/14/2017 0245   VLDL 24 10/14/2017 0245   LDLCALC 56 05/30/2022 1137   LABVLDL 17 05/30/2022 1137      Latest Ref Rng & Units 06/12/2022   10:56 AM 10/11/2020   11:32 AM 06/20/2019    2:18 PM  BMP  Glucose 70 - 99 mg/dL 91  96   100   BUN 8 - 27 mg/dL '10  13  10   '$ Creatinine 0.57 - 1.00 mg/dL 1.01  1.11  1.10   BUN/Creat Ratio 12 - '28 10  12  9   '$ Sodium 134 - 144 mmol/L 143  144  142   Potassium 3.5 - 5.2 mmol/L 3.6  4.3  3.7   Chloride 96 - 106 mmol/L 101  104  99   CO2 20 - 29 mmol/L '24  25  27   '$ Calcium 8.7 - 10.3 mg/dL 9.6  9.3  9.3     DIETARY INTAKE: not done in detail today Usual eating pattern includes 2 meals and 3 snacks per day.  Food Intolerances: lactose  Dining Out (times/week): need to assess at future visit  Estimated energy needs: ~1500-1800 calories/day for weight loss   Progress Towards Goal(s):  In progress.   Nutritional Diagnosis:  [place]-3.3 Overweight/obesity As related to excess calorie intake as compared to output.  As evidenced by her BMI>30.    Intervention:  Nutrition education about diabetes meal planning and weight loss, self monitoring blood glucose per her request. . Action Goal: eat smaller portions of fruit and have protein with carbs to balance meals  Outcome goal: increased knowledge about how to decrease her weight and about diabetes, goal weight is BMI< 30 Coordination of care: request testing supplies. Gave her PREP contact number  Teaching Method Utilized: Visual, Auditory,Hands on Handouts given during visit include: After visit summary, meal planing books Barriers to learning/adherence to lifestyle change: competing values Demonstrated degree of understanding via:  Teach Back   Monitoring/Evaluation:  Dietary intake, exercise, and body weight in 4 week(s) Debera Lat, RD 06/26/2022 5:12 PM. .

## 2022-06-26 NOTE — Telephone Encounter (Signed)
Emma Bonilla requested to learn how to check her blood sugars today. A sample Accu check guide was provided to her today. She request supplies

## 2022-06-27 ENCOUNTER — Telehealth: Payer: Self-pay

## 2022-06-27 MED ORDER — ACCU-CHEK SOFTCLIX LANCETS MISC: 3 refills | Status: DC

## 2022-06-27 MED ORDER — ACCU-CHEK GUIDE VI STRP: ORAL_STRIP | 3 refills | Status: DC

## 2022-06-27 NOTE — Telephone Encounter (Signed)
Call to pt reference PREP referral Explained program to pt Is interested in participating. Would like a nighttime class. Anticipating starting the next one in late Sept Will call her back to schedule

## 2022-06-30 ENCOUNTER — Telehealth: Payer: Medicare Other | Admitting: Nurse Practitioner

## 2022-06-30 ENCOUNTER — Encounter: Payer: Self-pay | Admitting: Nurse Practitioner

## 2022-06-30 DIAGNOSIS — U071 COVID-19: Secondary | ICD-10-CM

## 2022-06-30 MED ORDER — MOLNUPIRAVIR EUA 200MG CAPSULE: 4.0000 | ORAL_CAPSULE | Freq: Two times a day (BID) | ORAL | 0 refills | Status: AC

## 2022-06-30 NOTE — Progress Notes (Signed)
Virtual Visit Consent   Emma Bonilla, you are scheduled for a virtual visit with a [facility] provider today. Just as with appointments in the office, your consent must be obtained to participate. Your consent will be active for this visit and any virtual visit you may have with one of our providers in the next 365 days. If you have a MyChart account, a copy of this consent can be sent to you electronically.  As this is a virtual visit, video technology does not allow for your provider to perform a traditional examination. This may limit your provider's ability to fully assess your condition. If your provider identifies any concerns that need to be evaluated in person or the need to arrange testing (such as labs, EKG, etc.), we will make arrangements to do so. Although advances in technology are sophisticated, we cannot ensure that it will always work on either your end or our end. If the connection with a video visit is poor, the visit may have to be switched to a telephone visit. With either a video or telephone visit, we are not always able to ensure that we have a secure connection.  By engaging in this virtual visit, you consent to the provision of healthcare and authorize for your insurance to be billed (if applicable) for the services provided during this visit. Depending on your insurance coverage, you may receive a charge related to this service.  I need to obtain your verbal consent now. Are you willing to proceed with your visit today? Emma Bonilla has provided verbal consent on 06/30/2022 for a virtual visit (video or telephone). Apolonio Schneiders, FNP  Date: 06/30/2022 10:42 AM  Virtual Visit via Video Note   I, Apolonio Schneiders, connected with  Emma Bonilla  ([id number], Jan 05, 1956) on 06/30/22 at 10:45 AM EDT by a video-enabled telemedicine application and verified that I am speaking with the correct person using two identifiers.  Location: Patient: Virtual Visit Location Patient:  Home Provider: Virtual Visit Location Provider: Home Office   I discussed the limitations of evaluation and management by telemedicine and the availability of in person appointments. The patient expressed understanding and agreed to proceed.    History of Present Illness: Emma Bonilla is a 66 y.o. who identifies as a female who was assigned female at birth, and is being seen today after testing positive for COVID-19 at home.   Symptom onset: 3 days ago   Symptoms include: headache, nasal congestion, slight cough    Exposure: young child she was watching   Past COVID infection: never   History of vaccine: yes x2     Problems:  Patient Active Problem List   Diagnosis Date Noted   Irregular bowel habits 06/12/2022   Preventive measure 05/19/2022   Type 2 diabetes mellitus, controlled ([place]) 04/30/2022   Family history of rectal cancer 01/16/2022   Chronic insomnia 01/16/2022   Hepatic steatosis 04/30/2021   Hx of adenomatous colonic polyps 04/18/2021   Patellar subluxation 03/03/2021   Numbness and tingling in both hands 04/23/2020   Obstructive sleep apnea hypopnea, moderate 06/20/2019   Atypical chest pain 04/17/2018   Obesity, Class II, BMI 35-39.9 10/13/2017   CAD S/P percutaneous coronary angioplasty 10/2017 10/12/2017   Iron deficiency anemia 03/27/2017   Lipoma 02/14/2017   Internal hemorrhoids 02/13/2017   TIA (transient ischemic attack) 08/21/2015   Aortic valve stenosis 06/11/2015   Dental caries 05/09/2013   Perennial allergic rhinitis 04/03/2012   Hypothyroidism 03/30/2012  Osteoarthritis 11/07/2011   Antiplatelet or antithrombotic long-term use 09/01/2011   Essential hypertension 07/25/2011   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   Gastroesophageal reflux disease 07/25/2011   Tobacco abuse 07/25/2011   Hyperlipidemia 07/25/2011    Allergies:  Allergies  Allergen Reactions   Naproxen Sodium Rash   Medications:  Current Outpatient  Medications:    Accu-Chek Softclix Lancets lancets, Check blood sugar up to 7 times per week, Disp: 100 each, Rfl: 3   amLODipine (NORVASC) 5 MG tablet, Take 1 tablet (5 mg total) by mouth daily., Disp: 90 tablet, Rfl: 3   atorvastatin (LIPITOR) 40 MG tablet, TAKE 1 TABLET BY MOUTH ONCE DAILY AT 6 PM, Disp: 90 tablet, Rfl: 3   buPROPion (WELLBUTRIN SR) 150 MG 12 hr tablet, TAKE 1 TABLET ('150MG'$  TOTAL) BY MOUTH 2 TIMES A DAY, Disp: 180 tablet, Rfl: 3   calcium-vitamin D (OSCAL WITH D) 500-200 MG-UNIT per tablet, Take 1 tablet by mouth daily., Disp: 30 tablet, Rfl: 3   cetirizine (EQ ALLERGY RELIEF, CETIRIZINE,) 10 MG tablet, Take 1 tablet (10 mg total) by mouth daily., Disp: 90 tablet, Rfl: 0   clopidogrel (PLAVIX) 75 MG tablet, Take 1 tablet (75 mg total) by mouth daily with breakfast., Disp: 90 tablet, Rfl: 3   ezetimibe (ZETIA) 10 MG tablet, Take 1 tablet (10 mg total) by mouth daily., Disp: 90 tablet, Rfl: 3   ferrous sulfate 325 (65 FE) MG tablet, Take 1 tablet (325 mg total) by mouth daily., Disp: 30 tablet, Rfl: 3   glucose blood (ACCU-CHEK GUIDE) test strip, Check blood sugar up to 7 times per week, Disp: 100 each, Rfl: 3   levothyroxine (SYNTHROID) 100 MCG tablet, TAKE 1 TABLET (100MCG TOTAL) BY MOUTH DAILY., Disp: 90 tablet, Rfl: 3   losartan-hydrochlorothiazide (HYZAAR) 100-25 MG tablet, Take 1 tablet by mouth daily., Disp: 30 tablet, Rfl: 0   Melatonin 3 MG TABS, Take 3 mg by mouth at bedtime., Disp: , Rfl:    metFORMIN (GLUCOPHAGE) 500 MG tablet, Take 1 tablet (500 mg total) by mouth daily., Disp: 90 tablet, Rfl: 3   metoprolol succinate (TOPROL-XL) 25 MG 24 hr tablet, Take 1 tablet (25 mg total) by mouth daily., Disp: 90 tablet, Rfl: 3   nitroGLYCERIN (NITROSTAT) 0.4 MG SL tablet, Place 1 tablet (0.4 mg total) under the tongue every 5 (five) minutes as needed for chest pain., Disp: 30 tablet, Rfl: 0   omeprazole (PRILOSEC) 40 MG capsule, Take 1 capsule (40 mg total) by mouth 2 (two)  times daily., Disp: 180 capsule, Rfl: 2   PAZEO 0.7 % SOLN, PLACE 1 DROP TO EYE DAILY., Disp: 7.5 mL, Rfl: 0  Observations/Objective: Patient is well-developed, well-nourished in no acute distress.  Resting comfortably  at home.  Head is normocephalic, atraumatic.  No labored breathing.  Speech is clear and coherent with logical content.  Patient is alert and oriented at baseline.    Assessment and Plan: 1. COVID-19 Take with food   - molnupiravir EUA (LAGEVRIO) 200 mg CAPS capsule; Take 4 capsules (800 mg total) by mouth 2 (two) times daily for 5 days.  Dispense: 40 capsule; Refill: 0    Continue to manage symptoms with over the counter medication as discussed, tylenol for headache.  Follow Up Instructions: I discussed the assessment and treatment plan with the patient. The patient was provided an opportunity to ask questions and all were answered. The patient agreed with the plan and demonstrated an understanding of the instructions.  A copy of instructions were sent to the patient via MyChart unless otherwise noted below.   The patient was advised to call back or seek an in-person evaluation if the symptoms worsen or if the condition fails to improve as anticipated.  Time:  I spent 15 minutes with the patient via telehealth technology discussing the above problems/concerns.    Apolonio Schneiders, FNP

## 2022-07-09 ENCOUNTER — Other Ambulatory Visit: Payer: Self-pay | Admitting: *Deleted

## 2022-07-09 DIAGNOSIS — K219 Gastro-esophageal reflux disease without esophagitis: Secondary | ICD-10-CM

## 2022-07-11 ENCOUNTER — Telehealth: Payer: Self-pay

## 2022-07-11 NOTE — Telephone Encounter (Signed)
LVMT pt requesting call back reference PREP class starting on 07/22/22

## 2022-07-13 DIAGNOSIS — G4733 Obstructive sleep apnea (adult) (pediatric): Secondary | ICD-10-CM | POA: Diagnosis not present

## 2022-07-14 MED ORDER — OMEPRAZOLE 40 MG PO CPDR: 40.0000 mg | DELAYED_RELEASE_CAPSULE | Freq: Two times a day (BID) | ORAL | 3 refills | Status: AC

## 2022-07-15 ENCOUNTER — Inpatient Hospital Stay: Admission: RE | Admit: 2022-07-15 | Payer: Medicare Other | Source: Ambulatory Visit

## 2022-07-17 ENCOUNTER — Ambulatory Visit
Admission: RE | Admit: 2022-07-17 | Discharge: 2022-07-17 | Disposition: A | Discharge status: Home / Self Care | Payer: Medicare Other | Source: Ambulatory Visit | Attending: Internal Medicine | Admitting: Internal Medicine

## 2022-07-17 DIAGNOSIS — Z1231 Encounter for screening mammogram for malignant neoplasm of breast: Secondary | ICD-10-CM

## 2022-08-05 ENCOUNTER — Encounter: Payer: Self-pay | Admitting: Dietician

## 2022-08-05 ENCOUNTER — Ambulatory Visit (INDEPENDENT_AMBULATORY_CARE_PROVIDER_SITE_OTHER): Payer: Medicare Other | Admitting: Dietician

## 2022-08-05 DIAGNOSIS — E119 Type 2 diabetes mellitus without complications: Secondary | ICD-10-CM | POA: Diagnosis not present

## 2022-08-05 NOTE — Progress Notes (Signed)
Medical Nutrition Therapy:  Appt start time: 4496 end time:  1455 Total time: 35 Visit # 3  Assessment:  Primary concerns today: diabetes meal planning and weight management. Physician requests goals of weight loss, healthy dietary choices for T2DM, hepatic steatosis, obesity, OA.   Emma Bonilla says she is decreasing her portions and eating more often. She has several plans for increasing her physical activity, but has not started any of them yet. Marland Kitchen   Her weight continues to decline.   She has lost 7# in the past 5 weeks for a total loss of 22# in 5 months. Blood pressure and sugar both improving.    ANTHROPOMETRICS: Estimated body mass index is 33.97 kg/m as calculated from the following:   Height as of 06/12/22: '5\' 7"'$  (1.702 m).   Weight as of this encounter: 216 lb 14.4 oz (98.4 kg).  WEIGHT HISTORY: Has lost 10# in last year.  Wt Readings from Last 10 Encounters:  08/05/22 216 lb 14.4 oz (98.4 kg)  06/26/22 223 lb 3.2 oz (101.2 kg)  06/12/22 226 lb (102.5 kg)  05/30/22 228 lb (103.4 kg)  05/19/22 227 lb 1.6 oz (103 kg)  05/05/22 236 lb (107 kg)  04/17/22 233 lb 4.8 oz (105.8 kg)  04/01/22 236 lb 6.4 oz (107.2 kg)  03/11/22 237 lb (107.5 kg)  01/16/22 238 lb (108 kg)    MEDICATIONS:  Current Outpatient Medications on File Prior to Visit  Medication Sig Dispense Refill   Accu-Chek Softclix Lancets lancets Check blood sugar up to 7 times per week 100 each 3   amLODipine (NORVASC) 5 MG tablet Take 1 tablet (5 mg total) by mouth daily. 90 tablet 3   atorvastatin (LIPITOR) 40 MG tablet TAKE 1 TABLET BY MOUTH ONCE DAILY AT 6 PM 90 tablet 3   buPROPion (WELLBUTRIN SR) 150 MG 12 hr tablet TAKE 1 TABLET ('150MG'$  TOTAL) BY MOUTH 2 TIMES A DAY 180 tablet 3   calcium-vitamin D (OSCAL WITH D) 500-200 MG-UNIT per tablet Take 1 tablet by mouth daily. 30 tablet 3   cetirizine (EQ ALLERGY RELIEF, CETIRIZINE,) 10 MG tablet Take 1 tablet (10 mg total) by mouth daily. 90 tablet 0   clopidogrel (PLAVIX)  75 MG tablet Take 1 tablet (75 mg total) by mouth daily with breakfast. 90 tablet 3   ezetimibe (ZETIA) 10 MG tablet Take 1 tablet (10 mg total) by mouth daily. 90 tablet 3   ferrous sulfate 325 (65 FE) MG tablet Take 1 tablet (325 mg total) by mouth daily. 30 tablet 3   glucose blood (ACCU-CHEK GUIDE) test strip Check blood sugar up to 7 times per week 100 each 3   levothyroxine (SYNTHROID) 100 MCG tablet TAKE 1 TABLET (100MCG TOTAL) BY MOUTH DAILY. 90 tablet 3   losartan-hydrochlorothiazide (HYZAAR) 100-25 MG tablet Take 1 tablet by mouth daily. 30 tablet 0   Melatonin 3 MG TABS Take 3 mg by mouth at bedtime.     metFORMIN (GLUCOPHAGE) 500 MG tablet Take 1 tablet (500 mg total) by mouth daily. 90 tablet 3   metoprolol succinate (TOPROL-XL) 25 MG 24 hr tablet Take 1 tablet (25 mg total) by mouth daily. 90 tablet 3   nitroGLYCERIN (NITROSTAT) 0.4 MG SL tablet Place 1 tablet (0.4 mg total) under the tongue every 5 (five) minutes as needed for chest pain. 30 tablet 0   omeprazole (PRILOSEC) 40 MG capsule Take 1 capsule (40 mg total) by mouth 2 (two) times daily. 180 capsule 3  PAZEO 0.7 % SOLN PLACE 1 DROP TO EYE DAILY. 7.5 mL 0   No current facility-administered medications on file prior to visit.    BLOOD SUGAR: before meals : 94/86/94/126/104/86/88, after meals 176/185   DIETARY INTAKE:  Usual eating pattern includes 2 meals and 3 snacks per day.  Food Intolerances: lactose  Dining Out (times/week): once wekly 24-hr recall  (Up at  AM) 645 am B ( AM)- cereal and milk or omelet cheese with toast  Snk ( AM)-pineapple  L: peanut butter sandwich or crackers x 6  Snk ( PM)- a few cubes of pineapple D ( PM)- baked beans, corn,  [place] with mayo, 2 chicken wings, koolaid Snk ( PM)- 3 cubes of pineapple  Typical day? Yes.     Estimated energy needs: ~1500-1800 calories/day for weight loss   Progress Towards Goal(s):  In progress.   Nutritional Diagnosis:  [place]-3.3 Overweight/obesity  As related to excess calorie intake as compared to output.  As evidenced by her BMI>30.    Intervention:  Nutrition education about diabetes meal planning and weight loss, hyperglycemia, foot care Action Goal: eat smaller portions of fruit and have protein with carbs to balance meals  Outcome goal: increased knowledge about how to decrease her weight and about diabetes, goal weight is BMI< 30 Coordination of care: none  Teaching Method Utilized: Visual, Auditory,Hands on Handouts given during visit include: After visit summary, meal planing books Barriers to learning/adherence to lifestyle change: competing values Demonstrated degree of understanding via:  Teach Back   Monitoring/Evaluation:  Dietary intake, exercise, and body weight in 8 week(s) Debera Lat, RD 08/05/2022 5:11 PM. .

## 2022-08-05 NOTE — Patient Instructions (Signed)
Think about who supports your efforts to take care of your health.   You are doing a great job caring for your diabetes.   Eat some starch twice a week and make it whole grain if possible.   Keep eating fruits and veggie daily.   Please make a follow up in about 8 weeks.   Emma Bonilla [phone]

## 2022-08-13 DIAGNOSIS — G4733 Obstructive sleep apnea (adult) (pediatric): Secondary | ICD-10-CM | POA: Diagnosis not present

## 2022-09-03 ENCOUNTER — Other Ambulatory Visit: Payer: Self-pay

## 2022-09-03 DIAGNOSIS — J3089 Other allergic rhinitis: Secondary | ICD-10-CM

## 2022-09-03 MED ORDER — CETIRIZINE HCL 10 MG PO TABS: 10.0000 mg | ORAL_TABLET | Freq: Every day | ORAL | 0 refills | Status: DC

## 2022-09-08 ENCOUNTER — Other Ambulatory Visit: Payer: Self-pay | Admitting: Internal Medicine

## 2022-09-08 DIAGNOSIS — J3089 Other allergic rhinitis: Secondary | ICD-10-CM

## 2022-09-08 MED ORDER — CETIRIZINE HCL 10 MG PO TABS: 10.0000 mg | ORAL_TABLET | Freq: Every day | ORAL | 3 refills | Status: AC

## 2022-09-08 NOTE — Telephone Encounter (Signed)
[facility] please send medication to correct [place], [place]

## 2022-09-09 ENCOUNTER — Telehealth: Payer: Self-pay

## 2022-09-09 NOTE — Telephone Encounter (Signed)
Call to pt reference PREP referral and class starting 10/17 6p-715p T/TH x 12 wks at [place] do that date and times. Intake scheduled for 10/16 at 5pm

## 2022-09-11 DIAGNOSIS — G4733 Obstructive sleep apnea (adult) (pediatric): Secondary | ICD-10-CM | POA: Diagnosis not present

## 2022-09-15 NOTE — Progress Notes (Signed)
[facility] PREP Evaluation  Patient Details  Name: Emma Bonilla MRN: [id number] Date of Birth: 1956-08-16 Age: 66 y.o. PCP: Angelica Pou, MD  Vitals:   09/15/22 1712  BP: 110/64  Pulse: 68  SpO2: 97%  Weight: 213 lb 3.2 oz (96.7 kg)     [facility] Eval - 09/15/22 1700       [facility] "PREP" Location   [facility] "PREP" Location [facility]      Referral    Referring Provider Williams    Reason for referral High Cholesterol;Hypertension;Inactivity   prediabetes   Program Start Date 09/16/22   T/TH 6p-715p x 12wks     Measurement   Waist Circumference 46 inches    Hip Circumference 48 inches    Body fat 43.3 percent      Information for Trainer   Goals Lose weight, goal wt 170, 24 lbs for class, lower chol, exercise    Current Exercise none    Orthopedic Concerns R hip, knee and ankle give way    Pertinent Medical History HTN, pre diabetes, inc chol, CAD with 2 stents    Current Barriers none    Restrictions/Precautions Diabetic snack before exercise    Medications that affect exercise Medication causing dizziness/drowsiness      Timed Up and Go (TUGS)   Timed Up and Go Low risk <9 seconds   no falls, walks without aide     Mobility and Daily Activities   I find it easy to walk up or down two or more flights of stairs. 4    I have no trouble taking out the trash. 4    I do housework such as vacuuming and dusting on my own without difficulty. 4    I can easily lift a gallon of milk (8lbs). 4    I can easily walk a mile. 1    I have no trouble reaching into high cupboards or reaching down to pick up something from the floor. 4    I do not have trouble doing out-door work such as Armed forces logistics/support/administrative officer, raking leaves, or gardening. 4      Mobility and Daily Activities   I feel younger than my age. 2    I feel independent. 4    I feel energetic. 1    I live an active life.  2    I feel strong. 3    I feel healthy. 4    I feel active as other people my age. 1      How fit and  strong are you.   Fit and Strong Total Score 42            Past Medical History:  Diagnosis Date   Anemia    Arthritis    Depression    Diastolic dysfunction without heart failure    Family history of rectal cancer    GERD (gastroesophageal reflux disease)    Hepatic steatosis    a. seen on prior US.   Hyperlipidemia    Hypertension    Mild aortic regurgitation    Mild aortic stenosis    Premature atrial contractions    PVC's (premature ventricular contractions)    Thyroid disease    TIA (transient ischemic attack)    2010   Tobacco abuse    Past Surgical History:  Procedure Laterality Date   CORONARY STENT INTERVENTION N/A 10/13/2017   Procedure: CORONARY STENT INTERVENTION;  Surgeon: Leonie Man, MD;  Location: [facility];  Service: Cardiovascular;  Laterality: N/A;   DILATION AND CURETTAGE OF UTERUS     30 years ago   FOOT SURGERY     LEFT HEART CATH AND CORONARY ANGIOGRAPHY N/A 10/13/2017   Procedure: LEFT HEART CATH AND CORONARY ANGIOGRAPHY;  Surgeon: Leonie Man, MD;  Location: [facility];  Service: Cardiovascular;  Laterality: N/A;   Social History   Tobacco Use  Smoking Status Light Smoker   Packs/day: 0.50   Years: 45.00   Total pack years: 22.50   Types: Cigarettes  Smokeless Tobacco Never  Tobacco Comments   3-4 cigs per day    Barnett Hatter 09/15/2022, 5:16 PM

## 2022-09-19 DIAGNOSIS — Z23 Encounter for immunization: Secondary | ICD-10-CM | POA: Diagnosis not present

## 2022-09-19 DIAGNOSIS — D649 Anemia, unspecified: Secondary | ICD-10-CM | POA: Diagnosis not present

## 2022-09-19 DIAGNOSIS — Z8673 Personal history of transient ischemic attack (TIA), and cerebral infarction without residual deficits: Secondary | ICD-10-CM | POA: Diagnosis not present

## 2022-09-19 DIAGNOSIS — R519 Headache, unspecified: Secondary | ICD-10-CM | POA: Diagnosis not present

## 2022-09-19 DIAGNOSIS — F17210 Nicotine dependence, cigarettes, uncomplicated: Secondary | ICD-10-CM | POA: Diagnosis not present

## 2022-09-19 DIAGNOSIS — I700 Atherosclerosis of aorta: Secondary | ICD-10-CM | POA: Diagnosis not present

## 2022-09-19 DIAGNOSIS — J439 Emphysema, unspecified: Secondary | ICD-10-CM | POA: Diagnosis not present

## 2022-09-19 DIAGNOSIS — I509 Heart failure, unspecified: Secondary | ICD-10-CM | POA: Diagnosis not present

## 2022-09-25 DIAGNOSIS — F17210 Nicotine dependence, cigarettes, uncomplicated: Secondary | ICD-10-CM | POA: Diagnosis not present

## 2022-09-25 DIAGNOSIS — K625 Hemorrhage of anus and rectum: Secondary | ICD-10-CM | POA: Diagnosis not present

## 2022-09-25 DIAGNOSIS — I700 Atherosclerosis of aorta: Secondary | ICD-10-CM | POA: Diagnosis not present

## 2022-09-25 DIAGNOSIS — E785 Hyperlipidemia, unspecified: Secondary | ICD-10-CM | POA: Diagnosis not present

## 2022-09-25 DIAGNOSIS — I509 Heart failure, unspecified: Secondary | ICD-10-CM | POA: Diagnosis not present

## 2022-09-25 DIAGNOSIS — K922 Gastrointestinal hemorrhage, unspecified: Secondary | ICD-10-CM | POA: Diagnosis not present

## 2022-09-25 DIAGNOSIS — I10 Essential (primary) hypertension: Secondary | ICD-10-CM | POA: Diagnosis not present

## 2022-09-25 DIAGNOSIS — N183 Chronic kidney disease, stage 3 unspecified: Secondary | ICD-10-CM | POA: Diagnosis not present

## 2022-09-25 DIAGNOSIS — J439 Emphysema, unspecified: Secondary | ICD-10-CM | POA: Diagnosis not present

## 2022-09-26 NOTE — Progress Notes (Signed)
[facility] PREP Weekly Session  Patient Details  Name: Emma Bonilla MRN: [id number] Date of Birth: [birth year] Age: 66 y.o. PCP: Angelica Pou, MD  Vitals:   09/23/22 1800  Weight: 217 lb (98.4 kg)     [facility] Weekly seesion - 09/26/22 1400       [facility] "PREP" Location   [facility] "PREP" [facility]      Weekly Session   Topic Discussed Importance of resistance training;Other ways to be active    Minutes exercised this week 70 minutes   5 hrs of walking   Classes attended to date [place] 09/26/2022, 2:08 PM

## 2022-10-02 ENCOUNTER — Other Ambulatory Visit: Payer: Self-pay | Admitting: Family Medicine

## 2022-10-02 DIAGNOSIS — Z1231 Encounter for screening mammogram for malignant neoplasm of breast: Secondary | ICD-10-CM

## 2022-10-07 ENCOUNTER — Encounter: Payer: Medicare Other | Admitting: Dietician

## 2022-10-15 NOTE — Progress Notes (Signed)
[facility] PREP Weekly Session  Patient Details  Name: Emma Bonilla MRN: [id number] Date of Birth: 1956/10/15 Age: 66 y.o. PCP: Angelica Pou, MD  Vitals:   10/07/22 1800  Weight: 218 lb (98.9 kg)     [facility] Weekly seesion - 10/15/22 1200       [facility] "PREP" Location   [facility] "PREP" [facility]      Weekly Session   Topic Discussed Health habits    Minutes exercised this week 275 minutes    Classes attended to date 5            Late entry Barnett Hatter 10/15/2022, 12:23 PM

## 2022-10-30 NOTE — Progress Notes (Signed)
[facility] PREP Weekly Session  Patient Details  Name: Emma Bonilla MRN: [id number] Date of Birth: 1956-11-11 Age: 66 y.o. PCP: Angelica Pou, MD  Vitals:   10/28/22 1800  Weight: 217 lb 3.2 oz (98.5 kg)     [facility] Weekly seesion - 10/30/22 1700       [facility] "PREP" Location   [facility] "PREP" Location [facility]      Weekly Session   Topic Discussed Expectations and non-scale victories    Minutes exercised this week 280 minutes    Classes attended to date [place] 10/30/2022, 5:07 PM

## 2022-11-10 ENCOUNTER — Telehealth: Payer: Self-pay

## 2022-11-10 DIAGNOSIS — E611 Iron deficiency: Secondary | ICD-10-CM

## 2022-11-10 DIAGNOSIS — Z7902 Long term (current) use of antithrombotics/antiplatelets: Secondary | ICD-10-CM

## 2022-11-10 DIAGNOSIS — D508 Other iron deficiency anemias: Secondary | ICD-10-CM

## 2022-11-10 NOTE — Telephone Encounter (Signed)
-----   Message from Yevette Edwards, RN sent at 05/08/2022  4:13 PM EDT ----- Regarding: Labs CBC, IBC + Ferritin- need to enter orders

## 2022-11-10 NOTE — Telephone Encounter (Signed)
Lab orders in epic. MyChart message sent to patient with lab reminder. 

## 2022-11-11 NOTE — Telephone Encounter (Signed)
Attempted to reach patient by phone at her listed #. Line rings and then goes to fast busy signal.

## 2022-11-17 NOTE — Telephone Encounter (Signed)
Attempted to reach patient by phone again. The line rings and then goes to a fast busy signal. Will mail lab reminder to patient.

## 2022-11-19 ENCOUNTER — Other Ambulatory Visit: Payer: Self-pay | Admitting: Family Medicine

## 2022-11-19 ENCOUNTER — Ambulatory Visit
Admission: RE | Admit: 2022-11-19 | Discharge: 2022-11-19 | Disposition: A | Discharge status: Home / Self Care | Payer: Medicare Other | Source: Ambulatory Visit | Attending: Family Medicine | Admitting: Family Medicine

## 2022-11-19 DIAGNOSIS — M79644 Pain in right finger(s): Secondary | ICD-10-CM

## 2022-12-16 ENCOUNTER — Inpatient Hospital Stay: Admission: RE | Admit: 2022-12-16 | Payer: 59 | Source: Ambulatory Visit

## 2023-01-01 NOTE — Progress Notes (Signed)
Attended 8 sessions total of PREP

## 2023-01-26 ENCOUNTER — Other Ambulatory Visit: Payer: Self-pay

## 2023-01-26 DIAGNOSIS — I10 Essential (primary) hypertension: Secondary | ICD-10-CM

## 2023-01-26 MED ORDER — METOPROLOL SUCCINATE ER 25 MG PO TB24: 25.0000 mg | ORAL_TABLET | Freq: Every day | ORAL | 3 refills | Status: DC

## 2023-02-16 ENCOUNTER — Other Ambulatory Visit: Payer: Self-pay

## 2023-02-16 MED ORDER — LEVOTHYROXINE SODIUM 100 MCG PO TABS: ORAL_TABLET | ORAL | 3 refills | Status: DC

## 2023-02-16 MED ORDER — LOSARTAN POTASSIUM-HCTZ 100-25 MG PO TABS: 1.0000 | ORAL_TABLET | Freq: Every day | ORAL | 3 refills | Status: AC

## 2023-03-04 ENCOUNTER — Other Ambulatory Visit: Payer: Self-pay

## 2023-03-04 DIAGNOSIS — I10 Essential (primary) hypertension: Secondary | ICD-10-CM

## 2023-03-04 MED ORDER — METOPROLOL SUCCINATE ER 25 MG PO TB24: 25.0000 mg | ORAL_TABLET | Freq: Every day | ORAL | 3 refills | Status: AC

## 2023-03-18 ENCOUNTER — Other Ambulatory Visit: Payer: Self-pay | Admitting: *Deleted

## 2023-03-18 MED ORDER — LEVOTHYROXINE SODIUM 100 MCG PO TABS: ORAL_TABLET | ORAL | 0 refills | Status: AC

## 2023-03-27 ENCOUNTER — Other Ambulatory Visit: Payer: Self-pay | Admitting: Internal Medicine

## 2023-03-27 DIAGNOSIS — Z780 Asymptomatic menopausal state: Secondary | ICD-10-CM

## 2023-03-31 LAB — LAB REPORT - SCANNED
Albumin, Urine POC: 0.4
Albumin/Creatinine Ratio, Urine, POC: 3
Creatinine, POC: 145 mg/dL
EGFR: 54

## 2023-04-02 ENCOUNTER — Ambulatory Visit (HOSPITAL_COMMUNITY): Payer: 59 | Attending: Cardiovascular Disease

## 2023-04-02 DIAGNOSIS — I350 Nonrheumatic aortic (valve) stenosis: Secondary | ICD-10-CM | POA: Diagnosis present

## 2023-04-02 LAB — ECHOCARDIOGRAM COMPLETE
AR max vel: 1.37 cm2
AV Area VTI: 1.49 cm2
AV Area mean vel: 1.45 cm2
AV Mean grad: 18 mmHg
AV Peak grad: 33.4 mmHg
Ao pk vel: 2.89 m/s
Area-P 1/2: 3.05 cm2
P 1/2 time: 543 msec
S' Lateral: 3 cm

## 2023-04-08 ENCOUNTER — Other Ambulatory Visit: Payer: Self-pay | Admitting: Family Medicine

## 2023-04-08 DIAGNOSIS — F17210 Nicotine dependence, cigarettes, uncomplicated: Secondary | ICD-10-CM

## 2023-04-23 ENCOUNTER — Other Ambulatory Visit: Payer: Self-pay | Admitting: *Deleted

## 2023-04-23 DIAGNOSIS — I350 Nonrheumatic aortic (valve) stenosis: Secondary | ICD-10-CM

## 2023-04-30 ENCOUNTER — Telehealth: Payer: Self-pay | Admitting: Internal Medicine

## 2023-04-30 NOTE — Telephone Encounter (Signed)
Chrystie Nose, MD  Lindell Spar, RN Monitor showed brief SVT- she is listed to be metoprolol - if she is having symptomatic palpitations, PCP may want to increase that.  Dr. Rexene Edison       Previous Messages    ----- Message ----- From: Lindell Spar, RN Sent: 04/28/2023  11:02 AM EDT To: Chrystie Nose, MD  Please review. When I called her about echo results, she asked me about her monitor. Said PCP wanted to start her on a new med, but didn't give me details. Thanks ----- Message ----- From: Madilyn Hook Sent: 04/28/2023  10:55 AM EDT To: Chrystie Nose, MD; Lindell Spar, RN

## 2023-04-30 NOTE — Telephone Encounter (Signed)
Left message to call back to discuss monitor from PCP - under media

## 2023-05-11 ENCOUNTER — Other Ambulatory Visit: Payer: Self-pay | Admitting: General Practice

## 2023-05-14 ENCOUNTER — Ambulatory Visit
Admission: RE | Admit: 2023-05-14 | Discharge: 2023-05-14 | Disposition: A | Discharge status: Home / Self Care | Payer: 59 | Source: Ambulatory Visit | Attending: Family Medicine | Admitting: Family Medicine

## 2023-05-14 DIAGNOSIS — F17210 Nicotine dependence, cigarettes, uncomplicated: Secondary | ICD-10-CM

## 2023-05-15 NOTE — Telephone Encounter (Signed)
Attempted to call patient  "Call cannot be completed at this time message received" 

## 2023-05-28 NOTE — Progress Notes (Unsigned)
Cardiology Clinic Note   Patient Name: Emma Bonilla Date of Encounter: 06/01/2023  Primary Care Provider:  Sharmon Revere, MD Primary Cardiologist:  Chrystie Nose, MD  Patient Profile    Emma Bonilla 67 y.o. y/o female with a h/o coronary artery disease, hypertension, hyperlipidemia, GERD, who presents to the clinic today for follow-up evaluation.   Past Medical History    Past Medical History:  Diagnosis Date   Anemia    Arthritis    Depression    Diastolic dysfunction without heart failure    Family history of rectal cancer    GERD (gastroesophageal reflux disease)    Hepatic steatosis    a. seen on prior US.   Hyperlipidemia    Hypertension    Mild aortic regurgitation    Mild aortic stenosis    Premature atrial contractions    PVC's (premature ventricular contractions)    Thyroid disease    TIA (transient ischemic attack)    2010   Tobacco abuse    Past Surgical History:  Procedure Laterality Date   CORONARY STENT INTERVENTION N/A 10/13/2017   Procedure: CORONARY STENT INTERVENTION;  Surgeon: Marykay Lex, MD;  Location: [facility] INVASIVE CV LAB;  Service: Cardiovascular;  Laterality: N/A;   DILATION AND CURETTAGE OF UTERUS     30 years ago   FOOT SURGERY     LEFT HEART CATH AND CORONARY ANGIOGRAPHY N/A 10/13/2017   Procedure: LEFT HEART CATH AND CORONARY ANGIOGRAPHY;  Surgeon: Marykay Lex, MD;  Location: [facility] INVASIVE CV LAB;  Service: Cardiovascular;  Laterality: N/A;    Allergies  Allergies  Allergen Reactions   Naproxen Sodium Rash    History of Present Illness    Emma Bonilla is a PMH of HTN, CAD, HLD, TIA, type 2 diabetes, tobacco abuse, irregular bowel habits, lipoma, iron deficiency anemia, atypical chest pain, and family history of rectal cancer.  Echocardiogram 2022 showed mild aortic stenosis.  Echocardiogram 04/20/2023 showed normal LVEF, G1 DD, mild mitral valve regurgitation, moderate calcification of her aortic valve and  moderate thickening of the aortic valve with mild-moderate regurgitation and mild-moderate aortic valve stenosis with a mean gradient of 18 mmHg and a V-max measuring 2.89 m/s.  She was seen in follow-up by Dr. Rennis Golden on 04/11/2022.  During that time she continued to be stable from a cardiac standpoint.  She reported that 6 weeks prior her blood pressure had spiked.  Of note she has a diagnosis of OSA.  She has been waiting for around 2 years to get her equipment.  She reported that she had been contacted the day prior to her visit about her equipment.  She had been placed on amlodipine by her PCP for better blood pressure control.  Her blood pressure at that time was 128/64.  She was continued on metoprolol.  She denied chest pain and worsening shortness of breath.  She presents to the clinic today for follow-up evaluation and states she recently switched to [facility] for a new primary care provider.  He ordered a cardiac event monitor and drew lab work.  We reviewed her recent echocardiogram.  She expressed understanding.  She reports occasional episodes of brief chest discomfort that lasts seconds and dissipate without intervention.  Her blood pressure today is 114/66 and her pulse was noted to be 71.  I will request her recent lab work and cardiac event monitor results from her primary.  She reports that her sister lived in [place]  and has passed away but did require LVAD implant.  We reviewed LVAD implant.  We will plan follow-up in 1 year.  Today she denies chest pain, shortness of breath, lower extremity edema, fatigue, palpitations, melena, hematuria, hemoptysis, diaphoresis, weakness, presyncope, syncope, orthopnea, and PND.    Home Medications    Prior to Admission medications   Medication Sig Start Date End Date Taking? Authorizing Provider  Accu-Chek Softclix Lancets lancets Check blood sugar up to 7 times per week 06/27/22   Miguel Aschoff, MD  amLODipine (NORVASC) 5 MG  tablet Take 1 tablet (5 mg total) by mouth daily. 06/12/22 06/12/23  Miguel Aschoff, MD  atorvastatin (LIPITOR) 40 MG tablet TAKE 1 TABLET BY MOUTH ONCE DAILY AT 6 PM 05/06/22   Ronney Asters, NP  buPROPion [facility] SR) 150 MG 12 hr tablet TAKE 1 TABLET (150MG  TOTAL) BY MOUTH 2 TIMES A DAY 04/22/22   Miguel Aschoff, MD  calcium-vitamin D (OSCAL WITH D) 500-200 MG-UNIT per tablet Take 1 tablet by mouth daily. 08/08/13   Marrian Salvage, MD  cetirizine (ZYRTEC ALLERGY) 10 MG tablet Take 1 tablet (10 mg total) by mouth daily. 09/08/22 09/08/23  Miguel Aschoff, MD  clopidogrel (PLAVIX) 75 MG tablet Take 1 tablet (75 mg total) by mouth daily with breakfast. 05/06/22   Ronney Asters, NP  ezetimibe (ZETIA) 10 MG tablet TAKE 1 TABLET BY MOUTH DAILY 05/11/23   Ronney Asters, NP  ferrous sulfate 325 (65 FE) MG tablet Take 1 tablet (325 mg total) by mouth daily. 03/31/18   Santos-Sanchez, Chelsea Primus, MD  glucose blood (ACCU-CHEK GUIDE) test strip Check blood sugar up to 7 times per week 06/27/22   Miguel Aschoff, MD  levothyroxine (SYNTHROID) 100 MCG tablet TAKE 1 TABLET ( TOTAL) BY MOUTH DAILY. 03/18/23   Hilty, Lisette Abu, MD  losartan-hydrochlorothiazide (HYZAAR) 100-25 MG tablet Take 1 tablet by mouth daily. 02/16/23   Miguel Aschoff, MD  Melatonin 3 MG TABS Take 3 mg by mouth at bedtime.    [provider]  metFORMIN (GLUCOPHAGE) 500 MG tablet Take 1 tablet (500 mg total) by mouth daily. 04/22/22 04/22/23  Miguel Aschoff, MD  metoprolol succinate (TOPROL-XL) 25 MG 24 hr tablet Take 1 tablet (25 mg total) by mouth daily. 03/04/23   Miguel Aschoff, MD  nitroGLYCERIN (NITROSTAT) 0.4 MG SL tablet Place 1 tablet (0.4 mg total) under the tongue every 5 (five) minutes as needed for chest pain. 06/12/22   Miguel Aschoff, MD  omeprazole (PRILOSEC) 40 MG capsule Take 1 capsule (40 mg total) by mouth 2 (two) times daily. 07/14/22 07/14/23  Miguel Aschoff, MD   PAZEO 0.7 % SOLN PLACE 1 DROP TO EYE DAILY. 08/15/19   Burna Cash, MD    Family History    Family History  Problem Relation Age of Onset   Hypertension Mother    Emphysema Mother    Cancer Mother        lung   Heart failure Mother        CHF   Heart disease Father        MI at 72   Hypertension Father    Crohn's disease Father    Heart disease Sister        leaky valve   Hypertension Sister    Colon cancer Sister        2 Mets   Hypertension Sister    Hyperlipidemia Sister  Heart disease Sister        LVAD 2014-MI in her 63s   Hypertension Sister    Crohn's disease Sister    Congestive Heart Failure Sister    Cancer Sister    Hyperlipidemia Sister    Skin cancer Maternal Grandmother    Stroke Maternal Grandfather    Diabetes Brother    Liver cancer Brother    Hypertension Child    Hyperlipidemia Child    Hyperlipidemia Child    Pancreatic cancer Neg Hx    Stomach cancer Neg Hx    Esophageal cancer Neg Hx    Breast cancer Neg Hx    She indicated that her mother is deceased. She indicated that her father is deceased. She indicated that only one of her five sisters is alive. She indicated that the status of her brother is unknown. She indicated that her maternal grandmother is deceased. She indicated that her maternal grandfather is deceased. She indicated that her paternal grandmother is deceased. She indicated that her paternal grandfather is deceased. She indicated that the status of her neg hx is unknown.  Social History    Social History   Socioeconomic History   Marital status: Married    Spouse name: Not on file   Number of children: 3   Years of education: 12   Highest education level: 12th grade  Occupational History   Not on file  Tobacco Use   Smoking status: Light Smoker    Packs/day: 0.50    Years: 45.00    Additional pack years: 0.00    Total pack years: 22.50    Types: Cigarettes   Smokeless tobacco: Never   Tobacco  comments:    3-4 cigs per day    06/01/2023 Patient smokes 1-2 daily  Vaping Use   Vaping Use: Never used  Substance and Sexual Activity   Alcohol use: Yes    Comment: holidays and special occasion   Drug use: Yes    Frequency: 1.0 times per week    Types: Marijuana    Comment: once weekly or once every 2 weeks   Sexual activity: Yes    Birth control/protection: None  Other Topics Concern   Not on file  Social History Narrative   Epworth Sleepiness Scale (08/21/15) = 9   Social Determinants of Health   Financial Resource Strain: Not on file  Food Insecurity: Not on file  Transportation Needs: No Transportation Needs (05/12/2019)   PRAPARE - Administrator, Civil Service (Medical): No    Lack of Transportation (Non-Medical): No  Physical Activity: Not on file  Stress: Not on file  Social Connections: Not on file  Intimate Partner Violence: Not on file     Review of Systems    General:  No chills, fever, night sweats or weight changes.  Cardiovascular:  No chest pain, dyspnea on exertion, edema, orthopnea, palpitations, paroxysmal nocturnal dyspnea. Dermatological: No rash, lesions/masses Respiratory: No cough, dyspnea Urologic: No hematuria, dysuria Abdominal:   No nausea, vomiting, diarrhea, bright red blood per rectum, melena, or hematemesis Neurologic:  No visual changes, wkns, changes in mental status. All other systems reviewed and are otherwise negative except as noted above.  Physical Exam    VS:  BP 114/66 (BP Location: Left Arm, Patient Position: Sitting, Cuff Size: Large)   Pulse 71   Ht 5\' 7"  (1.702 m)   Wt 224 lb (101.6 kg)   LMP 02/11/2011 Comment: no chance of pregnancy per pt  SpO2  98%   BMI 35.08 kg/m  , BMI Body mass index is 35.08 kg/m. GEN: Well nourished, well developed, in no acute distress. HEENT: normal. Neck: Supple, no JVD, carotid bruits, or masses. Cardiac: RRR, 2-3/6 systolic murmur heard along left sternal border, rubs, or  gallops. No clubbing, cyanosis, edema.  Radials/DP/PT 2+ and equal bilaterally.  Respiratory:  Respirations regular and unlabored, clear to auscultation bilaterally. GI: Soft, nontender, nondistended, BS + x 4. MS: no deformity or atrophy. Skin: warm and dry, no rash. Neuro:  Strength and sensation are intact. Psych: Normal affect.  Accessory Clinical Findings    Recent Labs: 06/12/2022: BUN 10; Creatinine, Ser 1.01; Potassium 3.6; Sodium 143   Recent Lipid Panel    Component Value Date/Time   CHOL 106 05/30/2022 1137   TRIG 82 05/30/2022 1137   HDL 33 (L) 05/30/2022 1137   CHOLHDL 3.2 05/30/2022 1137   CHOLHDL 5.1 10/14/2017 0245   VLDL 24 10/14/2017 0245   LDLCALC 56 05/30/2022 1137         ECG personally reviewed by me today-normal sinus rhythm inferior infarct undetermined age 70 bpm- No acute changes  Echocardiogram 04/02/2023   IMPRESSIONS     1. Left ventricular ejection fraction, by estimation, is 60 to 65%. The  left ventricle has normal function. The left ventricle has no regional  wall motion abnormalities. There is mild concentric left ventricular  hypertrophy. Left ventricular diastolic  parameters are consistent with Grade I diastolic dysfunction (impaired  relaxation).   2. Right ventricular systolic function is normal. The right ventricular  size is normal. There is normal pulmonary artery systolic pressure.   3. The mitral valve is normal in structure. Mild mitral valve  regurgitation. No evidence of mitral stenosis.   4. The aortic valve is normal in structure. There is moderate  calcification of the aortic valve. There is moderate thickening of the  aortic valve. Aortic valve regurgitation is mild to moderate. Mild to  moderate aortic valve stenosis. Aortic  regurgitation PHT measures 543 msec. Aortic valve area, by VTI measures  1.49 cm. Aortic valve mean gradient measures 18.0 mmHg. Aortic valve Vmax  measures 2.89 m/s.   5. The inferior vena  cava is normal in size with greater than 50%  respiratory variability, suggesting right atrial pressure of 3 mmHg.   FINDINGS   Left Ventricle: Left ventricular ejection fraction, by estimation, is 60  to 65%. The left ventricle has normal function. The left ventricle has no  regional wall motion abnormalities. The left ventricular internal cavity  size was normal in size. There is   mild concentric left ventricular hypertrophy. Left ventricular diastolic  parameters are consistent with Grade I diastolic dysfunction (impaired  relaxation). Indeterminate filling pressures.   Right Ventricle: The right ventricular size is normal. No increase in  right ventricular wall thickness. Right ventricular systolic function is  normal. There is normal pulmonary artery systolic pressure. The tricuspid  regurgitant velocity is 2.09 m/s, and   with an assumed right atrial pressure of 3 mmHg, the estimated right  ventricular systolic pressure is 20.5 mmHg.   Left Atrium: Left atrial size was normal in size.   Right Atrium: Right atrial size was normal in size.   Pericardium: There is no evidence of pericardial effusion.   Mitral Valve: The mitral valve is normal in structure. Mild mitral annular  calcification. Mild mitral valve regurgitation. No evidence of mitral  valve stenosis.   Tricuspid Valve: The tricuspid valve is normal  in structure. Tricuspid  valve regurgitation is trivial. No evidence of tricuspid stenosis.   Aortic Valve: The aortic valve is normal in structure. There is moderate  calcification of the aortic valve. There is moderate thickening of the  aortic valve. Aortic valve regurgitation is mild to moderate. Aortic  regurgitation PHT measures 543 msec. Mild   to moderate aortic stenosis is present. Aortic valve mean gradient  measures 18.0 mmHg. Aortic valve peak gradient measures 33.4 mmHg. Aortic  valve area, by VTI measures 1.49 cm.   Pulmonic Valve: The pulmonic valve  was normal in structure. Pulmonic valve  regurgitation is not visualized. No evidence of pulmonic stenosis.   Aorta: The aortic root is normal in size and structure.   Venous: The inferior vena cava is normal in size with greater than 50%  respiratory variability, suggesting right atrial pressure of 3 mmHg.   IAS/Shunts: No atrial level shunt detected by color flow Doppler.   Cardiac catheterization 10/13/2017  Prox RCA lesion is 70% stenosed. Prox RCA to Mid RCA lesion is 85% stenosed. A drug-eluting stent was successfully placed using a STENT SIERRA 3.00 X 38 MM. --Postdilated to 3.5 mm A drug-eluting stent was successfully placed using a STENT SIERRA 3.25 X 08 MM. Overlapping proximally; postdilated to 3.6 mm Post intervention, there is a 0% residual stenosis. Dist RCA lesion is 50% stenosed. RPDA lesion is 60% stenosed. The left ventricular systolic function is normal -hyperdynamic. The left ventricular ejection fraction is greater than 65% by visual estimate. LV end diastolic pressure is normal. There is mild aortic valve stenosis. Peak gradient between 25-30 mmHg   Severe single-vessel disease involving the proximal to mid RCA.  Long area of stenosis covered with a long stent  with a more focal proximal lesion covered with an overlapping stent proximally.   Known mild aortic stenosis.   Plan: Transfer to [facility] for post PCI care and TR band removal Dual antiplatelet therapy for minimum of 3 months (aspirin plus Plavix, could stop aspirin at 3 months and continue Plavix (would likely continue beyond 1 year given the extent of stented segment) Continue aggressive cardiac risk factor modification with hypertension and lipid control. Anticipate discharge tomorrow from cardiology standpoint    Diagnostic Dominance: Right  Intervention         Assessment & Plan   1.  Coronary artery disease-no chest pain today.  Does note brief episodes of chest  discomfort that last for seconds and dissipate without intervention.  Appears to be precordial in nature.  Denies exertional chest discomfort.  Staying physically active caring for her great grand children.  Underwent cardiac catheterization 10/13/2017 which showed proximal RCA lesion 70%, proximal-mid RCA lesion 80% and received PCI with DES x 2 Continue current medical therapy Heart healthy low-sodium diet Increase physical activity as tolerated  Aortic valve stenosis-activity tolerance stable.  Noted to have mild-moderate aortic valve stenosis on echocardiogram 04/02/2023.  Mean gradient 18 mmHg and aortic valve V max measured 2.89 m/s Plan for repeat echocardiogram 5/25  Essential hypertension-BP today 114/66. Maintain blood pressure log Heart healthy low-sodium diet Continue current medical therapy  Hyperlipidemia-LDL 56 on 05/30/22. Continue atorvastatin High-fiber diet  Disposition: Follow-up with Dr. Rennis Golden or me in 12 months.   Thomasene Ripple. Shona Pardo NP-C     06/01/2023, 11:01 AM [facility] [address] Office [phone] Fax [phone]    I spent 14 minutes examining this patient, reviewing medications, and using  patient centered shared decision making involving her cardiac care.  Prior to her visit I spent greater than 20 minutes reviewing her past medical history,  medications, and prior cardiac tests.

## 2023-06-01 ENCOUNTER — Ambulatory Visit: Payer: 59 | Attending: General Practice | Admitting: General Practice

## 2023-06-01 ENCOUNTER — Encounter: Payer: Self-pay | Admitting: General Practice

## 2023-06-01 VITALS — BP 114/66 | HR 71 | Ht 67.0 in | Wt 224.0 lb

## 2023-06-01 DIAGNOSIS — I2510 Atherosclerotic heart disease of native coronary artery without angina pectoris: Secondary | ICD-10-CM | POA: Diagnosis not present

## 2023-06-01 DIAGNOSIS — I10 Essential (primary) hypertension: Secondary | ICD-10-CM | POA: Diagnosis not present

## 2023-06-01 DIAGNOSIS — E782 Mixed hyperlipidemia: Secondary | ICD-10-CM | POA: Diagnosis not present

## 2023-06-01 DIAGNOSIS — I350 Nonrheumatic aortic (valve) stenosis: Secondary | ICD-10-CM

## 2023-06-01 DIAGNOSIS — Z9861 Coronary angioplasty status: Secondary | ICD-10-CM

## 2023-06-01 NOTE — Telephone Encounter (Signed)
Seen in office 06/01/23 by Edd Fabian NP

## 2023-06-01 NOTE — Patient Instructions (Addendum)
Medication Instructions:  Your physician recommends that you continue on your current medications as directed. Please refer to the Current Medication list given to you today.  *If you need a refill on your cardiac medications before your next appointment, please call your pharmacy*   Lab Work: none If you have labs (blood work) drawn today and your tests are completely normal, you will receive your results only by: MyChart Message (if you have MyChart) OR A paper copy in the mail If you have any lab test that is abnormal or we need to change your treatment, we will call you to review the results.   Testing/Procedures: none   Follow-Up: At [facility], you and your health needs are our priority.  As part of our continuing mission to provide you with exceptional heart care, we have created designated Provider Care Teams.  These Care Teams include your primary Cardiologist (physician) and Advanced Practice Providers (APPs -  Physician Assistants and Nurse Practitioners) who all work together to provide you with the care you need, when you need it.  We recommend signing up for the patient portal called "MyChart".  Sign up information is provided on this After Visit Summary.  MyChart is used to connect with patients for Virtual Visits (Telemedicine).  Patients are able to view lab/test results, encounter notes, upcoming appointments, etc.  Non-urgent messages can be sent to your provider as well.   To learn more about what you can do with MyChart, go to [email or web address].    Your next appointment:   1 year(s)  Provider:   Chrystie Nose, MD     Other Instructions Maintain physical activity and diet

## 2023-06-11 ENCOUNTER — Ambulatory Visit
Admission: RE | Admit: 2023-06-11 | Discharge: 2023-06-11 | Disposition: A | Discharge status: Home / Self Care | Payer: 59 | Source: Ambulatory Visit | Attending: Family Medicine | Admitting: Family Medicine

## 2023-06-11 ENCOUNTER — Other Ambulatory Visit: Payer: Self-pay | Admitting: Family Medicine

## 2023-06-11 DIAGNOSIS — M47817 Spondylosis without myelopathy or radiculopathy, lumbosacral region: Secondary | ICD-10-CM

## 2023-07-09 ENCOUNTER — Emergency Department (HOSPITAL_COMMUNITY): Payer: 59

## 2023-07-09 ENCOUNTER — Emergency Department (HOSPITAL_COMMUNITY)
Admission: EM | Admit: 2023-07-09 | Discharge: 2023-07-09 | Disposition: A | Discharge status: Home / Self Care | Payer: 59 | Attending: Emergency Medicine | Admitting: Emergency Medicine

## 2023-07-09 DIAGNOSIS — Z79899 Other long term (current) drug therapy: Secondary | ICD-10-CM | POA: Insufficient documentation

## 2023-07-09 DIAGNOSIS — E876 Hypokalemia: Secondary | ICD-10-CM | POA: Insufficient documentation

## 2023-07-09 DIAGNOSIS — R079 Chest pain, unspecified: Secondary | ICD-10-CM | POA: Diagnosis present

## 2023-07-09 DIAGNOSIS — I10 Essential (primary) hypertension: Secondary | ICD-10-CM | POA: Insufficient documentation

## 2023-07-09 DIAGNOSIS — I2510 Atherosclerotic heart disease of native coronary artery without angina pectoris: Secondary | ICD-10-CM | POA: Diagnosis not present

## 2023-07-09 DIAGNOSIS — R61 Generalized hyperhidrosis: Secondary | ICD-10-CM | POA: Diagnosis not present

## 2023-07-09 LAB — CBC
HCT: 41.2 % (ref 36.0–46.0)
Hemoglobin: 13.5 g/dL (ref 12.0–15.0)
MCH: 29.3 pg (ref 26.0–34.0)
MCHC: 32.8 g/dL (ref 30.0–36.0)
MCV: 89.6 fL (ref 80.0–100.0)
Platelets: 312 10*3/uL (ref 150–400)
RBC: 4.6 MIL/uL (ref 3.87–5.11)
RDW: 14.6 % (ref 11.5–15.5)
WBC: 5.9 10*3/uL (ref 4.0–10.5)
nRBC: 0 % (ref 0.0–0.2)

## 2023-07-09 LAB — TROPONIN I (HIGH SENSITIVITY)
Troponin I (High Sensitivity): 8 ng/L (ref <18)
Troponin I (High Sensitivity): 9 ng/L (ref <18)

## 2023-07-09 LAB — BASIC METABOLIC PANEL
Anion gap: 8 (ref 5–15)
BUN: 11 mg/dL (ref 8–23)
CO2: 26 mmol/L (ref 22–32)
Calcium: 8.7 mg/dL — ABNORMAL LOW (ref 8.9–10.3)
Chloride: 104 mmol/L (ref 98–111)
Creatinine, Ser: 1.23 mg/dL — ABNORMAL HIGH (ref 0.44–1.00)
GFR, Estimated: 48 mL/min — ABNORMAL LOW (ref >60)
Glucose, Bld: 118 mg/dL — ABNORMAL HIGH (ref 70–99)
Potassium: 2.8 mmol/L — ABNORMAL LOW (ref 3.5–5.1)
Sodium: 138 mmol/L (ref 135–145)

## 2023-07-09 LAB — MAGNESIUM: Magnesium: 2.2 mg/dL (ref 1.7–2.4)

## 2023-07-09 MED ORDER — ASPIRIN 81 MG PO CHEW
324.0000 mg | CHEWABLE_TABLET | Freq: Once | ORAL | Status: AC
  Administered 2023-07-09: 324 mg via ORAL
  Filled 2023-07-09: qty 4

## 2023-07-09 MED ORDER — POTASSIUM CHLORIDE 10 MEQ/100ML IV SOLN
10.0000 meq | Freq: Once | INTRAVENOUS | Status: AC
  Administered 2023-07-09: 10 meq via INTRAVENOUS
  Filled 2023-07-09: qty 100

## 2023-07-09 MED ORDER — NITROGLYCERIN 0.4 MG SL SUBL: 0.4000 mg | SUBLINGUAL_TABLET | SUBLINGUAL | Status: DC | PRN

## 2023-07-09 MED ORDER — POTASSIUM CHLORIDE 20 MEQ PO PACK
80.0000 meq | PACK | ORAL | Status: AC
  Administered 2023-07-09: 80 meq via ORAL
  Filled 2023-07-09: qty 4

## 2023-07-09 NOTE — ED Provider Notes (Signed)
[place] EMERGENCY DEPARTMENT AT [facility] Provider Note   CSN: [id number] Arrival date & time: 07/09/23  1833     History  Chief Complaint  Patient presents with   Chest Pain    Emma Bonilla is a 67 y.o. female.  67 year old female with a history of CAD status post RCA PCI, HTN, HLD, and aortic stenosis who presents to the emergency department with chest pressure.  Patient reports that earlier today at approximately 1:30 PM she felt substernal chest pressure.  Says it radiated to her back.  Not exertional or pleuritic.  Said that she felt somewhat dizzy with it as well.  May have had some diaphoresis but no vomiting.  No other radiation of the pain.  Says that it has persisted but has gotten better and is minimal at this point in time.  Is on Plavix but does not take an aspirin today.  No history of DVT or PE or cancer.  No leg swelling recently.  Says that her cardiologist is planning on doing a heart catheterization and placing a stent at some point in time.       Home Medications Prior to Admission medications   Medication Sig Start Date End Date Taking? Authorizing Provider  amLODipine (NORVASC) 5 MG tablet Take 1 tablet (5 mg total) by mouth daily. 06/12/22 07/09/23 Yes Miguel Aschoff, MD  atorvastatin (LIPITOR) 40 MG tablet TAKE 1 TABLET BY MOUTH ONCE DAILY AT 6 PM 05/06/22  Yes Ronney Asters, NP  buPROPion [facility] SR) 150 MG 12 hr tablet TAKE 1 TABLET (150MG  TOTAL) BY MOUTH 2 TIMES A DAY 04/22/22  Yes Miguel Aschoff, MD  calcium-vitamin D (OSCAL WITH D) 500-200 MG-UNIT per tablet Take 1 tablet by mouth daily. 08/08/13  Yes Marrian Salvage, MD  cetirizine (ZYRTEC ALLERGY) 10 MG tablet Take 1 tablet (10 mg total) by mouth daily. 09/08/22 09/08/23 Yes Miguel Aschoff, MD  clopidogrel (PLAVIX) 75 MG tablet Take 1 tablet (75 mg total) by mouth daily with breakfast. 05/06/22  Yes Cleaver, Thomasene Ripple, NP  ezetimibe (ZETIA) 10 MG tablet TAKE 1 TABLET BY MOUTH  DAILY Patient taking differently: Take 10 mg by mouth at bedtime. 05/11/23  Yes Ronney Asters, NP  ferrous sulfate 325 (65 FE) MG tablet Take 1 tablet (325 mg total) by mouth daily. 03/31/18  Yes Santos-Sanchez, Chelsea Primus, MD  levothyroxine (SYNTHROID) 100 MCG tablet TAKE 1 TABLET ( TOTAL) BY MOUTH DAILY. 03/18/23  Yes Hilty, Lisette Abu, MD  losartan-hydrochlorothiazide (HYZAAR) 100-25 MG tablet Take 1 tablet by mouth daily. 02/16/23  Yes Miguel Aschoff, MD  Melatonin 3 MG TABS Take 3 mg by mouth at bedtime.   Yes [provider]  metoprolol succinate (TOPROL-XL) 25 MG 24 hr tablet Take 1 tablet (25 mg total) by mouth daily. 03/04/23  Yes Miguel Aschoff, MD  nitroGLYCERIN (NITROSTAT) 0.4 MG SL tablet Place 1 tablet (0.4 mg total) under the tongue every 5 (five) minutes as needed for chest pain. 06/12/22  Yes Miguel Aschoff, MD  omeprazole (PRILOSEC) 40 MG capsule Take 1 capsule (40 mg total) by mouth 2 (two) times daily. 07/14/22 07/14/23 Yes Miguel Aschoff, MD  PAZEO 0.7 % SOLN PLACE 1 DROP TO EYE DAILY. Patient taking differently: Place 1 drop into both eyes daily. 08/15/19  Yes Santos-Sanchez, Chelsea Primus, MD  spironolactone (ALDACTONE) 25 MG tablet Take 12.5 mg by mouth every morning. 05/21/23  Yes [provider]  VENTOLIN HFA 108 (  90 Base) MCG/ACT inhaler Inhale 2 puffs into the lungs every 4 (four) hours as needed for shortness of breath or wheezing.   Yes [provider]      Allergies    Naproxen sodium    Review of Systems   Review of Systems  Physical Exam Updated Vital Signs BP 124/85   Pulse 64   Temp (!) 97.5 F (36.4 C) (Oral)   Resp 19   Ht 5\' 7"  (1.702 m)   Wt 102.1 kg   LMP 02/11/2011 Comment: no chance of pregnancy per pt  SpO2 98%   BMI 35.24 kg/m  Physical Exam Vitals and nursing note reviewed.  Constitutional:      General: She is not in acute distress.    Appearance: She is well-developed.  HENT:     Head:  Normocephalic and atraumatic.     Right Ear: External ear normal.     Left Ear: External ear normal.     Nose: Nose normal.  Eyes:     Extraocular Movements: Extraocular movements intact.     Conjunctiva/sclera: Conjunctivae normal.     Pupils: Pupils are equal, round, and reactive to light.  Cardiovascular:     Rate and Rhythm: Normal rate and regular rhythm.     Heart sounds: No murmur heard.    Comments: Radial pulses 2+ bilaterally Pulmonary:     Effort: Pulmonary effort is normal. No respiratory distress.     Breath sounds: Normal breath sounds.  Musculoskeletal:     Cervical back: Normal range of motion and neck supple.     Right lower leg: No edema.     Left lower leg: No edema.  Skin:    General: Skin is warm and dry.  Neurological:     Mental Status: She is alert and oriented to person, place, and time. Mental status is at baseline.  Psychiatric:        Mood and Affect: Mood normal.     ED Results / Procedures / Treatments   Labs (all labs ordered are listed, but only abnormal results are displayed) Labs Reviewed  BASIC METABOLIC PANEL - Abnormal; Notable for the following components:      Result Value   Potassium 2.8 (*)    Glucose, Bld 118 (*)    Creatinine, Ser 1.23 (*)    Calcium 8.7 (*)    GFR, Estimated 48 (*)    All other components within normal limits  CBC  MAGNESIUM  TROPONIN I (HIGH SENSITIVITY)  TROPONIN I (HIGH SENSITIVITY)    EKG EKG Interpretation Date/Time:  Thursday July 09 2023 18:38:19 EDT Ventricular Rate:  62 PR Interval:  210 QRS Duration:  74 QT Interval:  398 QTC Calculation: 403 R Axis:   -6  Text Interpretation: Sinus rhythm with 1st degree A-V block Cannot rule out Anterior infarct , age undetermined Abnormal ECG Confirmed by Vonita Moss [phone]) on 07/09/2023 7:59:07 PM  Radiology DG Chest 2 View  Result Date: 07/09/2023 CLINICAL DATA:  Chest pain EXAM: CHEST - 2 VIEW COMPARISON:  X-ray 12/24/2018.  CT 05/14/2023 and  older FINDINGS: No consolidation, pneumothorax or effusion. No edema. Normal cardiopericardial silhouette. IMPRESSION: No acute cardiopulmonary disease. Electronically Signed   By: Karen Kays M.D.   On: 07/09/2023 19:32    Procedures Procedures    Medications Ordered in ED Medications  potassium chloride (KLOR-CON) packet 80 mEq (80 mEq Oral Given 07/09/23 2051)  potassium chloride 10 mEq in 100 mL IVPB (0 mEq Intravenous  Stopped 07/09/23 2143)  aspirin chewable tablet 324 mg (324 mg Oral Given 07/09/23 2049)    ED Course/ Medical Decision Making/ A&P Clinical Course as of 07/10/23 1154  Thu Jul 09, 2023  1958 Creatinine(!): 1.23 At baseline [RP]  2013 Dr Paulino Rily from cardiology consulted regarding the patient's chest pain [RP]    Clinical Course User Index [RP] Rondel Baton, MD                                 Medical Decision Making Amount and/or Complexity of Data Reviewed Labs: ordered. Decision-making details documented in ED Course. Radiology: ordered.  Risk OTC drugs. Prescription drug management.   ZELAH WYLAND is a 67 y.o. female with comorbidities that complicate the patient evaluation including CAD status post RCA PCI, HTN, HLD, and aortic stenosis who presents to the emergency department with chest pressure.    Initial Ddx:  MI, reflux, PE, dissection, pneumonia, pneumothorax  MDM/Course:  Patient presents to the emergency department with chest pressure.  Her description of is concerning for possible myocardial ischemia.  On exam is in no acute distress and her heart and lung sound clear and she has symmetric radial pulses.  Initially was concerned about possible MI so she had an EKG and serial troponins that were reassuring.  She was discussed with cardiology who felt that she is suitable for following up as an outpatient and did not see any indication for heart catheterization at this point in time.  Ambulatory referral was placed for the patient to follow-up  with her cardiologist.  Upon re-evaluation remained stable and was chest pain-free.  Of note, did consider the other above diagnoses but was not having any ripping or tearing chest pain and had symmetric pulses without a widened mediastinum on her chest x-ray making dissection less likely.  Not having any tachycardia or pleuritic pain or significant shortness of breath to suggest PE.  Pneumonia pneumothorax not seen her chest x-ray.  Was found to be hypokalemic which was replenished in the emergency department.  This patient presents to the ED for concern of complaints listed in HPI, this involves an extensive number of treatment options, and is a complaint that carries with it a high risk of complications and morbidity. Disposition including potential need for admission considered.   Dispo: DC Home. Return precautions discussed including, but not limited to, those listed in the AVS. Allowed pt time to ask questions which were answered fully prior to dc.  Records reviewed Outpatient Clinic Notes The following labs were independently interpreted: Serial Troponins and show no acute abnormality I independently reviewed the following imaging with scope of interpretation limited to determining acute life threatening conditions related to emergency care: Chest x-ray and agree with the radiologist interpretation with the following exceptions: none I personally reviewed and interpreted cardiac monitoring: normal sinus rhythm  I personally reviewed and interpreted the pt's EKG: see above for interpretation  I have reviewed the patients home medications and made adjustments as needed Consults: Cardiology Social Determinants of health:  Elderly         Final Clinical Impression(s) / ED Diagnoses Final diagnoses:  Chest pain, unspecified type  Hypokalemia    Rx / DC Orders ED Discharge Orders          Ordered    Ambulatory referral to Cardiology        07/09/23 2154  Rondel Baton, MD 07/10/23 1154

## 2023-07-09 NOTE — ED Triage Notes (Signed)
Pt is coming in for chest pain that started a few hours ago, the origin of the chest pain is located on the left side, with no radiation of the pain or migration. The chest pain is not accompanied with any SHOB, nausea, or vomiting. Pt appears otherwise stable. Does have a Hx of 2 cardiac stents in the past.

## 2023-07-09 NOTE — Discharge Instructions (Signed)
You were seen for your chest pain in the emergency department.   At home, please take Tylenol for your chest discomfort.    Follow-up with your primary doctor in 2-3 days regarding your visit.  Cardiology will be calling you regarding an appointment within the next 72 hours.  You may contact them if you do not hear from them in that time using the information in this packet.  Return immediately to the emergency department if you experience any of the following: Worsening pain, difficulty breathing, unexplained vomiting or sweating, or any other concerning symptoms.    Thank you for visiting our Emergency Department. It was a pleasure taking care of you today.

## 2023-07-20 NOTE — Progress Notes (Unsigned)
Cardiology Clinic Note   Patient Name: Emma Bonilla Date of Encounter: 07/23/2023  Primary Care Provider:  Sharmon Revere, MD Primary Cardiologist:  Chrystie Nose, MD  Patient Profile    67 year old female with history of coronary artery disease, status post RCA PCI, completed on 10/13/2017 with a DES to the proximal RCA (3.0 x 38 mm) followed by DES overlapping proximally to the mid RCA (3.25 x 0.8 mm), hyperlipidemia, hypertension, and aortic valve stenosis.  Seen in the ED on 07/09/2023 for recurrent chest pain radiating to her back with substernal chest pressure.  She was ruled out for ACS.  Cardiology was notified and they did not feel as if she was a candidate for cardiac catheterization or admission at that time.  She was found to be hypokalemic and this was repleted.  She is here for post ED follow-up.  Past Medical History    Past Medical History:  Diagnosis Date   Anemia    Arthritis    Depression    Diastolic dysfunction without heart failure    Family history of rectal cancer    GERD (gastroesophageal reflux disease)    Hepatic steatosis    a. seen on prior US.   Hyperlipidemia    Hypertension    Mild aortic regurgitation    Mild aortic stenosis    Premature atrial contractions    PVC's (premature ventricular contractions)    Thyroid disease    TIA (transient ischemic attack)    2010   Tobacco abuse    Past Surgical History:  Procedure Laterality Date   CORONARY STENT INTERVENTION N/A 10/13/2017   Procedure: CORONARY STENT INTERVENTION;  Surgeon: Marykay Lex, MD;  Location: [facility] INVASIVE CV LAB;  Service: Cardiovascular;  Laterality: N/A;   DILATION AND CURETTAGE OF UTERUS     30 years ago   FOOT SURGERY     LEFT HEART CATH AND CORONARY ANGIOGRAPHY N/A 10/13/2017   Procedure: LEFT HEART CATH AND CORONARY ANGIOGRAPHY;  Surgeon: Marykay Lex, MD;  Location: [facility] INVASIVE CV LAB;  Service: Cardiovascular;  Laterality: N/A;    Allergies  Allergies   Allergen Reactions   Naproxen Sodium Rash    History of Present Illness    Emma Bonilla comes today with continued complaints of occasional chest tightness.  She has chronic dyspnea on exertion, and has had to use her inhaler more often.  She has 2 dogs living with her that belonged to her children and since they have arrived her breathing status has worsened with more frequency and using inhaler.  She continues to have issues with lower extremity cramping on occasion.  She was found to be hypokalemic during ED visit for which she was repleted she admits to a GI bug prior to this by 1 week causing a lot of diarrhea.  She remains on spironolactone 12.5 mg daily.  She continues to use CPAP for OSA.  Home Medications    Current Outpatient Medications  Medication Sig Dispense Refill   amLODipine (NORVASC) 5 MG tablet Take 1 tablet (5 mg total) by mouth daily. 90 tablet 3   atorvastatin (LIPITOR) 40 MG tablet TAKE 1 TABLET BY MOUTH ONCE DAILY AT 6 PM 90 tablet 3   buPROPion (WELLBUTRIN SR) 150 MG 12 hr tablet TAKE 1 TABLET (150MG  TOTAL) BY MOUTH 2 TIMES A DAY 180 tablet 3   calcium-vitamin D (OSCAL WITH D) 500-200 MG-UNIT per tablet Take 1 tablet by mouth daily. 30 tablet 3   cetirizine (ZYRTEC  ALLERGY) 10 MG tablet Take 1 tablet (10 mg total) by mouth daily. 90 tablet 3   clopidogrel (PLAVIX) 75 MG tablet Take 1 tablet (75 mg total) by mouth daily with breakfast. 90 tablet 3   ezetimibe (ZETIA) 10 MG tablet TAKE 1 TABLET BY MOUTH DAILY (Patient taking differently: Take 10 mg by mouth at bedtime.) 100 tablet 2   ferrous sulfate 325 (65 FE) MG tablet Take 1 tablet (325 mg total) by mouth daily. 30 tablet 3   levothyroxine (SYNTHROID) 100 MCG tablet TAKE 1 TABLET ( TOTAL) BY MOUTH DAILY. 90 tablet 0   losartan-hydrochlorothiazide (HYZAAR) 100-25 MG tablet Take 1 tablet by mouth daily. 90 tablet 3   Melatonin 3 MG TABS Take 3 mg by mouth at bedtime.     metoprolol succinate (TOPROL-XL) 25 MG 24  hr tablet Take 1 tablet (25 mg total) by mouth daily. 90 tablet 3   omeprazole (PRILOSEC) 40 MG capsule Take 1 capsule (40 mg total) by mouth 2 (two) times daily. 180 capsule 3   PAZEO 0.7 % SOLN PLACE 1 DROP TO EYE DAILY. (Patient taking differently: Place 1 drop into both eyes daily.) 7.5 mL 0   spironolactone (ALDACTONE) 25 MG tablet Take 12.5 mg by mouth every morning.     VENTOLIN HFA 108 (90 Base) MCG/ACT inhaler Inhale 2 puffs into the lungs every 4 (four) hours as needed for shortness of breath or wheezing.     nitroGLYCERIN (NITROSTAT) 0.4 MG SL tablet Place 1 tablet (0.4 mg total) under the tongue every 5 (five) minutes as needed for chest pain. 90 tablet 0   No current facility-administered medications for this visit.     Family History    Family History  Problem Relation Age of Onset   Hypertension Mother    Emphysema Mother    Cancer Mother        lung   Heart failure Mother        CHF   Heart disease Father        MI at 10   Hypertension Father    Crohn's disease Father    Heart disease Sister        leaky valve   Hypertension Sister    Colon cancer Sister        58 Mets   Hypertension Sister    Hyperlipidemia Sister    Heart disease Sister        LVAD 2014-MI in her 71s   Hypertension Sister    Crohn's disease Sister    Congestive Heart Failure Sister    Cancer Sister    Hyperlipidemia Sister    Skin cancer Maternal Grandmother    Stroke Maternal Grandfather    Diabetes Brother    Liver cancer Brother    Hypertension Child    Hyperlipidemia Child    Hyperlipidemia Child    Pancreatic cancer Neg Hx    Stomach cancer Neg Hx    Esophageal cancer Neg Hx    Breast cancer Neg Hx    She indicated that her mother is deceased. She indicated that her father is deceased. She indicated that only one of her five sisters is alive. She indicated that the status of her brother is unknown. She indicated that her maternal grandmother is deceased. She indicated that her  maternal grandfather is deceased. She indicated that her paternal grandmother is deceased. She indicated that her paternal grandfather is deceased. She indicated that the status of her neg hx is unknown.  Social History    Social History   Socioeconomic History   Marital status: Married    Spouse name: Not on file   Number of children: 3   Years of education: 12   Highest education level: 12th grade  Occupational History   Not on file  Tobacco Use   Smoking status: Light Smoker    Current packs/day: 0.50    Average packs/day: 0.5 packs/day for 45.0 years (22.5 ttl pk-yrs)    Types: Cigarettes   Smokeless tobacco: Never   Tobacco comments:    3-4 cigs per day    06/01/2023 Patient smokes 1-2 daily  Vaping Use   Vaping status: Never Used  Substance and Sexual Activity   Alcohol use: Yes    Comment: holidays and special occasion   Drug use: Yes    Frequency: 1.0 times per week    Types: Marijuana    Comment: once weekly or once every 2 weeks   Sexual activity: Yes    Birth control/protection: None  Other Topics Concern   Not on file  Social History Narrative   Epworth Sleepiness Scale (08/21/15) = 9   Social Determinants of Health   Financial Resource Strain: Not on file  Food Insecurity: Not on file  Transportation Needs: No Transportation Needs (05/12/2019)   PRAPARE - Administrator, Civil Service (Medical): No    Lack of Transportation (Non-Medical): No  Physical Activity: Not on file  Stress: Not on file  Social Connections: Unknown (06/01/2023)   Received from [facility]   Social Network    Social Network: Not on file  Intimate Partner Violence: Unknown (06/01/2023)   Received from [facility]   HITS    Physically Hurt: Not on file    Insult or Talk Down To: Not on file    Threaten Physical Harm: Not on file    Scream or Curse: Not on file     Review of Systems    General:  No chills, fever, night sweats or weight changes.  Cardiovascular:   No chest pain, chronic dyspnea on exertion, edema, orthopnea, palpitations, paroxysmal nocturnal dyspnea. Dermatological: No rash, lesions/masses Respiratory: No cough, chronic dyspnea, using inhaler more often Urologic: No hematuria, dysuria Abdominal:   No nausea, vomiting, diarrhea, bright red blood per rectum, melena, or hematemesis Neurologic:  No visual changes, wkns, changes in mental status. All other systems reviewed and are otherwise negative except as noted above.       Physical Exam    VS:  BP 122/70   Pulse 70   Ht 5\' 7"  (1.702 m)   Wt 220 lb 9.6 oz (100.1 kg)   LMP 02/11/2011 Comment: no chance of pregnancy per pt  SpO2 99%   BMI 34.55 kg/m  , BMI Body mass index is 34.55 kg/m.     GEN: Well nourished, well developed, in no acute distress. HEENT: normal. Neck: Supple, no JVD, carotid bruits, or masses. Cardiac: RRR, no murmurs, rubs, or gallops. No clubbing, cyanosis, edema.  Radials/DP/PT 2+ and equal bilaterally.  Respiratory:  Respirations regular and unlabored, some inspiratory wheezes are noted, no crackles or rails.   GI: Soft, nontender, nondistended, BS + x 4. MS: no deformity or atrophy. Skin: warm and dry, no rash. Neuro:  Strength and sensation are intact. Psych: Normal affect.      Lab Results  Component Value Date   WBC 5.9 07/09/2023   HGB 13.5 07/09/2023   HCT 41.2 07/09/2023   MCV  89.6 07/09/2023   PLT 312 07/09/2023   Lab Results  Component Value Date   CREATININE 1.23 (H) 07/09/2023   BUN 11 07/09/2023   NA 138 07/09/2023   K 2.8 (L) 07/09/2023   CL 104 07/09/2023   CO2 26 07/09/2023   Lab Results  Component Value Date   ALT 16 01/16/2022   AST 17 01/16/2022   ALKPHOS 126 (H) 01/16/2022   BILITOT 0.3 01/16/2022   Lab Results  Component Value Date   CHOL 106 05/30/2022   HDL 33 (L) 05/30/2022   LDLCALC 56 05/30/2022   TRIG 82 05/30/2022   CHOLHDL 3.2 05/30/2022    Lab Results  Component Value Date   HGBA1C 5.9 (A)  06/12/2022     Review of Prior Studies  Echocardiogram 04/02/2023   1. Left ventricular ejection fraction, by estimation, is 60 to 65%. The  left ventricle has normal function. The left ventricle has no regional  wall motion abnormalities. There is mild concentric left ventricular  hypertrophy. Left ventricular diastolic  parameters are consistent with Grade I diastolic dysfunction (impaired  relaxation).   2. Right ventricular systolic function is normal. The right ventricular  size is normal. There is normal pulmonary artery systolic pressure.   3. The mitral valve is normal in structure. Mild mitral valve  regurgitation. No evidence of mitral stenosis.   4. The aortic valve is normal in structure. There is moderate  calcification of the aortic valve. There is moderate thickening of the  aortic valve. Aortic valve regurgitation is mild to moderate. Mild to  moderate aortic valve stenosis. Aortic  regurgitation PHT measures 543 msec. Aortic valve area, by VTI measures  1.49 cm. Aortic valve mean gradient measures 18.0 mmHg. Aortic valve Vmax  measures 2.89 m/s.   5. The inferior vena cava is normal in size with greater than 50%  respiratory variability, suggesting right atrial pressure of 3 mmHg.      [facility] 06/01/2023 Cardiac catheterization 10/13/2017   Prox RCA lesion is 70% stenosed. Prox RCA to Mid RCA lesion is 85% stenosed. A drug-eluting stent was successfully placed using a STENT SIERRA 3.00 X 38 MM. --Postdilated to 3.5 mm A drug-eluting stent was successfully placed using a STENT SIERRA 3.25 X 08 MM. Overlapping proximally; postdilated to 3.6 mm Post intervention, there is a 0% residual stenosis. Dist RCA lesion is 50% stenosed. RPDA lesion is 60% stenosed. The left ventricular systolic function is normal -hyperdynamic. The left ventricular ejection fraction is greater than 65% by visual estimate. LV end diastolic pressure is normal. There is mild aortic valve stenosis.  Peak gradient between 25-30 mmHg   Severe single-vessel disease involving the proximal to mid RCA.  Long area of stenosis covered with a long stent  with a more focal proximal lesion covered with an overlapping stent proximally.   Known mild aortic stenosis.  Assessment & Plan   1.  Hypokalemia: Will recheck BMET and magnesium level today.  She is on a PPI as well as spironolactone 12.5 mg daily.  She is given a list of high potassium foods which she may choose from if she remains hypokalemic.  May need to adjust spironolactone to 25 mg daily and decrease amlodipine to allow for steady state of magnesium status.  2.  Coronary artery disease: Status post PTCA.  She remains on Plavix 75 mg daily without evidence of bleeding, bruising, or epistaxis.  She denies any significant recurrence of chest pain.  She does have occasional sharp  fleeting pain.  3.  Chronic dyspnea: She has been using her inhalers a lot more lately since there are 2 dogs living with her that belonged to her children creating a lot of dander and dust.  I have advised her to use a home air filter, and to keep the animals out of her bedroom.  She is also advised to speak with her children about removing the animals as she is keeping them for them until they find an apartment.  This will help her respiratory status improved significantly.  4.  Hypercholesterolemia: Goal of LDL less than 70.  Remain on Lipitor 40 mg daily, low-cholesterol diet, purposeful exercise and weight loss.       Signed, Bettey Mare. Liborio Nixon, ANP, AACC   07/23/2023 12:55 PM      Office [phone] Fax [phone]  Notice: This dictation was prepared with Dragon dictation along with smaller phrase technology. Any transcriptional errors that result from this process are unintentional and may not be corrected upon review.

## 2023-07-23 ENCOUNTER — Ambulatory Visit: Payer: 59 | Attending: Adult Health | Admitting: Adult Health

## 2023-07-23 ENCOUNTER — Encounter: Payer: Self-pay | Admitting: Adult Health

## 2023-07-23 VITALS — BP 122/70 | HR 70 | Ht 67.0 in | Wt 220.6 lb

## 2023-07-23 DIAGNOSIS — I2583 Coronary atherosclerosis due to lipid rich plaque: Secondary | ICD-10-CM

## 2023-07-23 DIAGNOSIS — I2510 Atherosclerotic heart disease of native coronary artery without angina pectoris: Secondary | ICD-10-CM | POA: Diagnosis not present

## 2023-07-23 DIAGNOSIS — Z79899 Other long term (current) drug therapy: Secondary | ICD-10-CM

## 2023-07-23 DIAGNOSIS — E7800 Pure hypercholesterolemia, unspecified: Secondary | ICD-10-CM

## 2023-07-23 DIAGNOSIS — E876 Hypokalemia: Secondary | ICD-10-CM

## 2023-07-23 DIAGNOSIS — R0789 Other chest pain: Secondary | ICD-10-CM

## 2023-07-23 DIAGNOSIS — R0609 Other forms of dyspnea: Secondary | ICD-10-CM

## 2023-07-23 MED ORDER — NITROGLYCERIN 0.4 MG SL SUBL: 0.4000 mg | SUBLINGUAL_TABLET | SUBLINGUAL | 0 refills | Status: DC | PRN

## 2023-07-23 NOTE — Patient Instructions (Signed)
Medication Instructions:  NO CHANGES *If you need a refill on your cardiac medications before your next appointment, please call your pharmacy*   Lab Work: BMP, MAGNESIUM, AND CBC TODAY If you have labs (blood work) drawn today and your tests are completely normal, you will receive your results only by: MyChart Message (if you have MyChart) OR A paper copy in the mail If you have any lab test that is abnormal or we need to change your treatment, we will call you to review the results.   Testing/Procedures: NO TESTING   Follow-Up: At [facility], you and your health needs are our priority.  As part of our continuing mission to provide you with exceptional heart care, we have created designated Provider Care Teams.  These Care Teams include your primary Cardiologist (physician) and Advanced Practice Providers (APPs -  Physician Assistants and Nurse Practitioners) who all work together to provide you with the care you need, when you need it.  We recommend signing up for the patient portal called "MyChart".  Sign up information is provided on this After Visit Summary.  MyChart is used to connect with patients for Virtual Visits (Telemedicine).  Patients are able to view lab/test results, encounter notes, upcoming appointments, etc.  Non-urgent messages can be sent to your provider as well.   To learn more about what you can do with MyChart, go to [email or web address].    Your next appointment:   3 month(s)  Provider:   Joni Reining, DNP, ANP or Zoila Shutter, MD  Other Instructions POTASSIUM RICH FOODS RECOMMEND USING AIR FILTER

## 2023-07-24 LAB — CBC
Hematocrit: 43.7 % (ref 34.0–46.6)
Hemoglobin: 14.1 g/dL (ref 11.1–15.9)
MCH: 28.8 pg (ref 26.6–33.0)
MCHC: 32.3 g/dL (ref 31.5–35.7)
MCV: 89 fL (ref 79–97)
Platelets: 365 10*3/uL (ref 150–450)
RBC: 4.89 x10E6/uL (ref 3.77–5.28)
RDW: 14.1 % (ref 11.7–15.4)
WBC: 5.9 10*3/uL (ref 3.4–10.8)

## 2023-07-24 LAB — BASIC METABOLIC PANEL
BUN/Creatinine Ratio: 7 — ABNORMAL LOW (ref 12–28)
BUN: 9 mg/dL (ref 8–27)
CO2: 26 mmol/L (ref 20–29)
Calcium: 9.5 mg/dL (ref 8.7–10.3)
Chloride: 101 mmol/L (ref 96–106)
Creatinine, Ser: 1.29 mg/dL — ABNORMAL HIGH (ref 0.57–1.00)
Glucose: 101 mg/dL — ABNORMAL HIGH (ref 70–99)
Potassium: 4.3 mmol/L (ref 3.5–5.2)
Sodium: 139 mmol/L (ref 134–144)
eGFR: 45 mL/min/{1.73_m2} — ABNORMAL LOW (ref >59)

## 2023-07-24 LAB — MAGNESIUM: Magnesium: 2.3 mg/dL (ref 1.6–2.3)

## 2023-07-28 ENCOUNTER — Telehealth: Payer: Self-pay

## 2023-07-28 ENCOUNTER — Other Ambulatory Visit: Payer: Self-pay

## 2023-07-28 DIAGNOSIS — I10 Essential (primary) hypertension: Secondary | ICD-10-CM

## 2023-07-28 NOTE — Telephone Encounter (Addendum)
Called patient regarding results. Pateint had understanding of results. Patient advised to repeat BMET 1 week prior to appointment.----- Message from Joni Reining sent at 07/26/2023  9:31 AM EDT ----- I have reviewed her labs. Potassium is normal now. Slightly elevated creatinine compared to last results 2 weeks earlier.  Will need to increase her fluids a bit. She is on spironolactone 12.5 mg daily and HCTZ 25 mg daily as part of losartan/HCTZ combination.This will cause some increase in the creatinine. This will need to be followed.  Repeat the BMET in 3 months so that we can watch her kidney function on these medications.

## 2023-10-08 ENCOUNTER — Other Ambulatory Visit: Payer: Self-pay

## 2023-10-08 DIAGNOSIS — R0789 Other chest pain: Secondary | ICD-10-CM

## 2023-10-08 MED ORDER — NITROGLYCERIN 0.4 MG SL SUBL
0.4000 mg | SUBLINGUAL_TABLET | SUBLINGUAL | 2 refills | Status: AC | PRN
Start: 2023-10-08 — End: ?

## 2023-10-13 ENCOUNTER — Inpatient Hospital Stay
Admission: RE | Admit: 2023-10-13 | Discharge: 2023-10-13 | Disposition: A | Discharge status: Home / Self Care | Payer: 59 | Source: Ambulatory Visit | Attending: Internal Medicine | Admitting: Internal Medicine

## 2023-10-13 DIAGNOSIS — Z780 Asymptomatic menopausal state: Secondary | ICD-10-CM

## 2023-10-25 NOTE — Progress Notes (Deleted)
Cardiology Office Note:  .   Date:  10/25/2023  ID:  [name], DOB 03-Dec-1955, MRN [id number] PCP: Sharmon Revere, MD  [facility] Providers Cardiologist: Chrystie Nose, MD {}   }   History of Present Illness: Emma Bonilla   Emma Bonilla is a 67 y.o. female  with history of coronary artery disease, status post RCA PCI, completed on 10/13/2017 with a DES to the proximal RCA (3.0 x 38 mm) followed by DES overlapping proximally to the mid RCA (3.25 x 0.8 mm), hyperlipidemia, hypertension, and aortic valve stenosis.  When last seen in the office on 07/23/2023 she was evaluated posthospitalization for hypokalemia and follow-up BMET was ordered.  Potassium was 4.3.  No medication changes were made she was to be followed closely as she was on spironolactone and losartan/HCTZ  ROS: ***  Studies Reviewed: .     Echocardiogram 04/02/2023   1. Left ventricular ejection fraction, by estimation, is 60 to 65%. The  left ventricle has normal function. The left ventricle has no regional  wall motion abnormalities. There is mild concentric left ventricular  hypertrophy. Left ventricular diastolic  parameters are consistent with Grade I diastolic dysfunction (impaired  relaxation).   2. Right ventricular systolic function is normal. The right ventricular  size is normal. There is normal pulmonary artery systolic pressure.   3. The mitral valve is normal in structure. Mild mitral valve  regurgitation. No evidence of mitral stenosis.   4. The aortic valve is normal in structure. There is moderate  calcification of the aortic valve. There is moderate thickening of the  aortic valve. Aortic valve regurgitation is mild to moderate. Mild to  moderate aortic valve stenosis. Aortic  regurgitation PHT measures 543 msec. Aortic valve area, by VTI measures  1.49 cm. Aortic valve mean gradient measures 18.0 mmHg. Aortic valve Vmax  measures 2.89 m/s.   5. The inferior vena cava is normal in size with  greater than 50%  respiratory variability, suggesting right atrial pressure of 3 mmHg.      LHC 06/01/2023 Cardiac catheterization 10/13/2017   Prox RCA lesion is 70% stenosed. Prox RCA to Mid RCA lesion is 85% stenosed. A drug-eluting stent was successfully placed using a STENT SIERRA 3.00 X 38 MM. --Postdilated to 3.5 mm A drug-eluting stent was successfully placed using a STENT SIERRA 3.25 X 08 MM. Overlapping proximally; postdilated to 3.6 mm Post intervention, there is a 0% residual stenosis. Dist RCA lesion is 50% stenosed. RPDA lesion is 60% stenosed. The left ventricular systolic function is normal -hyperdynamic. The left ventricular ejection fraction is greater than 65% by visual estimate. LV end diastolic pressure is normal. There is mild aortic valve stenosis. Peak gradient between 25-30 mmHg   Severe single-vessel disease involving the proximal to mid RCA.  Long area of stenosis covered with a long stent  with a more focal proximal lesion covered with an overlapping stent proximally.   Known mild aortic stenosis.   *** EKG Interpretation Date/Time:    Ventricular Rate:    PR Interval:    QRS Duration:    QT Interval:    QTC Calculation:   R Axis:      Text Interpretation:      Physical Exam:   VS:  LMP 02/11/2011 Comment: no chance of pregnancy per pt   Wt Readings from Last 3 Encounters:  07/23/23 220 lb 9.6 oz (100.1 kg)  07/09/23 225 lb (102.1 kg)  06/01/23 224 lb (101.6 kg)  GEN: Well nourished, well developed in no acute distress NECK: No JVD; No carotid bruits CARDIAC: ***RRR, no murmurs, rubs, gallops RESPIRATORY:  Clear to auscultation without rales, wheezing or rhonchi  ABDOMEN: Soft, non-tender, non-distended EXTREMITIES:  No edema; No deformity   ASSESSMENT AND PLAN: .   ***    {Are you ordering a CV Procedure (e.g. stress test, cath, DCCV, TEE, etc)?   Press F2        :[id number]}    Signed, Bettey Mare. Liborio Nixon, ANP, AACC

## 2023-10-27 ENCOUNTER — Ambulatory Visit: Payer: 59 | Admitting: Adult Health

## 2023-11-30 NOTE — Progress Notes (Signed)
 Cardiology Office Note:  .   Date:  11/30/2023  ID:  [name], DOB January 16, 1956, MRN [id number] PCP: Haze Kingfisher, MD  [facility] Providers Cardiologist:  Vinie JAYSON Maxcy, MD  }   History of Present Illness: Emma   CHAPEL Bonilla is a 67 y.o. female with history of coronary artery disease, status post RCA PCI, completed on 10/13/2017 with a DES to the proximal RCA (3.0 x 38 mm) followed by DES overlapping proximally to the mid RCA (3.25 x 0.8 mm), hyperlipidemia, hypertension, and aortic valve stenosis.    Seen in the ED on 07/09/2023 for recurrent chest pain radiating to her back with substernal chest pressure.  She was ruled out for ACS.  Cardiology was notified and they did not feel as if she was a candidate for cardiac catheterization.  She is here today without any significant cardiac complaints.  She denies any chest pain, she does have some mild dyspnea on exertion.  She continues to have palpitations on occasion.  She states this especially occurs when she is exerting herself.  She has had a ZIO monitor by her primary care provider which was negative for any significant arrhythmias.  She has been medically compliant.  Remains on Plavix  without any evidence of bleeding or bruising.  ROS: As above otherwise negative  Studies Reviewed: .      Echocardiogram 04/02/2023   1. Left ventricular ejection fraction, by estimation, is 60 to 65%. The  left ventricle has normal function. The left ventricle has no regional  wall motion abnormalities. There is mild concentric left ventricular  hypertrophy. Left ventricular diastolic  parameters are consistent with Grade I diastolic dysfunction (impaired  relaxation).   2. Right ventricular systolic function is normal. The right ventricular  size is normal. There is normal pulmonary artery systolic pressure.   3. The mitral valve is normal in structure. Mild mitral valve  regurgitation. No evidence of mitral stenosis.   4. The aortic  valve is normal in structure. There is moderate  calcification of the aortic valve. There is moderate thickening of the  aortic valve. Aortic valve regurgitation is mild to moderate. Mild to  moderate aortic valve stenosis. Aortic  regurgitation PHT measures 543 msec. Aortic valve area, by VTI measures  1.49 cm. Aortic valve mean gradient measures 18.0 mmHg. Aortic valve Vmax  measures 2.89 m/s.   5. The inferior vena cava is normal in size with greater than 50%  respiratory variability, suggesting right atrial pressure of 3 mmHg.        LHC 06/01/2023 Cardiac catheterization 10/13/2017   Prox RCA lesion is 70% stenosed. Prox RCA to Mid RCA lesion is 85% stenosed. A drug-eluting stent was successfully placed using a STENT SIERRA 3.00 X 38 MM. --Postdilated to 3.5 mm A drug-eluting stent was successfully placed using a STENT SIERRA 3.25 X 08 MM. Overlapping proximally; postdilated to 3.6 mm Post intervention, there is a 0% residual stenosis. Dist RCA lesion is 50% stenosed. RPDA lesion is 60% stenosed. The left ventricular systolic function is normal -hyperdynamic. The left ventricular ejection fraction is greater than 65% by visual estimate. LV end diastolic pressure is normal. There is mild aortic valve stenosis. Peak gradient between 25-30 mmHg   Severe single-vessel disease involving the proximal to mid RCA.  Long area of stenosis covered with a long stent  with a more focal proximal lesion covered with an overlapping stent proximally.  Physical Exam:   VS:  LMP 02/11/2011 Comment:  no chance of pregnancy per pt   Wt Readings from Last 3 Encounters:  07/23/23 220 lb 9.6 oz (100.1 kg)  07/09/23 225 lb (102.1 kg)  06/01/23 224 lb (101.6 kg)    GEN: Well nourished, well developed in no acute distress NECK: No JVD; No carotid bruits CARDIAC: RRR, 2/6 holosystolic murmur, heard best at the right sternal border, some radiation into the carotids, soft systolic murmur left sternal border,  no rubs, gallops RESPIRATORY:  Clear to auscultation without rales, wheezing or rhonchi  ABDOMEN: Soft, non-tender, non-distended EXTREMITIES:  No edema; No deformity   ASSESSMENT AND PLAN: .    Coronary artery disease: History of stent to the RCA on 10/13/2017, 3.0 x 38 mm, followed by overlapping proximal to mid RCA, 3.25 x 0.8 mm).  She remains on secondary prevention with blood pressure control, statin therapy.  She has not had any recurrence of chest discomfort.  Would recommend weight loss and purposeful exercise to help with overall cardiovascular health.  She may have refills on any cardiac medications that she requires.  She states that she is not aware of any that she needs at this time.  2.  Aortic valve stenosis: Recommend repeat echocardiogram in 1 year for ongoing surveillance.  She is asymptomatic at this time.  3.  Hypercholesterolemia: Goal of LDL is less than 70.  She remains on atorvastatin  40 mg daily and Zetia  10 mg daily.  Labs are followed by PCP.  Most recent labs from our office total cholesterol was 106, HDL 33, LDL 58, on 05/30/2022.  4.  Hypertension: Blood pressure is very well-controlled today.  Will continue her on amlodipine  5 mg daily losartan  HCTZ 10/25 mg daily, metoprolol  25 mg daily, and spironolactone 12.5 mg daily.  Labs are followed by PCP.  Most recent labs completed on 07/23/2023 creatinine 1.29, potassium 4.3.    5.  Morbid obesity: Weight loss and purposeful exercise is recommended for overall cardiovascular health.  May need to be considered for GLP.  Will defer to PCP   Signed, Emma Bonilla. Jerilynn CHOL, ANP, AACC

## 2023-12-03 ENCOUNTER — Encounter: Payer: Self-pay | Admitting: Adult Health

## 2023-12-03 ENCOUNTER — Ambulatory Visit: Payer: 59 | Attending: Internal Medicine | Admitting: Adult Health

## 2023-12-03 VITALS — BP 124/66 | HR 73 | Ht 67.0 in | Wt 218.0 lb

## 2023-12-03 DIAGNOSIS — I350 Nonrheumatic aortic (valve) stenosis: Secondary | ICD-10-CM

## 2023-12-03 DIAGNOSIS — E7800 Pure hypercholesterolemia, unspecified: Secondary | ICD-10-CM

## 2023-12-03 DIAGNOSIS — I2510 Atherosclerotic heart disease of native coronary artery without angina pectoris: Secondary | ICD-10-CM

## 2023-12-03 DIAGNOSIS — I10 Essential (primary) hypertension: Secondary | ICD-10-CM | POA: Diagnosis not present

## 2023-12-03 DIAGNOSIS — Z9861 Coronary angioplasty status: Secondary | ICD-10-CM

## 2023-12-03 NOTE — Patient Instructions (Addendum)
 Medication Instructions:  NO CHANGES    Lab Work: NONE    Testing/Procedures: SCHEDULED FOR AN ECHO IN MAY 2025 Your physician has requested that you have an echocardiogram. Echocardiography is a painless test that uses sound waves to create images of your heart. It provides your doctor with information about the size and shape of your heart and how well your heart's chambers and valves are working. This procedure takes approximately one hour. There are no restrictions for this procedure. Please do NOT wear cologne, perfume, aftershave, or lotions (deodorant is allowed). Please arrive 15 minutes prior to your appointment time.  Please note: We ask at that you not bring children with you during ultrasound (echo/ vascular) testing. Due to room size and safety concerns, children are not allowed in the ultrasound rooms during exams. Our front office staff cannot provide observation of children in our lobby area while testing is being conducted. An adult accompanying a patient to their appointment will only be allowed in the ultrasound room at the discretion of the ultrasound technician under special circumstances. We apologize for any inconvenience.    Follow-Up: At [facility], you and your health needs are our priority.  As part of our continuing mission to provide you with exceptional heart care, we have created designated Provider Care Teams.  These Care Teams include your primary Cardiologist (physician) and Advanced Practice Providers (APPs -  Physician Assistants and Nurse Practitioners) who all work together to provide you with the care you need, when you need it.     Your next appointment:   6 MONTHS (JUNE) AFTER ECHO    Provider:   Vinie JAYSON Maxcy, MD

## 2024-04-01 ENCOUNTER — Ambulatory Visit (HOSPITAL_COMMUNITY): Payer: 59 | Attending: Cardiology

## 2024-04-01 DIAGNOSIS — I350 Nonrheumatic aortic (valve) stenosis: Secondary | ICD-10-CM | POA: Diagnosis present

## 2024-04-01 LAB — ECHOCARDIOGRAM COMPLETE
AR max vel: 1.7 cm2
AV Area VTI: 1.87 cm2
AV Area mean vel: 1.68 cm2
AV Mean grad: 13.8 mmHg
AV Peak grad: 25 mmHg
Ao pk vel: 2.5 m/s
Area-P 1/2: 2.53 cm2
P 1/2 time: 559 ms
S' Lateral: 2.6 cm

## 2024-04-04 ENCOUNTER — Other Ambulatory Visit (HOSPITAL_COMMUNITY): Payer: Self-pay

## 2024-04-04 DIAGNOSIS — E782 Mixed hyperlipidemia: Secondary | ICD-10-CM

## 2024-04-04 DIAGNOSIS — I2510 Atherosclerotic heart disease of native coronary artery without angina pectoris: Secondary | ICD-10-CM

## 2024-04-04 DIAGNOSIS — I10 Essential (primary) hypertension: Secondary | ICD-10-CM

## 2024-04-04 DIAGNOSIS — I350 Nonrheumatic aortic (valve) stenosis: Secondary | ICD-10-CM

## 2024-04-05 ENCOUNTER — Other Ambulatory Visit (HOSPITAL_COMMUNITY): Payer: Self-pay | Admitting: *Deleted

## 2024-04-05 DIAGNOSIS — I350 Nonrheumatic aortic (valve) stenosis: Secondary | ICD-10-CM

## 2024-04-18 ENCOUNTER — Other Ambulatory Visit: Payer: Self-pay | Admitting: Family Medicine

## 2024-04-18 DIAGNOSIS — J439 Emphysema, unspecified: Secondary | ICD-10-CM

## 2024-04-18 DIAGNOSIS — F17210 Nicotine dependence, cigarettes, uncomplicated: Secondary | ICD-10-CM

## 2024-05-20 ENCOUNTER — Ambulatory Visit
Admission: RE | Admit: 2024-05-20 | Discharge: 2024-05-20 | Disposition: A | Discharge status: Home / Self Care | Source: Ambulatory Visit | Attending: Family Medicine | Admitting: Family Medicine

## 2024-05-20 DIAGNOSIS — F17210 Nicotine dependence, cigarettes, uncomplicated: Secondary | ICD-10-CM

## 2024-05-20 DIAGNOSIS — J439 Emphysema, unspecified: Secondary | ICD-10-CM

## 2024-05-24 ENCOUNTER — Ambulatory Visit: Payer: 59 | Attending: Internal Medicine | Admitting: Internal Medicine

## 2024-05-24 ENCOUNTER — Encounter: Payer: Self-pay | Admitting: Internal Medicine

## 2024-05-24 VITALS — BP 108/76 | Ht 67.0 in | Wt 217.2 lb

## 2024-05-24 DIAGNOSIS — I2510 Atherosclerotic heart disease of native coronary artery without angina pectoris: Secondary | ICD-10-CM

## 2024-05-24 DIAGNOSIS — I10 Essential (primary) hypertension: Secondary | ICD-10-CM | POA: Diagnosis not present

## 2024-05-24 DIAGNOSIS — E785 Hyperlipidemia, unspecified: Secondary | ICD-10-CM | POA: Diagnosis not present

## 2024-05-24 DIAGNOSIS — Z9861 Coronary angioplasty status: Secondary | ICD-10-CM

## 2024-05-24 DIAGNOSIS — I350 Nonrheumatic aortic (valve) stenosis: Secondary | ICD-10-CM

## 2024-05-24 NOTE — Patient Instructions (Addendum)
 Medication Instructions:  Your physician recommends that you continue on your current medications as directed. Please refer to the Current Medication list given to you today.  *If you need a refill on your cardiac medications before your next appointment, please call your pharmacy*  Follow-Up: At [facility], you and your health needs are our priority.  As part of our continuing mission to provide you with exceptional heart care, our providers are all part of one team.  This team includes your primary Cardiologist (physician) and Advanced Practice Providers or APPs (Physician Assistants and Nurse Practitioners) who all work together to provide you with the care you need, when you need it.  Your next appointment:   1 year with Dr. Mona   Other Instructions Schedule Repeat Echo prior to 1 year follow up- order placed in chart

## 2024-05-24 NOTE — Progress Notes (Unsigned)
 OFFICE NOTE  Chief Complaint:  Follow-up  Primary Care Physician: Haze Kingfisher, MD  HPI:  Emma Bonilla is a pleasant 68 year old female is currently referred to me from the internal medicine clinic for evaluation of palpitations. [name] has a strong family history of heart disease with both her mother who had congestive heart failure in her father who had died of an MI. She has a personal history of hypertension, dyslipidemia, about a 40-50 pack year smoking history and a known murmur. She presents for evaluation of palpitations. She reports this is been going on for about 2 months and mostly takes place at rest. She notices at night. There are episodes were heart races for several seconds and then goes back to normal. She said she's had 2 or 3 of these episodes in the past month. She does report a prior history of TIA in 2010 and started taking aspirin  after that. She has good control of her cholesterol on Crestor . She denies any chest pain or worsening shortness of breath. She's never had presyncope or syncopal symptoms.  [name] returns today for follow-up of echo and monitor. The echocardiogram shows an EF of 65-70% with diastolic dysfunction. Additionally there was mild aortic stenosis with a functionally bicuspid valve which is moderately calcified. There was moderate regurgitation. The mean gradient was 13 mmHg. She also had a monitor which demonstrated PACs and PVCs but otherwise sinus rhythm. No A. fib or ventricular arrhythmias were noted. She reports her palpitations have picked up some since she wore the monitor however suspect it's more of the same. She denies any cardiac sounding chest pain or worsening shortness of breath with exertion although I have a high retest probability of coronary disease.  I saw [name] back today in follow-up. She has since been started on low-dose beta blocker and is tolerating it well. She says this is significantly help with her  palpitations and she has no more symptoms.  06/16/2016  [name] was seen back today in follow-up. She denies any worsening palpitations. EKG shows normal sinus rhythm. Unfortunate she's had close to 10 pound weight gain. She attributed this to stopping smoking, which I commended her on. I have however encourage her to work on some more exercise.  01/19/2017  [name] was seen today in follow-up. She recently got the news that her sister has stage IV colon cancer. Based on these findings she is concerned that she may also have colon cancer and has scheduled an upcoming colonoscopy on February 26. Given her establish cardiac history, preoperative risk assessment was recommended. She last had an echo more than a year ago which showed mild aortic stenosis and a functionally bicuspid aortic valve with moderate aortic insufficiency. LVEF is 65-70%. She also had hypertension which was not adequately controlled however blood pressure today is improved at 134/78. She denies any chest pain or worsening shortness of breath.  11/19/2017  [name] returns today for follow-up of her hospitalization.  She recently was admitted with unstable angina and underwent left heart catheterization by Dr. Anner on 10/13/2017.  This demonstrated percent stenosis to the mid RCA and 70% proximal RCA stenosis.  She underwent extensive overlapping 3.0 and 3.25 x 38 mm drug-eluting stents.  LVEF was preserved at 60%.  It was noted she has mild aortic valve stenosis with a peak gradient between 25 and 30 mmHg.  Since discharge she has had marked improvement in her symptoms.  She denies any significant shortness of  breath.  She occasionally gets chest discomfort which is improved with Tylenol  and worse with lifting that sounds musculoskeletal.  She is scheduled to start cardiac rehab on January 14.  She has had no side effects of high-dose atorvastatin , which was started in the hospital.  In addition I increased her losartan   and blood pressure is at goal today 132/89.  The good news is she reported she stopped smoking about 4 days ago.  She says she is going to be committed to this.  She is concerned because her sister has a LVAD.  05/03/2018  [name] returns today for follow-up.  She was recently hospitalized again with chest pain.  She ruled out for MI.  It was felt that her symptoms were related to reflux.  She had worsening belching and reported she ran out of her pantoprazole .  This was reordered however it only 20 mg.  She was previously taking 40 mg.  She denies any worsening symptoms.  Is been more than a year since her last echo and she was noted to have at least mild aortic stenosis.  Her murmur is difficult to auscultate.  05/30/2022  [name] returns today for follow-up.  Overall she says she is doing well.  About 6 weeks ago she had a spike in her blood pressure.  She does have a diagnosis of obstructive sleep apnea but has been waiting for about 2 years to get equipment.  She said she was just contacted yesterday.  Her primary care provider added amlodipine  and blood pressure appears to be better controlled today 128/64.  She also had an increase in her metoprolol .  She denies any chest pain or worsening shortness of breath.  She did have an echo in 2022 which showed mild aortic stenosis.  PMHx:  Past Medical History:  Diagnosis Date   Anemia    Arthritis    Depression    Diastolic dysfunction without heart failure    Family history of rectal cancer    GERD (gastroesophageal reflux disease)    Hepatic steatosis    a. seen on prior US .   Hyperlipidemia    Hypertension    Mild aortic regurgitation    Mild aortic stenosis    Premature atrial contractions    PVC's (premature ventricular contractions)    Thyroid  disease    TIA (transient ischemic attack)    2010   Tobacco abuse     Past Surgical History:  Procedure Laterality Date   CORONARY STENT INTERVENTION N/A 10/13/2017    Procedure: CORONARY STENT INTERVENTION;  Surgeon: Anner Alm ORN, MD;  Location: [facility] INVASIVE CV LAB;  Service: Cardiovascular;  Laterality: N/A;   DILATION AND CURETTAGE OF UTERUS     30 years ago   FOOT SURGERY     LEFT HEART CATH AND CORONARY ANGIOGRAPHY N/A 10/13/2017   Procedure: LEFT HEART CATH AND CORONARY ANGIOGRAPHY;  Surgeon: Anner Alm ORN, MD;  Location: [facility] INVASIVE CV LAB;  Service: Cardiovascular;  Laterality: N/A;    FAMHx:  Family History  Problem Relation Age of Onset   Hypertension Mother    Emphysema Mother    Cancer Mother        lung   Heart failure Mother        CHF   Heart disease Father        MI at 29   Hypertension Father    Crohn's disease Father    Heart disease Sister        leaky  valve   Hypertension Sister    Colon cancer Sister        58 Mets   Hypertension Sister    Hyperlipidemia Sister    Heart disease Sister        LVAD 2014-MI in her 36s   Hypertension Sister    Crohn's disease Sister    Congestive Heart Failure Sister    Cancer Sister    Hyperlipidemia Sister    Skin cancer Maternal Grandmother    Stroke Maternal Grandfather    Diabetes Brother    Liver cancer Brother    Hypertension Child    Hyperlipidemia Child    Hyperlipidemia Child    Pancreatic cancer Neg Hx    Stomach cancer Neg Hx    Esophageal cancer Neg Hx    Breast cancer Neg Hx     SOCHx:   reports that she has been smoking cigarettes. She has a 22.5 pack-year smoking history. She has never used smokeless tobacco. She reports current alcohol use. She reports current drug use. Frequency: 1.00 time per week. Drug: Marijuana.  ALLERGIES:  Allergies  Allergen Reactions   Naproxen Sodium Rash    ROS: Pertinent items noted in HPI and remainder of comprehensive ROS otherwise negative.  HOME MEDS: Current Outpatient Medications  Medication Sig Dispense Refill   amLODipine  (NORVASC ) 5 MG tablet Take 1 tablet (5 mg total) by mouth daily. 90 tablet 3    atorvastatin  (LIPITOR) 40 MG tablet TAKE 1 TABLET BY MOUTH ONCE DAILY AT 6 PM 90 tablet 3   buPROPion  (WELLBUTRIN  SR) 150 MG 12 hr tablet TAKE 1 TABLET (150MG  TOTAL) BY MOUTH 2 TIMES A DAY 180 tablet 3   buPROPion  (WELLBUTRIN  SR) 200 MG 12 hr tablet Take 200 mg by mouth 2 (two) times daily.     calcium -vitamin D  (OSCAL WITH D) 500-200 MG-UNIT per tablet Take 1 tablet by mouth daily. 30 tablet 3   cetirizine  (ZYRTEC  ALLERGY) 10 MG tablet Take 1 tablet (10 mg total) by mouth daily. 90 tablet 3   clopidogrel  (PLAVIX ) 75 MG tablet Take 1 tablet (75 mg total) by mouth daily with breakfast. 90 tablet 3   ezetimibe  (ZETIA ) 10 MG tablet TAKE 1 TABLET BY MOUTH DAILY (Patient taking differently: Take 10 mg by mouth at bedtime.) 100 tablet 2   ferrous sulfate  325 (65 FE) MG tablet Take 1 tablet (325 mg total) by mouth daily. 30 tablet 3   fluticasone  (FLONASE ) 50 MCG/ACT nasal spray Place 2 sprays into both nostrils daily.     GOODSENSE ALLER-EASE 180 MG tablet Take 180 mg by mouth at bedtime.     levocetirizine (XYZAL) 5 MG tablet Take 5 mg by mouth every morning.     levothyroxine  (SYNTHROID ) 100 MCG tablet TAKE 1 TABLET ( TOTAL) BY MOUTH DAILY. 90 tablet 0   losartan -hydrochlorothiazide  (HYZAAR) 100-25 MG tablet Take 1 tablet by mouth daily. 90 tablet 3   Melatonin 3 MG TABS Take 3 mg by mouth at bedtime.     metoprolol  succinate (TOPROL -XL) 25 MG 24 hr tablet Take 1 tablet (25 mg total) by mouth daily. 90 tablet 3   nitroGLYCERIN  (NITROSTAT ) 0.4 MG SL tablet Place 1 tablet (0.4 mg total) under the tongue every 5 (five) minutes as needed for chest pain. 75 tablet 2   omeprazole  (PRILOSEC) 40 MG capsule Take 1 capsule (40 mg total) by mouth 2 (two) times daily. 180 capsule 3   OZEMPIC, 0.25 OR 0.5 MG/DOSE, 2 MG/3ML SOPN Inject 3  mLs as directed once a week.     PAZEO 0.7 % SOLN PLACE 1 DROP TO EYE DAILY. (Patient taking differently: Place 1 drop into both eyes daily.) 7.5 mL 0   SPIRIVA RESPIMAT 2.5  MCG/ACT AERS Inhale 2 Inhalations into the lungs daily.     spironolactone (ALDACTONE) 25 MG tablet Take 12.5 mg by mouth every morning.     tiZANidine (ZANAFLEX) 2 MG tablet Take 2 mg by mouth 2 (two) times daily as needed.     VENTOLIN  HFA 108 (90 Base) MCG/ACT inhaler Inhale 2 puffs into the lungs every 4 (four) hours as needed for shortness of breath or wheezing.     No current facility-administered medications for this visit.    LABS/IMAGING: No results found for this or any previous visit (from the past 48 hours). No results found.  WEIGHTS: Wt Readings from Last 3 Encounters:  05/24/24 217 lb 3.2 oz (98.5 kg)  12/03/23 218 lb (98.9 kg)  07/23/23 220 lb 9.6 oz (100.1 kg)    VITALS: BP 108/76 (BP Location: Left Arm, Patient Position: Sitting, Cuff Size: Large)   Ht 5' 7 (1.702 m)   Wt 217 lb 3.2 oz (98.5 kg)   LMP 02/11/2011 Comment: no chance of pregnancy per pt  SpO2 98%   BMI 34.02 kg/m   EXAM: General appearance: alert and no distress Neck: no carotid bruit, no JVD and thyroid  not enlarged, symmetric, no tenderness/mass/nodules Lungs: clear to auscultation bilaterally Heart: regular rate and rhythm, systolic murmur: early systolic 2/6, crescendo at 2nd right intercostal space and diastolic murmur: mid diastolic 2/6, blowing at lower left sternal border Abdomen: soft, non-tender; bowel sounds normal; no masses,  no organomegaly and obese Extremities: extremities normal, atraumatic, no cyanosis or edema Pulses: 2+ and symmetric Skin: Skin color, texture, turgor normal. No rashes or lesions Neurologic: Grossly normal Psych: Pleasant  EKG: EKG Interpretation Date/Time:  Tuesday May 24 2024 14:55:30 EDT Ventricular Rate:  68 PR Interval:  186 QRS Duration:  82 QT Interval:  404 QTC Calculation: 429 R Axis:   -14  Text Interpretation: Normal sinus rhythm Possible Inferior infarct , age undetermined When compared with ECG of 23-Jul-2023 10:49, No significant  change was found Confirmed by Mona Kent [id number]) on 05/24/2024 3:04:29 PM    ASSESSMENT: Unstable angina - s/p PCI to the RCA with overlapping DES (10/2017) Palpitations - PACs and PVCs - resolved with beta blocker Mild aortic stenosis with a functionally bicuspid valve, moderate aortic insufficiency, EF 65-70% Hypertension Dyslipidemia GERD  PLAN: 1.   [name] seems to be doing well now with better blood pressure control after her primary care provider had added additional medications.  She is overdue for repeat lipid which was last in April 2022 demonstrating a well-controlled LDL of 61.  We will go ahead and order that today as she is fasting.  Her aortic stenosis murmur is stable however I will like to repeat that prior to her next visit in 1 year.  Otherwise no medication changes today.  Plan follow-up with me annually or sooner as necessary.  Kent KYM Mona, MD, [facility], FACP  [place]  [facility]  Medical Director of the [facility]  Cardiovascular Risk Reduction Clinic Attending Cardiologist  Direct Dial: [phone]  Fax: [phone]  Website:  [email or web address] Kent BROCKS Aloysious Vangieson 05/24/2024, 3:04 PM

## 2024-06-08 ENCOUNTER — Encounter: Payer: Self-pay | Admitting: Physical Therapy

## 2024-06-08 ENCOUNTER — Other Ambulatory Visit: Payer: Self-pay

## 2024-06-08 ENCOUNTER — Ambulatory Visit: Attending: Family Medicine | Admitting: Physical Therapy

## 2024-06-08 DIAGNOSIS — M6281 Muscle weakness (generalized): Secondary | ICD-10-CM | POA: Diagnosis present

## 2024-06-08 DIAGNOSIS — R2681 Unsteadiness on feet: Secondary | ICD-10-CM | POA: Insufficient documentation

## 2024-06-08 DIAGNOSIS — R262 Difficulty in walking, not elsewhere classified: Secondary | ICD-10-CM | POA: Diagnosis present

## 2024-06-08 NOTE — Therapy (Signed)
 OUTPATIENT PHYSICAL THERAPY LOWER EXTREMITY EVALUATION   Patient Name: Emma Bonilla MRN: [id number] DOB:[birth year], 68 y.o., female Today's Date: 06/08/2024  END OF SESSION:  PT End of Session - 06/08/24 1103     Visit Number 1    Number of Visits 9    Date for PT Re-Evaluation 07/06/24    Authorization Type [employer] Dual    Authorization Time Period 06/08/24 to 07/20/24    Progress Note Due on Visit 10    PT Start Time 1102    PT Stop Time 1143    PT Time Calculation (min) 41 min    Activity Tolerance Patient tolerated treatment well    Behavior During Therapy WFL for tasks assessed/performed          Past Medical History:  Diagnosis Date   Anemia    Arthritis    Depression    Diastolic dysfunction without heart failure    Family history of rectal cancer    GERD (gastroesophageal reflux disease)    Hepatic steatosis    a. seen on prior US .   Hyperlipidemia    Hypertension    Mild aortic regurgitation    Mild aortic stenosis    Premature atrial contractions    PVC's (premature ventricular contractions)    Thyroid  disease    TIA (transient ischemic attack)    2010   Tobacco abuse    Past Surgical History:  Procedure Laterality Date   CORONARY STENT INTERVENTION N/A 10/13/2017   Procedure: CORONARY STENT INTERVENTION;  Surgeon: Anner Alm ORN, MD;  Location: [facility] INVASIVE CV LAB;  Service: Cardiovascular;  Laterality: N/A;   DILATION AND CURETTAGE OF UTERUS     30 years ago   FOOT SURGERY     LEFT HEART CATH AND CORONARY ANGIOGRAPHY N/A 10/13/2017   Procedure: LEFT HEART CATH AND CORONARY ANGIOGRAPHY;  Surgeon: Anner Alm ORN, MD;  Location: [facility] INVASIVE CV LAB;  Service: Cardiovascular;  Laterality: N/A;   Patient Active Problem List   Diagnosis Date Noted   Irregular bowel habits 06/12/2022   Preventive measure 05/19/2022   Type 2 diabetes mellitus, controlled (HCC) 04/30/2022   Family history of rectal cancer 01/16/2022   Chronic insomnia 01/16/2022    Hepatic steatosis 04/30/2021   Hx of adenomatous colonic polyps 04/18/2021   Patellar subluxation 03/03/2021   Numbness and tingling in both hands 04/23/2020   Obstructive sleep apnea hypopnea, moderate 06/20/2019   Atypical chest pain 04/17/2018   Obesity, Class II, BMI 35-39.9 10/13/2017   CAD S/P percutaneous coronary angioplasty 10/2017 10/12/2017   Iron deficiency anemia 03/27/2017   Lipoma 02/14/2017   Internal hemorrhoids 02/13/2017   TIA (transient ischemic attack) 08/21/2015   Aortic valve stenosis 06/11/2015   Dental caries 05/09/2013   Perennial allergic rhinitis 04/03/2012   Hypothyroidism 03/30/2012   Osteoarthritis 11/07/2011   Antiplatelet or antithrombotic long-term use 09/01/2011   Essential hypertension 07/25/2011   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 Sometimes it comes from my hip  giving out, sometimes I don't know why I feel unsteady. Hip has felt like it'll give out off and on for the past couple of years. Have some lower back pain.   PERTINENT HISTORY: See above  PAIN:  Are you having pain? No  PRECAUTIONS: None  RED FLAGS: None   WEIGHT BEARING RESTRICTIONS: No  FALLS:  Has patient fallen in last 6 months? Yes. Number of falls 2- was just walking and fell   LIVING ENVIRONMENT: Lives with: lives with their family Lives in: House/apartment    OCCUPATION: retired- worked in [employer]    PLOF: Independent, Independent with basic ADLs, Independent with gait, and Independent with transfers  PATIENT GOALS: I'm here because the doctor sent me   NEXT MD VISIT: Referring PRN   OBJECTIVE:  Note: Objective measures were completed at Evaluation unless otherwise noted.    PATIENT SURVEYS:    THE PATIENT SPECIFIC FUNCTIONAL SCALE  Place score of 0-10 (0 = unable to perform activity and 10 = able to perform activity at the same level as before injury or problem)  Activity Date: Eval 06/08/24    Standing more than 15-20 minutes  5    2.      3.     4.      Total Score 5      Total Score = Sum of activity scores/number of activities  Minimally Detectable Change: 3 points (for single activity); 2 points (for average score)  [facility] (nd). The Patient Specific Functional Scale . Retrieved from [email or web address]   COGNITION: Overall cognitive status: Within functional limits for tasks assessed     SENSATION: WFL     LOWER EXTREMITY MMT:  MMT Right eval Left eval  Hip flexion 4+ 4+  Hip extension    Hip abduction 3- seated  3- seated   Hip adduction    Hip internal rotation    Hip external rotation    Knee flexion 4+ 4+  Knee extension 4+ 4+  Ankle dorsiflexion 5 5  Ankle plantarflexion    Ankle inversion    Ankle eversion     (Blank rows = not tested)    FUNCTIONAL TESTS:  5 times sit to stand: 17.4 seconds with posterior LOB on last rep  3 minute walk test: 463ft  Dynamic Gait Index: Dynamic Gait Index  Mark the lowest level that applies.   Date Performed 06/08/24 (eval)  Gait level surface (3) Normal: walks 20', no AD, good speed, no evidence for imbalance, normal gait pattern  2. Change in gait speed (3) Normal: Able to smoothly change walking speed without loss of balance or gait deviation. Shows a significant difference in walking speeds between normal, fast and slow  speeds  3. Gait with horizontal head turns (2) Mild Impairment: Performs head turns smoothly with slight change in gait velocity, i.e., minor disruption to smooth gait path or uses walking aid  4. Gait with vertical head turns (2) Mild Impairment: Performs head turns smoothly with slight change in gait velocity, i.e., minor disruption to smooth gait path or uses walking aid  5. Gait and pivot turn (1) Moderate Impairment: Turns slowly, requires verbal cueing, requires several small steps to catch balance following turn and stop  6. Step over obstacle (2) Mild Impairment: Is able to step over box, but must slow down and adjust steps to clear box safely  7. Step around obstacle (2) Mild Impairment: Is able to step around both cones, but  must slow down and adjust steps to clear cones  8. Steps (2) Mild Impairment: Alternating feet, must use rail  Total score 17    Score Interpretation: Score of <19 indicates high risk of falls.  Minimally Clinically Important Difference (MCID):  =DGI scores of<21/24 = 1.80 points DGI scores of >21/24 = 0.60 points   Hamilton T, Inbar-Borovsky N, Brozgol M, Giladi N, [place] JM. The Dynamic Gait Index in healthy older adults: the role of stair climbing, fear of falling and gender. Gait Posture. 2009 Feb;29(2):237-41. doi: 10.1016/j.gaitpost.2008.08.013. Epub 2008 Oct 8. PMID: 81154560; PMCID: EFR7290501.  Pardasaney, MYRTIS LOIS Bonus, GEANNIE POUR., et al. (2012). Sensitivity to change and responsiveness of four balance measures for community-dwelling older adults. Physical therapy 92(3): 388-397.   GAIT: Distance walked: 477ft Assistive device utilized: None Level of assistance: Complete Independence Comments: mild unsteadiness noted when fatigued increased                                                                                                                                 TREATMENT DATE:   06/08/24  Eval, POC, HEP and education as below   Nustep L4  seat 9 x7minutes all four extremities       PATIENT EDUCATION:  Education details: exam findings, POC, HEP  Person educated: Patient Education method: Programmer, multimedia, Demonstration, and Handouts Education comprehension: verbalized understanding, returned demonstration, and needs further education  HOME EXERCISE PROGRAM: Access Code: [email or web address] URL: [email or web address] Date: 06/08/2024 Prepared by: Josette Rough  Exercises - Standing Hip Abduction with Resistance at Thighs  - 1 x daily - 5 x weekly - 2 sets - 10 reps - Tandem Stance in Corner  - 1 x daily - 7 x weekly - 2 sets - 6 reps - 15-30 seconds  hold - Standing Single Leg Stance with Counter Support  - 1 x daily - 7 x weekly - 2 sets - 6 reps - 15-30 seconds  hold  ASSESSMENT:  CLINICAL IMPRESSION: Patient is a 68 y.o. F who was seen today for physical therapy evaluation and treatment for  Diagnosis  R26.81 (ICD-10-CM) - Unsteadiness on feet  . Does show some severe hip abductor weakness as well as general balance impairment especially dynamically. Very sedentary and not too interested in getting to the gym. Will trial PT, if she is happy with the results of that period and making good progress, we can certainly consider extending POC at that point.   OBJECTIVE IMPAIRMENTS: Abnormal gait, decreased activity tolerance, decreased balance, decreased mobility, decreased strength, and obesity.   ACTIVITY LIMITATIONS: standing, stairs, transfers, and locomotion level  PARTICIPATION LIMITATIONS: driving, shopping, community activity, and yard work  PERSONAL FACTORS: Age, Behavior pattern, Education, Fitness, Past/current experiences, Social background, and Time since onset of injury/illness/exacerbation are also affecting patient's functional outcome.   REHAB POTENTIAL: Fair sedentary lifestyle, limited buy in to PT, chronicity of impairments   CLINICAL DECISION MAKING: Stable/uncomplicated  EVALUATION COMPLEXITY:  Low   GOALS: Goals reviewed with patient? No  SHORT TERM GOALS: Target date: STGs = LTGs      LONG TERM GOALS: Target date: 07/06/2024    MMT in hip abductors to be at least 4/5 B Baseline:  Goal status: INITIAL  2.  DGI to be at least 20 to show improved functional balance Baseline:  Goal status: INITIAL  3. Will ambulate at least 516ft in with RPE no more than 3/10 to show improved activity tolerance and community access  Baseline:  Goal status: INITIAL  4.  Will be able to stand for 30 minutes without difficulty  Baseline:  Goal status: INITIAL  5.  PSFS to have improved by 2 points  Baseline:  Goal status: INITIAL     PLAN:  PT FREQUENCY: 2x/week  PT DURATION: 4 weeks  PLANNED INTERVENTIONS: 97750- Physical Performance Testing, 97110-Therapeutic exercises, 97530- Therapeutic activity, 97112- Neuromuscular re-education, 97535- Self Care, [id number]- Manual therapy, and [id number]- Gait training  PLAN FOR NEXT SESSION: hip abductor and extensor strength, balance work; trial PT for short POC, if making progress and if she would like to continue we can extend at that point   Josette Rough, PT, DPT 06/08/24 11:44 AM

## 2024-06-14 ENCOUNTER — Ambulatory Visit: Admitting: Physical Therapy

## 2024-06-14 DIAGNOSIS — M6281 Muscle weakness (generalized): Secondary | ICD-10-CM

## 2024-06-14 DIAGNOSIS — R2681 Unsteadiness on feet: Secondary | ICD-10-CM | POA: Diagnosis not present

## 2024-06-14 DIAGNOSIS — R262 Difficulty in walking, not elsewhere classified: Secondary | ICD-10-CM

## 2024-06-14 NOTE — Therapy (Signed)
 OUTPATIENT PHYSICAL THERAPY LOWER EXTREMITY    Patient Name: Emma Bonilla MRN: [id number] DOB:[birth year], 68 y.o., female Today's Date: 06/14/2024  END OF SESSION:  PT End of Session - 06/14/24 1301     Visit Number 2    Number of Visits 9    Date for PT Re-Evaluation 07/06/24    Authorization Type [employer] Dual    Authorization Time Period 06/08/24 to 07/20/24    PT Start Time 1310    PT Stop Time 1350    PT Time Calculation (min) 40 min          Past Medical History:  Diagnosis Date   Anemia    Arthritis    Depression    Diastolic dysfunction without heart failure    Family history of rectal cancer    GERD (gastroesophageal reflux disease)    Hepatic steatosis    a. seen on prior US .   Hyperlipidemia    Hypertension    Mild aortic regurgitation    Mild aortic stenosis    Premature atrial contractions    PVC's (premature ventricular contractions)    Thyroid  disease    TIA (transient ischemic attack)    2010   Tobacco abuse    Past Surgical History:  Procedure Laterality Date   CORONARY STENT INTERVENTION N/A 10/13/2017   Procedure: CORONARY STENT INTERVENTION;  Surgeon: Anner Alm ORN, MD;  Location: [facility] INVASIVE CV LAB;  Service: Cardiovascular;  Laterality: N/A;   DILATION AND CURETTAGE OF UTERUS     30 years ago   FOOT SURGERY     LEFT HEART CATH AND CORONARY ANGIOGRAPHY N/A 10/13/2017   Procedure: LEFT HEART CATH AND CORONARY ANGIOGRAPHY;  Surgeon: Anner Alm ORN, MD;  Location: [facility] INVASIVE CV LAB;  Service: Cardiovascular;  Laterality: N/A;   Patient Active Problem List   Diagnosis Date Noted   Irregular bowel habits 06/12/2022   Preventive measure 05/19/2022   Type 2 diabetes mellitus, controlled (HCC) 04/30/2022   Family history of rectal cancer 01/16/2022   Chronic insomnia 01/16/2022   Hepatic steatosis 04/30/2021   Hx of adenomatous colonic polyps 04/18/2021   Patellar subluxation 03/03/2021   Numbness and tingling in both hands 04/23/2020    Obstructive sleep apnea hypopnea, moderate 06/20/2019   Atypical chest pain 04/17/2018   Obesity, Class II, BMI 35-39.9 10/13/2017   CAD S/P percutaneous coronary angioplasty 10/2017 10/12/2017   Iron deficiency anemia 03/27/2017   Lipoma 02/14/2017   Internal hemorrhoids 02/13/2017   TIA (transient ischemic attack) 08/21/2015   Aortic valve stenosis 06/11/2015   Dental caries 05/09/2013   Perennial allergic rhinitis 04/03/2012   Hypothyroidism 03/30/2012   Osteoarthritis 11/07/2011   Antiplatelet or antithrombotic long-term use 09/01/2011   Essential hypertension 07/25/2011   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   Gastroesophageal reflux disease 07/25/2011   Tobacco abuse 07/25/2011   Hyperlipidemia 07/25/2011    PCP: Haze Kingfisher, MD  REFERRING PROVIDER: Haze Kingfisher, MD  REFERRING DIAG:  Diagnosis  R26.81 (ICD-10-CM) - Unsteadiness on feet    THERAPY DIAG:  Unsteadiness on feet  Muscle weakness (generalized)  Difficulty in walking, not elsewhere classified  Rationale for Evaluation and Treatment: Rehabilitation  ONSET DATE: chronic, had TIA in 2010  SUBJECTIVE:   SUBJECTIVE STATEMENT: Pt verb doing HEP. States she was at a wedding this weekend legs gave away, husband prevented her fall   Feel off balance, not sure why. Sometimes it comes from my hip giving out, sometimes I don't know why I  feel unsteady. Hip has felt like it'll give out off and on for the past couple of years. Have some lower back pain.   PERTINENT HISTORY: See above  PAIN:  Are you having pain? No  PRECAUTIONS: None  RED FLAGS: None   WEIGHT BEARING RESTRICTIONS: No  FALLS:  Has patient fallen in last 6 months? Yes. Number of falls 2- was just walking and fell   LIVING ENVIRONMENT: Lives with: lives with their family Lives in: House/apartment    OCCUPATION: retired- worked in [employer]   PLOF: Independent, Independent with basic ADLs,  Independent with gait, and Independent with transfers  PATIENT GOALS: I'm here because the doctor sent me   NEXT MD VISIT: Referring PRN   OBJECTIVE:  Note: Objective measures were completed at Evaluation unless otherwise noted.    PATIENT SURVEYS:    THE PATIENT SPECIFIC FUNCTIONAL SCALE  Place score of 0-10 (0 = unable to perform activity and 10 = able to perform activity at the same level as before injury or problem)  Activity Date: Eval 06/08/24    Standing more than 15-20 minutes  5    2.      3.     4.      Total Score 5      Total Score = Sum of activity scores/number of activities  Minimally Detectable Change: 3 points (for single activity); 2 points (for average score)  [facility] (nd). The Patient Specific Functional Scale . Retrieved from [email or web address]   COGNITION: Overall cognitive status: Within functional limits for tasks assessed     SENSATION: WFL     LOWER EXTREMITY MMT:  MMT Right eval Left eval  Hip flexion 4+ 4+  Hip extension    Hip abduction 3- seated  3- seated   Hip adduction    Hip internal rotation    Hip external rotation    Knee flexion 4+ 4+  Knee extension 4+ 4+  Ankle dorsiflexion 5 5  Ankle plantarflexion    Ankle inversion    Ankle eversion     (Blank rows = not tested)    FUNCTIONAL TESTS:  5 times sit to stand: 17.4 seconds with posterior LOB on last rep  3 minute walk test: 424ft  Dynamic Gait Index: Dynamic Gait Index  Mark the lowest level that applies.   Date Performed 06/08/24 (eval)  Gait level surface (3) Normal: walks 20', no AD, good speed, no evidence for imbalance, normal gait pattern  2. Change in gait speed (3) Normal: Able to smoothly change walking speed without loss of balance or gait deviation. Shows a significant difference in walking speeds between normal, fast and slow speeds  3. Gait with horizontal head turns (2) Mild  Impairment: Performs head turns smoothly with slight change in gait velocity, i.e., minor disruption to smooth gait path or uses walking aid  4. Gait with vertical head turns (2) Mild Impairment: Performs head turns smoothly with slight change in gait velocity, i.e., minor disruption to smooth gait path or uses walking aid  5. Gait and pivot turn (1) Moderate Impairment: Turns slowly, requires verbal cueing, requires several small steps to catch balance following turn and stop  6. Step over obstacle (2) Mild Impairment: Is able to step over box, but must slow down and adjust steps to clear box safely  7. Step around obstacle (2) Mild Impairment: Is able to step around both cones, but must slow down and adjust steps to clear  cones  8. Steps (2) Mild Impairment: Alternating feet, must use rail  Total score 17    Score Interpretation: Score of <19 indicates high risk of falls.  Minimally Clinically Important Difference (MCID):  =DGI scores of<21/24 = 1.80 points DGI scores of >21/24 = 0.60 points   Castana T, Inbar-Borovsky N, Brozgol M, Giladi N, [place] JM. The Dynamic Gait Index in healthy older adults: the role of stair climbing, fear of falling and gender. Gait Posture. 2009 Feb;29(2):237-41. doi: 10.1016/j.gaitpost.2008.08.013. Epub 2008 Oct 8. PMID: 81154560; PMCID: EFR7290501.  Pardasaney, MYRTIS LOIS Bonus, GEANNIE POUR., et al. (2012). Sensitivity to change and responsiveness of four balance measures for community-dwelling older adults. Physical therapy 92(3): 388-397.   GAIT: Distance walked: 425ft Assistive device utilized: None Level of assistance: Complete Independence Comments: mild unsteadiness noted when fatigued increased                                                                                                                                 TREATMENT DATE:   06/14/24 Nustep L 4 ADD ball squeeze hold 3 sec 2 sets 10 Green tband clams 2 sets 10-issued for HEP Green  tband hip flex 2 sets 10-issued for HEP STS with 3# chest press 10 x Knee ext 10# 2 sets 8 HS curl 20# 2 sets 10 Resisted gait 20 # fwd and back 5 x each,laterally 3 x each Foam beam side stepping in// bars, then tandem fwd and back with UE 3 x each   06/08/24  Eval, POC, HEP and education as below   Nustep L4 seat 9 x68minutes all four extremities       PATIENT EDUCATION:  Education details: exam findings, POC, HEP  Person educated: Patient Education method: Programmer, multimedia, Demonstration, and Handouts Education comprehension: verbalized understanding, returned demonstration, and needs further education  HOME EXERCISE PROGRAM: Access Code: [email or web address] URL: [email or web address] Date: 06/08/2024 Prepared by: Josette Rough  Exercises - Standing Hip Abduction with Resistance at Thighs  - 1 x daily - 5 x weekly - 2 sets - 10 reps - Tandem Stance in Corner  - 1 x daily - 7 x weekly - 2 sets - 6 reps - 15-30 seconds  hold - Standing Single Leg Stance with Counter Support  - 1 x daily - 7 x weekly - 2 sets - 6 reps - 15-30 seconds  hold  ASSESSMENT:  CLINICAL IMPRESSION: initial session after PT evaluation. Pt verb doing HEP . Pt states almost fell this weekend, legs gave away.- focus on LE strengthening to help with weakness and balance , tolerated well with cuing .Pt did comment she could feel everything more on RT side , it was more challenging.     Patient is a 68 y.o. F who was seen today for physical therapy evaluation and treatment for  Diagnosis  R26.81 (ICD-10-CM) - Unsteadiness on feet  . Does show some severe hip abductor weakness as well as  general balance impairment especially dynamically. Very sedentary and not too interested in getting to the gym. Will trial PT, if she is happy with the results of that period and making good progress, we can certainly consider extending POC at that point.   OBJECTIVE IMPAIRMENTS: Abnormal gait, decreased activity  tolerance, decreased balance, decreased mobility, decreased strength, and obesity.   ACTIVITY LIMITATIONS: standing, stairs, transfers, and locomotion level  PARTICIPATION LIMITATIONS: driving, shopping, community activity, and yard work  PERSONAL FACTORS: Age, Behavior pattern, Education, Fitness, Past/current experiences, Social background, and Time since onset of injury/illness/exacerbation are also affecting patient's functional outcome.   REHAB POTENTIAL: Fair sedentary lifestyle, limited buy in to PT, chronicity of impairments   CLINICAL DECISION MAKING: Stable/uncomplicated  EVALUATION COMPLEXITY: Low   GOALS: Goals reviewed with patient? No  SHORT TERM GOALS: Target date: STGs = LTGs      LONG TERM GOALS: Target date: 07/06/2024    MMT in hip abductors to be at least 4/5 B Baseline:  Goal status: INITIAL  2.  DGI to be at least 20 to show improved functional balance Baseline:  Goal status: INITIAL  3. Will ambulate at least 575ft in with RPE no more than 3/10 to show improved activity tolerance and community access  Baseline:  Goal status: INITIAL  4.  Will be able to stand for 30 minutes without difficulty  Baseline:  Goal status: INITIAL  5.  PSFS to have improved by 2 points  Baseline:  Goal status: INITIAL     PLAN:  PT FREQUENCY: 2x/week  PT DURATION: 4 weeks  PLANNED INTERVENTIONS: 97750- Physical Performance Testing, 97110-Therapeutic exercises, 97530- Therapeutic activity, 97112- Neuromuscular re-education, 97535- Self Care, 02859- Manual therapy, and 97116- Gait training  PLAN FOR NEXT SESSION: hip abductor and extensor strength, balance work; trial PT for short POC, if making progress and if she would like to continue we can extend at that point   [employer] PTA 06/14/24 1:02 PM

## 2024-06-22 ENCOUNTER — Encounter: Payer: Self-pay | Admitting: Physical Therapy

## 2024-06-22 ENCOUNTER — Ambulatory Visit: Admitting: Physical Therapy

## 2024-06-22 DIAGNOSIS — M6281 Muscle weakness (generalized): Secondary | ICD-10-CM

## 2024-06-22 DIAGNOSIS — R2681 Unsteadiness on feet: Secondary | ICD-10-CM

## 2024-06-22 DIAGNOSIS — R262 Difficulty in walking, not elsewhere classified: Secondary | ICD-10-CM

## 2024-06-22 NOTE — Therapy (Signed)
 OUTPATIENT PHYSICAL THERAPY LOWER EXTREMITY    Patient Name: Emma Bonilla MRN: [id number] DOB:[birth year], 68 y.o., female Today's Date: 06/22/2024  END OF SESSION:  PT End of Session - 06/22/24 1427     Visit Number 3    Date for PT Re-Evaluation 07/06/24    PT Start Time 1430    PT Stop Time 1515    PT Time Calculation (min) 45 min    Activity Tolerance Patient tolerated treatment well    Behavior During Therapy WFL for tasks assessed/performed          Past Medical History:  Diagnosis Date   Anemia    Arthritis    Depression    Diastolic dysfunction without heart failure    Family history of rectal cancer    GERD (gastroesophageal reflux disease)    Hepatic steatosis    a. seen on prior US .   Hyperlipidemia    Hypertension    Mild aortic regurgitation    Mild aortic stenosis    Premature atrial contractions    PVC's (premature ventricular contractions)    Thyroid  disease    TIA (transient ischemic attack)    2010   Tobacco abuse    Past Surgical History:  Procedure Laterality Date   CORONARY STENT INTERVENTION N/A 10/13/2017   Procedure: CORONARY STENT INTERVENTION;  Surgeon: Anner Alm ORN, MD;  Location: [facility] INVASIVE CV LAB;  Service: Cardiovascular;  Laterality: N/A;   DILATION AND CURETTAGE OF UTERUS     30 years ago   FOOT SURGERY     LEFT HEART CATH AND CORONARY ANGIOGRAPHY N/A 10/13/2017   Procedure: LEFT HEART CATH AND CORONARY ANGIOGRAPHY;  Surgeon: Anner Alm ORN, MD;  Location: [facility] INVASIVE CV LAB;  Service: Cardiovascular;  Laterality: N/A;   Patient Active Problem List   Diagnosis Date Noted   Irregular bowel habits 06/12/2022   Preventive measure 05/19/2022   Type 2 diabetes mellitus, controlled (HCC) 04/30/2022   Family history of rectal cancer 01/16/2022   Chronic insomnia 01/16/2022   Hepatic steatosis 04/30/2021   Hx of adenomatous colonic polyps 04/18/2021   Patellar subluxation 03/03/2021   Numbness and tingling in both hands  04/23/2020   Obstructive sleep apnea hypopnea, moderate 06/20/2019   Atypical chest pain 04/17/2018   Obesity, Class II, BMI 35-39.9 10/13/2017   CAD S/P percutaneous coronary angioplasty 10/2017 10/12/2017   Iron deficiency anemia 03/27/2017   Lipoma 02/14/2017   Internal hemorrhoids 02/13/2017   TIA (transient ischemic attack) 08/21/2015   Aortic valve stenosis 06/11/2015   Dental caries 05/09/2013   Perennial allergic rhinitis 04/03/2012   Hypothyroidism 03/30/2012   Osteoarthritis 11/07/2011   Antiplatelet or antithrombotic long-term use 09/01/2011   Essential hypertension 07/25/2011   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   Gastroesophageal reflux disease 07/25/2011   Tobacco abuse 07/25/2011   Hyperlipidemia 07/25/2011    PCP: Haze Kingfisher, MD  REFERRING PROVIDER: Haze Kingfisher, MD  REFERRING DIAG:  Diagnosis  R26.81 (ICD-10-CM) - Unsteadiness on feet    THERAPY DIAG:  Unsteadiness on feet  Muscle weakness (generalized)  Difficulty in walking, not elsewhere classified  Rationale for Evaluation and Treatment: Rehabilitation  ONSET DATE: chronic, had TIA in 2010  SUBJECTIVE:   SUBJECTIVE STATEMENT: R  leg has been hurting ever since last night. Fall last Sunday at a wedding RLE gave out   Feel off balance, not sure why. Sometimes it comes from my hip giving out, sometimes I don't know why I feel unsteady. Hip has  felt like it'll give out off and on for the past couple of years. Have some lower back pain.   PERTINENT HISTORY: See above  PAIN:  Are you having pain? No  PRECAUTIONS: None  RED FLAGS: None   WEIGHT BEARING RESTRICTIONS: No  FALLS:  Has patient fallen in last 6 months? Yes. Number of falls 2- was just walking and fell   LIVING ENVIRONMENT: Lives with: lives with their family Lives in: House/apartment    OCCUPATION: retired- worked in [employer]   PLOF: Independent, Independent with basic ADLs,  Independent with gait, and Independent with transfers  PATIENT GOALS: I'm here because the doctor sent me   NEXT MD VISIT: Referring PRN   OBJECTIVE:  Note: Objective measures were completed at Evaluation unless otherwise noted.    PATIENT SURVEYS:    THE PATIENT SPECIFIC FUNCTIONAL SCALE  Place score of 0-10 (0 = unable to perform activity and 10 = able to perform activity at the same level as before injury or problem)  Activity Date: Eval 06/08/24    Standing more than 15-20 minutes  5    2.      3.     4.      Total Score 5      Total Score = Sum of activity scores/number of activities  Minimally Detectable Change: 3 points (for single activity); 2 points (for average score)  [facility] (nd). The Patient Specific Functional Scale . Retrieved from [email or web address]   COGNITION: Overall cognitive status: Within functional limits for tasks assessed     SENSATION: WFL     LOWER EXTREMITY MMT:  MMT Right eval Left eval  Hip flexion 4+ 4+  Hip extension    Hip abduction 3- seated  3- seated   Hip adduction    Hip internal rotation    Hip external rotation    Knee flexion 4+ 4+  Knee extension 4+ 4+  Ankle dorsiflexion 5 5  Ankle plantarflexion    Ankle inversion    Ankle eversion     (Blank rows = not tested)    FUNCTIONAL TESTS:  5 times sit to stand: 17.4 seconds with posterior LOB on last rep  3 minute walk test: 434ft  Dynamic Gait Index: Dynamic Gait Index  Mark the lowest level that applies.   Date Performed 06/08/24 (eval)  Gait level surface (3) Normal: walks 20', no AD, good speed, no evidence for imbalance, normal gait pattern  2. Change in gait speed (3) Normal: Able to smoothly change walking speed without loss of balance or gait deviation. Shows a significant difference in walking speeds between normal, fast and slow speeds  3. Gait with horizontal head turns (2) Mild  Impairment: Performs head turns smoothly with slight change in gait velocity, i.e., minor disruption to smooth gait path or uses walking aid  4. Gait with vertical head turns (2) Mild Impairment: Performs head turns smoothly with slight change in gait velocity, i.e., minor disruption to smooth gait path or uses walking aid  5. Gait and pivot turn (1) Moderate Impairment: Turns slowly, requires verbal cueing, requires several small steps to catch balance following turn and stop  6. Step over obstacle (2) Mild Impairment: Is able to step over box, but must slow down and adjust steps to clear box safely  7. Step around obstacle (2) Mild Impairment: Is able to step around both cones, but must slow down and adjust steps to clear cones  8. Steps (  2) Mild Impairment: Alternating feet, must use rail  Total score 17    Score Interpretation: Score of <19 indicates high risk of falls.  Minimally Clinically Important Difference (MCID):  =DGI scores of<21/24 = 1.80 points DGI scores of >21/24 = 0.60 points   Arnold T, Inbar-Borovsky N, Brozgol M, Giladi N, [place] JM. The Dynamic Gait Index in healthy older adults: the role of stair climbing, fear of falling and gender. Gait Posture. 2009 Feb;29(2):237-41. doi: 10.1016/j.gaitpost.2008.08.013. Epub 2008 Oct 8. PMID: 81154560; PMCID: EFR7290501.  Pardasaney, MYRTIS LOIS Bonus, GEANNIE POUR., et al. (2012). Sensitivity to change and responsiveness of four balance measures for community-dwelling older adults. Physical therapy 92(3): 388-397.   GAIT: Distance walked: 421ft Assistive device utilized: None Level of assistance: Complete Independence Comments: mild unsteadiness noted when fatigued increased                                                                                                                                 TREATMENT DATE:  06/22/24 NuStep L 5 x 6 min 6in step ups x10 each HS curls 25lb 2x10 Leg Ext 10lb 2x10, 5lb RLE x10 Hip add ball  squeeze 2x15 Resisted side steps 20lb x5 each   06/14/24 Nustep L 4 ADD ball squeeze hold 3 sec 2 sets 10 Green tband clams 2 sets 10-issued for HEP Green tband hip flex 2 sets 10-issued for HEP STS with 3# chest press 10 x Knee ext 10# 2 sets 8 HS curl 20# 2 sets 10 Resisted gait 20 # fwd and back 5 x each,laterally 3 x each Foam beam side stepping in// bars, then tandem fwd and back with UE 3 x each   06/08/24  Eval, POC, HEP and education as below   Nustep L4 seat 9 x15minutes all four extremities       PATIENT EDUCATION:  Education details: exam findings, POC, HEP  Person educated: Patient Education method: Explanation, Demonstration, and Handouts Education comprehension: verbalized understanding, returned demonstration, and needs further education  HOME EXERCISE PROGRAM: Access Code: [email or web address] URL: [email or web address] Date: 06/08/2024 Prepared by: Josette Rough  Exercises - Standing Hip Abduction with Resistance at Thighs  - 1 x daily - 5 x weekly - 2 sets - 10 reps - Tandem Stance in Corner  - 1 x daily - 7 x weekly - 2 sets - 6 reps - 15-30 seconds  hold - Standing Single Leg Stance with Counter Support  - 1 x daily - 7 x weekly - 2 sets - 6 reps - 15-30 seconds  hold  ASSESSMENT:  CLINICAL IMPRESSION: Pt enters doing well, session focus on LE strengthening to help with weakness and balance , tolerated well with cuing. Some instability with step ups and fatigue with sit to stands. CGA required with resisted side steps. Decrease TKE with SL extension      Patient is a 68 y.o. F who was seen today for physical therapy evaluation  and treatment for  Diagnosis  R26.81 (ICD-10-CM) - Unsteadiness on feet  . Does show some severe hip abductor weakness as well as general balance impairment especially dynamically. Very sedentary and not too interested in getting to the gym. Will trial PT, if she is happy with the results of that period and making  good progress, we can certainly consider extending POC at that point.   OBJECTIVE IMPAIRMENTS: Abnormal gait, decreased activity tolerance, decreased balance, decreased mobility, decreased strength, and obesity.   ACTIVITY LIMITATIONS: standing, stairs, transfers, and locomotion level  PARTICIPATION LIMITATIONS: driving, shopping, community activity, and yard work  PERSONAL FACTORS: Age, Behavior pattern, Education, Fitness, Past/current experiences, Social background, and Time since onset of injury/illness/exacerbation are also affecting patient's functional outcome.   REHAB POTENTIAL: Fair sedentary lifestyle, limited buy in to PT, chronicity of impairments   CLINICAL DECISION MAKING: Stable/uncomplicated  EVALUATION COMPLEXITY: Low   GOALS: Goals reviewed with patient? No  SHORT TERM GOALS: Target date: STGs = LTGs      LONG TERM GOALS: Target date: 07/06/2024    MMT in hip abductors to be at least 4/5 B Baseline:  Goal status: INITIAL  2.  DGI to be at least 20 to show improved functional balance Baseline:  Goal status: INITIAL  3. Will ambulate at least 551ft in with RPE no more than 3/10 to show improved activity tolerance and community access  Baseline:  Goal status: INITIAL  4.  Will be able to stand for 30 minutes without difficulty  Baseline:  Goal status: INITIAL  5.  PSFS to have improved by 2 points  Baseline:  Goal status: INITIAL     PLAN:  PT FREQUENCY: 2x/week  PT DURATION: 4 weeks  PLANNED INTERVENTIONS: 97750- Physical Performance Testing, 97110-Therapeutic exercises, 97530- Therapeutic activity, 97112- Neuromuscular re-education, 97535- Self Care, 02859- Manual therapy, and 97116- Gait training  PLAN FOR NEXT SESSION: hip abductor and extensor strength, balance work; trial PT for short POC, if making progress and if she would like to continue we can extend at that point   Tanda [employer] PTA 06/22/24 2:27 PM

## 2024-06-24 ENCOUNTER — Ambulatory Visit: Admitting: Physical Therapy

## 2024-06-24 DIAGNOSIS — R2681 Unsteadiness on feet: Secondary | ICD-10-CM | POA: Diagnosis not present

## 2024-06-24 DIAGNOSIS — R262 Difficulty in walking, not elsewhere classified: Secondary | ICD-10-CM

## 2024-06-24 DIAGNOSIS — M6281 Muscle weakness (generalized): Secondary | ICD-10-CM

## 2024-06-24 NOTE — Therapy (Signed)
 OUTPATIENT PHYSICAL THERAPY LOWER EXTREMITY    Patient Name: Emma Bonilla MRN: [id number] DOB:November 09, 1956, 68 y.o., female Today's Date: 06/24/2024  END OF SESSION:  PT End of Session - 06/24/24 0927     Visit Number 4    Number of Visits 9    Date for PT Re-Evaluation 07/06/24    Authorization Type [employer] Dual    Authorization Time Period 06/08/24 to 07/20/24    PT Start Time 0927    PT Stop Time 1010    PT Time Calculation (min) 43 min          Past Medical History:  Diagnosis Date   Anemia    Arthritis    Depression    Diastolic dysfunction without heart failure    Family history of rectal cancer    GERD (gastroesophageal reflux disease)    Hepatic steatosis    a. seen on prior US .   Hyperlipidemia    Hypertension    Mild aortic regurgitation    Mild aortic stenosis    Premature atrial contractions    PVC's (premature ventricular contractions)    Thyroid  disease    TIA (transient ischemic attack)    2010   Tobacco abuse    Past Surgical History:  Procedure Laterality Date   CORONARY STENT INTERVENTION N/A 10/13/2017   Procedure: CORONARY STENT INTERVENTION;  Surgeon: Anner Alm ORN, MD;  Location: [facility] INVASIVE CV LAB;  Service: Cardiovascular;  Laterality: N/A;   DILATION AND CURETTAGE OF UTERUS     30 years ago   FOOT SURGERY     LEFT HEART CATH AND CORONARY ANGIOGRAPHY N/A 10/13/2017   Procedure: LEFT HEART CATH AND CORONARY ANGIOGRAPHY;  Surgeon: Anner Alm ORN, MD;  Location: [facility] INVASIVE CV LAB;  Service: Cardiovascular;  Laterality: N/A;   Patient Active Problem List   Diagnosis Date Noted   Irregular bowel habits 06/12/2022   Preventive measure 05/19/2022   Type 2 diabetes mellitus, controlled (HCC) 04/30/2022   Family history of rectal cancer 01/16/2022   Chronic insomnia 01/16/2022   Hepatic steatosis 04/30/2021   Hx of adenomatous colonic polyps 04/18/2021   Patellar subluxation 03/03/2021   Numbness and tingling in both hands 04/23/2020    Obstructive sleep apnea hypopnea, moderate 06/20/2019   Atypical chest pain 04/17/2018   Obesity, Class II, BMI 35-39.9 10/13/2017   CAD S/P percutaneous coronary angioplasty 10/2017 10/12/2017   Iron deficiency anemia 03/27/2017   Lipoma 02/14/2017   Internal hemorrhoids 02/13/2017   TIA (transient ischemic attack) 08/21/2015   Aortic valve stenosis 06/11/2015   Dental caries 05/09/2013   Perennial allergic rhinitis 04/03/2012   Hypothyroidism 03/30/2012   Osteoarthritis 11/07/2011   Antiplatelet or antithrombotic long-term use 09/01/2011   Essential hypertension 07/25/2011   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   Gastroesophageal reflux disease 07/25/2011   Tobacco abuse 07/25/2011   Hyperlipidemia 07/25/2011    PCP: Haze Kingfisher, MD  REFERRING PROVIDER: Haze Kingfisher, MD  REFERRING DIAG:  Diagnosis  R26.81 (ICD-10-CM) - Unsteadiness on feet    THERAPY DIAG:  Unsteadiness on feet  Muscle weakness (generalized)  Difficulty in walking, not elsewhere classified  Rationale for Evaluation and Treatment: Rehabilitation  ONSET DATE: chronic, had TIA in 2010  SUBJECTIVE:   SUBJECTIVE STATEMENT: Left calf pain. No falls. Doing ex at home and therapy is helping   Feel off balance, not sure why. Sometimes it comes from my hip giving out, sometimes I don't know why I feel unsteady. Hip has felt like  it'll give out off and on for the past couple of years. Have some lower back pain.   PERTINENT HISTORY: See above  PAIN:  Are you having pain? Left calf 1/10, last night 10/10  PRECAUTIONS: None  RED FLAGS: None   WEIGHT BEARING RESTRICTIONS: No  FALLS:  Has patient fallen in last 6 months? Yes. Number of falls 2- was just walking and fell   LIVING ENVIRONMENT: Lives with: lives with their family Lives in: House/apartment    OCCUPATION: retired- worked in [employer]   PLOF: Independent, Independent with basic ADLs, Independent  with gait, and Independent with transfers  PATIENT GOALS: I'm here because the doctor sent me   NEXT MD VISIT: Referring PRN   OBJECTIVE:  Note: Objective measures were completed at Evaluation unless otherwise noted.    PATIENT SURVEYS:    THE PATIENT SPECIFIC FUNCTIONAL SCALE  Place score of 0-10 (0 = unable to perform activity and 10 = able to perform activity at the same level as before injury or problem)  Activity Date: Eval 06/08/24    Standing more than 15-20 minutes  5    2.      3.     4.      Total Score 5      Total Score = Sum of activity scores/number of activities  Minimally Detectable Change: 3 points (for single activity); 2 points (for average score)  [facility] (nd). The Patient Specific Functional Scale . Retrieved from [email or web address]   COGNITION: Overall cognitive status: Within functional limits for tasks assessed     SENSATION: [facility]     LOWER EXTREMITY MMT:  MMT Right eval Left eval RT/Left 06/24/24  Hip flexion 4+ 4+ 4+ BIL  Hip extension     Hip abduction 3- seated  3- seated  4 BIL  Hip adduction     Hip internal rotation     Hip external rotation     Knee flexion 4+ 4+   Knee extension 4+ 4+   Ankle dorsiflexion 5 5   Ankle plantarflexion     Ankle inversion     Ankle eversion      (Blank rows = not tested)    FUNCTIONAL TESTS:  5 times sit to stand: 17.4 seconds with posterior LOB on last rep  3 minute walk test: 461ft  Dynamic Gait Index: Dynamic Gait Index  Mark the lowest level that applies.   Date Performed 06/08/24 (eval)  Gait level surface (3) Normal: walks 20', no AD, good speed, no evidence for imbalance, normal gait pattern  2. Change in gait speed (3) Normal: Able to smoothly change walking speed without loss of balance or gait deviation. Shows a significant difference in walking speeds between normal, fast and slow speeds  3. Gait with  horizontal head turns (2) Mild Impairment: Performs head turns smoothly with slight change in gait velocity, i.e., minor disruption to smooth gait path or uses walking aid  4. Gait with vertical head turns (2) Mild Impairment: Performs head turns smoothly with slight change in gait velocity, i.e., minor disruption to smooth gait path or uses walking aid  5. Gait and pivot turn (1) Moderate Impairment: Turns slowly, requires verbal cueing, requires several small steps to catch balance following turn and stop  6. Step over obstacle (2) Mild Impairment: Is able to step over box, but must slow down and adjust steps to clear box safely  7. Step around obstacle (2) Mild Impairment: Is  able to step around both cones, but must slow down and adjust steps to clear cones  8. Steps (2) Mild Impairment: Alternating feet, must use rail  Total score 17    Score Interpretation: Score of <19 indicates high risk of falls.  Minimally Clinically Important Difference (MCID):  =DGI scores of<21/24 = 1.80 points DGI scores of >21/24 = 0.60 points   Palo T, Inbar-Borovsky N, Brozgol M, Giladi N, [place] JM. The Dynamic Gait Index in healthy older adults: the role of stair climbing, fear of falling and gender. Gait Posture. 2009 Feb;29(2):237-41. doi: 10.1016/j.gaitpost.2008.08.013. Epub 2008 Oct 8. PMID: 81154560; PMCID: EFR7290501.  Pardasaney, MYRTIS LOIS Bonus, GEANNIE POUR., et al. (2012). Sensitivity to change and responsiveness of four balance measures for community-dwelling older adults. Physical therapy 92(3): 388-397.   GAIT: Distance walked: 469ft Assistive device utilized: None Level of assistance: Complete Independence Comments: mild unsteadiness noted when fatigued increased                                                                                                                                 TREATMENT DATE:   06/24/24 Nustep L 5 LE ony Standing with UE support 5# ankle wts alt LE 10x  each then alteranate 10 x each- marching, SL hip flex,ext and abd- c/o RT hip pain/cramping 6 inch step tap with 5# ankle wts with UE support 10x BIL then alt 20x STS with yellow wt ball chest press 10 x Resisted gait 20 # fwd and back 5 x each,laterally 3 x each- back pain with backward walking  and side walking left hip discomfort Supine feet on ball bridge,KTC and obl Iso abdominals 10 x 3 sec hold Add ball squeeze bridge 10 x Bridge with clam 10x     06/22/24 NuStep L 5 x 6 min 6in step ups x10 each HS curls 25lb 2x10 Leg Ext 10lb 2x10, 5lb RLE x10 Hip add ball squeeze 2x15 Resisted side steps 20lb x5 each   06/14/24 Nustep L 4 ADD ball squeeze hold 3 sec 2 sets 10 Green tband clams 2 sets 10-issued for HEP Green tband hip flex 2 sets 10-issued for HEP STS with 3# chest press 10 x Knee ext 10# 2 sets 8 HS curl 20# 2 sets 10 Resisted gait 20 # fwd and back 5 x each,laterally 3 x each Foam beam side stepping in// bars, then tandem fwd and back with UE 3 x each   06/08/24  Eval, POC, HEP and education as below   Nustep L4 seat 9 x54minutes all four extremities       PATIENT EDUCATION:  Education details: exam findings, POC, HEP  Person educated: Patient Education method: Explanation, Demonstration, and Handouts Education comprehension: verbalized understanding, returned demonstration, and needs further education  HOME EXERCISE PROGRAM: Access Code: [email or web address] URL: [email or web address] Date: 06/08/2024 Prepared by: Josette Rough  Exercises - Standing Hip Abduction with Resistance at Thighs  - 1  x daily - 5 x weekly - 2 sets - 10 reps - Tandem Stance in Corner  - 1 x daily - 7 x weekly - 2 sets - 6 reps - 15-30 seconds  hold - Standing Single Leg Stance with Counter Support  - 1 x daily - 7 x weekly - 2 sets - 6 reps - 15-30 seconds  hold  ASSESSMENT:  CLINICAL IMPRESSION: assessed and documented goals. Pt reports doing HEP. MMT goals met.  Gait goal and standing goal she is progressing, subjectively reports standing tolerance only 5-8 minutes. Continue to work on strength  and balance ex and tried to incorporate more wt bearing ex with seated rest needed. C/O RT hip pain and weakness with most standing interventions but able to work through, pt states same place that prevents her form standing too long but its usually sharp in RT hip and back   Patient is a 68 y.o. F who was seen today for physical therapy evaluation and treatment for  Diagnosis  R26.81 (ICD-10-CM) - Unsteadiness on feet  . Does show some severe hip abductor weakness as well as general balance impairment especially dynamically. Very sedentary and not too interested in getting to the gym. Will trial PT, if she is happy with the results of that period and making good progress, we can certainly consider extending POC at that point.   OBJECTIVE IMPAIRMENTS: Abnormal gait, decreased activity tolerance, decreased balance, decreased mobility, decreased strength, and obesity.   ACTIVITY LIMITATIONS: standing, stairs, transfers, and locomotion level  PARTICIPATION LIMITATIONS: driving, shopping, community activity, and yard work  PERSONAL FACTORS: Age, Behavior pattern, Education, Fitness, Past/current experiences, Social background, and Time since onset of injury/illness/exacerbation are also affecting patient's functional outcome.   REHAB POTENTIAL: Fair sedentary lifestyle, limited buy in to PT, chronicity of impairments   CLINICAL DECISION MAKING: Stable/uncomplicated  EVALUATION COMPLEXITY: Low   GOALS: Goals reviewed with patient? No  SHORT TERM GOALS: Target date: STGs = LTGs      LONG TERM GOALS: Target date: 07/06/2024    MMT in hip abductors to be at least 4/5 B Baseline:  Goal status: MET 06/24/24  2.  DGI to be at least 20 to show improved functional balance Baseline:  Goal status: INITIAL  3. Will ambulate at least 532ft in with RPE no  more than 3/10 to show improved activity tolerance and community access  Baseline:  Goal status: progressing 06/24/24  4.  Will be able to stand for 30 minutes without difficulty  Baseline:  Goal status: subjectively reports 5-7 minutes  5.  PSFS to have improved by 2 points  Baseline:  Goal status: INITIAL     PLAN:  PT FREQUENCY: 2x/week  PT DURATION: 4 weeks  PLANNED INTERVENTIONS: 97750- Physical Performance Testing, 97110-Therapeutic exercises, 97530- Therapeutic activity, [id number]- Neuromuscular re-education, 97535- Self Care, [id number]- Manual therapy, and [id number]- Gait training  PLAN FOR NEXT SESSION: progress strength, balance and gait. Work in wt bearing as her standing tolerance is limited Jon Terrilee Dudzik PTA 06/24/24 9:27 AM

## 2024-06-27 ENCOUNTER — Ambulatory Visit: Admitting: Physical Therapy

## 2024-06-27 ENCOUNTER — Encounter: Payer: Self-pay | Admitting: Physical Therapy

## 2024-06-27 DIAGNOSIS — R2681 Unsteadiness on feet: Secondary | ICD-10-CM | POA: Diagnosis not present

## 2024-06-27 DIAGNOSIS — M6281 Muscle weakness (generalized): Secondary | ICD-10-CM

## 2024-06-27 DIAGNOSIS — R262 Difficulty in walking, not elsewhere classified: Secondary | ICD-10-CM

## 2024-06-27 NOTE — Therapy (Signed)
 OUTPATIENT PHYSICAL THERAPY LOWER EXTREMITY TREATMENT    Patient Name: Emma Bonilla MRN: [id number] DOB:09/12/1956, 68 y.o., female Today's Date: 06/27/2024  END OF SESSION:  PT End of Session - 06/27/24 1438     Visit Number 5    Number of Visits 9    Date for PT Re-Evaluation 07/06/24    Authorization Type [employer] Dual    Authorization Time Period 06/08/24 to 07/20/24    Progress Note Due on Visit 10    PT Start Time 1432    PT Stop Time 1513    PT Time Calculation (min) 41 min    Activity Tolerance Patient tolerated treatment well    Behavior During Therapy WFL for tasks assessed/performed           Past Medical History:  Diagnosis Date   Anemia    Arthritis    Depression    Diastolic dysfunction without heart failure    Family history of rectal cancer    GERD (gastroesophageal reflux disease)    Hepatic steatosis    a. seen on prior US .   Hyperlipidemia    Hypertension    Mild aortic regurgitation    Mild aortic stenosis    Premature atrial contractions    PVC's (premature ventricular contractions)    Thyroid  disease    TIA (transient ischemic attack)    2010   Tobacco abuse    Past Surgical History:  Procedure Laterality Date   CORONARY STENT INTERVENTION N/A 10/13/2017   Procedure: CORONARY STENT INTERVENTION;  Surgeon: Anner Alm ORN, MD;  Location: [facility] INVASIVE CV LAB;  Service: Cardiovascular;  Laterality: N/A;   DILATION AND CURETTAGE OF UTERUS     30 years ago   FOOT SURGERY     LEFT HEART CATH AND CORONARY ANGIOGRAPHY N/A 10/13/2017   Procedure: LEFT HEART CATH AND CORONARY ANGIOGRAPHY;  Surgeon: Anner Alm ORN, MD;  Location: [facility] INVASIVE CV LAB;  Service: Cardiovascular;  Laterality: N/A;   Patient Active Problem List   Diagnosis Date Noted   Irregular bowel habits 06/12/2022   Preventive measure 05/19/2022   Type 2 diabetes mellitus, controlled (HCC) 04/30/2022   Family history of rectal cancer 01/16/2022   Chronic insomnia 01/16/2022    Hepatic steatosis 04/30/2021   Hx of adenomatous colonic polyps 04/18/2021   Patellar subluxation 03/03/2021   Numbness and tingling in both hands 04/23/2020   Obstructive sleep apnea hypopnea, moderate 06/20/2019   Atypical chest pain 04/17/2018   Obesity, Class II, BMI 35-39.9 10/13/2017   CAD S/P percutaneous coronary angioplasty 10/2017 10/12/2017   Iron deficiency anemia 03/27/2017   Lipoma 02/14/2017   Internal hemorrhoids 02/13/2017   TIA (transient ischemic attack) 08/21/2015   Aortic valve stenosis 06/11/2015   Dental caries 05/09/2013   Perennial allergic rhinitis 04/03/2012   Hypothyroidism 03/30/2012   Osteoarthritis 11/07/2011   Antiplatelet or antithrombotic long-term use 09/01/2011   Essential hypertension 07/25/2011   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   Gastroesophageal reflux disease 07/25/2011   Tobacco abuse 07/25/2011   Hyperlipidemia 07/25/2011    PCP: Haze Kingfisher, MD  REFERRING PROVIDER: Haze Kingfisher, MD  REFERRING DIAG:  Diagnosis  R26.81 (ICD-10-CM) - Unsteadiness on feet    THERAPY DIAG:  Unsteadiness on feet  Muscle weakness (generalized)  Difficulty in walking, not elsewhere classified  Rationale for Evaluation and Treatment: Rehabilitation  ONSET DATE: chronic, had TIA in 2010  SUBJECTIVE:   SUBJECTIVE STATEMENT:  Feeling OK, nothing new. Have been having sharp pains R  hip joint since yesterday, stood a lot yesterday.    Feel off balance, not sure why. Sometimes it comes from my hip giving out, sometimes I don't know why I feel unsteady. Hip has felt like it'll give out off and on for the past couple of years. Have some lower back pain.   PERTINENT HISTORY: See above  PAIN:  Are you having pain? 4/10 R hip in the joint (calf is OK today), sharp, standing on hip makes it worse, nothing makes it better  PRECAUTIONS: None  RED FLAGS: None   WEIGHT BEARING RESTRICTIONS: No  FALLS:  Has patient  fallen in last 6 months? Yes. Number of falls 2- was just walking and fell   LIVING ENVIRONMENT: Lives with: lives with their family Lives in: House/apartment    OCCUPATION: retired- worked in [employer]   PLOF: Independent, Independent with basic ADLs, Independent with gait, and Independent with transfers  PATIENT GOALS: I'm here because the doctor sent me   NEXT MD VISIT: Referring PRN   OBJECTIVE:  Note: Objective measures were completed at Evaluation unless otherwise noted.    PATIENT SURVEYS:    THE PATIENT SPECIFIC FUNCTIONAL SCALE  Place score of 0-10 (0 = unable to perform activity and 10 = able to perform activity at the same level as before injury or problem)  Activity Date: Eval 06/08/24    Standing more than 15-20 minutes  5    2.      3.     4.      Total Score 5      Total Score = Sum of activity scores/number of activities  Minimally Detectable Change: 3 points (for single activity); 2 points (for average score)  [facility] (nd). The Patient Specific Functional Scale . Retrieved from [email or web address]   COGNITION: Overall cognitive status: Within functional limits for tasks assessed     SENSATION: [facility]     LOWER EXTREMITY MMT:  MMT Right eval Left eval RT/Left 06/24/24  Hip flexion 4+ 4+ 4+ BIL  Hip extension     Hip abduction 3- seated  3- seated  4 BIL  Hip adduction     Hip internal rotation     Hip external rotation     Knee flexion 4+ 4+   Knee extension 4+ 4+   Ankle dorsiflexion 5 5   Ankle plantarflexion     Ankle inversion     Ankle eversion      (Blank rows = not tested)    FUNCTIONAL TESTS:  5 times sit to stand: 17.4 seconds with posterior LOB on last rep  3 minute walk test: 410ft  Dynamic Gait Index: Dynamic Gait Index  Mark the lowest level that applies.   Date Performed 06/08/24 (eval)  Gait level surface (3) Normal: walks 20', no AD,  good speed, no evidence for imbalance, normal gait pattern  2. Change in gait speed (3) Normal: Able to smoothly change walking speed without loss of balance or gait deviation. Shows a significant difference in walking speeds between normal, fast and slow speeds  3. Gait with horizontal head turns (2) Mild Impairment: Performs head turns smoothly with slight change in gait velocity, i.e., minor disruption to smooth gait path or uses walking aid  4. Gait with vertical head turns (2) Mild Impairment: Performs head turns smoothly with slight change in gait velocity, i.e., minor disruption to smooth gait path or uses walking aid  5. Gait and pivot turn (1)  Moderate Impairment: Turns slowly, requires verbal cueing, requires several small steps to catch balance following turn and stop  6. Step over obstacle (2) Mild Impairment: Is able to step over box, but must slow down and adjust steps to clear box safely  7. Step around obstacle (2) Mild Impairment: Is able to step around both cones, but must slow down and adjust steps to clear cones  8. Steps (2) Mild Impairment: Alternating feet, must use rail  Total score 17    Score Interpretation: Score of <19 indicates high risk of falls.  Minimally Clinically Important Difference (MCID):  =DGI scores of<21/24 = 1.80 points DGI scores of >21/24 = 0.60 points   [place] T, Inbar-Borovsky N, Brozgol M, Giladi N, [place] JM. The Dynamic Gait Index in healthy older adults: the role of stair climbing, fear of falling and gender. Gait Posture. 2009 Feb;29(2):237-41. doi: 10.1016/[email or web address].2008.08.013. Epub 2008 Oct 8. PMID: 81154560; PMCID: [id number].  Pardasaney, MYRTIS LOIS Bonus, GEANNIE POUR., et al. (2012). Sensitivity to change and responsiveness of four balance measures for community-dwelling older adults. Physical therapy 92(3): 388-397.   GAIT: Distance walked: 445ft Assistive device utilized: None Level of assistance: Complete Independence Comments: mild  unsteadiness noted when fatigued increased                                                                                                                                 TREATMENT DATE:    06/27/24  Nustep L5x8 minutes all four extremities, seat 9  Tandem stance 3x30 seconds B blue foam  Tandem walks solid surface x4 laps // bars  Side steps blue foam pad x 4 laps // bars   Bridge + ABD into green TB 2x15 Sidelying hip ABD into green TB 2x10 B Walking bridges x10       06/24/24 Nustep L 5 LE ony Standing with UE support 5# ankle wts alt LE 10x each then alteranate 10 x each- marching, SL hip flex,ext and abd- c/o RT hip pain/cramping 6 inch step tap with 5# ankle wts with UE support 10x BIL then alt 20x STS with yellow wt ball chest press 10 x Resisted gait 20 # fwd and back 5 x each,laterally 3 x each- back pain with backward walking  and side walking left hip discomfort Supine feet on ball bridge,KTC and obl Iso abdominals 10 x 3 sec hold Add ball squeeze bridge 10 x Bridge with clam 10x         PATIENT EDUCATION:  Education details: exam findings, POC, HEP  Person educated: Patient Education method: Programmer, multimedia, Facilities manager, and Handouts Education comprehension: verbalized understanding, returned demonstration, and needs further education  HOME EXERCISE PROGRAM:  Access Code: [email or web address] URL: [email or web address] Date: 06/27/2024 Prepared by: Josette Rough  Exercises - Standing Hip Abduction with Resistance at Thighs  - 1 x daily - 5 x weekly - 2 sets - 10 reps - Tandem Stance in Corner  - 1  x daily - 7 x weekly - 2 sets - 6 reps - 15-30 seconds  hold - Standing Single Leg Stance with Counter Support  - 1 x daily - 7 x weekly - 2 sets - 6 reps - 15-30 seconds  hold - Supine Bridge with Resistance Band  - 1 x daily - 7 x weekly - 2 sets - 10 reps - 1 second  hold - Sidelying Hip Abduction with Resistance at Thighs  - 1 x daily - 7 x  weekly - 2 sets - 10 reps - Bridge Walk Out  - 1 x daily - 7 x weekly - 2 sets - 10 reps     ASSESSMENT:  CLINICAL IMPRESSION:    Doing OK, was able to stand for much longer than her usual yesterday and now having increased R hip pain. We worked on a healthy mix of balance and open chain strengthening (backed off on CKC work due to flared hip pain at arrival this visit). Will continue to progress as able/tolerated.    Patient is a 69 y.o. F who was seen today for physical therapy evaluation and treatment for  Diagnosis  R26.81 (ICD-10-CM) - Unsteadiness on feet  . Does show some severe hip abductor weakness as well as general balance impairment especially dynamically. Very sedentary and not too interested in getting to the gym. Will trial PT, if she is happy with the results of that period and making good progress, we can certainly consider extending POC at that point.   OBJECTIVE IMPAIRMENTS: Abnormal gait, decreased activity tolerance, decreased balance, decreased mobility, decreased strength, and obesity.   ACTIVITY LIMITATIONS: standing, stairs, transfers, and locomotion level  PARTICIPATION LIMITATIONS: driving, shopping, community activity, and yard work  PERSONAL FACTORS: Age, Behavior pattern, Education, Fitness, Past/current experiences, Social background, and Time since onset of injury/illness/exacerbation are also affecting patient's functional outcome.   REHAB POTENTIAL: Fair sedentary lifestyle, limited buy in to PT, chronicity of impairments   CLINICAL DECISION MAKING: Stable/uncomplicated  EVALUATION COMPLEXITY: Low   GOALS: Goals reviewed with patient? No  SHORT TERM GOALS: Target date: STGs = LTGs      LONG TERM GOALS: Target date: 07/06/2024    MMT in hip abductors to be at least 4/5 B Baseline:  Goal status: MET 06/24/24  2.  DGI to be at least 20 to show improved functional balance Baseline:  Goal status: INITIAL  3. Will ambulate at least 546ft  in with RPE no more than 3/10 to show improved activity tolerance and community access  Baseline:  Goal status: progressing 06/24/24  4.  Will be able to stand for 30 minutes without difficulty  Baseline:  Goal status: subjectively reports 5-7 minutes  5.  PSFS to have improved by 2 points  Baseline:  Goal status: INITIAL     PLAN:  PT FREQUENCY: 2x/week  PT DURATION: 4 weeks  PLANNED INTERVENTIONS: 97750- Physical Performance Testing, 97110-Therapeutic exercises, 97530- Therapeutic activity, [id number]- Neuromuscular re-education, 97535- Self Care, [id number]- Manual therapy, and [id number]- Gait training  PLAN FOR NEXT SESSION: progress strength, balance and gait. Progress standing tolerance as able while considering R hip pain    Josette Rough, PT, DPT 06/27/24 3:15 PM

## 2024-06-29 ENCOUNTER — Ambulatory Visit: Admitting: Physical Therapy

## 2024-06-30 ENCOUNTER — Encounter: Payer: Self-pay | Admitting: Physical Therapy

## 2024-06-30 ENCOUNTER — Ambulatory Visit: Admitting: Physical Therapy

## 2024-06-30 DIAGNOSIS — R2681 Unsteadiness on feet: Secondary | ICD-10-CM

## 2024-06-30 DIAGNOSIS — M6281 Muscle weakness (generalized): Secondary | ICD-10-CM

## 2024-06-30 DIAGNOSIS — R262 Difficulty in walking, not elsewhere classified: Secondary | ICD-10-CM

## 2024-06-30 NOTE — Therapy (Signed)
 OUTPATIENT PHYSICAL THERAPY LOWER EXTREMITY TREATMENT    Patient Name: Emma Bonilla MRN: [id number] DOB:August 17, 1956, 68 y.o., female Today's Date: 06/30/2024  END OF SESSION:  PT End of Session - 06/30/24 0804     Visit Number 6    Date for PT Re-Evaluation 07/06/24    PT Start Time 0802    PT Stop Time 0845    PT Time Calculation (min) 43 min    Activity Tolerance Patient tolerated treatment well    Behavior During Therapy WFL for tasks assessed/performed           Past Medical History:  Diagnosis Date   Anemia    Arthritis    Depression    Diastolic dysfunction without heart failure    Family history of rectal cancer    GERD (gastroesophageal reflux disease)    Hepatic steatosis    a. seen on prior US .   Hyperlipidemia    Hypertension    Mild aortic regurgitation    Mild aortic stenosis    Premature atrial contractions    PVC's (premature ventricular contractions)    Thyroid  disease    TIA (transient ischemic attack)    2010   Tobacco abuse    Past Surgical History:  Procedure Laterality Date   CORONARY STENT INTERVENTION N/A 10/13/2017   Procedure: CORONARY STENT INTERVENTION;  Surgeon: Anner Alm ORN, MD;  Location: [facility] INVASIVE CV LAB;  Service: Cardiovascular;  Laterality: N/A;   DILATION AND CURETTAGE OF UTERUS     30 years ago   FOOT SURGERY     LEFT HEART CATH AND CORONARY ANGIOGRAPHY N/A 10/13/2017   Procedure: LEFT HEART CATH AND CORONARY ANGIOGRAPHY;  Surgeon: Anner Alm ORN, MD;  Location: [facility] INVASIVE CV LAB;  Service: Cardiovascular;  Laterality: N/A;   Patient Active Problem List   Diagnosis Date Noted   Irregular bowel habits 06/12/2022   Preventive measure 05/19/2022   Type 2 diabetes mellitus, controlled (HCC) 04/30/2022   Family history of rectal cancer 01/16/2022   Chronic insomnia 01/16/2022   Hepatic steatosis 04/30/2021   Hx of adenomatous colonic polyps 04/18/2021   Patellar subluxation 03/03/2021   Numbness and tingling in  both hands 04/23/2020   Obstructive sleep apnea hypopnea, moderate 06/20/2019   Atypical chest pain 04/17/2018   Obesity, Class II, BMI 35-39.9 10/13/2017   CAD S/P percutaneous coronary angioplasty 10/2017 10/12/2017   Iron deficiency anemia 03/27/2017   Lipoma 02/14/2017   Internal hemorrhoids 02/13/2017   TIA (transient ischemic attack) 08/21/2015   Aortic valve stenosis 06/11/2015   Dental caries 05/09/2013   Perennial allergic rhinitis 04/03/2012   Hypothyroidism 03/30/2012   Osteoarthritis 11/07/2011   Antiplatelet or antithrombotic long-term use 09/01/2011   Essential hypertension 07/25/2011   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   Gastroesophageal reflux disease 07/25/2011   Tobacco abuse 07/25/2011   Hyperlipidemia 07/25/2011    PCP: Haze Kingfisher, MD  REFERRING PROVIDER: Haze Kingfisher, MD  REFERRING DIAG:  Diagnosis  R26.81 (ICD-10-CM) - Unsteadiness on feet    THERAPY DIAG:  Unsteadiness on feet  Muscle weakness (generalized)  Difficulty in walking, not elsewhere classified  Rationale for Evaluation and Treatment: Rehabilitation  ONSET DATE: chronic, had TIA in 2010  SUBJECTIVE:   SUBJECTIVE STATEMENT:  A lot better than I was yestrday BP has been running low   Feel off balance, not sure why. Sometimes it comes from my hip giving out, sometimes I don't know why I feel unsteady. Hip has felt like it'll give  out off and on for the past couple of years. Have some lower back pain.   PERTINENT HISTORY: See above  PAIN:  Are you having pain? 4/10 R hip in the joint (calf is OK today), sharp, standing on hip makes it worse, nothing makes it better  PRECAUTIONS: None  RED FLAGS: None   WEIGHT BEARING RESTRICTIONS: No  FALLS:  Has patient fallen in last 6 months? Yes. Number of falls 2- was just walking and fell   LIVING ENVIRONMENT: Lives with: lives with their family Lives in: House/apartment    OCCUPATION: retired-  worked in [employer]   PLOF: Independent, Independent with basic ADLs, Independent with gait, and Independent with transfers  PATIENT GOALS: I'm here because the doctor sent me   NEXT MD VISIT: Referring PRN   OBJECTIVE:  Note: Objective measures were completed at Evaluation unless otherwise noted.    PATIENT SURVEYS:    THE PATIENT SPECIFIC FUNCTIONAL SCALE  Place score of 0-10 (0 = unable to perform activity and 10 = able to perform activity at the same level as before injury or problem)  Activity Date: Eval 06/08/24    Standing more than 15-20 minutes  5    2.      3.     4.      Total Score 5      Total Score = Sum of activity scores/number of activities  Minimally Detectable Change: 3 points (for single activity); 2 points (for average score)  [facility] (nd). The Patient Specific Functional Scale . Retrieved from [email or web address]   COGNITION: Overall cognitive status: Within functional limits for tasks assessed     SENSATION: [facility]     LOWER EXTREMITY MMT:  MMT Right eval Left eval RT/Left 06/24/24  Hip flexion 4+ 4+ 4+ BIL  Hip extension     Hip abduction 3- seated  3- seated  4 BIL  Hip adduction     Hip internal rotation     Hip external rotation     Knee flexion 4+ 4+   Knee extension 4+ 4+   Ankle dorsiflexion 5 5   Ankle plantarflexion     Ankle inversion     Ankle eversion      (Blank rows = not tested)    FUNCTIONAL TESTS:  5 times sit to stand: 17.4 seconds with posterior LOB on last rep  3 minute walk test: 440ft  Dynamic Gait Index: Dynamic Gait Index  Mark the lowest level that applies.   Date Performed 06/08/24 (eval) 06/30/24  Gait level surface (3) Normal: walks 20', no AD, good speed, no evidence for imbalance, normal gait pattern 3  2. Change in gait speed (3) Normal: Able to smoothly change walking speed without loss of balance or gait deviation.  Shows a significant difference in walking speeds between normal, fast and slow speeds  3  3. Gait with horizontal head turns (2) Mild Impairment: Performs head turns smoothly with slight change in gait velocity, i.e., minor disruption to smooth gait path or uses walking aid 2  4. Gait with vertical head turns (2) Mild Impairment: Performs head turns smoothly with slight change in gait velocity, i.e., minor disruption to smooth gait path or uses walking aid 3  5. Gait and pivot turn (1) Moderate Impairment: Turns slowly, requires verbal cueing, requires several small steps to catch balance following turn and stop 2  6. Step over obstacle (2) Mild Impairment: Is able to step over box,  but must slow down and adjust steps to clear box safely 2  7. Step around obstacle (2) Mild Impairment: Is able to step around both cones, but must slow down and adjust steps to clear cones 2  8. Steps (2) Mild Impairment: Alternating feet, must use rail 3  Total score 17 20    Score Interpretation: Score of <19 indicates high risk of falls.  Minimally Clinically Important Difference (MCID):  =DGI scores of<21/24 = 1.80 points DGI scores of >21/24 = 0.60 points   Octa T, Inbar-Borovsky N, Brozgol M, Giladi N, [place] JM. The Dynamic Gait Index in healthy older adults: the role of stair climbing, fear of falling and gender. Gait Posture. 2009 Feb;29(2):237-41. doi: 10.1016/j.gaitpost.2008.08.013. Epub 2008 Oct 8. PMID: 81154560; PMCID: EFR7290501.  Pardasaney, MYRTIS LOIS Bonus, GEANNIE POUR., et al. (2012). Sensitivity to change and responsiveness of four balance measures for community-dwelling older adults. Physical therapy 92(3): 388-397.   GAIT: Distance walked: 414ft Assistive device utilized: None Level of assistance: Complete Independence Comments: mild unsteadiness noted when fatigued increased                                                                                                                                  TREATMENT DATE:  06/30/24 BP 102/77 NuStep L 5 x 6 min DGI 20/24 HS curls 25lb 2x10 Sit to stand LE on airex 2x10 Alt 6 in box taps form airex  Shoulder Ext 5lb 2x10   06/27/24  Nustep L5x8 minutes all four extremities, seat 9  Tandem stance 3x30 seconds B blue foam  Tandem walks solid surface x4 laps // bars  Side steps blue foam pad x 4 laps // bars   Bridge + ABD into green TB 2x15 Sidelying hip ABD into green TB 2x10 B Walking bridges x10       06/24/24 Nustep L 5 LE ony Standing with UE support 5# ankle wts alt LE 10x each then alteranate 10 x each- marching, SL hip flex,ext and abd- c/o RT hip pain/cramping 6 inch step tap with 5# ankle wts with UE support 10x BIL then alt 20x STS with yellow wt ball chest press 10 x Resisted gait 20 # fwd and back 5 x each,laterally 3 x each- back pain with backward walking  and side walking left hip discomfort Supine feet on ball bridge,KTC and obl Iso abdominals 10 x 3 sec hold Add ball squeeze bridge 10 x Bridge with clam 10x         PATIENT EDUCATION:  Education details: exam findings, POC, HEP  Person educated: Patient Education method: Programmer, multimedia, Facilities manager, and Handouts Education comprehension: verbalized understanding, returned demonstration, and needs further education  HOME EXERCISE PROGRAM:  Access Code: [email or web address] URL: [email or web address] Date: 06/27/2024 Prepared by: Josette Rough  Exercises - Standing Hip Abduction with Resistance at Thighs  - 1 x daily - 5 x weekly - 2 sets - 10 reps - Tandem  Stance in Corner  - 1 x daily - 7 x weekly - 2 sets - 6 reps - 15-30 seconds  hold - Standing Single Leg Stance with Counter Support  - 1 x daily - 7 x weekly - 2 sets - 6 reps - 15-30 seconds  hold - Supine Bridge with Resistance Band  - 1 x daily - 7 x weekly - 2 sets - 10 reps - 1 second  hold - Sidelying Hip Abduction with Resistance at Thighs  - 1 x daily - 7 x weekly - 2  sets - 10 reps - Bridge Walk Out  - 1 x daily - 7 x weekly - 2 sets - 10 reps     ASSESSMENT:  CLINICAL IMPRESSION:    Doing OK, BP checked due to subjective reports. Some improvement made towards goals. We worked on a healthy mix of balance and open chain strengthening. Instability present on airex. Will continue to progress as able/tolerated.    Patient is a 68 y.o. F who was seen today for physical therapy evaluation and treatment for  Diagnosis  R26.81 (ICD-10-CM) - Unsteadiness on feet  . Does show some severe hip abductor weakness as well as general balance impairment especially dynamically. Very sedentary and not too interested in getting to the gym. Will trial PT, if she is happy with the results of that period and making good progress, we can certainly consider extending POC at that point.   OBJECTIVE IMPAIRMENTS: Abnormal gait, decreased activity tolerance, decreased balance, decreased mobility, decreased strength, and obesity.   ACTIVITY LIMITATIONS: standing, stairs, transfers, and locomotion level  PARTICIPATION LIMITATIONS: driving, shopping, community activity, and yard work  PERSONAL FACTORS: Age, Behavior pattern, Education, Fitness, Past/current experiences, Social background, and Time since onset of injury/illness/exacerbation are also affecting patient's functional outcome.   REHAB POTENTIAL: Fair sedentary lifestyle, limited buy in to PT, chronicity of impairments   CLINICAL DECISION MAKING: Stable/uncomplicated  EVALUATION COMPLEXITY: Low   GOALS: Goals reviewed with patient? No  SHORT TERM GOALS: Target date: STGs = LTGs      LONG TERM GOALS: Target date: 07/06/2024    MMT in hip abductors to be at least 4/5 B Baseline:  Goal status: MET 06/24/24  2.  DGI to be at least 20 to show improved functional balance Baseline:  Goal status: Met 06/30/24   3. Will ambulate at least 552ft in with RPE no more than 3/10 to show improved activity  tolerance and community access  Baseline:  Goal status: progressing 06/24/24  4.  Will be able to stand for 30 minutes without difficulty  Baseline:  Goal status: subjectively reports 5-7 minutes  5.  PSFS to have improved by 2 points  Baseline:  Goal status: INITIAL     PLAN:  PT FREQUENCY: 2x/week  PT DURATION: 4 weeks  PLANNED INTERVENTIONS: 97750- Physical Performance Testing, 97110-Therapeutic exercises, 97530- Therapeutic activity, [id number]- Neuromuscular re-education, 97535- Self Care, [id number]- Manual therapy, and [id number]- Gait training  PLAN FOR NEXT SESSION: progress strength, balance and gait. Progress standing tolerance as able while considering R hip pain    Josette Rough, PT, DPT 06/30/24 8:04 AM

## 2024-07-01 NOTE — Procedures (Signed)
 SABRA

## 2024-07-04 ENCOUNTER — Encounter: Payer: Self-pay | Admitting: Physical Therapy

## 2024-07-04 ENCOUNTER — Ambulatory Visit: Attending: Family Medicine | Admitting: Physical Therapy

## 2024-07-04 DIAGNOSIS — R262 Difficulty in walking, not elsewhere classified: Secondary | ICD-10-CM | POA: Diagnosis present

## 2024-07-04 DIAGNOSIS — R2681 Unsteadiness on feet: Secondary | ICD-10-CM | POA: Diagnosis present

## 2024-07-04 DIAGNOSIS — M6281 Muscle weakness (generalized): Secondary | ICD-10-CM | POA: Diagnosis present

## 2024-07-04 NOTE — Therapy (Signed)
 OUTPATIENT PHYSICAL THERAPY LOWER EXTREMITY TREATMENT    Patient Name: Emma Bonilla MRN: [id number] DOB:10-27-1956, 68 y.o., female Today's Date: 07/04/2024  END OF SESSION:  PT End of Session - 07/04/24 1429     Visit Number 7    Date for PT Re-Evaluation 07/06/24    PT Start Time 1430    PT Stop Time 1515    PT Time Calculation (min) 45 min    Activity Tolerance Patient tolerated treatment well    Behavior During Therapy WFL for tasks assessed/performed           Past Medical History:  Diagnosis Date   Anemia    Arthritis    Depression    Diastolic dysfunction without heart failure    Family history of rectal cancer    GERD (gastroesophageal reflux disease)    Hepatic steatosis    a. seen on prior US .   Hyperlipidemia    Hypertension    Mild aortic regurgitation    Mild aortic stenosis    Premature atrial contractions    PVC's (premature ventricular contractions)    Thyroid  disease    TIA (transient ischemic attack)    2010   Tobacco abuse    Past Surgical History:  Procedure Laterality Date   CORONARY STENT INTERVENTION N/A 10/13/2017   Procedure: CORONARY STENT INTERVENTION;  Surgeon: Anner Alm ORN, MD;  Location: [facility] INVASIVE CV [facility];  Service: Cardiovascular;  Laterality: N/A;   DILATION AND CURETTAGE OF UTERUS     30 years ago   FOOT SURGERY     LEFT HEART CATH AND CORONARY ANGIOGRAPHY N/A 10/13/2017   Procedure: LEFT HEART CATH AND CORONARY ANGIOGRAPHY;  Surgeon: Anner Alm ORN, MD;  Location: [facility] INVASIVE CV LAB;  Service: Cardiovascular;  Laterality: N/A;   Patient Active Problem List   Diagnosis Date Noted   Irregular bowel habits 06/12/2022   Preventive measure 05/19/2022   Type 2 diabetes mellitus, controlled (HCC) 04/30/2022   Family history of rectal cancer 01/16/2022   Chronic insomnia 01/16/2022   Hepatic steatosis 04/30/2021   Hx of adenomatous colonic polyps 04/18/2021   Patellar subluxation 03/03/2021   Numbness and tingling in  both hands 04/23/2020   Obstructive sleep apnea hypopnea, moderate 06/20/2019   Atypical chest pain 04/17/2018   Obesity, Class II, BMI 35-39.9 10/13/2017   CAD S/P percutaneous coronary angioplasty 10/2017 10/12/2017   Iron deficiency anemia 03/27/2017   Lipoma 02/14/2017   Internal hemorrhoids 02/13/2017   TIA (transient ischemic attack) 08/21/2015   Aortic valve stenosis 06/11/2015   Dental caries 05/09/2013   Perennial allergic rhinitis 04/03/2012   Hypothyroidism 03/30/2012   Osteoarthritis 11/07/2011   Antiplatelet or antithrombotic long-term use 09/01/2011   Essential hypertension 07/25/2011   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   Gastroesophageal reflux disease 07/25/2011   Tobacco abuse 07/25/2011   Hyperlipidemia 07/25/2011    PCP: Haze Kingfisher, MD  REFERRING PROVIDER: Haze Kingfisher, MD  REFERRING DIAG:  Diagnosis  R26.81 (ICD-10-CM) - Unsteadiness on feet    THERAPY DIAG:  Unsteadiness on feet  Muscle weakness (generalized)  Difficulty in walking, not elsewhere classified  Rationale for Evaluation and Treatment: Rehabilitation  ONSET DATE: chronic, had TIA in 2010  SUBJECTIVE:   SUBJECTIVE STATEMENT:  Doing good   Feel off balance, not sure why. Sometimes it comes from my hip giving out, sometimes I don't know why I feel unsteady. Hip has felt like it'll give out off and on for the past couple of years.  Have some lower back pain.   PERTINENT HISTORY: See above  PAIN:  Are you having pain? No  PRECAUTIONS: None  RED FLAGS: None   WEIGHT BEARING RESTRICTIONS: No  FALLS:  Has patient fallen in last 6 months? Yes. Number of falls 2- was just walking and fell   LIVING ENVIRONMENT: Lives with: lives with their family Lives in: House/apartment    OCCUPATION: retired- worked in [employer]   PLOF: Independent, Independent with basic ADLs, Independent with gait, and Independent with transfers  PATIENT  GOALS: I'm here because the doctor sent me   NEXT MD VISIT: Referring PRN   OBJECTIVE:  Note: Objective measures were completed at Evaluation unless otherwise noted.    PATIENT SURVEYS:    THE PATIENT SPECIFIC FUNCTIONAL SCALE  Place score of 0-10 (0 = unable to perform activity and 10 = able to perform activity at the same level as before injury or problem)  Activity Date: Eval 06/08/24   Standing more than 15-20 minutes  5   2.     3.    4.     Total Score 5     Total Score = Sum of activity scores/number of activities  Minimally Detectable Change: 3 points (for single activity); 2 points (for average score)  [facility] (nd). The Patient Specific Functional Scale . Retrieved from [email or web address]   COGNITION: Overall cognitive status: Within functional limits for tasks assessed     SENSATION: [facility]     LOWER EXTREMITY MMT:  MMT Right eval Left eval RT/Left 06/24/24  Hip flexion 4+ 4+ 4+ BIL  Hip extension     Hip abduction 3- seated  3- seated  4 BIL  Hip adduction     Hip internal rotation     Hip external rotation     Knee flexion 4+ 4+   Knee extension 4+ 4+   Ankle dorsiflexion 5 5   Ankle plantarflexion     Ankle inversion     Ankle eversion      (Blank rows = not tested)    FUNCTIONAL TESTS:  5 times sit to stand: 17.4 seconds with posterior LOB on last rep  3 minute walk test: 478ft  Dynamic Gait Index: Dynamic Gait Index  Mark the lowest level that applies.   Date Performed 06/08/24 (eval) 06/30/24  Gait level surface (3) Normal: walks 20', no AD, good speed, no evidence for imbalance, normal gait pattern 3  2. Change in gait speed (3) Normal: Able to smoothly change walking speed without loss of balance or gait deviation. Shows a significant difference in walking speeds between normal, fast and slow speeds  3  3. Gait with horizontal head turns (2) Mild Impairment: Performs  head turns smoothly with slight change in gait velocity, i.e., minor disruption to smooth gait path or uses walking aid 2  4. Gait with vertical head turns (2) Mild Impairment: Performs head turns smoothly with slight change in gait velocity, i.e., minor disruption to smooth gait path or uses walking aid 3  5. Gait and pivot turn (1) Moderate Impairment: Turns slowly, requires verbal cueing, requires several small steps to catch balance following turn and stop 2  6. Step over obstacle (2) Mild Impairment: Is able to step over box, but must slow down and adjust steps to clear box safely 2  7. Step around obstacle (2) Mild Impairment: Is able to step around both cones, but must slow down and adjust steps to clear  cones 2  8. Steps (2) Mild Impairment: Alternating feet, must use rail 3  Total score 17 20    Score Interpretation: Score of <19 indicates high risk of falls.  Minimally Clinically Important Difference (MCID):  =DGI scores of<21/24 = 1.80 points DGI scores of >21/24 = 0.60 points   [place] T, Inbar-Borovsky N, Brozgol M, Giladi N, [place] JM. The Dynamic Gait Index in healthy older adults: the role of stair climbing, fear of falling and gender. Gait Posture. 2009 Feb;29(2):237-41. doi: 10.1016/j.gaitpost.2008.08.013. Epub 2008 Oct 8. PMID: 81154560; PMCID: EFR7290501.  Pardasaney, MYRTIS LOIS Bonus, GEANNIE POUR., et al. (2012). Sensitivity to change and responsiveness of four balance measures for community-dwelling older adults. Physical therapy 92(3): 388-397.   GAIT: Distance walked: 444ft Assistive device utilized: None Level of assistance: Complete Independence Comments: mild unsteadiness noted when fatigued increased                                                                                                                                 TREATMENT DATE:  07/04/24 NuStep L 5 x 6 min Gait around the front and R side back island, up and down slope, in grass, up and down  curbs Sit to stand holding yellow ball  HS curls 25lb 2x12 Leg Ext 10lb 2x12 Shoulder Ext 5lb 2x10 6in step ups x 5 each  06/30/24 BP 102/77 NuStep L 5 x 6 min DGI 20/24 HS curls 25lb 2x10 Sit to stand LE on airex 2x10 Alt 6 in box taps form airex  Shoulder Ext 5lb 2x10   06/27/24  Nustep L5x8 minutes all four extremities, seat 9  Tandem stance 3x30 seconds B blue foam  Tandem walks solid surface x4 laps // bars  Side steps blue foam pad x 4 laps // bars   Bridge + ABD into green TB 2x15 Sidelying hip ABD into green TB 2x10 B Walking bridges x10       06/24/24 Nustep L 5 LE ony Standing with UE support 5# ankle wts alt LE 10x each then alteranate 10 x each- marching, SL hip flex,ext and abd- c/o RT hip pain/cramping 6 inch step tap with 5# ankle wts with UE support 10x BIL then alt 20x STS with yellow wt ball chest press 10 x Resisted gait 20 # fwd and back 5 x each,laterally 3 x each- back pain with backward walking  and side walking left hip discomfort Supine feet on ball bridge,KTC and obl Iso abdominals 10 x 3 sec hold Add ball squeeze bridge 10 x Bridge with clam 10x         PATIENT EDUCATION:  Education details: exam findings, POC, HEP  Person educated: Patient Education method: Programmer, multimedia, Facilities manager, and Handouts Education comprehension: verbalized understanding, returned demonstration, and needs further education  HOME EXERCISE PROGRAM:  Access Code: [email or web address] URL: [email or web address] Date: 06/27/2024 Prepared by: Josette Rough  Exercises - Standing Hip Abduction with Resistance at Thighs  -  1 x daily - 5 x weekly - 2 sets - 10 reps - Tandem Stance in Corner  - 1 x daily - 7 x weekly - 2 sets - 6 reps - 15-30 seconds  hold - Standing Single Leg Stance with Counter Support  - 1 x daily - 7 x weekly - 2 sets - 6 reps - 15-30 seconds  hold - Supine Bridge with Resistance Band  - 1 x daily - 7 x weekly - 2 sets - 10 reps -  1 second  hold - Sidelying Hip Abduction with Resistance at Thighs  - 1 x daily - 7 x weekly - 2 sets - 10 reps - Bridge Walk Out  - 1 x daily - 7 x weekly - 2 sets - 10 reps     ASSESSMENT:  CLINICAL IMPRESSION:    Pt enters doing well with no pain. Again some improvement made towards goals. We worked on a healthy mix of balance and open chain strengthening. Progressed to outdoor ambulation without issue.  Will continue to progress as able/tolerated.    Patient is a 68 y.o. F who was seen today for physical therapy evaluation and treatment for  Diagnosis  R26.81 (ICD-10-CM) - Unsteadiness on feet  . Does show some severe hip abductor weakness as well as general balance impairment especially dynamically. Very sedentary and not too interested in getting to the gym. Will trial PT, if she is happy with the results of that period and making good progress, we can certainly consider extending POC at that point.   OBJECTIVE IMPAIRMENTS: Abnormal gait, decreased activity tolerance, decreased balance, decreased mobility, decreased strength, and obesity.   ACTIVITY LIMITATIONS: standing, stairs, transfers, and locomotion level  PARTICIPATION LIMITATIONS: driving, shopping, community activity, and yard work  PERSONAL FACTORS: Age, Behavior pattern, Education, Fitness, Past/current experiences, Social background, and Time since onset of injury/illness/exacerbation are also affecting patient's functional outcome.   REHAB POTENTIAL: Fair sedentary lifestyle, limited buy in to PT, chronicity of impairments   CLINICAL DECISION MAKING: Stable/uncomplicated  EVALUATION COMPLEXITY: Low   GOALS: Goals reviewed with patient? No  SHORT TERM GOALS: Target date: STGs = LTGs      LONG TERM GOALS: Target date: 07/06/2024    MMT in hip abductors to be at least 4/5 B Baseline:  Goal status: MET 06/24/24  2.  DGI to be at least 20 to show improved functional balance Baseline:  Goal status: Met  06/30/24   3. Will ambulate at least 574ft in with RPE no more than 3/10 to show improved activity tolerance and community access  Baseline:  Goal status: progressing 06/24/24, Met 07/04/24  4.  Will be able to stand for 30 minutes without difficulty  Baseline:  Goal status: subjectively reports 5-7 minutes, 7-10 min 07/04/24  5.  PSFS to have improved by 2 points  Baseline:  Goal status: INITIAL     PLAN:  PT FREQUENCY: 2x/week  PT DURATION: 4 weeks  PLANNED INTERVENTIONS: 97750- Physical Performance Testing, 97110-Therapeutic exercises, 97530- Therapeutic activity, [id number]- Neuromuscular re-education, 97535- Self Care, [id number]- Manual therapy, and [id number]- Gait training  PLAN FOR NEXT SESSION: progress strength, balance and gait. Progress standing tolerance as able while considering R hip pain, Possible D/C   Tanda Sorrow, PTA 07/04/24 2:29 PM

## 2024-07-06 ENCOUNTER — Ambulatory Visit: Admitting: Physical Therapy

## 2024-07-06 ENCOUNTER — Encounter: Payer: Self-pay | Admitting: Physical Therapy

## 2024-07-06 DIAGNOSIS — R262 Difficulty in walking, not elsewhere classified: Secondary | ICD-10-CM

## 2024-07-06 DIAGNOSIS — M6281 Muscle weakness (generalized): Secondary | ICD-10-CM

## 2024-07-06 DIAGNOSIS — R2681 Unsteadiness on feet: Secondary | ICD-10-CM | POA: Diagnosis not present

## 2024-07-06 NOTE — Therapy (Signed)
 OUTPATIENT PHYSICAL THERAPY LOWER EXTREMITY TREATMENT    Patient Name: Emma Bonilla MRN: [id number] DOB:1956-04-09, 68 y.o., female Today's Date: 07/06/2024  END OF SESSION:  PT End of Session - 07/06/24 1430     Visit Number 8    Date for PT Re-Evaluation 07/06/24    PT Start Time 1430    PT Stop Time 1515    PT Time Calculation (min) 45 min    Activity Tolerance Patient tolerated treatment well    Behavior During Therapy WFL for tasks assessed/performed           Past Medical History:  Diagnosis Date   Anemia    Arthritis    Depression    Diastolic dysfunction without heart failure    Family history of rectal cancer    GERD (gastroesophageal reflux disease)    Hepatic steatosis    a. seen on prior US .   Hyperlipidemia    Hypertension    Mild aortic regurgitation    Mild aortic stenosis    Premature atrial contractions    PVC's (premature ventricular contractions)    Thyroid  disease    TIA (transient ischemic attack)    2010   Tobacco abuse    Past Surgical History:  Procedure Laterality Date   CORONARY STENT INTERVENTION N/A 10/13/2017   Procedure: CORONARY STENT INTERVENTION;  Surgeon: Anner Alm ORN, MD;  Location: [facility] INVASIVE CV LAB;  Service: Cardiovascular;  Laterality: N/A;   DILATION AND CURETTAGE OF UTERUS     30 years ago   FOOT SURGERY     LEFT HEART CATH AND CORONARY ANGIOGRAPHY N/A 10/13/2017   Procedure: LEFT HEART CATH AND CORONARY ANGIOGRAPHY;  Surgeon: Anner Alm ORN, MD;  Location: [facility] INVASIVE CV LAB;  Service: Cardiovascular;  Laterality: N/A;   Patient Active Problem List   Diagnosis Date Noted   Irregular bowel habits 06/12/2022   Preventive measure 05/19/2022   Type 2 diabetes mellitus, controlled (HCC) 04/30/2022   Family history of rectal cancer 01/16/2022   Chronic insomnia 01/16/2022   Hepatic steatosis 04/30/2021   Hx of adenomatous colonic polyps 04/18/2021   Patellar subluxation 03/03/2021   Numbness and tingling in  both hands 04/23/2020   Obstructive sleep apnea hypopnea, moderate 06/20/2019   Atypical chest pain 04/17/2018   Obesity, Class II, BMI 35-39.9 10/13/2017   CAD S/P percutaneous coronary angioplasty 10/2017 10/12/2017   Iron deficiency anemia 03/27/2017   Lipoma 02/14/2017   Internal hemorrhoids 02/13/2017   TIA (transient ischemic attack) 08/21/2015   Aortic valve stenosis 06/11/2015   Dental caries 05/09/2013   Perennial allergic rhinitis 04/03/2012   Hypothyroidism 03/30/2012   Osteoarthritis 11/07/2011   Antiplatelet or antithrombotic long-term use 09/01/2011   Essential hypertension 07/25/2011   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   Gastroesophageal reflux disease 07/25/2011   Tobacco abuse 07/25/2011   Hyperlipidemia 07/25/2011    PCP: Haze Kingfisher, MD  REFERRING PROVIDER: Haze Kingfisher, MD  REFERRING DIAG:  Diagnosis  R26.81 (ICD-10-CM) - Unsteadiness on feet    THERAPY DIAG:  Unsteadiness on feet  Muscle weakness (generalized)  Difficulty in walking, not elsewhere classified  Rationale for Evaluation and Treatment: Rehabilitation  ONSET DATE: chronic, had TIA in 2010  SUBJECTIVE:   SUBJECTIVE STATEMENT:  A little headache, but its not that bad    Feel off balance, not sure why. Sometimes it comes from my hip giving out, sometimes I don't know why I feel unsteady. Hip has felt like it'll give out off and  on for the past couple of years. Have some lower back pain.   PERTINENT HISTORY: See above  PAIN:  Are you having pain? No  PRECAUTIONS: None  RED FLAGS: None   WEIGHT BEARING RESTRICTIONS: No  FALLS:  Has patient fallen in last 6 months? Yes. Number of falls 2- was just walking and fell   LIVING ENVIRONMENT: Lives with: lives with their family Lives in: House/apartment    OCCUPATION: retired- worked in [employer]   PLOF: Independent, Independent with basic ADLs, Independent with gait, and Independent  with transfers  PATIENT GOALS: I'm here because the doctor sent me   NEXT MD VISIT: Referring PRN   OBJECTIVE:  Note: Objective measures were completed at Evaluation unless otherwise noted.    PATIENT SURVEYS:    THE PATIENT SPECIFIC FUNCTIONAL SCALE  Place score of 0-10 (0 = unable to perform activity and 10 = able to perform activity at the same level as before injury or problem)  Activity Date: Eval 06/08/24   Standing more than 15-20 minutes  5   2.     3.    4.     Total Score 5     Total Score = Sum of activity scores/number of activities  Minimally Detectable Change: 3 points (for single activity); 2 points (for average score)  [facility] (nd). The Patient Specific Functional Scale . Retrieved from [email or web address]   COGNITION: Overall cognitive status: Within functional limits for tasks assessed     SENSATION: [facility]     LOWER EXTREMITY MMT:  MMT Right eval Left eval RT/Left 06/24/24  Hip flexion 4+ 4+ 4+ BIL  Hip extension     Hip abduction 3- seated  3- seated  4 BIL  Hip adduction     Hip internal rotation     Hip external rotation     Knee flexion 4+ 4+   Knee extension 4+ 4+   Ankle dorsiflexion 5 5   Ankle plantarflexion     Ankle inversion     Ankle eversion      (Blank rows = not tested)    FUNCTIONAL TESTS:  5 times sit to stand: 17.4 seconds with posterior LOB on last rep  3 minute walk test: 445ft  Dynamic Gait Index: Dynamic Gait Index  Mark the lowest level that applies.   Date Performed 06/08/24 (eval) 06/30/24  Gait level surface (3) Normal: walks 20', no AD, good speed, no evidence for imbalance, normal gait pattern 3  2. Change in gait speed (3) Normal: Able to smoothly change walking speed without loss of balance or gait deviation. Shows a significant difference in walking speeds between normal, fast and slow speeds  3  3. Gait with horizontal head turns (2)  Mild Impairment: Performs head turns smoothly with slight change in gait velocity, i.e., minor disruption to smooth gait path or uses walking aid 2  4. Gait with vertical head turns (2) Mild Impairment: Performs head turns smoothly with slight change in gait velocity, i.e., minor disruption to smooth gait path or uses walking aid 3  5. Gait and pivot turn (1) Moderate Impairment: Turns slowly, requires verbal cueing, requires several small steps to catch balance following turn and stop 2  6. Step over obstacle (2) Mild Impairment: Is able to step over box, but must slow down and adjust steps to clear box safely 2  7. Step around obstacle (2) Mild Impairment: Is able to step around both cones, but must  slow down and adjust steps to clear cones 2  8. Steps (2) Mild Impairment: Alternating feet, must use rail 3  Total score 17 20    Score Interpretation: Score of <19 indicates high risk of falls.  Minimally Clinically Important Difference (MCID):  =DGI scores of<21/24 = 1.80 points DGI scores of >21/24 = 0.60 points   Goodwin T, Inbar-Borovsky N, Brozgol M, Giladi N, [place] JM. The Dynamic Gait Index in healthy older adults: the role of stair climbing, fear of falling and gender. Gait Posture. 2009 Feb;29(2):237-41. doi: 10.1016/j.gaitpost.2008.08.013. Epub 2008 Oct 8. PMID: 81154560; PMCID: EFR7290501.  Pardasaney, MYRTIS LOIS Bonus, GEANNIE POUR., et al. (2012). Sensitivity to change and responsiveness of four balance measures for community-dwelling older adults. Physical therapy 92(3): 388-397.   GAIT: Distance walked: 438ft Assistive device utilized: None Level of assistance: Complete Independence Comments: mild unsteadiness noted when fatigued increased                                                                                                                                 TREATMENT DATE:  07/06/24 NuStep L 5 x 6 min 20lb resisted gait 4 way x 3 each 4in step ups 2x5 each HS curls  25lb 2x10 Leg Ext 10lb 2x10 Shoulder Ext green 2x10 Sit to stand LE on airex 2x10  07/04/24 NuStep L 5 x 6 min Gait around the front and R side back island, up and down slope, in grass, up and down curbs Sit to stand holding yellow ball  HS curls 25lb 2x12 Leg Ext 10lb 2x12 Shoulder Ext 5lb 2x10 6in step ups x 5 each  06/30/24 BP 102/77 NuStep L 5 x 6 min DGI 20/24 HS curls 25lb 2x10 Sit to stand LE on airex 2x10 Alt 6 in box taps form airex  Shoulder Ext 5lb 2x10   06/27/24  Nustep L5x8 minutes all four extremities, seat 9  Tandem stance 3x30 seconds B blue foam  Tandem walks solid surface x4 laps // bars  Side steps blue foam pad x 4 laps // bars   Bridge + ABD into green TB 2x15 Sidelying hip ABD into green TB 2x10 B Walking bridges x10       06/24/24 Nustep L 5 LE ony Standing with UE support 5# ankle wts alt LE 10x each then alteranate 10 x each- marching, SL hip flex,ext and abd- c/o RT hip pain/cramping 6 inch step tap with 5# ankle wts with UE support 10x BIL then alt 20x STS with yellow wt ball chest press 10 x Resisted gait 20 # fwd and back 5 x each,laterally 3 x each- back pain with backward walking  and side walking left hip discomfort Supine feet on ball bridge,KTC and obl Iso abdominals 10 x 3 sec hold Add ball squeeze bridge 10 x Bridge with clam 10x         PATIENT EDUCATION:  Education details: exam findings, POC, HEP  Person educated: Patient Education method: Explanation, Demonstration, and Handouts Education comprehension: verbalized understanding, returned demonstration, and needs further education  HOME EXERCISE PROGRAM:  Access Code: [email or web address] URL: [email or web address] Date: 06/27/2024 Prepared by: Josette Rough  Exercises - Standing Hip Abduction with Resistance at Thighs  - 1 x daily - 5 x weekly - 2 sets - 10 reps - Tandem Stance in Corner  - 1 x daily - 7 x weekly - 2 sets - 6 reps - 15-30 seconds   hold - Standing Single Leg Stance with Counter Support  - 1 x daily - 7 x weekly - 2 sets - 6 reps - 15-30 seconds  hold - Supine Bridge with Resistance Band  - 1 x daily - 7 x weekly - 2 sets - 10 reps - 1 second  hold - Sidelying Hip Abduction with Resistance at Thighs  - 1 x daily - 7 x weekly - 2 sets - 10 reps - Bridge Walk Out  - 1 x daily - 7 x weekly - 2 sets - 10 reps     ASSESSMENT:  CLINICAL IMPRESSION:    Again she enters doing well with no pain.  We worked on a healthy mix of balance and open chain strengthening. Pt reports being pleased with her current functional status with no limitations. She reports that she is good with being discharged    Patient is a 68 y.o. F who was seen today for physical therapy evaluation and treatment for  Diagnosis  R26.81 (ICD-10-CM) - Unsteadiness on feet  . Does show some severe hip abductor weakness as well as general balance impairment especially dynamically. Very sedentary and not too interested in getting to the gym. Will trial PT, if she is happy with the results of that period and making good progress, we can certainly consider extending POC at that point.   OBJECTIVE IMPAIRMENTS: Abnormal gait, decreased activity tolerance, decreased balance, decreased mobility, decreased strength, and obesity.   ACTIVITY LIMITATIONS: standing, stairs, transfers, and locomotion level  PARTICIPATION LIMITATIONS: driving, shopping, community activity, and yard work  PERSONAL FACTORS: Age, Behavior pattern, Education, Fitness, Past/current experiences, Social background, and Time since onset of injury/illness/exacerbation are also affecting patient's functional outcome.   REHAB POTENTIAL: Fair sedentary lifestyle, limited buy in to PT, chronicity of impairments   CLINICAL DECISION MAKING: Stable/uncomplicated  EVALUATION COMPLEXITY: Low   GOALS: Goals reviewed with patient? No  SHORT TERM GOALS: Target date: STGs = LTGs      LONG TERM  GOALS: Target date: 07/06/2024    MMT in hip abductors to be at least 4/5 B Baseline:  Goal status: MET 06/24/24  2.  DGI to be at least 20 to show improved functional balance Baseline:  Goal status: Met 06/30/24   3. Will ambulate at least 560ft in with RPE no more than 3/10 to show improved activity tolerance and community access  Baseline:  Goal status: progressing 06/24/24, Met 07/04/24  4.  Will be able to stand for 30 minutes without difficulty  Baseline:  Goal status: subjectively reports 5-7 minutes, 7-10 min 07/04/24  5.  PSFS to have improved by 2 points  Baseline:  Goal status: INITIAL     PLAN:  PT FREQUENCY: 2x/week  PT DURATION: 4 weeks  PLANNED INTERVENTIONS: 97750- Physical Performance Testing, 97110-Therapeutic exercises, 97530- Therapeutic activity, [id number]- Neuromuscular re-education, 97535- Self Care, [id number]- Manual therapy, and [id number]- Gait training  PLAN FOR NEXT SESSION: D/C  PHYSICAL THERAPY DISCHARGE SUMMARY  Visits from [facility]  of Care: 8    Patient agrees to discharge. Patient goals were partially met. Patient is being discharged due to being pleased with the current functional level.  Tanda Sorrow, PTA 07/06/24 2:32 PM

## 2024-09-20 ENCOUNTER — Other Ambulatory Visit: Payer: Self-pay | Admitting: Family Medicine

## 2024-09-20 DIAGNOSIS — N183 Chronic kidney disease, stage 3 unspecified: Secondary | ICD-10-CM

## 2024-09-27 ENCOUNTER — Ambulatory Visit
Admission: RE | Admit: 2024-09-27 | Discharge: 2024-09-27 | Disposition: A | Source: Ambulatory Visit | Attending: Family Medicine

## 2024-09-27 DIAGNOSIS — N183 Chronic kidney disease, stage 3 unspecified: Secondary | ICD-10-CM

## 2024-10-05 ENCOUNTER — Other Ambulatory Visit: Payer: Self-pay | Admitting: Family Medicine

## 2024-10-05 DIAGNOSIS — R519 Headache, unspecified: Secondary | ICD-10-CM

## 2024-10-11 ENCOUNTER — Ambulatory Visit
Admission: RE | Admit: 2024-10-11 | Discharge: 2024-10-11 | Disposition: A | Discharge status: Home / Self Care | Source: Ambulatory Visit | Attending: Family Medicine | Admitting: Family Medicine

## 2024-10-11 DIAGNOSIS — R519 Headache, unspecified: Secondary | ICD-10-CM

## 2024-11-30 ENCOUNTER — Ambulatory Visit
Admission: RE | Admit: 2024-11-30 | Discharge: 2024-11-30 | Disposition: A | Discharge status: Home / Self Care | Source: Ambulatory Visit | Attending: Family Medicine | Admitting: Family Medicine

## 2024-11-30 ENCOUNTER — Other Ambulatory Visit: Payer: Self-pay | Admitting: Family Medicine

## 2024-11-30 DIAGNOSIS — M25552 Pain in left hip: Secondary | ICD-10-CM

## 2025-04-03 ENCOUNTER — Other Ambulatory Visit (HOSPITAL_COMMUNITY)
